# Patient Record
Sex: Female | Born: 1980
Health system: Southern US, Community
[De-identification: ages and names within clinical notes are randomized; demographics above are authoritative.]

## PROBLEM LIST (undated history)

## (undated) DIAGNOSIS — B999 Unspecified infectious disease: Secondary | ICD-10-CM

## (undated) DIAGNOSIS — N83209 Unspecified ovarian cyst, unspecified side: Secondary | ICD-10-CM

## (undated) DIAGNOSIS — G8929 Other chronic pain: Secondary | ICD-10-CM

## (undated) DIAGNOSIS — I10 Essential (primary) hypertension: Secondary | ICD-10-CM

## (undated) DIAGNOSIS — I499 Cardiac arrhythmia, unspecified: Secondary | ICD-10-CM

## (undated) DIAGNOSIS — F32A Depression, unspecified: Secondary | ICD-10-CM

## (undated) DIAGNOSIS — O009 Unspecified ectopic pregnancy without intrauterine pregnancy: Secondary | ICD-10-CM

## (undated) DIAGNOSIS — I351 Nonrheumatic aortic (valve) insufficiency: Secondary | ICD-10-CM

## (undated) DIAGNOSIS — R51 Headache: Secondary | ICD-10-CM

## (undated) DIAGNOSIS — N390 Urinary tract infection, site not specified: Secondary | ICD-10-CM

## (undated) DIAGNOSIS — F419 Anxiety disorder, unspecified: Secondary | ICD-10-CM

## (undated) DIAGNOSIS — K219 Gastro-esophageal reflux disease without esophagitis: Secondary | ICD-10-CM

## (undated) DIAGNOSIS — J45909 Unspecified asthma, uncomplicated: Secondary | ICD-10-CM

## (undated) DIAGNOSIS — M549 Dorsalgia, unspecified: Secondary | ICD-10-CM

## (undated) DIAGNOSIS — R519 Headache, unspecified: Secondary | ICD-10-CM

## (undated) DIAGNOSIS — F329 Major depressive disorder, single episode, unspecified: Secondary | ICD-10-CM

## (undated) HISTORY — DX: Unspecified asthma, uncomplicated: J45.909

## (undated) HISTORY — PX: CLEFT PALATE REPAIR: SUR1165

## (undated) HISTORY — DX: Nonrheumatic aortic (valve) insufficiency: I35.1

## (undated) HISTORY — PX: OTHER SURGICAL HISTORY: SHX169

---

## 2009-12-05 HISTORY — PX: LAPAROSCOPY FOR ECTOPIC PREGNANCY: SUR765

## 2011-03-27 ENCOUNTER — Emergency Department (HOSPITAL_COMMUNITY): Payer: Medicaid Other

## 2011-03-27 ENCOUNTER — Emergency Department (HOSPITAL_COMMUNITY)
Admission: EM | Admit: 2011-03-27 | Discharge: 2011-03-27 | Disposition: A | Discharge status: Home / Self Care | Payer: Medicaid Other | Attending: Emergency Medicine | Admitting: Emergency Medicine

## 2011-03-27 DIAGNOSIS — O219 Vomiting of pregnancy, unspecified: Secondary | ICD-10-CM | POA: Insufficient documentation

## 2011-03-27 DIAGNOSIS — O209 Hemorrhage in early pregnancy, unspecified: Secondary | ICD-10-CM | POA: Insufficient documentation

## 2011-03-27 DIAGNOSIS — O99891 Other specified diseases and conditions complicating pregnancy: Secondary | ICD-10-CM | POA: Insufficient documentation

## 2011-03-27 DIAGNOSIS — R197 Diarrhea, unspecified: Secondary | ICD-10-CM | POA: Insufficient documentation

## 2011-03-27 DIAGNOSIS — R109 Unspecified abdominal pain: Secondary | ICD-10-CM | POA: Insufficient documentation

## 2011-03-27 LAB — CBC
MCH: 32.6 pg (ref 26.0–34.0)
MCHC: 36.2 g/dL — ABNORMAL HIGH (ref 30.0–36.0)
Platelets: 297 10*3/uL (ref 150–400)
RBC: 4.51 MIL/uL (ref 3.87–5.11)
RDW: 13.4 % (ref 11.5–15.5)

## 2011-03-27 LAB — DIFFERENTIAL
Basophils Relative: 0 % (ref 0–1)
Eosinophils Absolute: 0 10*3/uL (ref 0.0–0.7)
Monocytes Relative: 6 % (ref 3–12)
Neutrophils Relative %: 79 % — ABNORMAL HIGH (ref 43–77)

## 2011-03-27 LAB — ABO/RH: ABO/RH(D): O POS

## 2011-03-27 LAB — URINALYSIS, ROUTINE W REFLEX MICROSCOPIC
Bilirubin Urine: NEGATIVE
Ketones, ur: >80 mg/dL — AB
Nitrite: NEGATIVE
Protein, ur: NEGATIVE mg/dL
Specific Gravity, Urine: 1.026 (ref 1.005–1.030)
Urobilinogen, UA: 0.2 mg/dL (ref 0.0–1.0)

## 2011-03-27 LAB — WET PREP, GENITAL: Yeast Wet Prep HPF POC: NONE SEEN

## 2011-03-27 LAB — POCT PREGNANCY, URINE: Preg Test, Ur: POSITIVE

## 2011-03-27 LAB — HCG, QUANTITATIVE, PREGNANCY: hCG, Beta Chain, Quant, S: 445 m[IU]/mL — ABNORMAL HIGH (ref <5)

## 2011-03-27 LAB — POCT I-STAT, CHEM 8
Calcium, Ion: 1.14 mmol/L (ref 1.12–1.32)
HCT: 48 % — ABNORMAL HIGH (ref 36.0–46.0)
TCO2: 23 mmol/L (ref 0–100)

## 2011-03-28 ENCOUNTER — Ambulatory Visit (HOSPITAL_COMMUNITY)
Admission: AD | Admit: 2011-03-28 | Discharge: 2011-03-28 | Disposition: A | Discharge status: Home / Self Care | Payer: Medicaid Other | Source: Ambulatory Visit | Attending: Obstetrics and Gynecology | Admitting: Obstetrics and Gynecology

## 2011-03-28 ENCOUNTER — Inpatient Hospital Stay (HOSPITAL_COMMUNITY): Payer: Medicaid Other

## 2011-03-28 ENCOUNTER — Other Ambulatory Visit: Payer: Self-pay | Admitting: Obstetrics and Gynecology

## 2011-03-28 DIAGNOSIS — N83209 Unspecified ovarian cyst, unspecified side: Secondary | ICD-10-CM | POA: Insufficient documentation

## 2011-03-28 DIAGNOSIS — O00109 Unspecified tubal pregnancy without intrauterine pregnancy: Secondary | ICD-10-CM | POA: Insufficient documentation

## 2011-03-28 LAB — COMPREHENSIVE METABOLIC PANEL
Albumin: 4.3 g/dL (ref 3.5–5.2)
Alkaline Phosphatase: 54 U/L (ref 39–117)
BUN: 3 mg/dL — ABNORMAL LOW (ref 6–23)
Calcium: 9.4 mg/dL (ref 8.4–10.5)
Potassium: 3.6 mEq/L (ref 3.5–5.1)
Sodium: 135 mEq/L (ref 135–145)
Total Protein: 7.8 g/dL (ref 6.0–8.3)

## 2011-03-28 LAB — RAPID URINE DRUG SCREEN, HOSP PERFORMED
Amphetamines: NOT DETECTED
Barbiturates: NOT DETECTED
Benzodiazepines: NOT DETECTED
Cocaine: NOT DETECTED

## 2011-03-28 LAB — CBC
MCHC: 34.6 g/dL (ref 30.0–36.0)
MCV: 91.6 fL (ref 78.0–100.0)
Platelets: 299 10*3/uL (ref 150–400)
RDW: 13.6 % (ref 11.5–15.5)
WBC: 10.5 10*3/uL (ref 4.0–10.5)

## 2011-03-28 LAB — GC/CHLAMYDIA PROBE AMP, GENITAL: Chlamydia, DNA Probe: NEGATIVE

## 2011-04-19 NOTE — Op Note (Signed)
NAMEBRITTENY, [name] NO.:  [id number]  MEDICAL RECORD NO.:  [id number]           PATIENT TYPE:  O  LOCATION:  [facility]                          [facility]  PHYSICIAN:  Gerald Leitz, MD          DATE OF BIRTH:  01/28/1981  DATE OF PROCEDURE:  03/28/2011 DATE OF DISCHARGE:                              OPERATIVE REPORT   PREOPERATIVE DIAGNOSIS:  Suspected right ectopic pregnancy.  POSTOPERATIVE DIAGNOSIS:  Suspected right ectopic pregnancy plus right paraovarian cyst.  PROCEDURE:  Laparoscopic right salpingectomy and removal of the right paraovarian cyst.  SURGEON:  Gerald Leitz, MD  ASSISTANT:  Patsy Baltimore, MD  ANESTHESIA:  General.  FINDINGS:  Complex right paraovarian cyst and dilated right fallopian tube with suspected ectopic tissue.  SPECIMEN:  Suspect ectopic tissue from the right fallopian tube and right paraovarian cyst.  DISPOSITION OF SPECIMEN:  Pathology.  ESTIMATED BLOOD LOSS:  100 mL.  COMPLICATIONS:  None.  PROCEDURE IN DETAIL:  The patient was taken to the operating room where she was placed under general anesthesia.  She was placed in the dorsal lithotomy position.  She was prepped and draped in the usual sterile fashion.  Speculum was placed in the vaginal vault and the anterior lip of the cervix was grasped with single-tooth tenaculum and a uterine manipulator was replaced without difficult.  The tenaculum was removed. Foley catheter was placed.  Attention was turned to the abdomen where a 5-mm incision was made at the umbilicus after being injected with approximately 7 mL of 0.25% Marcaine plain.  A 5-mm trocar was placed under direct visualization.  Pneumoperitoneum was achieved with CO2 gas. The abdomen was examined with the findings noted above.  Attention was turned to the left lower quadrant where a 10-mm incision was made with a scalpel and a 10-mm trocar was placed under direct visualization. Attention was turned to the  right lower quadrant where 5-mm incision was made and a 5-mm trocar was placed under direct visualization.  The right fallopian tube was grasped with an atraumatic grasper and incised linearly with laparoscopic scissors using electrocautery.  The contents of the fallopian tube were removed with a grasper and sent to pathology. Hemostasis of the right fallopian tube was achieved with bipolar scissors as well as Kleppinger.  Attention was turned to the right paraovarian cyst which was excised at its base with Harmonic scalpel. It was then placed into an EndoCatch bag and removed through the 10-mm trocar.  The abdomen was copiously irrigated and all incisions were hemostatic.  The 10-mm trocar was removed under direct visualization followed by the 5-mm trocar which was in the right lower quadrant. Pneumoperitoneum was released and the umbilical trocar was removed under direct visualization.  The 10-mm incision site, the fascia was reapproximated with 0-Vicryl, skin was closed with 4-0 Vicryl, and the umbilical site was closed with 4-0 Vicryl.  Dermabond was placed over the 5-mm incision in the right lower quadrant as well as the umbilical incision and the 10-mm incision.  The uterine manipulator was removed. The patient was awakened from anesthesia, taken to recovery room, and awakened  in stable condition.  Sponge, lap, and needle counts were correct x2.     Gerald Leitz, MD     TC/MEDQ  D:  03/28/2011  T:  03/29/2011  Job:  [id number]  Electronically Signed by Gerald Leitz MD on 04/19/2011 09:56:41 AM

## 2011-08-17 ENCOUNTER — Emergency Department (HOSPITAL_COMMUNITY)
Admission: EM | Admit: 2011-08-17 | Discharge: 2011-08-17 | Disposition: A | Discharge status: Home / Self Care | Payer: Medicaid Other | Attending: Emergency Medicine | Admitting: Emergency Medicine

## 2011-08-17 DIAGNOSIS — R51 Headache: Secondary | ICD-10-CM | POA: Insufficient documentation

## 2011-08-17 DIAGNOSIS — B9789 Other viral agents as the cause of diseases classified elsewhere: Secondary | ICD-10-CM | POA: Insufficient documentation

## 2011-08-17 DIAGNOSIS — J069 Acute upper respiratory infection, unspecified: Secondary | ICD-10-CM | POA: Insufficient documentation

## 2011-08-17 DIAGNOSIS — R05 Cough: Secondary | ICD-10-CM | POA: Insufficient documentation

## 2011-08-17 DIAGNOSIS — R059 Cough, unspecified: Secondary | ICD-10-CM | POA: Insufficient documentation

## 2011-08-17 DIAGNOSIS — H9209 Otalgia, unspecified ear: Secondary | ICD-10-CM | POA: Insufficient documentation

## 2011-11-24 ENCOUNTER — Emergency Department (HOSPITAL_COMMUNITY)
Admission: EM | Admit: 2011-11-24 | Discharge: 2011-11-25 | Disposition: A | Discharge status: Home / Self Care | Payer: Medicaid Other | Attending: Emergency Medicine | Admitting: Emergency Medicine

## 2011-11-24 ENCOUNTER — Encounter: Payer: Self-pay | Admitting: Emergency Medicine

## 2011-11-24 DIAGNOSIS — R197 Diarrhea, unspecified: Secondary | ICD-10-CM | POA: Insufficient documentation

## 2011-11-24 DIAGNOSIS — R1110 Vomiting, unspecified: Secondary | ICD-10-CM | POA: Insufficient documentation

## 2011-11-24 DIAGNOSIS — K029 Dental caries, unspecified: Secondary | ICD-10-CM | POA: Insufficient documentation

## 2011-11-24 DIAGNOSIS — F17200 Nicotine dependence, unspecified, uncomplicated: Secondary | ICD-10-CM | POA: Insufficient documentation

## 2011-11-24 DIAGNOSIS — K047 Periapical abscess without sinus: Secondary | ICD-10-CM

## 2011-11-24 NOTE — ED Notes (Signed)
PT. REPORTS PERSISTENT VOMITTING WITH DIARRHEA ONSET TODAY , DENIES ABDOMINAL PAIN , CHILLS WITH NO FEVER .

## 2011-11-25 LAB — URINALYSIS, ROUTINE W REFLEX MICROSCOPIC
Bilirubin Urine: NEGATIVE
Glucose, UA: NEGATIVE mg/dL
Ketones, ur: NEGATIVE mg/dL
Nitrite: NEGATIVE
Specific Gravity, Urine: 1.025 (ref 1.005–1.030)
pH: 7.5 (ref 5.0–8.0)

## 2011-11-25 LAB — URINE MICROSCOPIC-ADD ON

## 2011-11-25 LAB — POCT PREGNANCY, URINE: Preg Test, Ur: NEGATIVE

## 2011-11-25 MED ORDER — ONDANSETRON HCL 4 MG/2ML IJ SOLN
4.0000 mg | Freq: Once | INTRAMUSCULAR | Status: AC
  Administered 2011-11-25: 4 mg via INTRAVENOUS

## 2011-11-25 MED ORDER — PENICILLIN V POTASSIUM 250 MG PO TABS: 250.0000 mg | ORAL_TABLET | Freq: Four times a day (QID) | ORAL | Status: AC

## 2011-11-25 MED ORDER — SODIUM CHLORIDE 0.9 % IV BOLUS (SEPSIS)
1000.0000 mL | Freq: Once | INTRAVENOUS | Status: AC
  Administered 2011-11-25: 1000 mL via INTRAVENOUS

## 2011-11-25 MED ORDER — ONDANSETRON HCL 4 MG PO TABS: 4.0000 mg | ORAL_TABLET | Freq: Four times a day (QID) | ORAL | Status: AC

## 2011-11-25 MED ORDER — ONDANSETRON HCL 4 MG/2ML IJ SOLN
4.0000 mg | Freq: Once | INTRAMUSCULAR | Status: AC
  Administered 2011-11-25: 4 mg via INTRAVENOUS
  Filled 2011-11-25: qty 2

## 2011-11-25 NOTE — ED Provider Notes (Addendum)
History     CSN: [id number]  Arrival date & time 11/24/11  2230   First MD Initiated Contact with Patient 11/25/11 0104      Chief Complaint  Patient presents with  . Emesis    (Consider location/radiation/quality/duration/timing/severity/associated sxs/prior treatment) Patient is a 30 y.o. female presenting with vomiting. The history is provided by the patient.  Emesis  This is a new problem. The current episode started 3 to 5 hours ago. The problem occurs more than 10 times per day. The problem has not changed since onset.The emesis has an appearance of stomach contents (Mild streaks of blood). There has been no fever. Associated symptoms include diarrhea. Pertinent negatives include no abdominal pain, no chills, no cough, no fever, no myalgias and no URI. Risk factors: No ill contacts or suspected bad food exposure.    History reviewed. No pertinent past medical history.  History reviewed. No pertinent past surgical history.  No family history on file.  History  Substance Use Topics  . Smoking status: Current Everyday Smoker  . Smokeless tobacco: Not on file  . Alcohol Use: Yes     OCCASIONAL    OB History    Grav Para Term Preterm Abortions TAB SAB Ect Mult Living                  Review of Systems  Constitutional: Negative for fever and chills.  Respiratory: Negative for cough.   Gastrointestinal: Positive for vomiting and diarrhea. Negative for abdominal pain.  Musculoskeletal: Negative for myalgias.  All other systems reviewed and are negative.    Allergies  Review of patient's allergies indicates no known allergies.  Home Medications   Current Outpatient Rx  Name Route Sig Dispense Refill  . OVER THE COUNTER MEDICATION Oral Take 1 tablet by mouth daily as needed. For reflux *over the counter acid reducer       BP 126/88  Pulse 82  Temp(Src) 97.9 F (36.6 C) (Oral)  Resp 16  SpO2 100%  LMP 11/20/2011  Physical Exam  Nursing note and vitals  reviewed. Constitutional: She is oriented to person, place, and time. She appears well-developed and well-nourished. No distress.  HENT:  Head: Normocephalic and atraumatic.  Mouth/Throat: Mucous membranes are dry. Dental abscesses and dental caries present.    Eyes: EOM are normal. Pupils are equal, round, and reactive to light.  Cardiovascular: Normal rate, regular rhythm, normal heart sounds and intact distal pulses.  Exam reveals no friction rub.   No murmur heard. Pulmonary/Chest: Effort normal and breath sounds normal. She has no wheezes. She has no rales.  Abdominal: Soft. Bowel sounds are normal. She exhibits no distension. There is no tenderness. There is no rebound and no guarding.  Musculoskeletal: Normal range of motion. She exhibits no tenderness.       No edema  Neurological: She is alert and oriented to person, place, and time. No cranial nerve deficit.  Skin: Skin is warm and dry. No rash noted.  Psychiatric: She has a normal mood and affect. Her behavior is normal.    ED Course  Procedures (including critical care time)  Labs Reviewed  URINALYSIS, ROUTINE W REFLEX MICROSCOPIC - Abnormal; Notable for the following:    APPearance CLOUDY (*)    Hgb urine dipstick MODERATE (*)    All other components within normal limits  URINE MICROSCOPIC-ADD ON - Abnormal; Notable for the following:    Squamous Epithelial / LPF FEW (*)    Bacteria, UA FEW (*)  All other components within normal limits  POCT PREGNANCY, URINE  POCT PREGNANCY, URINE   No results found.   No diagnosis found.    MDM  Pt with symptoms most consistent with a viral process with fever/vomitting/diarrhea.  Denies bad food exposure and recent travel out of the country.  No recent abx.  No hx concerning for GU pathology or kidney stones.  Pt is awake and alert on exam without peritoneal signs. After IV fluids and Zofran patient feeling better and tolerating by mouth's. Will discharge home. Patient  also has a right lower dental abscess and will cover with penicillin and refer to dentistry          Gwyneth Sprout, MD 11/25/11 1610  Gwyneth Sprout, MD 11/25/11 [phone]

## 2011-12-06 DIAGNOSIS — I499 Cardiac arrhythmia, unspecified: Secondary | ICD-10-CM

## 2011-12-06 HISTORY — DX: Cardiac arrhythmia, unspecified: I49.9

## 2012-01-31 ENCOUNTER — Emergency Department (HOSPITAL_COMMUNITY): Payer: Medicaid Other

## 2012-01-31 ENCOUNTER — Encounter (HOSPITAL_COMMUNITY): Payer: Self-pay

## 2012-01-31 ENCOUNTER — Emergency Department (HOSPITAL_COMMUNITY)
Admission: EM | Admit: 2012-01-31 | Discharge: 2012-01-31 | Disposition: A | Discharge status: Home / Self Care | Payer: Medicaid Other | Attending: Emergency Medicine | Admitting: Emergency Medicine

## 2012-01-31 DIAGNOSIS — R141 Gas pain: Secondary | ICD-10-CM | POA: Insufficient documentation

## 2012-01-31 DIAGNOSIS — R142 Eructation: Secondary | ICD-10-CM | POA: Insufficient documentation

## 2012-01-31 DIAGNOSIS — K044 Acute apical periodontitis of pulpal origin: Secondary | ICD-10-CM | POA: Insufficient documentation

## 2012-01-31 DIAGNOSIS — K047 Periapical abscess without sinus: Secondary | ICD-10-CM

## 2012-01-31 DIAGNOSIS — K439 Ventral hernia without obstruction or gangrene: Secondary | ICD-10-CM

## 2012-01-31 MED ORDER — TRAMADOL HCL 50 MG PO TABS: 50.0000 mg | ORAL_TABLET | Freq: Four times a day (QID) | ORAL | Status: AC | PRN

## 2012-01-31 MED ORDER — TRAMADOL HCL 50 MG PO TABS
50.0000 mg | ORAL_TABLET | Freq: Once | ORAL | Status: AC
  Administered 2012-01-31: 50 mg via ORAL
  Filled 2012-01-31: qty 1

## 2012-01-31 MED ORDER — PENICILLIN V POTASSIUM 500 MG PO TABS: 500.0000 mg | ORAL_TABLET | Freq: Four times a day (QID) | ORAL | Status: AC

## 2012-01-31 NOTE — ED Notes (Signed)
Pt also sts her belly has never been this distented LBM this morning. Passing flatus. No Nausea vomiting.

## 2012-01-31 NOTE — ED Notes (Signed)
Pt report dental pain x months with no relief from antibiotic that was prescribed here. Also c/o possible hernia, reports a nodule in her lower abdomen x 6 month to 1 year. No abd pain.

## 2012-01-31 NOTE — ED Provider Notes (Signed)
Medical screening examination/treatment/procedure(s) were performed by non-physician practitioner and as supervising physician I was immediately available for consultation/collaboration.  Flint Melter, MD 01/31/12 2212

## 2012-01-31 NOTE — Discharge Instructions (Signed)
Dental Caries  Tooth decay (dental caries, cavities) is the most common of all oral diseases. It occurs in all ages but is more common in children and young adults.  CAUSES  Bacteria in your mouth combine with foods (particularly sugars and starches) to produce plaque. Plaque is a substance that sticks to the hard surfaces of teeth. The bacteria in the plaque produce acids that attack the enamel of teeth. Repeated acid attacks dissolve the enamel and create holes in the teeth. Root surfaces of teeth may also get these holes.  Other contributing factors include:   Frequent snacking and drinking of cavity-producing foods and liquids.   Poor oral hygiene.   Dry mouth.   Substance abuse such as methamphetamine.   Broken or poor fitting dental restorations.   Eating disorders.   Gastroesophageal reflux disease (GERD).   Certain radiation treatments to the head and neck.  SYMPTOMS  At first, dental decay appears as white, chalky areas on the enamel. In this early stage, symptoms are seldom present. As the decay progresses, pits and holes may appear on the enamel surfaces. Progression of the decay will lead to softening of the hard layers of the tooth. At this point you may experience some pain or achy feeling after sweet, hot, or cold foods or drinks are consumed. If left untreated, the decay will reach the internal structures of the tooth and produce severe pain. Extensive dental treatment, such as root canal therapy, may be needed to save the tooth at this late stage of decay development.  DIAGNOSIS  Most cavities will be detected during regular check-ups. A thorough medical and dental history will be taken by the dentist. The dentist will use instruments to check the surfaces of your teeth for any breakdown or discoloration. Some dentists have special instruments, such as lasers, that detect tooth decay. Dental X-rays may also show some cavities that are not visible to the eye (such as between  the contact areas of the teeth). TREATMENT  Treatment involves removal of the tooth decay and replacement with a restorative material such as silver, gold, or composite (white) material. However, if the decay involves a large area of the tooth and there is little remaining healthy tooth structure, a cap (crown) will be fitted over the remaining structure. If the decay involves the center part of the tooth (pulp), root canal treatment will be needed before any type of dental restoration is placed. If the tooth is severely destroyed by the decay process, leaving the remaining tooth structures unrestorable, the tooth will need to be pulled (extracted). Some early tooth decay may be reversed by fluoride treatments and thorough brushing and flossing at home. PREVENTION   Eat healthy foods. Restrict the amount of sugary, starchy foods and liquids you consume. Avoid frequent snacking and drinking of unhealthy foods and liquids.   Sealants can help with prevention of cavities. Sealants are composite resins applied onto the biting surfaces of teeth at risk for decay. They smooth out the pits and grooves and prevent food from being trapped in them. This is done in early childhood before tooth decay has started.   Fluoride tablets may also be prescribed to children between 6 months and 10 years of age if your drinking water is not fluoridated. The fluoride absorbed by the tooth enamel makes teeth less susceptible to decay. Thorough daily cleaning with a toothbrush and dental floss is the best way to prevent cavities. Use of a fluoride toothpaste is highly recommended. Fluoride mouth rinses   may be used in specific cases.   Topical application of fluoride by your dentist is important in children.   Regular visits with a dentist for checkups and cleanings are also important.  SEEK IMMEDIATE DENTAL CARE IF:  You have a fever.   You develop redness and swelling of your face, jaw, or neck.   You develop swelling  around a tooth.   You are unable to open your mouth or cannot swallow.   You have severe pain uncontrolled by pain medicine.  Document Released: 08/13/2002 Document Revised: 08/03/2011 Document Reviewed: 04/28/2011 [facility] Patient Information 2012 [place], [place].         Hernia A hernia occurs when an internal organ pushes out through a weak spot in the abdominal wall. Hernias most commonly occur in the groin and around the navel. Hernias often can be pushed back into place (reduced). Most hernias tend to get worse over time. Some abdominal hernias can get stuck in the opening (irreducible or incarcerated hernia) and cannot be reduced. An irreducible abdominal hernia which is tightly squeezed into the opening is at risk for impaired blood supply (strangulated hernia). A strangulated hernia is a medical emergency. Because of the risk for an irreducible or strangulated hernia, surgery may be recommended to repair a hernia. CAUSES   Heavy lifting.   Prolonged coughing.   Straining to have a bowel movement.   A cut (incision) made during an abdominal surgery.  HOME CARE INSTRUCTIONS   Bed rest is not required. You may continue your normal activities.   Avoid lifting more than 10 pounds (4.5 kg) or straining.   Cough gently. If you are a smoker it is best to stop. Even the best hernia repair can break down with the continual strain of coughing. Even if you do not have your hernia repaired, a cough will continue to aggravate the problem.   Do not wear anything tight over your hernia. Do not try to keep it in with an outside bandage or truss. These can damage abdominal contents if they are trapped within the hernia sac.   Eat a normal diet.   Avoid constipation. Straining over long periods of time will increase hernia size and encourage breakdown of repairs. If you cannot do this with diet alone, stool softeners may be used.  SEEK IMMEDIATE MEDICAL CARE IF:   You have a fever.    You develop increasing abdominal pain.   You feel nauseous or vomit.   Your hernia is stuck outside the abdomen, looks discolored, feels hard, or is tender.   You have any changes in your bowel habits or in the hernia that are unusual for you.   You have increased pain or swelling around the hernia.   You cannot push the hernia back in place by applying gentle pressure while lying down.  MAKE SURE YOU:   Understand these instructions.   Will watch your condition.   Will get help right away if you are not doing well or get worse.  Document Released: 11/21/2005 Document Revised: 08/03/2011 Document Reviewed: 07/10/2008 [facility] Patient Information 2012 [place], [place].      RESOURCE GUIDE  Dental Problems  Patients with Medicaid: [facility] [phone] [address]  [address] Phone:  [phone]                                                  Phone:  [phone]  If unable to pay or uninsured, contact:  [facility] or [facility]. to become qualified for the adult dental clinic.  Chronic Pain Problems Contact [facility]  [phone] Patients need to be referred by their primary care doctor.  Insufficient Money for Medicine Contact [employer]:  call "211" or [facility] [phone].  No Primary Care Doctor Call [facility]  [phone] Other agencies that provide inexpensive medical care    [facility]  [phone]    [facility]  [phone]    [facility]  [phone]    [facility]  [phone]    [facility]  [phone]    [facility]  [phone]  Psychological Services [facility] Behavioral Health  [phone] [facility] Services  [phone] [facility] Mental Health   [phone] (emergency services 8575266122)  Substance Abuse Resources [facility]   [phone] [facility] [phone] [facility] [phone] Floydene Flock 5515778029 Residential & Outpatient Substance Abuse Program  [phone]  Abuse/Neglect [facility] Child Abuse Hotline [phone] 591-4746 [facility] Child Abuse Hotline (234)221-7613 (After Hours)  Emergency Shelter [facility] [phone]  Maternity Homes [facility] at the [employer] [facility] [place] [phone] [facility] [phone]  MRSA Hotline #:   470-149-5772    [facility] Resources  [facility]     [employer]                          [facility] Dept. [address] [place]                       [address]      [address]  Blondell Reveal Phone:  [phone]                                   Phone:  [phone]                 Phone:  [phone]  [facility] Mental Health Phone:  [phone]  [facility] Child Abuse Hotline 252-832-4443 [phone] (After Hours)

## 2012-01-31 NOTE — ED Provider Notes (Signed)
History     CSN: [id number]  Arrival date & time 01/31/12  1011   First MD Initiated Contact with Patient 01/31/12 1138      Chief Complaint  Patient presents with  . Dental Pain  . Hernia    (Consider location/radiation/quality/duration/timing/severity/associated sxs/prior treatment) The history is provided by the patient.  31 y/o F with cc of dental pain intermittent x several months, with current exacerbation starting several days ago. Pain to left lower dentition, constant, moderate intensity, sharp, non-radiating. No assoc fever, chills, ear pain, face swelling, neck pain, N/V. Tx in the past with abx with temporary resolution, but pt has been unable to see a dentist for definitive tx. Also c/o abd bloating for approx last 8 months. Some assoc constipation, last BM today and was small but normal for the last several months. Passing flatus. Denies assoc abdominal pain, N/V/D.  Does not think she is pregnant. There is an associated "spot" to the mid-abdomen that pt is concerned may be a hernia. No pain at time of examination. No prior eval or tx.  No past medical history on file.  Past Surgical History  Procedure Date  . Etopical surgery   . Cleft palate repair     No family history on file.  History  Substance Use Topics  . Smoking status: Current Everyday Smoker  . Smokeless tobacco: Not on file  . Alcohol Use: Yes     OCCASIONAL     Review of Systems 10 systems reviewed and are negative for acute change except as noted in the HPI.  Allergies  Review of patient's allergies indicates no known allergies.  Home Medications   Current Outpatient Rx  Name Route Sig Dispense Refill  . ACETAMINOPHEN 500 MG PO TABS Oral Take 1,000 mg by mouth every 6 (six) hours as needed. PAIN    . OVER THE COUNTER MEDICATION Oral Take 1 tablet by mouth daily as needed. For reflux *over the counter acid reducer       BP 137/82  Pulse 83  Temp(Src) 98.4 F (36.9 C) (Oral)  Resp 18   SpO2 99%  Physical Exam  Constitutional: She is oriented to person, place, and time. She appears well-developed and well-nourished. No distress.  HENT:  Head: Normocephalic and atraumatic.  Right Ear: External ear normal.  Left Ear: External ear normal.  Nose: Nose normal.  Mouth/Throat: Uvula is midline, oropharynx is clear and moist and mucous membranes are normal. Dental caries present. No dental abscesses or uvula swelling. No oropharyngeal exudate.       Generalized poor dentition with multiple teeth with dental caries. Bottom left 1st molar with tenderness to percussion, mild gingival erythema with edema, no abscess seen.  Eyes: Conjunctivae are normal. Pupils are equal, round, and reactive to light.  Neck: Normal range of motion. Neck supple.  Cardiovascular: Normal rate and regular rhythm.   Pulmonary/Chest: Effort normal. No respiratory distress.  Abdominal: Soft. Bowel sounds are normal. Distention: slight distension. There is no tenderness. There is no rebound and no guarding.    Musculoskeletal: She exhibits no edema and no tenderness.  Lymphadenopathy:    She has no cervical adenopathy.  Neurological: She is alert and oriented to person, place, and time.  Skin: Skin is warm and dry. No rash noted.  Psychiatric: She has a normal mood and affect.    ED Course  Procedures (including critical care time)  Labs Reviewed - No data to display Dg Abd Acute W/chest  01/31/2012  *  RADIOLOGY REPORT*  Clinical Data: Bloating, constipation  ACUTE ABDOMEN SERIES (ABDOMEN 2 VIEW & CHEST 1 VIEW)  Comparison: None.  Findings: The lungs are clear and somewhat hyperaerated.  There appears to be a variation of a right-sided aortic arch present. The stomach appears to be normally positioned in the left upper quadrant.  Mediastinal contours are normal.  The heart is within normal limits in size.  Supine and erect views of the abdomen show no bowel obstruction. No free air is seen.  No opaque  calculi are noted.  IMPRESSION:  1.  No active lung disease.  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 2.  No bowel obstruction.  No free air.  Original Report Authenticated By: Juline Patch, M.D.     Dx 1: Dental infection Dx 2: Ventral hernia   MDM  Dental pain. No abscess seen. Suspect early infection. No s/s ludwig angina. Will tx with PCN and give pain medication (ultram to avoid constipation worsening).  Abd bloating, chronic. No pain. Small reducible ventral hernia. Abd otherwise benign. Suspect weight gain as true cause of "bloating" as it is uniform across the mid-section. Neg pregnancy test. Cathlean Sauer reviewed, no acute findings.  Will d/c home.        Shaaron Adler, [place] 01/31/12 1345

## 2012-01-31 NOTE — Progress Notes (Signed)
CM spoke with pt who confirms she does not have a pcp but has "limited medicaid".  Discussed the importance of a pcp for f/u. Reviewed [facility] number to assist with finding Medicaid self pay provider close. Pt voiced understanding and appreciation of resources provided

## 2012-02-22 ENCOUNTER — Encounter (HOSPITAL_COMMUNITY): Payer: Self-pay | Admitting: Adult Health

## 2012-02-22 ENCOUNTER — Emergency Department (HOSPITAL_COMMUNITY)
Admission: EM | Admit: 2012-02-22 | Discharge: 2012-02-23 | Disposition: A | Discharge status: Home / Self Care | Payer: Medicaid Other | Attending: Emergency Medicine | Admitting: Emergency Medicine

## 2012-02-22 DIAGNOSIS — F17200 Nicotine dependence, unspecified, uncomplicated: Secondary | ICD-10-CM | POA: Insufficient documentation

## 2012-02-22 DIAGNOSIS — H6690 Otitis media, unspecified, unspecified ear: Secondary | ICD-10-CM | POA: Insufficient documentation

## 2012-02-22 NOTE — ED Notes (Signed)
C/o ear infection and drainage, inability to hear out of ear for one day.

## 2012-02-23 MED ORDER — OXYCODONE-ACETAMINOPHEN 5-325 MG PO TABS
2.0000 | ORAL_TABLET | Freq: Once | ORAL | Status: AC
  Administered 2012-02-23: 2 via ORAL
  Filled 2012-02-23: qty 2

## 2012-02-23 MED ORDER — OXYCODONE-ACETAMINOPHEN 5-325 MG PO TABS: 1.0000 | ORAL_TABLET | Freq: Four times a day (QID) | ORAL | Status: AC | PRN

## 2012-02-23 MED ORDER — AMOXICILLIN 500 MG PO CAPS
500.0000 mg | ORAL_CAPSULE | Freq: Once | ORAL | Status: AC
  Administered 2012-02-23: 500 mg via ORAL
  Filled 2012-02-23: qty 1

## 2012-02-23 MED ORDER — AMOXICILLIN 500 MG PO CAPS: 500.0000 mg | ORAL_CAPSULE | Freq: Three times a day (TID) | ORAL | Status: AC

## 2012-02-23 NOTE — Discharge Instructions (Signed)
Otitis Media, Adult  A middle ear infection is an infection in the space behind the eardrum. The medical name for this is "otitis media." It may happen after a common cold. It is caused by a germ that starts growing in that space. You may feel swollen glands in your neck on the side of the ear infection.  HOME CARE INSTRUCTIONS   · Take your medicine as directed until it is gone, even if you feel better after the first few days.  · Only take over-the-counter or prescription medicines for pain, discomfort, or fever as directed by your caregiver.  · Occasional use of a nasal decongestant a couple times per day may help with discomfort and help the eustachian tube to drain better.  Follow up with your caregiver in 10 to 14 days or as directed, to be certain that the infection has cleared. Not keeping the appointment could result in a chronic or permanent injury, pain, hearing loss and disability. If there is any problem keeping the appointment, you must call back to this facility for assistance.  SEEK IMMEDIATE MEDICAL CARE IF:   · You are not getting better in 2 to 3 days.  · You have pain that is not controlled with medication.  · You feel worse instead of better.  · You cannot use the medication as directed.  · You develop swelling, redness or pain around the ear or stiffness in your neck.  MAKE SURE YOU:   · Understand these instructions.  · Will watch your condition.  · Will get help right away if you are not doing well or get worse.  Document Released: 08/26/2004 Document Revised: 11/10/2011 Document Reviewed: 06/27/2008  [employer]® Patient Information ©2012 [employer].

## 2012-02-23 NOTE — ED Provider Notes (Signed)
History     CSN: [id number]  Arrival date & time 02/22/12  2042   First MD Initiated Contact with Patient 02/23/12 0102      Chief Complaint  Patient presents with  . Otitis Media    (Consider location/radiation/quality/duration/timing/severity/associated sxs/prior treatment) Patient is a 31 y.o. female presenting with ear pain. The history is provided by the patient.  Otalgia This is a new problem. The current episode started yesterday. There is pain in the right ear. The problem occurs constantly. The problem has been gradually worsening. There has been no fever. The pain is severe. Associated symptoms include hearing loss and sore throat. Pertinent negatives include no ear discharge.    History reviewed. No pertinent past medical history.  Past Surgical History  Procedure Date  . Etopical surgery   . Cleft palate repair     History reviewed. No pertinent family history.  History  Substance Use Topics  . Smoking status: Current Everyday Smoker  . Smokeless tobacco: Not on file  . Alcohol Use: Yes     OCCASIONAL    OB History    Grav Para Term Preterm Abortions TAB SAB Ect Mult Living                  Review of Systems  HENT: Positive for hearing loss, ear pain and sore throat. Negative for ear discharge.   All other systems reviewed and are negative.    Allergies  Review of patient's allergies indicates no known allergies.  Home Medications   Current Outpatient Rx  Name Route Sig Dispense Refill  . ACETAMINOPHEN 500 MG PO TABS Oral Take 1,000 mg by mouth every 6 (six) hours as needed. PAIN    . OVER THE COUNTER MEDICATION Oral Take 1 tablet by mouth daily as needed. For reflux *over the counter acid reducer       BP 130/95  Pulse 104  Temp 98.9 F (37.2 C)  Resp 18  SpO2 100%  LMP 01/26/2012  Physical Exam  Nursing note and vitals reviewed. Constitutional: She is oriented to person, place, and time. She appears well-developed and  well-nourished.  HENT:  Head: Normocephalic and atraumatic.  Left Ear: External ear normal.  Mouth/Throat: Oropharynx is clear and moist.       The right tm is markedly erythematous and swollen  Neck: Normal range of motion. Neck supple.  Neurological: She is alert and oriented to person, place, and time.  Skin: Skin is warm and dry.    ED Course  Procedures (including critical care time)  Labs Reviewed - No data to display No results found.   No diagnosis found.    MDM          Geoffery Lyons, MD 02/23/12 [phone]

## 2012-10-30 ENCOUNTER — Emergency Department (HOSPITAL_COMMUNITY)
Admission: EM | Admit: 2012-10-30 | Discharge: 2012-10-30 | Disposition: A | Discharge status: Home / Self Care | Payer: Medicaid Other | Attending: Emergency Medicine | Admitting: Emergency Medicine

## 2012-10-30 DIAGNOSIS — F17200 Nicotine dependence, unspecified, uncomplicated: Secondary | ICD-10-CM | POA: Insufficient documentation

## 2012-10-30 DIAGNOSIS — R220 Localized swelling, mass and lump, head: Secondary | ICD-10-CM | POA: Insufficient documentation

## 2012-10-30 DIAGNOSIS — Z87720 Personal history of (corrected) congenital malformations of eye: Secondary | ICD-10-CM | POA: Insufficient documentation

## 2012-10-30 DIAGNOSIS — R6884 Jaw pain: Secondary | ICD-10-CM | POA: Insufficient documentation

## 2012-10-30 DIAGNOSIS — K047 Periapical abscess without sinus: Secondary | ICD-10-CM | POA: Insufficient documentation

## 2012-10-30 MED ORDER — OXYCODONE-ACETAMINOPHEN 5-325 MG PO TABS
1.0000 | ORAL_TABLET | Freq: Once | ORAL | Status: AC
  Administered 2012-10-30: 1 via ORAL
  Filled 2012-10-30: qty 1

## 2012-10-30 MED ORDER — PENICILLIN V POTASSIUM 500 MG PO TABS: 500.0000 mg | ORAL_TABLET | Freq: Three times a day (TID) | ORAL | Status: DC

## 2012-10-30 MED ORDER — OXYCODONE-ACETAMINOPHEN 5-325 MG PO TABS: 1.0000 | ORAL_TABLET | ORAL | Status: DC | PRN

## 2012-10-30 MED ORDER — PENICILLIN V POTASSIUM 500 MG PO TABS
500.0000 mg | ORAL_TABLET | Freq: Once | ORAL | Status: AC
  Administered 2012-10-30: 500 mg via ORAL
  Filled 2012-10-30: qty 1

## 2012-10-30 NOTE — ED Provider Notes (Signed)
Medical screening examination/treatment/procedure(s) were performed by non-physician practitioner and as supervising physician I was immediately available for consultation/collaboration.  Coline Calkin, MD 10/30/12 2345 

## 2012-10-30 NOTE — ED Notes (Signed)
Pt states she has a ride home. 

## 2012-10-30 NOTE — ED Provider Notes (Signed)
History     CSN: [id number]  Arrival date & time 10/30/12  1741   First MD Initiated Contact with Patient 10/30/12 1744      Chief Complaint  Patient presents with  . Dental Pain    (Consider location/radiation/quality/duration/timing/severity/associated sxs/prior treatment) Patient is a 31 y.o. female presenting with tooth pain. The history is provided by the patient.  Dental PainThe primary symptoms include mouth pain. Primary symptoms do not include fever. The symptoms began 2 days ago. The symptoms are worsening. The symptoms are new.  Additional symptoms include: dental sensitivity to temperature, gum swelling, jaw pain and facial swelling.    No past medical history on file.  Past Surgical History  Procedure Date  . Etopical surgery   . Cleft palate repair     No family history on file.  History  Substance Use Topics  . Smoking status: Current Every Day Smoker  . Smokeless tobacco: Not on file  . Alcohol Use: Yes     Comment: OCCASIONAL    OB History    Grav Para Term Preterm Abortions TAB SAB Ect Mult Living                  Review of Systems  Constitutional: Negative for fever.  HENT: Positive for facial swelling and dental problem.   Gastrointestinal: Negative for nausea.    Allergies  Review of patient's allergies indicates no known allergies.  Home Medications   Current Outpatient Rx  Name  Route  Sig  Dispense  Refill  . ASPIRIN 500 MG PO TABS   Oral   Take 500 mg by mouth every 6 (six) hours as needed. Pain           BP 154/96  Pulse 100  Temp 98.8 F (37.1 C) (Oral)  Resp 17  SpO2 100%  Physical Exam  Constitutional: She is oriented to person, place, and time. She appears well-developed and well-nourished.  HENT:       Significant decay of lower rear left molar with adjacent swelling of gum and buccal surface. No pointing abscess or visualized drainage.   Neck: Normal range of motion.  Pulmonary/Chest: Effort normal.    Neurological: She is alert and oriented to person, place, and time.  Skin: Skin is warm and dry.    ED Course  Procedures (including critical care time)  Labs Reviewed - No data to display No results found.   No diagnosis found.  1. Dental abscess  MDM  Dental pain, suspect abscess.        Rodena Medin, PA-C 10/30/12 [phone]

## 2012-10-30 NOTE — ED Notes (Signed)
Pt states she has an infection inside her mouth on the L side. Pt states infection has been going on for a days. Pt states she a hole in her wisdom tooth on the L side of her mouth. Pt with no acute distress. Pt states she does not have dental coverage at present.

## 2012-12-15 ENCOUNTER — Inpatient Hospital Stay (HOSPITAL_COMMUNITY)
Admission: AD | Admit: 2012-12-15 | Discharge: 2012-12-15 | Disposition: A | Discharge status: Home / Self Care | Payer: Medicaid Other | Source: Ambulatory Visit | Attending: Obstetrics & Gynecology | Admitting: Obstetrics & Gynecology

## 2012-12-15 ENCOUNTER — Encounter (HOSPITAL_COMMUNITY): Payer: Self-pay | Admitting: Emergency Medicine

## 2012-12-15 ENCOUNTER — Encounter (HOSPITAL_COMMUNITY): Payer: Self-pay | Admitting: *Deleted

## 2012-12-15 ENCOUNTER — Emergency Department (HOSPITAL_COMMUNITY)
Admission: EM | Admit: 2012-12-15 | Discharge: 2012-12-15 | Disposition: A | Discharge status: Home / Self Care | Payer: Medicaid Other | Attending: Emergency Medicine | Admitting: Emergency Medicine

## 2012-12-15 DIAGNOSIS — X500XXA Overexertion from strenuous movement or load, initial encounter: Secondary | ICD-10-CM | POA: Insufficient documentation

## 2012-12-15 DIAGNOSIS — N912 Amenorrhea, unspecified: Secondary | ICD-10-CM

## 2012-12-15 DIAGNOSIS — Y92009 Unspecified place in unspecified non-institutional (private) residence as the place of occurrence of the external cause: Secondary | ICD-10-CM | POA: Insufficient documentation

## 2012-12-15 DIAGNOSIS — S39012A Strain of muscle, fascia and tendon of lower back, initial encounter: Secondary | ICD-10-CM

## 2012-12-15 DIAGNOSIS — N760 Acute vaginitis: Secondary | ICD-10-CM | POA: Insufficient documentation

## 2012-12-15 DIAGNOSIS — Z8742 Personal history of other diseases of the female genital tract: Secondary | ICD-10-CM | POA: Insufficient documentation

## 2012-12-15 DIAGNOSIS — Y9389 Activity, other specified: Secondary | ICD-10-CM | POA: Insufficient documentation

## 2012-12-15 DIAGNOSIS — N910 Primary amenorrhea: Secondary | ICD-10-CM

## 2012-12-15 DIAGNOSIS — A499 Bacterial infection, unspecified: Secondary | ICD-10-CM | POA: Insufficient documentation

## 2012-12-15 DIAGNOSIS — R6883 Chills (without fever): Secondary | ICD-10-CM | POA: Insufficient documentation

## 2012-12-15 DIAGNOSIS — X503XXA Overexertion from repetitive movements, initial encounter: Secondary | ICD-10-CM | POA: Insufficient documentation

## 2012-12-15 DIAGNOSIS — S335XXA Sprain of ligaments of lumbar spine, initial encounter: Secondary | ICD-10-CM | POA: Insufficient documentation

## 2012-12-15 DIAGNOSIS — Z87440 Personal history of urinary (tract) infections: Secondary | ICD-10-CM | POA: Insufficient documentation

## 2012-12-15 DIAGNOSIS — B9689 Other specified bacterial agents as the cause of diseases classified elsewhere: Secondary | ICD-10-CM

## 2012-12-15 DIAGNOSIS — I10 Essential (primary) hypertension: Secondary | ICD-10-CM | POA: Insufficient documentation

## 2012-12-15 DIAGNOSIS — F17200 Nicotine dependence, unspecified, uncomplicated: Secondary | ICD-10-CM | POA: Insufficient documentation

## 2012-12-15 DIAGNOSIS — G479 Sleep disorder, unspecified: Secondary | ICD-10-CM | POA: Insufficient documentation

## 2012-12-15 HISTORY — DX: Urinary tract infection, site not specified: N39.0

## 2012-12-15 HISTORY — DX: Unspecified ectopic pregnancy without intrauterine pregnancy: O00.90

## 2012-12-15 HISTORY — DX: Essential (primary) hypertension: I10

## 2012-12-15 HISTORY — DX: Unspecified ovarian cyst, unspecified side: N83.209

## 2012-12-15 LAB — WET PREP, GENITAL
Trich, Wet Prep: NONE SEEN
Yeast Wet Prep HPF POC: NONE SEEN

## 2012-12-15 MED ORDER — HYDROCODONE-ACETAMINOPHEN 5-325 MG PO TABS: 1.0000 | ORAL_TABLET | Freq: Four times a day (QID) | ORAL | Status: DC | PRN

## 2012-12-15 MED ORDER — DIAZEPAM 5 MG PO TABS: 5.0000 mg | ORAL_TABLET | Freq: Two times a day (BID) | ORAL | Status: DC

## 2012-12-15 MED ORDER — METRONIDAZOLE 500 MG PO TABS: 500.0000 mg | ORAL_TABLET | Freq: Three times a day (TID) | ORAL | Status: DC

## 2012-12-15 MED ORDER — METRONIDAZOLE 1 % EX GEL: Freq: Every day | CUTANEOUS | Status: DC

## 2012-12-15 MED ORDER — NAPROXEN 500 MG PO TABS: 500.0000 mg | ORAL_TABLET | Freq: Two times a day (BID) | ORAL | Status: DC

## 2012-12-15 NOTE — ED Notes (Signed)
Pt states she has a ride home. 

## 2012-12-15 NOTE — ED Notes (Addendum)
Pt ambulatory to exam room with steady gait. Pt reports low back pain primarily on R side. Pt denies recent trauma to back.

## 2012-12-15 NOTE — MAU Note (Signed)
Pt has not had period since 10/15. Pt stated took HPT first was positive and the second one was negative. Denies pain or cramping.

## 2012-12-15 NOTE — ED Notes (Addendum)
Pt c/o low back pain x 1 month.  Pain 9/10.

## 2012-12-15 NOTE — ED Provider Notes (Signed)
History  This chart was scribed for non-physician practitioner working with Raeford Razor, MD by Erskine Emery, ED Scribe. This patient was seen in room WTR5/WTR5 and the patient's care was started at 16:22.   CSN: [id number]  Arrival date & time 12/15/12  1516   First MD Initiated Contact with Patient 12/15/12 1622      Chief Complaint  Patient presents with  . Back Pain    (Consider location/radiation/quality/duration/timing/severity/associated sxs/prior Treatment) [name] is a 32 y.o. female who presents to the Emergency Department complaining of gradually worsening intermittent lower back pain for the past couple weeks that has been constant for the past several days. Patient is a 32 y.o. female presenting with back pain. The history is provided by the patient. No language interpreter was used.  Back Pain  This is a recurrent problem. The current episode started more than 1 week ago. The problem occurs constantly. The problem has been gradually worsening. The pain is associated with lifting heavy objects. The pain is present in the lumbar spine. The pain radiates to the right thigh and right knee. The pain is moderate. The symptoms are aggravated by bending and certain positions. The pain is worse during the night. Associated symptoms include leg pain (right) and tingling (intermittent in right leg). Pertinent negatives include no fever, no numbness, no abdominal pain, no bowel incontinence, no perianal numbness, no bladder incontinence and no weakness. She has tried nothing for the symptoms.  Pt reports some associated chills intermittent tingling in the right leg (not currently), and difficulty sitting in one position for too long but she denies any numbness, fever, or bowel or bladder incontinence. Pt reports she sometimes lifts heavy things around the house. Pt reports the pain is aggravated by laying down and bending over. She hasn't taken anything for the pain. Pt reports a  family h/o cancer but no personal h/o cancer or IV drug use.  Past Medical History  Diagnosis Date  . Ectopic pregnancy   . Urinary tract infection   . Ovarian cyst   . Hypertension     was told, but never on meds    Past Surgical History  Procedure Date  . Etopical surgery   . Cleft palate repair   . Laparoscopy for ectopic pregnancy 2011    Family History  Problem Relation Age of Onset  . Other Neg Hx     History  Substance Use Topics  . Smoking status: Current Every Day Smoker -- 0.2 packs/day for 20 years    Types: Cigarettes  . Smokeless tobacco: Never Used  . Alcohol Use: Yes     Comment: OCCASIONAL    OB History    Grav Para Term Preterm Abortions TAB SAB Ect Mult Living   2    2 0 1 1 0 0      Review of Systems  Constitutional: Positive for chills. Negative for fever and diaphoresis.  Respiratory: Negative for shortness of breath.   Cardiovascular: Negative for leg swelling.  Gastrointestinal: Negative for nausea, vomiting, abdominal pain and bowel incontinence.  Genitourinary: Negative for bladder incontinence.  Musculoskeletal: Positive for back pain.  Skin: Negative for wound.  Neurological: Positive for tingling (intermittent in right leg). Negative for weakness and numbness.  Psychiatric/Behavioral: Positive for sleep disturbance.    Allergies  Review of patient's allergies indicates no known allergies.  Home Medications   Current Outpatient Rx  Name  Route  Sig  Dispense  Refill  . METRONIDAZOLE 1 %  EX GEL   Topical   Apply topically daily.   45 g   0     Triage Vitals: BP 120/79  Pulse 89  Temp 98 F (36.7 C) (Oral)  SpO2 100%  LMP 09/18/2012  Physical Exam  Nursing note and vitals reviewed. Constitutional: She is oriented to person, place, and time. She appears well-developed and well-nourished. No distress.  HENT:  Head: Normocephalic and atraumatic.  Eyes: EOM are normal. Pupils are equal, round, and reactive to light.    Neck: Neck supple. No tracheal deviation present.  Cardiovascular: Normal rate, regular rhythm and normal heart sounds.   Pulmonary/Chest: Effort normal and breath sounds normal. No respiratory distress. She has no wheezes.  Abdominal: Soft. She exhibits no distension.  Musculoskeletal: Normal range of motion. She exhibits no edema.       Tenderness to right and left side of lumbar spine. Paraspinal muscles are tender to palpation as well. Patellar and achilles reflexes are both 2+ bilaterally. Good muscle strength. Normal gait. Distal sensation is intact.  Neurological: She is alert and oriented to person, place, and time. She has normal strength. No sensory deficit. Gait normal.  Skin: Skin is warm and dry.  Psychiatric: She has a normal mood and affect.    ED Course  Procedures (including critical care time) DIAGNOSTIC STUDIES: Oxygen Saturation is 100% on room air, normal by my interpretation.    COORDINATION OF CARE: 17:01--I evaluated the patient and we discussed a treatment plan including pain medication, muscle relaxers, antiinflammatories, and alternating between heat and ice, to which the pt agreed. I notified the pt not to drive or use heavy machinery when using the pain medication.  Labs Reviewed - No data to display No results found.   No diagnosis found.    MDM  Patient with back pain.  Suspect muscular strain.  No neurological deficits and normal neuro exam.  Patient can walk but states is painful.  No loss of bowel or bladder control.  No concern for cauda equina.  No fever, night sweats, weight loss, h/o cancer, IVDU.  RICE protocol and pain medicine indicated and discussed with patient.   I personally performed the services described in this documentation, which was scribed in my presence. The recorded information has been reviewed and is accurate.    Pascal Lux [place], PA-C 12/15/12 2018

## 2012-12-15 NOTE — MAU Provider Note (Signed)
Attestation of Attending Supervision of Advanced Practitioner (PA/CNM/NP): Evaluation and management procedures were performed by the Advanced Practitioner under my supervision and collaboration.  I have reviewed the Advanced Practitioner's note and chart, and I agree with the management and plan.  Maverick Dieudonne, MD, FACOG Attending Obstetrician & Gynecologist Faculty Practice, [facility]  

## 2012-12-15 NOTE — MAU Note (Addendum)
No cycle since Oct, did 2 test, one faintly positve, one neg.  Has skipped a month before, but never gone this long.

## 2012-12-15 NOTE — MAU Provider Note (Signed)
CC: Possible Pregnancy    First Provider Initiated Contact with Patient 12/15/12 1236      HPI [name] is a 32 y.o. G2P0020 With amenorrhea for about 10 weekssince the end of October. She states menses are usually regular every month with moderate flow, however in the past she has skipped a month on occasion when she is "stressed out."  Denies depression or significant lfke change. She is in a mutually monogamous relationship and would like to become pregnant. No contraception. She does endorse some hair thinning in the center and cold temperature intolerance. LMP 09/18/13. Denies metrorrhagia. Denies abnormal vaginal discharge or vaginal irritation.   Past Medical History  Diagnosis Date  . Ectopic pregnancy   . Urinary tract infection   . Ovarian cyst   . Hypertension     was told, but never on meds    OB History    Grav Para Term Preterm Abortions TAB SAB Ect Mult Living   2    2 0 1 1 0 0     # Outc Date GA Lbr Len/2nd Wgt Sex Del Anes PTL Lv   1 SAB            2 ECT               Past Surgical History  Procedure Date  . Etopical surgery   . Cleft palate repair   . Laparoscopy for ectopic pregnancy 2011    History   Social History  . Marital Status: Single    Spouse Name: N/A    Number of Children: N/A  . Years of Education: N/A   Occupational History  . Not on file.   Social History Main Topics  . Smoking status: Current Every Day Smoker -- 0.2 packs/day for 20 years    Types: Cigarettes  . Smokeless tobacco: Never Used  . Alcohol Use: Yes     Comment: OCCASIONAL  . Drug Use: No  . Sexually Active: Yes    Birth Control/ Protection: None   Other Topics Concern  . Not on file   Social History Narrative  . No narrative on file    No current facility-administered medications on file prior to encounter.   Current Outpatient Prescriptions on File Prior to Encounter  Medication Sig Dispense Refill  . aspirin 500 MG tablet Take 500 mg by  mouth every 6 (six) hours as needed. Pain      . oxyCODONE-acetaminophen (PERCOCET/ROXICET) 5-325 MG per tablet Take 1 tablet by mouth every 4 (four) hours as needed for pain.  15 tablet  0  . penicillin v potassium (VEETID) 500 MG tablet Take 1 tablet (500 mg total) by mouth 3 (three) times daily.  30 tablet  0    No Known Allergies  ROS Pertinent items in HPI  PHYSICAL EXAM Filed Vitals:   12/15/12 1144  BP: 135/86  Pulse: 71  Temp: 98.5 F (36.9 C)  Resp: 18   General: Well nourished, well developed female in no acute distress Skin: no hirsutism Neck: thyroid ULNS, no mass Cardiovascular: Normal rate Respiratory: Normal effort Abdomen: Soft, nontender, no organomegaly Back: No CVAT Extremities: No edema Neurologic: Alert and oriented Speculum exam: NEFG; vagina with physiologic discharge, no blood; cervix clean Bimanual exam: cervix closed, no CMT; uterus NSSP; no adnexal tenderness or masses  LAB RESULTS Results for orders placed during the hospital encounter of 12/15/12 (from the past 24 hour(s))  POCT PREGNANCY, URINE     Status:  Normal   Collection Time   12/15/12 11:58 AM      Component Value Range   Preg Test, Ur NEGATIVE  NEGATIVE  WET PREP, GENITAL     Status: Abnormal   Collection Time   12/15/12  1:30 PM      Component Value Range   Yeast Wet Prep HPF POC NONE SEEN  NONE SEEN   Trich, Wet Prep NONE SEEN  NONE SEEN   Clue Cells Wet Prep HPF POC MODERATE (*) NONE SEEN   WBC, Wet Prep HPF POC MODERATE (*) NONE SEEN    IMAGING No results found.  MAU COURSE TSH sent ASSESSMENT  1. Primary amenorrhea   2. BV (bacterial vaginosis)     PLAN Discharge home. See AVS for patient education. D/W Dr. Macon Large  Follow-up Information    Please follow up. (Someone from [facility] hospital GYN clinic will call you at an appointment)           Medication List     As of 12/15/2012  2:09 PM    TAKE these medications         aspirin 500 MG tablet   Take 500  mg by mouth every 6 (six) hours as needed. Pain      metroNIDAZOLE 1 % gel   Commonly known as: METROGEL   Apply topically daily.      oxyCODONE-acetaminophen 5-325 MG per tablet   Commonly known as: PERCOCET/ROXICET   Take 1 tablet by mouth every 4 (four) hours as needed for pain.      penicillin v potassium 500 MG tablet   Commonly known as: VEETID   Take 1 tablet (500 mg total) by mouth 3 (three) times daily.            Danae Orleans, CNM 12/15/2012 12:46 PM

## 2012-12-17 LAB — GC/CHLAMYDIA PROBE AMP: CT Probe RNA: NEGATIVE

## 2012-12-17 NOTE — ED Provider Notes (Signed)
Medical screening examination/treatment/procedure(s) were performed by non-physician practitioner and as supervising physician I was immediately available for consultation/collaboration.  Jahayra Mazo, MD 12/17/12 2104 

## 2012-12-31 ENCOUNTER — Encounter: Payer: Medicaid Other | Admitting: Advanced Practice Midwife

## 2013-01-10 ENCOUNTER — Encounter: Payer: Medicaid Other | Admitting: Obstetrics & Gynecology

## 2013-01-13 ENCOUNTER — Encounter (HOSPITAL_COMMUNITY): Payer: Self-pay | Admitting: Physical Medicine and Rehabilitation

## 2013-01-13 ENCOUNTER — Inpatient Hospital Stay (HOSPITAL_COMMUNITY)
Admission: EM | Admit: 2013-01-13 | Discharge: 2013-01-14 | DRG: 310 | Disposition: A | Discharge status: Home / Self Care | Payer: Medicaid Other | Attending: Internal Medicine | Admitting: Internal Medicine

## 2013-01-13 ENCOUNTER — Observation Stay (HOSPITAL_COMMUNITY): Payer: Medicaid Other

## 2013-01-13 DIAGNOSIS — R112 Nausea with vomiting, unspecified: Secondary | ICD-10-CM | POA: Diagnosis present

## 2013-01-13 DIAGNOSIS — I10 Essential (primary) hypertension: Secondary | ICD-10-CM

## 2013-01-13 DIAGNOSIS — F1020 Alcohol dependence, uncomplicated: Secondary | ICD-10-CM | POA: Diagnosis present

## 2013-01-13 DIAGNOSIS — F17200 Nicotine dependence, unspecified, uncomplicated: Secondary | ICD-10-CM | POA: Diagnosis present

## 2013-01-13 DIAGNOSIS — E876 Hypokalemia: Secondary | ICD-10-CM | POA: Diagnosis present

## 2013-01-13 DIAGNOSIS — D72829 Elevated white blood cell count, unspecified: Secondary | ICD-10-CM | POA: Diagnosis present

## 2013-01-13 DIAGNOSIS — I4891 Unspecified atrial fibrillation: Principal | ICD-10-CM | POA: Diagnosis present

## 2013-01-13 DIAGNOSIS — I480 Paroxysmal atrial fibrillation: Secondary | ICD-10-CM | POA: Diagnosis present

## 2013-01-13 LAB — CBC WITH DIFFERENTIAL/PLATELET
Basophils Absolute: 0.1 10*3/uL (ref 0.0–0.1)
Basophils Relative: 0 % (ref 0–1)
Eosinophils Absolute: 0 10*3/uL (ref 0.0–0.7)
Eosinophils Relative: 0 % (ref 0–5)
HCT: 43.6 % (ref 36.0–46.0)
Hemoglobin: 15.8 g/dL — ABNORMAL HIGH (ref 12.0–15.0)
Lymphocytes Relative: 10 % — ABNORMAL LOW (ref 12–46)
Lymphs Abs: 1.3 10*3/uL (ref 0.7–4.0)
MCH: 32 pg (ref 26.0–34.0)
MCHC: 36.2 g/dL — ABNORMAL HIGH (ref 30.0–36.0)
MCV: 88.3 fL (ref 78.0–100.0)
Monocytes Absolute: 0.9 10*3/uL (ref 0.1–1.0)
Monocytes Relative: 7 % (ref 3–12)
Neutro Abs: 10.5 10*3/uL — ABNORMAL HIGH (ref 1.7–7.7)
Neutrophils Relative %: 82 % — ABNORMAL HIGH (ref 43–77)
Platelets: 299 10*3/uL (ref 150–400)
RBC: 4.94 MIL/uL (ref 3.87–5.11)
RDW: 13.5 % (ref 11.5–15.5)
WBC: 12.8 10*3/uL — ABNORMAL HIGH (ref 4.0–10.5)

## 2013-01-13 LAB — RAPID URINE DRUG SCREEN, HOSP PERFORMED
Amphetamines: NOT DETECTED
Barbiturates: NOT DETECTED
Benzodiazepines: NOT DETECTED
Cocaine: NOT DETECTED
Opiates: POSITIVE — AB
Tetrahydrocannabinol: NOT DETECTED

## 2013-01-13 LAB — COMPREHENSIVE METABOLIC PANEL
ALT: 41 U/L — ABNORMAL HIGH (ref 0–35)
AST: 27 U/L (ref 0–37)
Albumin: 4.6 g/dL (ref 3.5–5.2)
Alkaline Phosphatase: 70 U/L (ref 39–117)
BUN: 7 mg/dL (ref 6–23)
CO2: 24 mEq/L (ref 19–32)
Calcium: 9.8 mg/dL (ref 8.4–10.5)
Chloride: 103 mEq/L (ref 96–112)
Creatinine, Ser: 0.41 mg/dL — ABNORMAL LOW (ref 0.50–1.10)
GFR calc Af Amer: >90 mL/min (ref >90)
GFR calc non Af Amer: >90 mL/min (ref >90)
Glucose, Bld: 98 mg/dL (ref 70–99)
Potassium: 3.8 mEq/L (ref 3.5–5.1)
Sodium: 140 mEq/L (ref 135–145)
Total Bilirubin: 0.7 mg/dL (ref 0.3–1.2)
Total Protein: 8.4 g/dL — ABNORMAL HIGH (ref 6.0–8.3)

## 2013-01-13 LAB — CBC
HCT: 42.7 % (ref 36.0–46.0)
Hemoglobin: 14.9 g/dL (ref 12.0–15.0)
MCH: 31.4 pg (ref 26.0–34.0)
MCHC: 34.9 g/dL (ref 30.0–36.0)
MCV: 90.1 fL (ref 78.0–100.0)
Platelets: 297 K/uL (ref 150–400)
RBC: 4.74 MIL/uL (ref 3.87–5.11)
RDW: 13.8 % (ref 11.5–15.5)
WBC: 10.9 K/uL — ABNORMAL HIGH (ref 4.0–10.5)

## 2013-01-13 LAB — URINE MICROSCOPIC-ADD ON

## 2013-01-13 LAB — URINALYSIS, ROUTINE W REFLEX MICROSCOPIC
Bilirubin Urine: NEGATIVE
Glucose, UA: NEGATIVE mg/dL
Hgb urine dipstick: NEGATIVE
Ketones, ur: 15 mg/dL — AB
Leukocytes, UA: NEGATIVE
Nitrite: NEGATIVE
Protein, ur: 30 mg/dL — AB
Specific Gravity, Urine: 1.025 (ref 1.005–1.030)
Urobilinogen, UA: 0.2 mg/dL (ref 0.0–1.0)
pH: 7 (ref 5.0–8.0)

## 2013-01-13 LAB — POCT PREGNANCY, URINE: Preg Test, Ur: NEGATIVE

## 2013-01-13 LAB — PREGNANCY, URINE: Preg Test, Ur: NEGATIVE

## 2013-01-13 LAB — TROPONIN I
Troponin I: <0.3 ng/mL (ref <0.30)
Troponin I: <0.3 ng/mL

## 2013-01-13 LAB — LIPASE, BLOOD: Lipase: 49 U/L (ref 11–59)

## 2013-01-13 LAB — CREATININE, SERUM
GFR calc Af Amer: >90 mL/min (ref >90)
GFR calc non Af Amer: >90 mL/min (ref >90)

## 2013-01-13 LAB — ETHANOL: Alcohol, Ethyl (B): <11 mg/dL (ref 0–11)

## 2013-01-13 LAB — D-DIMER, QUANTITATIVE (NOT AT ARMC): D-Dimer, Quant: <0.27 ug/mL-FEU (ref 0.00–0.48)

## 2013-01-13 MED ORDER — DILTIAZEM HCL 60 MG PO TABS
60.0000 mg | ORAL_TABLET | Freq: Three times a day (TID) | ORAL | Status: DC
  Administered 2013-01-13 – 2013-01-14 (×2): 60 mg via ORAL
  Filled 2013-01-13 (×6): qty 1

## 2013-01-13 MED ORDER — DILTIAZEM HCL 50 MG/10ML IV SOLN: 10.0000 mg | Freq: Once | INTRAVENOUS | Status: DC

## 2013-01-13 MED ORDER — PROMETHAZINE HCL 25 MG/ML IJ SOLN
12.5000 mg | Freq: Once | INTRAMUSCULAR | Status: AC
  Administered 2013-01-13: 12.5 mg via INTRAVENOUS
  Filled 2013-01-13: qty 1

## 2013-01-13 MED ORDER — SODIUM CHLORIDE 0.9 % IV BOLUS (SEPSIS)
1000.0000 mL | Freq: Once | INTRAVENOUS | Status: AC
  Administered 2013-01-13: 1000 mL via INTRAVENOUS

## 2013-01-13 MED ORDER — ENOXAPARIN SODIUM 40 MG/0.4ML ~~LOC~~ SOLN
40.0000 mg | Freq: Every day | SUBCUTANEOUS | Status: DC
  Administered 2013-01-13: 40 mg via SUBCUTANEOUS
  Filled 2013-01-13 (×3): qty 0.4

## 2013-01-13 MED ORDER — SODIUM CHLORIDE 0.9 % IJ SOLN: 3.0000 mL | Freq: Two times a day (BID) | INTRAMUSCULAR | Status: DC

## 2013-01-13 MED ORDER — ONDANSETRON HCL 4 MG PO TABS: 4.0000 mg | ORAL_TABLET | Freq: Four times a day (QID) | ORAL | Status: DC | PRN

## 2013-01-13 MED ORDER — ASPIRIN EC 325 MG PO TBEC
325.0000 mg | DELAYED_RELEASE_TABLET | Freq: Every day | ORAL | Status: DC
  Administered 2013-01-13 – 2013-01-14 (×2): 325 mg via ORAL
  Filled 2013-01-13 (×2): qty 1

## 2013-01-13 MED ORDER — MORPHINE SULFATE 4 MG/ML IJ SOLN
6.0000 mg | Freq: Once | INTRAMUSCULAR | Status: AC
  Administered 2013-01-13: 6 mg via INTRAVENOUS
  Filled 2013-01-13: qty 2

## 2013-01-13 MED ORDER — SODIUM CHLORIDE 0.9 % IV SOLN
INTRAVENOUS | Status: DC
  Administered 2013-01-13 – 2013-01-14 (×2): via INTRAVENOUS

## 2013-01-13 MED ORDER — GUAIFENESIN ER 600 MG PO TB12
600.0000 mg | ORAL_TABLET | Freq: Two times a day (BID) | ORAL | Status: DC
  Administered 2013-01-13 – 2013-01-14 (×2): 600 mg via ORAL
  Filled 2013-01-13 (×4): qty 1

## 2013-01-13 MED ORDER — PANTOPRAZOLE SODIUM 40 MG PO TBEC
40.0000 mg | DELAYED_RELEASE_TABLET | Freq: Every day | ORAL | Status: DC
  Administered 2013-01-13 – 2013-01-14 (×2): 40 mg via ORAL
  Filled 2013-01-13 (×2): qty 1

## 2013-01-13 MED ORDER — LEVALBUTEROL HCL 0.63 MG/3ML IN NEBU
0.6300 mg | INHALATION_SOLUTION | Freq: Four times a day (QID) | RESPIRATORY_TRACT | Status: DC | PRN
  Filled 2013-01-13: qty 3

## 2013-01-13 MED ORDER — ACETAMINOPHEN 650 MG RE SUPP: 650.0000 mg | Freq: Four times a day (QID) | RECTAL | Status: DC | PRN

## 2013-01-13 MED ORDER — DILTIAZEM HCL 50 MG/10ML IV SOLN
10.0000 mg | Freq: Once | INTRAVENOUS | Status: AC
  Administered 2013-01-13: 15 mg via INTRAVENOUS
  Filled 2013-01-13: qty 2

## 2013-01-13 MED ORDER — ACETAMINOPHEN 325 MG PO TABS
650.0000 mg | ORAL_TABLET | Freq: Four times a day (QID) | ORAL | Status: DC | PRN
  Administered 2013-01-13: 650 mg via ORAL
  Filled 2013-01-13: qty 2

## 2013-01-13 MED ORDER — ONDANSETRON HCL 4 MG/2ML IJ SOLN
4.0000 mg | Freq: Four times a day (QID) | INTRAMUSCULAR | Status: DC | PRN
  Administered 2013-01-13: 4 mg via INTRAVENOUS
  Filled 2013-01-13: qty 2

## 2013-01-13 NOTE — Consult Note (Signed)
Reason for Consult:  Atrial fib with RVR  Referring Physician: ER MD and TRH   [name] is an 32 y.o. female.    Chief Complaint: nausea and vomiting and irreg. HR   HPI: 32 year old female with a 5 pack year history who presented today for nausea and vomiting. Yesterday evening she started to feel nauseated and then vomited. She denies any blood in the vomit and states that it looked like bubbles with pepsi. This happened about five more times with just a little bit each time. This morning she vomited one more time and says the color was orange and that was why she called the ambulance.  Today she also noticed an irregular heart rate that was new.  No chest pain, no SOB.  Yesterday she drank about two beers and five cocktails and only ate breakfast. Denies any pain, fever, weight loss, chills, hematuria, hematochezia, constipation, diarrhea or alcoholism. She says she's had a similar episode once before about five years ago with the same symptoms except she denies having any atrial fib or irregular HR before. She had been drinking and not eaten and went to the ED and was told she had reflux and to take an antacid.   EKG revealed A. Fib. With RVR.  Rec'd IV cardizem 10 mg IV.  Also rec'd 1 liter of IV fluids.  Currently HR in the 90's to 100's.  Nausea improved after phenergan.   Past Medical History  Diagnosis Date  . Ectopic pregnancy   . Urinary tract infection   . Ovarian cyst   . Hypertension     was told, but never on meds    Past Surgical History  Procedure Laterality Date  . Etopical surgery    . Cleft palate repair    . Laparoscopy for ectopic pregnancy  2011    Family History  Problem Relation Age of Onset  . Other Neg Hx    Social History:  reports that she has been smoking Cigarettes.  She has a 5 pack-year smoking history. She has never used smokeless tobacco. She reports that  drinks alcohol. She reports that she does not use illicit drugs.  Allergies: No  Known Allergies  OUT Patient Medications: NONE  Results for orders placed during the hospital encounter of 01/13/13 (from the past 48 hour(s))  TROPONIN I     Status: None   Collection Time    01/13/13 11:32 AM      Result Value Range   Troponin I <0.30  <0.30 ng/mL   Comment:            Due to the release kinetics of cTnI,     a negative result within the first hours     of the onset of symptoms does not rule out     myocardial infarction with certainty.     If myocardial infarction is still suspected,     repeat the test at appropriate intervals.  COMPREHENSIVE METABOLIC PANEL     Status: Abnormal   Collection Time    01/13/13 11:32 AM      Result Value Range   Sodium 140  135 - 145 mEq/L   Potassium 3.8  3.5 - 5.1 mEq/L   Chloride 103  96 - 112 mEq/L   CO2 24  19 - 32 mEq/L   Glucose, Bld 98  70 - 99 mg/dL   BUN 7  6 - 23 mg/dL   Creatinine, Ser 1.61 (*) 0.50 -  1.10 mg/dL   Calcium 9.8  8.4 - 56.2 mg/dL   Total Protein 8.4 (*) 6.0 - 8.3 g/dL   Albumin 4.6  3.5 - 5.2 g/dL   AST 27  0 - 37 U/L   ALT 41 (*) 0 - 35 U/L   Alkaline Phosphatase 70  39 - 117 U/L   Total Bilirubin 0.7  0.3 - 1.2 mg/dL   GFR calc non Af Amer >90  >90 mL/min   GFR calc Af Amer >90  >90 mL/min   Comment:            The eGFR has been calculated     using the CKD EPI equation.     This calculation has not been     validated in all clinical     situations.     eGFR's persistently     <90 mL/min signify     possible Chronic Kidney Disease.  CBC WITH DIFFERENTIAL     Status: Abnormal   Collection Time    01/13/13 11:32 AM      Result Value Range   WBC 12.8 (*) 4.0 - 10.5 K/uL   RBC 4.94  3.87 - 5.11 MIL/uL   Hemoglobin 15.8 (*) 12.0 - 15.0 g/dL   HCT 13.0  86.5 - 78.4 %   MCV 88.3  78.0 - 100.0 fL   MCH 32.0  26.0 - 34.0 pg   MCHC 36.2 (*) 30.0 - 36.0 g/dL   RDW 69.6  29.5 - 28.4 %   Platelets 299  150 - 400 K/uL   Neutrophils Relative 82 (*) 43 - 77 %   Neutro Abs 10.5 (*) 1.7 - 7.7  K/uL   Lymphocytes Relative 10 (*) 12 - 46 %   Lymphs Abs 1.3  0.7 - 4.0 K/uL   Monocytes Relative 7  3 - 12 %   Monocytes Absolute 0.9  0.1 - 1.0 K/uL   Eosinophils Relative 0  0 - 5 %   Eosinophils Absolute 0.0  0.0 - 0.7 K/uL   Basophils Relative 0  0 - 1 %   Basophils Absolute 0.1  0.0 - 0.1 K/uL  LIPASE, BLOOD     Status: None   Collection Time    01/13/13 11:32 AM      Result Value Range   Lipase 49  11 - 59 U/L  POCT PREGNANCY, URINE     Status: None   Collection Time    01/13/13 12:31 PM      Result Value Range   Preg Test, Ur NEGATIVE  NEGATIVE   Comment:            THE SENSITIVITY OF THIS     METHODOLOGY IS >24 mIU/mL  PREGNANCY, URINE     Status: None   Collection Time    01/13/13 12:35 PM      Result Value Range   Preg Test, Ur NEGATIVE  NEGATIVE   Comment:            THE SENSITIVITY OF THIS     METHODOLOGY IS >20 mIU/mL.  URINALYSIS, ROUTINE W REFLEX MICROSCOPIC     Status: Abnormal   Collection Time    01/13/13 12:35 PM      Result Value Range   Color, Urine YELLOW  YELLOW   APPearance CLOUDY (*) CLEAR   Specific Gravity, Urine 1.025  1.005 - 1.030   pH 7.0  5.0 - 8.0   Glucose, UA NEGATIVE  NEGATIVE mg/dL  Hgb urine dipstick NEGATIVE  NEGATIVE   Bilirubin Urine NEGATIVE  NEGATIVE   Ketones, ur 15 (*) NEGATIVE mg/dL   Protein, ur 30 (*) NEGATIVE mg/dL   Urobilinogen, UA 0.2  0.0 - 1.0 mg/dL   Nitrite NEGATIVE  NEGATIVE   Leukocytes, UA NEGATIVE  NEGATIVE  ETHANOL     Status: None   Collection Time    01/13/13 12:35 PM      Result Value Range   Alcohol, Ethyl (B) <11  0 - 11 mg/dL   Comment:            LOWEST DETECTABLE LIMIT FOR     SERUM ALCOHOL IS 11 mg/dL     FOR MEDICAL PURPOSES ONLY  URINE MICROSCOPIC-ADD ON     Status: Abnormal   Collection Time    01/13/13 12:35 PM      Result Value Range   Squamous Epithelial / LPF FEW (*) RARE   WBC, UA 3-6  <3 WBC/hpf   Bacteria, UA FEW (*) RARE  URINE RAPID DRUG SCREEN (HOSP PERFORMED)      Status: Abnormal   Collection Time    01/13/13 12:51 PM      Result Value Range   Opiates POSITIVE (*) NONE DETECTED   Cocaine NONE DETECTED  NONE DETECTED   Benzodiazepines NONE DETECTED  NONE DETECTED   Amphetamines NONE DETECTED  NONE DETECTED   Tetrahydrocannabinol NONE DETECTED  NONE DETECTED   Barbiturates NONE DETECTED  NONE DETECTED   Comment:            DRUG SCREEN FOR MEDICAL PURPOSES     ONLY.  IF CONFIRMATION IS NEEDED     FOR ANY PURPOSE, NOTIFY LAB     WITHIN 5 DAYS.                LOWEST DETECTABLE LIMITS     FOR URINE DRUG SCREEN     Drug Class       Cutoff (ng/mL)     Amphetamine      1000     Barbiturate      200     Benzodiazepine   200     Tricyclics       300     Opiates          300     Cocaine          300     THC              50  D-DIMER, QUANTITATIVE     Status: None   Collection Time    01/13/13  2:06 PM      Result Value Range   D-Dimer, Quant <0.27  0.00 - 0.48 ug/mL-FEU   Comment:            AT THE INHOUSE ESTABLISHED CUTOFF     VALUE OF 0.48 ug/mL FEU,     THIS ASSAY HAS BEEN DOCUMENTED     IN THE LITERATURE TO HAVE     A SENSITIVITY AND NEGATIVE     PREDICTIVE VALUE OF AT LEAST     98 TO 99%.  THE TEST RESULT     SHOULD BE CORRELATED WITH     AN ASSESSMENT OF THE CLINICAL     PROBABILITY OF DVT / VTE.   Dg Chest 2 View  01/13/2013  *RADIOLOGY REPORT*  Clinical Data: Dizziness.  CHEST - 2 VIEW  Comparison: None.  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  Otherwise cardiomediastinal silhouette appears normal. No acute pulmonary disease is noted.  Bony thorax is intact.  IMPRESSION: No acute cardiopulmonary abnormality seen.   Original Report Authenticated By: Lupita Raider.,  M.D.     ROS: General:+ cold type symptoms with mild sore throat yesterday and she took musinex then started vomiting.  no fevers, no weight changes Skin:no rashes or ulcers HEENT:no blurred vision, no congestion, mild sore  throat CV:see HPI PUL:see HPI GI:no diarrhea constipation or melena, no indigestion GU:no hematuria, no dysuria MS:no joint pain, no claudication Neuro:no syncope, no lightheadedness Endo:no diabetes, no thyroid disease GYN: no pregnancy and neg. Preg. Test.   Blood pressure 96/75, pulse 94, temperature 97.9 F (36.6 C), temperature source Oral, resp. rate 26, last menstrual period 09/18/2012, SpO2 100.00%. PE: General:alert and oriented, MAE, follows commands, pleasant affect.  NAD Skin:warm and dry brisk capillary refill HEENT:normocephalic, sclera clear Neck:supple, non JVD, no bruits Heart:irreg irreg no murmur gallup rub or click Lungs:clear without rales, rhonchi or wheezes Abd:+ BS soft, non tender Ext:no edema 2+ pedal pulses bil. Neuro:alert and oriented X 3, MAE, follows commands.    Assessment/Plan Principal Problem:   Atrial fibrillation, new Active Problems:   Nausea and vomiting in adult   PLAN: Pt being admitted for observation.  WBC elevated at 12.8, urine culture sent.  HR with improved control on  Bolus of cardizem.  If drip not started would begin cardizem 30 mg po every 6 hours. check Echo, TSH. Magnesium.   Chads2 score 0.  INGOLD,LAURA R 01/13/2013, 4:39 PM

## 2013-01-13 NOTE — H&P (Signed)
[facility] History and Physical  [name] ZOX:[id number] DOB: 1981-04-04 DOA: 01/13/2013  Referring physician: Raeford Razor, MD  PCP: No primary provider on file.   Chief Complaint:Nausea  .  Emesis  .  Atrial Fibrillation     HPI:  32 year old female with a 5 pack year history who presents today for nausea and vomiting. Yesterday evening she started to feel nauseated and then vomited. She denies any blood in the vomit and states that it looked like bubbles with pepsi. This happened about five more times with just a little bit each time. This morning she vomited one more time and says the color was orange and that was why she called the ambulance. Yesterday she drank about two beers and five cocktails and only ate breakfast. Denies any pain, fever, weight loss, chills, hematuria, hematochezia, constipation, diarrhea or alcoholism. She says she's had a similar episode once before about five years ago with the same symptoms. She had been drinking and not eaten and went to the ED and was told she had reflux and to take an antacid. EKG showed new onset atrial fibrillation, heart rate of 99 also cardiology was consulted       Review of Systems: negative for the following  Constitutional: Denies fever, chills, diaphoresis, appetite change and fatigue.  HEENT: Denies photophobia, eye pain, redness, hearing loss, ear pain, congestion, sore throat, rhinorrhea, sneezing, mouth sores, trouble swallowing, neck pain, neck stiffness and tinnitus.  Respiratory: Denies SOB, DOE, cough, chest tightness, and wheezing.  Cardiovascular: An irregularly irregular rhythm present. , palpitations and leg swelling.  Gastrointestinal: Denies nausea, vomiting, abdominal pain, diarrhea, constipation, blood in stool and abdominal distention.  Genitourinary: Denies dysuria, urgency, frequency, hematuria, flank pain and difficulty urinating.  Musculoskeletal: Denies myalgias, back pain, joint  swelling, arthralgias and gait problem.  Skin: Denies pallor, rash and wound.  Neurological: Denies dizziness, seizures, syncope, weakness, light-headedness, numbness and headaches.  Hematological: Denies adenopathy. Easy bruising, personal or family bleeding history  Psychiatric/Behavioral: Denies suicidal ideation, mood changes, confusion, nervousness, sleep disturbance and agitation       Past Medical History  Diagnosis Date  . Ectopic pregnancy   . Urinary tract infection   . Ovarian cyst   . Hypertension     was told, but never on meds     Past Surgical History  Procedure Laterality Date  . Etopical surgery    . Cleft palate repair    . Laparoscopy for ectopic pregnancy  2011      Social History:  reports that she has been smoking Cigarettes.  She has a 5 pack-year smoking history. She has never used smokeless tobacco. She reports that  drinks alcohol. She reports that she does not use illicit drugs.   No Known Allergies  Family History  Problem Relation Age of Onset  . Other Neg Hx      Prior to Admission medications   Medication Sig Start Date End Date Taking? Authorizing Provider  GuaiFENesin (MUCINEX PO) Take 1 tablet by mouth once.   Yes Historical Provider, MD     Physical Exam: Filed Vitals:   01/13/13 1636 01/13/13 1700 01/13/13 1715 01/13/13 1730  BP: 96/75 118/87 126/91 109/74  Pulse: 94 62 80 80  Temp:      TempSrc:      Resp: 26 15 15 17   SpO2: 100% 100% 100% 100%     Constitutional: Vital signs reviewed. Patient is a well-developed and well-nourished in no acute distress  and cooperative with exam. Alert and oriented x3.  Head: Normocephalic and atraumatic  Ear: TM normal bilaterally  Mouth: no erythema or exudates, MMM  Eyes: PERRL, EOMI, conjunctivae normal, No scleral icterus.  Neck: Supple, Trachea midline normal ROM, No JVD, mass, thyromegaly, or carotid bruit present.  Cardiovascular: RRR, S1 normal, S2 normal, no MRG, pulses  symmetric and intact bilaterally  Pulmonary/Chest: CTAB, no wheezes, rales, or rhonchi  Abdominal: Soft. Non-tender, non-distended, bowel sounds are normal, no masses, organomegaly, or guarding present.  GU: no CVA tenderness Musculoskeletal: No joint deformities, erythema, or stiffness, ROM full and no nontender Ext: no edema and no cyanosis, pulses palpable bilaterally (DP and PT)  Hematology: no cervical, inginal, or axillary adenopathy.  Neurological: A&O x3, Strenght is normal and symmetric bilaterally, cranial nerve II-XII are grossly intact, no focal motor deficit, sensory intact to light touch bilaterally.  Skin: Warm, dry and intact. No rash, cyanosis, or clubbing.  Psychiatric: Normal mood and affect. speech and behavior is normal. Judgment and thought content normal. Cognition and memory are normal.       Labs on Admission:    Basic Metabolic Panel:  Recent Labs Lab 01/13/13 1132  NA 140  K 3.8  CL 103  CO2 24  GLUCOSE 98  BUN 7  CREATININE 0.41*  CALCIUM 9.8   Liver Function Tests:  Recent Labs Lab 01/13/13 1132  AST 27  ALT 41*  ALKPHOS 70  BILITOT 0.7  PROT 8.4*  ALBUMIN 4.6    Recent Labs Lab 01/13/13 1132  LIPASE 49   No results found for this basename: AMMONIA,  in the last 168 hours CBC:  Recent Labs Lab 01/13/13 1132  WBC 12.8*  NEUTROABS 10.5*  HGB 15.8*  HCT 43.6  MCV 88.3  PLT 299   Cardiac Enzymes:  Recent Labs Lab 01/13/13 1132  TROPONINI <0.30    BNP (last 3 results) No results found for this basename: PROBNP,  in the last 8760 hours    CBG: No results found for this basename: GLUCAP,  in the last 168 hours  Radiological Exams on Admission: Dg Chest 2 View  01/13/2013  *RADIOLOGY REPORT*  Clinical Data: Dizziness.  CHEST - 2 VIEW  Comparison: None.  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  Otherwise cardiomediastinal silhouette appears normal. No acute pulmonary disease is noted.   Bony thorax is intact.  IMPRESSION: No acute cardiopulmonary abnormality seen.   Original Report Authenticated By: Lupita Raider.,  M.D.     EKG: Independently reviewedRate: 121  Rhythm: atrial fibrillation  QRS Axis: normal  Intervals: normal  ST/T Wave abnormalities: normal  Conduction Disutrbances:none  Narrative Interpretation:  Old EKG Reviewed: none available   Assessment/Plan Principal Problem:   Atrial fibrillation, new Active Problems:   Nausea and vomiting in adult   Hypertension   1. New onset atrial fibrillation likely in the setting of alcohol use, EtOH level was negative. Urine drug screen was negative essentially, d-dimer negative. The patient received IV diltiazem with improvement in her symptoms. She was symptomatic with her atrial fibrillation that improved with IV hydration she received a total of 2 L of normal saline in the ED. Cardiology was notified and did recommend observation overnight and has been started on by mouth Cardizem. We'll obtain a 2-D echo, TSH, cycle cardiac enzymes. Chest x-ray is negative. 2. Alcohol dependence monitor for withdrawal on CIWA protocol. 3. Leukocytosis likely stress margination  Code Status:   full Family Communication:  bedside Disposition Plan: admit   Time spent: 70 mins   [facility] Pager [phone]  If 7PM-7AM, please contact night-coverage [email or web address] Password [facility] 01/13/2013, 5:33 PM

## 2013-01-13 NOTE — ED Provider Notes (Signed)
History     CSN: [id number]  Arrival date & time 01/13/13  1045   First MD Initiated Contact with Patient 01/13/13 1046      Chief Complaint  Patient presents with  . Nausea  . Emesis  . Atrial Fibrillation    (Consider location/radiation/quality/duration/timing/severity/associated sxs/prior treatment) Patient is a 32 y.o. female presenting with vomiting and atrial fibrillation.  Emesis Atrial Fibrillation Associated symptoms include vomiting.   Patient is a 32 year old female with a 5 pack year history who presents today for nausea and vomiting.  Yesterday evening she started to feel nauseated and then vomited.  She denies any blood in the vomit and states that it looked like bubbles with pepsi.  This happened about five more times with just a little bit each time.  This morning she vomited one more time and says the color was orange and that was why she called the ambulance.  Yesterday she drank about two beers and five cocktails and only ate breakfast.  Denies any pain, fever, weight loss, chills, hematuria, hematochezia, constipation, diarrhea or alcoholism.  She says she's had a similar episode once before about five years ago with the same symptoms.  She had been drinking and not eaten and went to the ED and was told she had reflux and to take an antacid.   Past Medical History  Diagnosis Date  . Ectopic pregnancy   . Urinary tract infection   . Ovarian cyst   . Hypertension     was told, but never on meds    Past Surgical History  Procedure Laterality Date  . Etopical surgery    . Cleft palate repair    . Laparoscopy for ectopic pregnancy  2011    Family History  Problem Relation Age of Onset  . Other Neg Hx     History  Substance Use Topics  . Smoking status: Current Every Day Smoker -- 0.25 packs/day for 20 years    Types: Cigarettes  . Smokeless tobacco: Never Used  . Alcohol Use: Yes     Comment: OCCASIONAL    OB History   Grav Para Term Preterm  Abortions TAB SAB Ect Mult Living   2    2 0 1 1 0 0      Review of Systems  Gastrointestinal: Positive for vomiting.   All other systems negative except as documented in the HPI. All pertinent positives and negatives as reviewed in the HPI.  Allergies  Review of patient's allergies indicates no known allergies.  Home Medications   Current Outpatient Rx  Name  Route  Sig  Dispense  Refill  . GuaiFENesin (MUCINEX PO)   Oral   Take 1 tablet by mouth once.           BP 125/80  Pulse 75  Temp(Src) 97.9 F (36.6 C) (Oral)  Resp 14  SpO2 100%  LMP 09/18/2012  Physical Exam  Constitutional: She is oriented to person, place, and time. She appears well-developed and well-nourished.  Cardiovascular: An irregularly irregular rhythm present.  Pulmonary/Chest: Effort normal and breath sounds normal.  Abdominal: Soft. Normal appearance. There is no splenomegaly or hepatomegaly. There is no tenderness.  Neurological: She is alert and oriented to person, place, and time.  Psychiatric: She has a normal mood and affect.    ED Course  Procedures (including critical care time)  Labs Reviewed  TROPONIN I  COMPREHENSIVE METABOLIC PANEL  CBC WITH DIFFERENTIAL  LIPASE, BLOOD   The patient  will be admitted to the hospital. Spoke with [facility] and [facility] about the patient. The patient is still dizzy  And not feeling her norm.   MDM   Date: 01/13/2013  Rate: 121  Rhythm: atrial fibrillation  QRS Axis: normal  Intervals: normal  ST/T Wave abnormalities: normal  Conduction Disutrbances:none  Narrative Interpretation:   Old EKG Reviewed: none available    Date: 01/13/2013  Rate: 99  Rhythm: atrial fibrillation  QRS Axis: normal  Intervals: normal  ST/T Wave abnormalities: normal  Conduction Disutrbances:none  Narrative Interpretation:   Old EKG Reviewed: changes noted         Carlyle Dolly, PA-C 01/13/13 1611

## 2013-01-13 NOTE — ED Notes (Addendum)
Pt presents to department via [facility] for evaluation of nausea/vomiting and atrial fibrillation. Ongoing x1 day. Pt reports abdominal pain and flatulence. Received 4mg  zofran per EMS. 20g R hand. New onset atrial fibrillation, rate of 140bpm. Denies chest pain. Respirations unlabored. History of hypertension. She is alert and oriented x4.

## 2013-01-13 NOTE — ED Notes (Signed)
Returned from xray

## 2013-01-13 NOTE — ED Notes (Signed)
Repeat ekg done shown to doctor

## 2013-01-13 NOTE — ED Notes (Signed)
Cardiology at bedside to eval pt 

## 2013-01-13 NOTE — ED Provider Notes (Signed)
Medical screening examination/treatment/procedure(s) were conducted as a shared visit with non-physician practitioner(s) and myself.  I personally evaluated the patient during the encounter  Derwood Kaplan, MD 01/13/13 1610

## 2013-01-13 NOTE — Consult Note (Signed)
I have seen and evaluated the patient this PM along with Nada Boozer, NP. I agree with her findings, examination as well as impression recommendations.   Otherwise healthy 32 y/o woman who presented this AM with N/V & found to be in Afib-RVR.  Rate improved after IVF bolus & IV Diltiazem 10mg . Irreg Irreg with 2/6 SEM @ RUSB--> Carotids is only abnormality on exam.  Admitted for observation & PO rate control.  I suspect that her prolonged GI Sx of Nausea & Vomiting from last PM to this AM following a prolonged period of EtOH intoxication resulted in dehydration with potential EtOH mediated cardio-toxicity --> Afib. (i.e. "Holiday Heart").  Would check echo to assess EF & systolic murmur on exam. Agree with PO CCB - follow on tele.  Will reassess in AM   Damieon Armendariz W, M.D., M.S. [facility] [address] [place], [place]  [address]  [phone] Pager # [phone] 01/13/2013 8:33 PM

## 2013-01-14 ENCOUNTER — Encounter (HOSPITAL_COMMUNITY): Payer: Self-pay | Admitting: *Deleted

## 2013-01-14 DIAGNOSIS — I4891 Unspecified atrial fibrillation: Secondary | ICD-10-CM

## 2013-01-14 LAB — COMPREHENSIVE METABOLIC PANEL
ALT: 31 U/L (ref 0–35)
AST: 21 U/L (ref 0–37)
Albumin: 3.4 g/dL — ABNORMAL LOW (ref 3.5–5.2)
Alkaline Phosphatase: 55 U/L (ref 39–117)
BUN: 7 mg/dL (ref 6–23)
Calcium: 8.7 mg/dL (ref 8.4–10.5)
Creatinine, Ser: 0.52 mg/dL (ref 0.50–1.10)
GFR calc non Af Amer: >90 mL/min (ref >90)
Glucose, Bld: 84 mg/dL (ref 70–99)
Potassium: 3.1 mEq/L — ABNORMAL LOW (ref 3.5–5.1)
Total Bilirubin: 0.4 mg/dL (ref 0.3–1.2)
Total Protein: 6.3 g/dL (ref 6.0–8.3)

## 2013-01-14 LAB — CBC
MCH: 30.7 pg (ref 26.0–34.0)
MCHC: 34 g/dL (ref 30.0–36.0)
Platelets: 241 10*3/uL (ref 150–400)
RDW: 14 % (ref 11.5–15.5)

## 2013-01-14 LAB — URINE CULTURE: Colony Count: 30000

## 2013-01-14 LAB — TROPONIN I: Troponin I: <0.3 ng/mL (ref <0.30)

## 2013-01-14 LAB — MAGNESIUM: Magnesium: 1.9 mg/dL (ref 1.5–2.5)

## 2013-01-14 LAB — TSH: TSH: 0.585 u[IU]/mL (ref 0.350–4.500)

## 2013-01-14 MED ORDER — POTASSIUM CHLORIDE CRYS ER 20 MEQ PO TBCR
40.0000 meq | EXTENDED_RELEASE_TABLET | Freq: Once | ORAL | Status: AC
  Administered 2013-01-14: 40 meq via ORAL
  Filled 2013-01-14: qty 2

## 2013-01-14 MED ORDER — OFF THE BEAT BOOK
Freq: Once | Status: AC
  Administered 2013-01-14: 08:00:00
  Filled 2013-01-14: qty 1

## 2013-01-14 MED ORDER — ASPIRIN 325 MG PO TBEC: 325.0000 mg | DELAYED_RELEASE_TABLET | Freq: Every day | ORAL | Status: DC

## 2013-01-14 MED ORDER — DILTIAZEM HCL ER COATED BEADS 180 MG PO CP24
180.0000 mg | ORAL_CAPSULE | Freq: Every day | ORAL | Status: DC
  Administered 2013-01-14: 180 mg via ORAL
  Filled 2013-01-14: qty 1

## 2013-01-14 MED ORDER — DILTIAZEM HCL ER COATED BEADS 180 MG PO CP24: 180.0000 mg | ORAL_CAPSULE | Freq: Every day | ORAL | Status: DC

## 2013-01-14 MED ORDER — SALINE SPRAY 0.65 % NA SOLN
1.0000 | NASAL | Status: DC | PRN
  Filled 2013-01-14: qty 44

## 2013-01-14 NOTE — Discharge Summary (Signed)
Physician Discharge Summary  [name] YNW:[id number] DOB: January 04, 1981 DOA: 01/13/2013  PCP: No primary provider on file.  Admit date: 01/13/2013 Discharge date: 01/14/2013  Time spent: Less than 30 minutes  Recommendations for Outpatient Follow-up:  1. Huey Bienenstock, Cardiology PA on 2/19 at 11:20 am. 2. With Primary Medical Doctor, in 2 weeks.  Discharge Diagnoses:  Principal Problem:   Atrial fibrillation, new Active Problems:   Nausea and vomiting in adult   Hypertension   Discharge Condition: Improved & Stable  Diet recommendation: Regular diet.  Filed Weights   01/13/13 1840  Weight: 56.6 kg (124 lb 12.5 oz)    History of present illness:  32 year old AA female with history of tobacco abuse, alcohol use, HTN on no medications was admitted on 01/13/13 with history of nausea and vomiting after alcohol binge on the weekend. She indicates that she works and hence does not drink on week days but on weekends she may drink more than she should. In the ED, she was found to be in A. fib with RVR and was admitted for further evaluation and management. She denies past history of A. fib.  Hospital Course:  1. New onset A. fib with RVR: Admitted to telemetry. Given a bolus of IV Cardizem which improved rate control. She was then started on Cardizem by mouth. She reverted to sinus rhythm within a short time. Cardiology was consulted. Echo looks unremarkable. Cardiology have switched her to Cardizem CD 180 mg daily and have cleared her for discharge home on Cardizem and aspirin 325. She will follow with them as outpatient. TSH normal. CHADS 2 score: 0-1. 2. Nausea and vomiting: Likely secondary to alcohol binge. Resolved. Tolerating diet. Counseled regarding moderation/abstinence. 3. Tobacco abuse: Cessation counseled. 4. Hypokalemia: Repleted prior to discharge. 5. Leukocytosis: Likely stress margination. No clinical focus of sepsis. Resolved. 6. Alcohol abuse: Counseled regarding  moderation/abstinence.  Procedures:  None   Consultations:  Cardiology: [facility]  Discharge Exam:  Complaints: Denies complaints. No nausea, vomiting, abdominal pain, chest pain, SOB or palpitations.  Filed Vitals:   01/13/13 1800 01/13/13 1840 01/13/13 2053 01/14/13 0548  BP: 115/90 114/72 117/87 113/78  Pulse: 46 95 93 77  Temp:  99 F (37.2 C) 98.8 F (37.1 C) 98 F (36.7 C)  TempSrc:  Oral Oral Oral  Resp: 18 20 20 16   Height:  5' (1.524 m)    Weight:  56.6 kg (124 lb 12.5 oz)    SpO2: 100% 100% 98% 99%     General exam: Comfortable.  Respiratory system: Clear. No increased work of breathing.  Cardiovascular system: S1 and S2 heard, RRR. No JVD, murmurs or pedal edema. Telemetry: Sinus rhythm  Gastrointestinal system: Abdomen is nondistended, soft and nontender. Normal bowel sounds heard.  Central nervous system: Alert and oriented. No focal neurological deficits.  Extremities: Symmetric 5 x 5 power.  Discharge Instructions      Discharge Orders   Future Orders Complete By Expires     Activity as tolerated - No restrictions  As directed     Call MD for:  persistant nausea and vomiting  As directed     Call MD for:  As directed     Comments:      Palpitations.    Diet general  As directed         Medication List    TAKE these medications       aspirin 325 MG EC tablet  Take 1 tablet (325 mg total) by mouth  daily.     diltiazem 180 MG 24 hr capsule  Commonly known as:  CARDIZEM CD  Take 1 capsule (180 mg total) by mouth daily.     MUCINEX PO  Take 1 tablet by mouth once.       Follow-up Information   Follow up with Wilburt Finlay, PA On 01/23/2013. (11:20 AM)    Contact information:   [address] [place] [address] [phone]       Follow up with Primary Medical Doctor. Schedule an appointment as soon as possible for a visit in 2 weeks.       The results of significant diagnostics from this  hospitalization (including imaging, microbiology, ancillary and laboratory) are listed below for reference.    Significant Diagnostic Studies: Dg Chest 2 View  01/13/2013  *RADIOLOGY REPORT*  Clinical Data: Dizziness.  CHEST - 2 VIEW  Comparison: None.  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  Otherwise cardiomediastinal silhouette appears normal. No acute pulmonary disease is noted.  Bony thorax is intact.  IMPRESSION: No acute cardiopulmonary abnormality seen.   Original Report Authenticated By: Lupita Raider.,  M.D.    2-D echo Study Conclusions  - Left ventricle: The cavity size was normal. Systolic function was normal. The estimated ejection fraction was in the range of 55% to 60%. Wall motion was normal; there were no regional wall motion abnormalities. Left ventricular diastolic function parameters were normal. - Aortic valve: Mild regurgitation.   Microbiology: No results found for this or any previous visit (from the past 240 hour(s)).   Labs: Basic Metabolic Panel:  Recent Labs Lab 01/13/13 1132 01/13/13 2000 01/14/13 0510  NA 140  --  138  K 3.8  --  3.1*  CL 103  --  106  CO2 24  --  22  GLUCOSE 98  --  84  BUN 7  --  7  CREATININE 0.41* 0.50 0.52  CALCIUM 9.8  --  8.7  MG  --   --  1.9   Liver Function Tests:  Recent Labs Lab 01/13/13 1132 01/14/13 0510  AST 27 21  ALT 41* 31  ALKPHOS 70 55  BILITOT 0.7 0.4  PROT 8.4* 6.3  ALBUMIN 4.6 3.4*    Recent Labs Lab 01/13/13 1132  LIPASE 49   No results found for this basename: AMMONIA,  in the last 168 hours CBC:  Recent Labs Lab 01/13/13 1132 01/13/13 2000 01/14/13 0510  WBC 12.8* 10.9* 7.0  NEUTROABS 10.5*  --   --   HGB 15.8* 14.9 12.9  HCT 43.6 42.7 37.9  MCV 88.3 90.1 90.2  PLT 299 297 241   Cardiac Enzymes:  Recent Labs Lab 01/13/13 1132 01/13/13 1955 01/14/13 0030 01/14/13 0510  TROPONINI <0.30 <0.30 <0.30 <0.30   BNP: BNP (last 3 results) No  results found for this basename: PROBNP,  in the last 8760 hours CBG: No results found for this basename: GLUCAP,  in the last 168 hours  Additional labs:  D dimer: < 0.27  Urine pregnancy test: Negative.  TSH: 0.585  UA: Not indicative of UTI.  Blood alcohol level: <11  UDS: Positive for opiates.    Signed:  Ileana Chalupa  [facility] 01/14/2013, 1:41 PM

## 2013-01-14 NOTE — Progress Notes (Signed)
[facility]  Subjective: No complaints  Objective: Vital signs in last 24 hours: Temp:  [97.9 F (36.6 C)-99 F (37.2 C)] 98 F (36.7 C) (02/10 0548) Pulse Rate:  [46-135] 77 (02/10 0548) Resp:  [14-26] 16 (02/10 0548) BP: (91-128)/(59-91) 113/78 mmHg (02/10 0548) SpO2:  [97 %-100 %] 99 % (02/10 0548) Weight:  [56.6 kg (124 lb 12.5 oz)] 56.6 kg (124 lb 12.5 oz) (02/09 1840) Last BM Date: 01/13/13  Intake/Output from previous day: 02/09 0701 - 02/10 0700 In: 1918.8 [I.V.:1918.8] Out: -  Intake/Output this shift:    Medications Current Facility-Administered Medications  Medication Dose Route Frequency Provider Last Rate Last Dose  . 0.9 %  sodium chloride infusion   Intravenous Continuous Richarda Overlie, MD 75 mL/hr at 01/14/13 0749    . acetaminophen (TYLENOL) tablet 650 mg  650 mg Oral Q6H PRN Richarda Overlie, MD   650 mg at 01/13/13 2259   Or  . acetaminophen (TYLENOL) suppository 650 mg  650 mg Rectal Q6H PRN Richarda Overlie, MD      . aspirin EC tablet 325 mg  325 mg Oral Daily Richarda Overlie, MD   325 mg at 01/14/13 0933  . diltiazem (CARDIZEM) tablet 60 mg  60 mg Oral Q8H Richarda Overlie, MD   60 mg at 01/14/13 0606  . enoxaparin (LOVENOX) injection 40 mg  40 mg Subcutaneous QHS Richarda Overlie, MD   40 mg at 01/13/13 2305  . guaiFENesin (MUCINEX) 12 hr tablet 600 mg  600 mg Oral BID Richarda Overlie, MD   600 mg at 01/14/13 0933  . levalbuterol (XOPENEX) nebulizer solution 0.63 mg  0.63 mg Nebulization Q6H PRN Richarda Overlie, MD      . ondansetron (ZOFRAN) tablet 4 mg  4 mg Oral Q6H PRN Richarda Overlie, MD       Or  . ondansetron (ZOFRAN) injection 4 mg  4 mg Intravenous Q6H PRN Richarda Overlie, MD   4 mg at 01/13/13 2300  . pantoprazole (PROTONIX) EC tablet 40 mg  40 mg Oral Daily Richarda Overlie, MD   40 mg at 01/14/13 0933  . sodium chloride (OCEAN) 0.65 % nasal spray 1 spray  1 spray Each Nare PRN Jinger Neighbors, NP      . sodium chloride 0.9 % injection 3 mL  3 mL  Intravenous Q12H Richarda Overlie, MD        PE: General appearance: alert, cooperative and no distress Lungs: clear to auscultation bilaterally Heart: regular rate and rhythm and 1/6 Sys MM at LSB Extremities: No LEE Pulses: 2+ and symmetric Skin: Warm and dry. Neurologic: Grossly normal  Lab Results:   Recent Labs  01/13/13 1132 01/13/13 2000 01/14/13 0510  WBC 12.8* 10.9* 7.0  HGB 15.8* 14.9 12.9  HCT 43.6 42.7 37.9  PLT 299 297 241   BMET  Recent Labs  01/13/13 1132 01/13/13 2000 01/14/13 0510  NA 140  --  138  K 3.8  --  3.1*  CL 103  --  106  CO2 24  --  22  GLUCOSE 98  --  84  BUN 7  --  7  CREATININE 0.41* 0.50 0.52  CALCIUM 9.8  --  8.7      Assessment/Plan  Principal Problem:   Atrial fibrillation, new Active Problems:   Nausea and vomiting in adult   Hypertension  Plan:  Maintaining NSR.  BP and HR stable and controlled.  Replace potassium.  2D Echo completed waiting  for read.  ASA, Diltiazem.  Likely DC home later if echo ok.   LOS: 1 day    HAGER, BRYAN 01/14/2013 10:34 AM  I have seen and examined the patient along with HAGER, BRYAN, PA.  I have reviewed the chart, notes and new data.  I agree with PA's note.  Now asymptomatic, with normal exam and NSR consistently maintained. Echo shows no evidence of any meaningful structural abnormalities.  PLAN: DC home. Avoid binge alcohol consumption.  Thurmon Fair, MD, [facility] [phone] 01/14/2013, 2:18 PM

## 2013-01-14 NOTE — Care Management (Signed)
CARE MANAGE MENT UTILIZATION REVIEW NOTE 01/14/2013     Patient:  [name], [name]   Account Number:  [id number]  Documented by:  Roxy Manns Matai Carpenito   Per Ur Regulation Reviewed for med. necessity/level of care/duration of stay

## 2013-01-14 NOTE — Progress Notes (Signed)
  Echocardiogram 2D Echocardiogram has been performed.  Georgian Co 01/14/2013, 11:30 AM

## 2013-01-14 NOTE — Care Management Note (Signed)
    Page 1 of 1   01/14/2013     2:51:06 PM   CARE MANAGEMENT NOTE 01/14/2013  Patient:  [name], [name]   Account Number:  [id number]  Date Initiated:  01/14/2013  Documentation initiated by:  DAVIS,TYMEEKA  Subjective/Objective Assessment:   32 yo female admitted with new onset atrial fib. PTA pt independent. No PCP.     Action/Plan:   Home when stable   Anticipated DC Date:  01/14/2013   Anticipated DC Plan:  HOME/SELF CARE      DC Planning Services  CM consult  Medication Assistance  MATCH Program      Choice offered to / List presented to:             Status of service:  Completed, signed off Medicare Important Message given?   (If response is "NO", the following Medicare IM given date fields will be blank) Date Medicare IM given:   Date Additional Medicare IM given:    Discharge Disposition:  HOME/SELF CARE  Per UR Regulation:  Reviewed for med. necessity/level of care/duration of stay  If discussed at Long Length of Stay Meetings, dates discussed:    Comments:  01-14-13 1437 Tomi Bamberger, RN,BSN [phone] CM did speak to pt and she has family medicaid, however it will not pay for mrdications. CM utilized the [facility] PROGRAM for pt. CM also provided pt with [facility] informaiton and the [facility] #. No further needs from CM at this time.   2.10.14 1400 Tymeeka Davis,RN,BSN [phone]

## 2013-01-15 ENCOUNTER — Encounter (HOSPITAL_COMMUNITY): Payer: Self-pay | Admitting: Emergency Medicine

## 2013-01-15 ENCOUNTER — Emergency Department (HOSPITAL_COMMUNITY)
Admission: EM | Admit: 2013-01-15 | Discharge: 2013-01-15 | Disposition: A | Discharge status: Home / Self Care | Payer: Medicaid Other | Attending: Emergency Medicine | Admitting: Emergency Medicine

## 2013-01-15 DIAGNOSIS — Z7982 Long term (current) use of aspirin: Secondary | ICD-10-CM | POA: Insufficient documentation

## 2013-01-15 DIAGNOSIS — Z8669 Personal history of other diseases of the nervous system and sense organs: Secondary | ICD-10-CM | POA: Insufficient documentation

## 2013-01-15 DIAGNOSIS — R599 Enlarged lymph nodes, unspecified: Secondary | ICD-10-CM | POA: Insufficient documentation

## 2013-01-15 DIAGNOSIS — Z79899 Other long term (current) drug therapy: Secondary | ICD-10-CM | POA: Insufficient documentation

## 2013-01-15 DIAGNOSIS — H66019 Acute suppurative otitis media with spontaneous rupture of ear drum, unspecified ear: Secondary | ICD-10-CM | POA: Insufficient documentation

## 2013-01-15 DIAGNOSIS — H9190 Unspecified hearing loss, unspecified ear: Secondary | ICD-10-CM | POA: Insufficient documentation

## 2013-01-15 DIAGNOSIS — Z87720 Personal history of (corrected) congenital malformations of eye: Secondary | ICD-10-CM | POA: Insufficient documentation

## 2013-01-15 DIAGNOSIS — F17200 Nicotine dependence, unspecified, uncomplicated: Secondary | ICD-10-CM | POA: Insufficient documentation

## 2013-01-15 DIAGNOSIS — I10 Essential (primary) hypertension: Secondary | ICD-10-CM | POA: Insufficient documentation

## 2013-01-15 DIAGNOSIS — Z8742 Personal history of other diseases of the female genital tract: Secondary | ICD-10-CM | POA: Insufficient documentation

## 2013-01-15 DIAGNOSIS — Z87440 Personal history of urinary (tract) infections: Secondary | ICD-10-CM | POA: Insufficient documentation

## 2013-01-15 DIAGNOSIS — J3489 Other specified disorders of nose and nasal sinuses: Secondary | ICD-10-CM | POA: Insufficient documentation

## 2013-01-15 MED ORDER — ONDANSETRON 4 MG PO TBDP
4.0000 mg | ORAL_TABLET | Freq: Once | ORAL | Status: AC
  Administered 2013-01-15: 4 mg via ORAL
  Filled 2013-01-15: qty 1

## 2013-01-15 MED ORDER — AMOXICILLIN-POT CLAVULANATE 875-125 MG PO TABS: 1.0000 | ORAL_TABLET | Freq: Two times a day (BID) | ORAL | Status: DC

## 2013-01-15 MED ORDER — HYDROCODONE-ACETAMINOPHEN 5-325 MG PO TABS: 1.0000 | ORAL_TABLET | Freq: Four times a day (QID) | ORAL | Status: DC | PRN

## 2013-01-15 MED ORDER — HYDROMORPHONE HCL PF 2 MG/ML IJ SOLN
2.0000 mg | Freq: Once | INTRAMUSCULAR | Status: AC
  Administered 2013-01-15: 2 mg via INTRAMUSCULAR
  Filled 2013-01-15: qty 1

## 2013-01-15 MED ORDER — DILTIAZEM HCL 90 MG PO TABS
180.0000 mg | ORAL_TABLET | Freq: Once | ORAL | Status: AC
  Administered 2013-01-15: 180 mg via ORAL
  Filled 2013-01-15: qty 2

## 2013-01-15 MED ORDER — AMOXICILLIN-POT CLAVULANATE 875-125 MG PO TABS
1.0000 | ORAL_TABLET | Freq: Once | ORAL | Status: AC
  Administered 2013-01-15: 1 via ORAL
  Filled 2013-01-15: qty 1

## 2013-01-15 NOTE — ED Notes (Signed)
States that she has right ear pain since yesterday. States that she has dark brown drainage.

## 2013-01-15 NOTE — ED Provider Notes (Signed)
History     CSN: [id number]  Arrival date & time 01/15/13  1314   First MD Initiated Contact with Patient 01/15/13 1349      Chief Complaint  Patient presents with  . Otalgia    (Consider location/radiation/quality/duration/timing/severity/associated sxs/prior treatment) HPI 32 year old female presents emergency department with chief complaint of right ear pain and drainage.  Patient was released yesterday from the hospital with new diagnosis of atrial fibrillation flutter episode of nausea vomiting and diarrhea.  Patient states she was unable to obtain her Cardizem yesterday and has not taken it but has not felt any racing heart.  Patient states that she has had symptoms of upper respiratory infection including sinus pressure and pain for the past week.  She's had some decreased hearing in the right ear over several weeks.  Patient states that this morning she began developing severe right ear pain.  She heard a pop and then had drainage from the right ear.  She complains of severe pain continued in the ear with a roaring sound.  Patient has a history of cleft palate repair and has had problems with sinus infection and otitis media for many years.  She has not had an ear infection since she was a teenager however.  Patient denies any fever, chills, nausea, vomiting or vertigo.   Past Medical History  Diagnosis Date  . Ectopic pregnancy   . Urinary tract infection   . Ovarian cyst   . Hypertension     was told, but never on meds    Past Surgical History  Procedure Laterality Date  . Etopical surgery    . Cleft palate repair    . Laparoscopy for ectopic pregnancy  2011    Family History  Problem Relation Age of Onset  . Other Neg Hx     History  Substance Use Topics  . Smoking status: Current Every Day Smoker -- 0.25 packs/day for 20 years    Types: Cigarettes  . Smokeless tobacco: Never Used  . Alcohol Use: Yes     Comment: OCCASIONAL    OB History   Grav Para Term  Preterm Abortions TAB SAB Ect Mult Living   2    2 0 1 1 0 0      Review of Systems Ten systems reviewed and are negative for acute change, except as noted in the HPI.   Allergies  Review of patient's allergies indicates no known allergies.  Home Medications   Current Outpatient Rx  Name  Route  Sig  Dispense  Refill  . aspirin 325 MG EC tablet   Oral   Take 1 tablet (325 mg total) by mouth daily.   34 tablet   0   . diltiazem (CARDIZEM CD) 180 MG 24 hr capsule   Oral   Take 1 capsule (180 mg total) by mouth daily.   34 capsule   0   . guaiFENesin (MUCINEX) 600 MG 12 hr tablet   Oral   Take 1,200 mg by mouth 2 (two) times daily.           BP 125/80  Pulse 92  Temp(Src) 98.7 F (37.1 C) (Oral)  Resp 17  SpO2 99%  LMP 12/17/2012  Physical Exam Physical Exam  Nursing note and vitals reviewed. Constitutional: She is oriented to person, place, and time. She appears well-developed and well-nourished.  She appears distressed.  She is tearful during examination. HENT:  Head: Normocephalic and atraumatic.  Eyes: Conjunctivae normal and EOM are  normal. Pupils are equal, round, and reactive to light. No scleral icterus.  Ears: Patient with normal TM on the left ear.  Right ear TM is not visible and likely ruptured.  There is purulent discharge in the canal.  No signs of external otitis.  No tragal tenderness or mastoid tenderness. Pre-or post auricular adenopathy. Neck: Normal range of motion.  Cardiovascular: Normal rate, regular rhythm and normal heart sounds.  Exam reveals no gallop and no friction rub.   No murmur heard. Pulmonary/Chest: Effort normal and breath sounds normal. No respiratory distress.  Abdominal: Soft. Bowel sounds are normal. She exhibits no distension and no mass. There is no tenderness. There is no guarding.  Neurological: She is alert and oriented to person, place, and time.  Skin: Skin is warm and dry. She is not diaphoretic.    ED Course   Procedures (including critical care time)  Labs Reviewed - No data to display Dg Chest 2 View  01/13/2013  *RADIOLOGY REPORT*  Clinical Data: Dizziness.  CHEST - 2 VIEW  Comparison: None.  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  Otherwise cardiomediastinal silhouette appears normal. No acute pulmonary disease is noted.  Bony thorax is intact.  IMPRESSION: No acute cardiopulmonary abnormality seen.   Original Report Authenticated By: Lupita Raider.,  M.D.      1. Rupture of tympanic membrane with acute suppurative otitis media, right       MDM  Patient redosed with her cardizem. Will d/c with augmentin and pain control and ENT follow up. First dose given as patient will not be able to obtain her medications until tomorrow.  No concern for acute mastoiditis, meningitis.     I have also discussed reasons to return immediately to the ER.  Parent expresses understanding and agrees with plan.           Arthor Captain, PA-C 01/18/13 [phone]

## 2013-01-18 NOTE — ED Provider Notes (Signed)
Medical screening examination/treatment/procedure(s) were performed by non-physician practitioner and as supervising physician I was immediately available for consultation/collaboration.  Donnetta Hutching, MD 01/18/13 2044

## 2013-01-28 ENCOUNTER — Encounter (HOSPITAL_COMMUNITY): Payer: Self-pay | Admitting: Emergency Medicine

## 2013-01-28 ENCOUNTER — Emergency Department (HOSPITAL_COMMUNITY)
Admission: EM | Admit: 2013-01-28 | Discharge: 2013-01-28 | Disposition: A | Discharge status: Home / Self Care | Payer: Medicaid Other | Attending: Emergency Medicine | Admitting: Emergency Medicine

## 2013-01-28 ENCOUNTER — Emergency Department (HOSPITAL_COMMUNITY): Payer: Medicaid Other

## 2013-01-28 DIAGNOSIS — Y929 Unspecified place or not applicable: Secondary | ICD-10-CM | POA: Insufficient documentation

## 2013-01-28 DIAGNOSIS — Z87440 Personal history of urinary (tract) infections: Secondary | ICD-10-CM | POA: Insufficient documentation

## 2013-01-28 DIAGNOSIS — I10 Essential (primary) hypertension: Secondary | ICD-10-CM | POA: Insufficient documentation

## 2013-01-28 DIAGNOSIS — Z8742 Personal history of other diseases of the female genital tract: Secondary | ICD-10-CM | POA: Insufficient documentation

## 2013-01-28 DIAGNOSIS — X500XXA Overexertion from strenuous movement or load, initial encounter: Secondary | ICD-10-CM | POA: Insufficient documentation

## 2013-01-28 DIAGNOSIS — S93409A Sprain of unspecified ligament of unspecified ankle, initial encounter: Secondary | ICD-10-CM | POA: Insufficient documentation

## 2013-01-28 DIAGNOSIS — S93401A Sprain of unspecified ligament of right ankle, initial encounter: Secondary | ICD-10-CM

## 2013-01-28 DIAGNOSIS — O0910 Supervision of pregnancy with history of ectopic or molar pregnancy, unspecified trimester: Secondary | ICD-10-CM | POA: Insufficient documentation

## 2013-01-28 DIAGNOSIS — Y939 Activity, unspecified: Secondary | ICD-10-CM | POA: Insufficient documentation

## 2013-01-28 DIAGNOSIS — F17200 Nicotine dependence, unspecified, uncomplicated: Secondary | ICD-10-CM | POA: Insufficient documentation

## 2013-01-28 MED ORDER — HYDROCODONE-ACETAMINOPHEN 5-325 MG PO TABS: 1.0000 | ORAL_TABLET | Freq: Four times a day (QID) | ORAL | Status: DC | PRN

## 2013-01-28 MED ORDER — FENTANYL CITRATE 0.05 MG/ML IJ SOLN
50.0000 ug | Freq: Once | INTRAMUSCULAR | Status: AC
  Administered 2013-01-28: 50 ug via INTRAVENOUS
  Filled 2013-01-28: qty 2

## 2013-01-28 MED ORDER — ONDANSETRON HCL 4 MG/2ML IJ SOLN
4.0000 mg | Freq: Once | INTRAMUSCULAR | Status: AC
  Administered 2013-01-28: 4 mg via INTRAVENOUS
  Filled 2013-01-28: qty 2

## 2013-01-28 MED ORDER — IBUPROFEN 600 MG PO TABS: 600.0000 mg | ORAL_TABLET | Freq: Four times a day (QID) | ORAL | Status: DC | PRN

## 2013-01-28 NOTE — ED Notes (Signed)
Brought in by EMS from a friend's home after her fall with subsequent immediate pain to right ankle.  Per EMS:  Pt was ambulating when she tripped and fell-- pt reports that she might have sprained her right ankle, pt sustained pain and swelling to right ankle after her fall; pt was given Toradol 30 mg IV en route to ED.

## 2013-01-28 NOTE — ED Notes (Signed)
NWG:[id number]<ZH> Expected date:01/28/13<BR> Expected time: 7:56 PM<BR> Means of arrival:Ambulance<BR> Comments:<BR> Ankle injury

## 2013-01-28 NOTE — ED Provider Notes (Signed)
History     CSN: [id number]  Arrival date & time 01/28/13  2010   First MD Initiated Contact with Patient 01/28/13 2023      Chief Complaint  Patient presents with  . Ankle Pain    (Consider location/radiation/quality/duration/timing/severity/associated sxs/prior treatment) HPI Pt presents with c/o right ankle pain after twisting her ankle just prior to arrival.  Pain is constant and severe.  No knee pain.  Did not fall and strike head, denies neck or back pain.  Pain is worse with movement and palpation.  Pt received IV toradol  Via EMS which she states did not help the pain much.  There are no other associated systemic symptoms, there are no other alleviating or modifying factors.   Past Medical History  Diagnosis Date  . Ectopic pregnancy   . Urinary tract infection   . Ovarian cyst   . Hypertension     was told, but never on meds    Past Surgical History  Procedure Laterality Date  . Etopical surgery    . Cleft palate repair    . Laparoscopy for ectopic pregnancy  2011    Family History  Problem Relation Age of Onset  . Other Neg Hx     History  Substance Use Topics  . Smoking status: Current Every Day Smoker -- 0.25 packs/day for 20 years    Types: Cigarettes  . Smokeless tobacco: Never Used  . Alcohol Use: Yes     Comment: OCCASIONAL    OB History   Grav Para Term Preterm Abortions TAB SAB Ect Mult Living   2    2 0 1 1 0 0      Review of Systems ROS reviewed and all otherwise negative except for mentioned in HPI  Allergies  Review of patient's allergies indicates no known allergies.  Home Medications   Current Outpatient Rx  Name  Route  Sig  Dispense  Refill  . diltiazem (CARDIZEM CD) 180 MG 24 hr capsule   Oral   Take 1 capsule (180 mg total) by mouth daily.   34 capsule   0   . amoxicillin-clavulanate (AUGMENTIN) 875-125 MG per tablet   Oral   Take 1 tablet by mouth 2 (two) times daily. One po bid x 7 days   14 tablet   0   .  HYDROcodone-acetaminophen (NORCO) 5-325 MG per tablet   Oral   Take 1 tablet by mouth every 6 (six) hours as needed for pain.   30 tablet   0   . ibuprofen (ADVIL,MOTRIN) 600 MG tablet   Oral   Take 1 tablet (600 mg total) by mouth every 6 (six) hours as needed for pain.   30 tablet   0     BP 113/74  Pulse 81  Temp(Src) 98.4 F (36.9 C) (Oral)  Resp 16  SpO2 99%  LMP 09/18/2012 Vitals reviewed Physical Exam Physical Examination: General appearance - alert, well appearing, and in no distress Mental status - alert, oriented to person, place, and time Neck - supple, no significant adenopathy Chest - clear to auscultation, no wheezes, rales or rhonchi, symmetric air entry Heart - normal rate, regular rhythm, normal S1, S2, no murmurs, rubs, clicks or gallops Back exam - full range of motion, no tenderness, palpable spasm or pain on motion Neurological - alert, oriented, normal speech, strength/sensation intact in 4 extremities distally Musculoskeletal - ttp over lateral malleolus, no ttp or pain with ROM of knee, no fibular head tenderness,  no deformity or swelling Extremities - peripheral pulses normal, no pedal edema, no clubbing or cyanosis, brisk cap refill distally with sensation intact in right foot/toes Skin - normal coloration and turgor, no rashes  ED Course  Procedures (including critical care time)  Labs Reviewed - No data to display Dg Ankle Complete Right  01/28/2013  *RADIOLOGY REPORT*  Clinical Data: Right ankle pain and swelling after twisting injury today.  RIGHT ANKLE - COMPLETE 3+ VIEW  Comparison: None.  Findings: Mild soft tissue swelling about the right ankle.  No evidence of acute fracture or subluxation.  No focal bone lesion or bone destruction.  Bone cortex and trabecular architecture appear intact.  No radiopaque soft tissue foreign bodies.  IMPRESSION: Cough tissue swelling.  No displaced fractures identified.   Original Report Authenticated By: Burman Nieves, M.D.      1. Ankle sprain, right, initial encounter       MDM  Pt presenting with pain in right ankle after twisting injury.  Xrays reassuring.  No neck or back pain or knee pain.  Pt treated with pain meds in ED.  Pt placed on ASO, given crutches- discharged with information for ortho followup and pain meds.  Discharged with strict return precautions.  Pt agreeable with plan.        Ethelda Chick, MD 01/28/13 2350

## 2013-03-08 ENCOUNTER — Ambulatory Visit: Payer: Self-pay | Admitting: Family Medicine

## 2013-05-28 ENCOUNTER — Inpatient Hospital Stay (HOSPITAL_COMMUNITY)
Admission: AD | Admit: 2013-05-28 | Discharge: 2013-05-28 | Disposition: A | Discharge status: Home / Self Care | Payer: Self-pay | Source: Ambulatory Visit | Attending: Obstetrics & Gynecology | Admitting: Obstetrics & Gynecology

## 2013-05-28 ENCOUNTER — Encounter (HOSPITAL_COMMUNITY): Payer: Self-pay | Admitting: *Deleted

## 2013-05-28 DIAGNOSIS — R109 Unspecified abdominal pain: Secondary | ICD-10-CM | POA: Insufficient documentation

## 2013-05-28 DIAGNOSIS — N926 Irregular menstruation, unspecified: Secondary | ICD-10-CM | POA: Insufficient documentation

## 2013-05-28 DIAGNOSIS — N946 Dysmenorrhea, unspecified: Secondary | ICD-10-CM | POA: Insufficient documentation

## 2013-05-28 HISTORY — DX: Cardiac arrhythmia, unspecified: I49.9

## 2013-05-28 HISTORY — DX: Unspecified infectious disease: B99.9

## 2013-05-28 HISTORY — DX: Depression, unspecified: F32.A

## 2013-05-28 HISTORY — DX: Major depressive disorder, single episode, unspecified: F32.9

## 2013-05-28 LAB — URINALYSIS, ROUTINE W REFLEX MICROSCOPIC
Glucose, UA: NEGATIVE mg/dL
Leukocytes, UA: NEGATIVE
Protein, ur: NEGATIVE mg/dL
Specific Gravity, Urine: >1.03 — ABNORMAL HIGH (ref 1.005–1.030)
pH: 5.5 (ref 5.0–8.0)

## 2013-05-28 LAB — URINE MICROSCOPIC-ADD ON

## 2013-05-28 LAB — CBC
HCT: 39.3 % (ref 36.0–46.0)
Hemoglobin: 13.7 g/dL (ref 12.0–15.0)
MCHC: 34.9 g/dL (ref 30.0–36.0)
RBC: 4.36 MIL/uL (ref 3.87–5.11)

## 2013-05-28 LAB — POCT PREGNANCY, URINE: Preg Test, Ur: NEGATIVE

## 2013-05-28 LAB — WET PREP, GENITAL: Yeast Wet Prep HPF POC: NONE SEEN

## 2013-05-28 MED ORDER — KETOROLAC TROMETHAMINE 60 MG/2ML IM SOLN
60.0000 mg | INTRAMUSCULAR | Status: AC
  Administered 2013-05-28: 60 mg via INTRAMUSCULAR
  Filled 2013-05-28: qty 2

## 2013-05-28 NOTE — MAU Note (Signed)
Patient states she did not have a period for 8 months the on 5-28 had a period that lasted 8 days and was heavy and painful. Bleeding started again on 6-23 and having pain.

## 2013-05-28 NOTE — MAU Provider Note (Signed)
Attestation of Attending Supervision of Advanced Practitioner (PA/CNM/NP): Evaluation and management procedures were performed by the Advanced Practitioner under my supervision and collaboration.  I have reviewed the Advanced Practitioner's note and chart, and I agree with the management and plan.  Stanlee Roehrig, MD, FACOG Attending Obstetrician & Gynecologist Faculty Practice, [facility]  

## 2013-05-28 NOTE — MAU Note (Signed)
Bleeding and pain started this morning.  Had not had period for 8 months,  Had period in May was similar but not as painful.

## 2013-05-28 NOTE — MAU Provider Note (Signed)
Chief Complaint: Vaginal Bleeding and Abdominal Pain   First Provider Initiated Contact with Patient 05/28/13 1403     SUBJECTIVE HPI: [name] is a 32 y.o. G2P0020 at who presents to maternity admissions reporting abdominal pain and heavy vaginal bleeding starting yesterday.  She has recent history of amenorrhea x 8 months followed by menses 1 month ago. She reports denies vaginal itching/burning, urinary symptoms, h/a, dizziness, n/v, or fever/chills.    Pt goes to [facility] for regular gyn care.   Past Medical History  Diagnosis Date  . Ectopic pregnancy   . Urinary tract infection   . Ovarian cyst   . Hypertension     was told, but never on meds  . Dysrhythmia     hx of a-fib  . Infection     UTI  . Depression     ok now   Past Surgical History  Procedure Laterality Date  . Etopical surgery    . Cleft palate repair    . Laparoscopy for ectopic pregnancy  2011   History   Social History  . Marital Status: Single    Spouse Name: N/A    Number of Children: N/A  . Years of Education: N/A   Occupational History  . Not on file.   Social History Main Topics  . Smoking status: Current Every Day Smoker -- 0.25 packs/day for 15 years    Types: Cigarettes  . Smokeless tobacco: Never Used  . Alcohol Use: Yes     Comment: OCCASIONAL  . Drug Use: No  . Sexually Active: Yes    Birth Control/ Protection: None   Other Topics Concern  . Not on file   Social History Narrative  . No narrative on file   No current facility-administered medications on file prior to encounter.   Current Outpatient Prescriptions on File Prior to Encounter  Medication Sig Dispense Refill  . diltiazem (CARDIZEM CD) 180 MG 24 hr capsule Take 1 capsule (180 mg total) by mouth daily.  34 capsule  0   No Known Allergies  ROS: Pertinent items in HPI  OBJECTIVE Blood pressure 131/88, pulse 87, temperature 97.4 F (36.3 C), temperature source Oral, resp. rate 20, height 5'  1" (1.549 m), weight 55.43 kg (122 lb 3.2 oz), last menstrual period 05/27/2013, SpO2 100.00%. GENERAL: Well-developed, well-nourished female in no acute distress.  HEENT: Normocephalic HEART: normal rate RESP: normal effort ABDOMEN: Soft, non-tender EXTREMITIES: Nontender, no edema NEURO: Alert and oriented Pelvic exam: Cervix pink, visually closed, without lesion, moderate amount dark red vaginal bleeding without clots, vaginal walls and external genitalia normal Bimanual exam: Cervix 0/long/high, firm, posterior, neg CMT, uterus mildly, nonenlarged, adnexa without tenderness, enlargement, or mass  LAB RESULTS Results for orders placed during the hospital encounter of 05/28/13 (from the past 24 hour(s))  CBC     Status: Abnormal   Collection Time    05/28/13 11:51 AM      Result Value Range   WBC 12.3 (*) 4.0 - 10.5 K/uL   RBC 4.36  3.87 - 5.11 MIL/uL   Hemoglobin 13.7  12.0 - 15.0 g/dL   HCT 40.1  02.7 - 25.3 %   MCV 90.1  78.0 - 100.0 fL   MCH 31.4  26.0 - 34.0 pg   MCHC 34.9  30.0 - 36.0 g/dL   RDW 66.4  40.3 - 47.4 %   Platelets 324  150 - 400 K/uL  URINALYSIS, ROUTINE W REFLEX MICROSCOPIC  Status: Abnormal   Collection Time    05/28/13 12:05 PM      Result Value Range   Color, Urine RED (*) YELLOW   APPearance CLOUDY (*) CLEAR   Specific Gravity, Urine >1.030 (*) 1.005 - 1.030   pH 5.5  5.0 - 8.0   Glucose, UA NEGATIVE  NEGATIVE mg/dL   Hgb urine dipstick LARGE (*) NEGATIVE   Bilirubin Urine NEGATIVE  NEGATIVE   Ketones, ur NEGATIVE  NEGATIVE mg/dL   Protein, ur NEGATIVE  NEGATIVE mg/dL   Urobilinogen, UA 0.2  0.0 - 1.0 mg/dL   Nitrite NEGATIVE  NEGATIVE   Leukocytes, UA NEGATIVE  NEGATIVE  URINE MICROSCOPIC-ADD ON     Status: Abnormal   Collection Time    05/28/13 12:05 PM      Result Value Range   Squamous Epithelial / LPF FEW (*) RARE   WBC, UA 0-2  <3 WBC/hpf   RBC / HPF TOO NUMEROUS TO COUNT  <3 RBC/hpf   Bacteria, UA RARE  RARE  POCT PREGNANCY, URINE      Status: None   Collection Time    05/28/13 12:07 PM      Result Value Range   Preg Test, Ur NEGATIVE  NEGATIVE  WET PREP, GENITAL     Status: Abnormal   Collection Time    05/28/13  3:40 PM      Result Value Range   Yeast Wet Prep HPF POC NONE SEEN  NONE SEEN   Trich, Wet Prep NONE SEEN  NONE SEEN   Clue Cells Wet Prep HPF POC NONE SEEN  NONE SEEN   WBC, Wet Prep HPF POC FEW (*) NONE SEEN    ASSESSMENT 1. Dysmenorrhea   2. Irregular menses     PLAN Toradol 60 mg IM x1 dose in MAU with significant relief of pain per pt Discharge home Ibuprofen PO for pain F/U with [facility]  Return to MAU as needed    Medication List    ASK your doctor about these medications       acetaminophen 500 MG tablet  Commonly known as:  TYLENOL  Take 1,000 mg by mouth every 6 (six) hours as needed for pain.     diltiazem 180 MG 24 hr capsule  Commonly known as:  CARDIZEM CD  Take 1 capsule (180 mg total) by mouth daily.     MIDOL PO  Take 2 tablets by mouth every 8 (eight) hours as needed (for pain).         Sharen Counter Certified Nurse-Midwife 05/28/2013  2:40 PM

## 2013-07-19 ENCOUNTER — Inpatient Hospital Stay (HOSPITAL_COMMUNITY)
Admission: AD | Admit: 2013-07-19 | Discharge: 2013-07-19 | Disposition: A | Discharge status: Home / Self Care | Payer: Self-pay | Source: Ambulatory Visit | Attending: Obstetrics & Gynecology | Admitting: Obstetrics & Gynecology

## 2013-07-19 ENCOUNTER — Encounter (HOSPITAL_COMMUNITY): Payer: Self-pay | Admitting: *Deleted

## 2013-07-19 DIAGNOSIS — N9489 Other specified conditions associated with female genital organs and menstrual cycle: Secondary | ICD-10-CM | POA: Insufficient documentation

## 2013-07-19 DIAGNOSIS — I889 Nonspecific lymphadenitis, unspecified: Secondary | ICD-10-CM

## 2013-07-19 DIAGNOSIS — R109 Unspecified abdominal pain: Secondary | ICD-10-CM | POA: Insufficient documentation

## 2013-07-19 DIAGNOSIS — N949 Unspecified condition associated with female genital organs and menstrual cycle: Secondary | ICD-10-CM

## 2013-07-19 DIAGNOSIS — R29898 Other symptoms and signs involving the musculoskeletal system: Secondary | ICD-10-CM | POA: Insufficient documentation

## 2013-07-19 LAB — WET PREP, GENITAL
Trich, Wet Prep: NONE SEEN
Yeast Wet Prep HPF POC: NONE SEEN

## 2013-07-19 LAB — POCT PREGNANCY, URINE: Preg Test, Ur: NEGATIVE

## 2013-07-19 MED ORDER — ACYCLOVIR 200 MG PO CAPS: 200.0000 mg | ORAL_CAPSULE | Freq: Every day | ORAL | Status: DC

## 2013-07-19 MED ORDER — LIDOCAINE HCL 2 % EX GEL
CUTANEOUS | Status: DC
  Filled 2013-07-19: qty 20

## 2013-07-19 NOTE — MAU Note (Signed)
Patient denies abdominal pain,.

## 2013-07-19 NOTE — MAU Provider Note (Signed)
Attestation of Attending Supervision of Advanced Practitioner (CNM/NP): Evaluation and management procedures were performed by the Advanced Practitioner under my supervision and collaboration.  I have reviewed the Advanced Practitioner's note and chart, and I agree with the management and plan.  HARRAWAY-SMITH, Cissy Galbreath 8:16 PM     

## 2013-07-19 NOTE — MAU Provider Note (Signed)
Chief Complaint: Groin Pain   First Provider Initiated Contact with Patient 07/19/13 1227     SUBJECTIVE HPI: [name] is a 32 y.o. G2P0020 who presents to maternity admissions reporting bumps on labia and a swollen area in her groin which is painful.  These symptoms started 2-3 days ago.  She denies vaginal bleeding, vaginal itching/burning, urinary symptoms, h/a, dizziness, n/v, or fever/chills.    While in MAU she reports weakness in her right hand.  Past Medical History  Diagnosis Date  . Ectopic pregnancy   . Urinary tract infection   . Ovarian cyst   . Hypertension     was told, but never on meds  . Dysrhythmia     hx of a-fib  . Infection     UTI  . Depression     ok now   Past Surgical History  Procedure Laterality Date  . Etopical surgery    . Cleft palate repair    . Laparoscopy for ectopic pregnancy  2011   History   Social History  . Marital Status: Single    Spouse Name: N/A    Number of Children: N/A  . Years of Education: N/A   Occupational History  . Not on file.   Social History Main Topics  . Smoking status: Current Every Day Smoker -- 0.25 packs/day for 15 years    Types: Cigarettes  . Smokeless tobacco: Never Used  . Alcohol Use: Yes     Comment: OCCASIONAL  . Drug Use: No  . Sexual Activity: Yes    Birth Control/ Protection: None   Other Topics Concern  . Not on file   Social History Narrative  . No narrative on file   No current facility-administered medications on file prior to encounter.   Current Outpatient Prescriptions on File Prior to Encounter  Medication Sig Dispense Refill  . acetaminophen (TYLENOL) 500 MG tablet Take 1,000 mg by mouth every 6 (six) hours as needed for pain.      Marland Kitchen diltiazem (CARDIZEM CD) 180 MG 24 hr capsule Take 1 capsule (180 mg total) by mouth daily.  34 capsule  0   No Known Allergies  ROS: Pertinent items in HPI  OBJECTIVE Blood pressure 123/94, pulse 87, temperature 98.4 F (36.9 C),  temperature source Oral, resp. rate 16, height 5' 0.5" (1.537 m), weight 55.43 kg (122 lb 3.2 oz), last menstrual period 06/13/2013. GENERAL: Well-developed, well-nourished female in no acute distress.  HEENT: Normocephalic HEART: normal rate RESP: normal effort ABDOMEN: Soft, non-tender EXTREMITIES: Nontender, no edema NEURO: Alert and oriented Pelvic exam: Cervix pink, visually closed, without lesion, scant white creamy discharge, vaginal walls normal, one hard palpable lump, approximately 0.5 cm in size, with small erythemetous area visible at skin surface, located on mons, underneath hair. Area is extremely tender.  Enlarged inguinal lymph node, ~4cm in size, tender to palpation.  Bimanual exam: Cervix 0/long/high, firm, anterior, neg CMT, uterus nontender, nonenlarged, adnexa without tenderness, enlargement, or mass  LAB RESULTS Results for orders placed during the hospital encounter of 07/19/13 (from the past 24 hour(s))  POCT PREGNANCY, URINE     Status: None   Collection Time    07/19/13 11:12 AM      Result Value Range   Preg Test, Ur NEGATIVE  NEGATIVE  WET PREP, GENITAL     Status: Abnormal   Collection Time    07/19/13 12:20 PM      Result Value Range   Yeast Wet Prep HPF  POC NONE SEEN  NONE SEEN   Trich, Wet Prep NONE SEEN  NONE SEEN   Clue Cells Wet Prep HPF POC FEW (*) NONE SEEN   WBC, Wet Prep HPF POC FEW (*) NONE SEEN    ASSESSMENT 1. Genital lesion, female   2. Inguinal lymphadenitis     PLAN Discharge home HSV culture pending Discussed possibility of HSV vs folliculitis with pt Acyclovir prescribed F/U with [facility] or Gyn provider F/U with primary care for right arm weakness Return to MAU as needed    Medication List         acetaminophen 500 MG tablet  Commonly known as:  TYLENOL  Take 1,000 mg by mouth every 6 (six) hours as needed for pain.     acyclovir 200 MG capsule  Commonly known as:  ZOVIRAX  Take 1 capsule (200 mg total) by mouth 5 (five)  times daily.     diltiazem 180 MG 24 hr capsule  Commonly known as:  CARDIZEM CD  Take 1 capsule (180 mg total) by mouth daily.         Sharen Counter Certified Nurse-Midwife 07/19/2013  12:47 PM

## 2013-07-19 NOTE — MAU Note (Signed)
Patient states she has had a "knot" in the left groin area that hurts for about 3 day. Pump on pubic area that hurts. Denies bleeding or discharge.

## 2013-07-20 LAB — GC/CHLAMYDIA PROBE AMP
CT Probe RNA: NEGATIVE
GC Probe RNA: NEGATIVE

## 2013-07-22 LAB — HERPES SIMPLEX VIRUS CULTURE: Special Requests: NORMAL

## 2013-08-12 ENCOUNTER — Encounter (HOSPITAL_COMMUNITY): Payer: Self-pay | Admitting: Emergency Medicine

## 2013-08-12 ENCOUNTER — Emergency Department (HOSPITAL_COMMUNITY)
Admission: EM | Admit: 2013-08-12 | Discharge: 2013-08-13 | Disposition: A | Discharge status: Home / Self Care | Payer: Self-pay | Attending: Emergency Medicine | Admitting: Emergency Medicine

## 2013-08-12 ENCOUNTER — Emergency Department (HOSPITAL_COMMUNITY): Payer: Self-pay

## 2013-08-12 DIAGNOSIS — F411 Generalized anxiety disorder: Secondary | ICD-10-CM | POA: Insufficient documentation

## 2013-08-12 DIAGNOSIS — F329 Major depressive disorder, single episode, unspecified: Secondary | ICD-10-CM | POA: Insufficient documentation

## 2013-08-12 DIAGNOSIS — Z87440 Personal history of urinary (tract) infections: Secondary | ICD-10-CM | POA: Insufficient documentation

## 2013-08-12 DIAGNOSIS — I4891 Unspecified atrial fibrillation: Secondary | ICD-10-CM | POA: Insufficient documentation

## 2013-08-12 DIAGNOSIS — F4389 Other reactions to severe stress: Secondary | ICD-10-CM | POA: Insufficient documentation

## 2013-08-12 DIAGNOSIS — Z8742 Personal history of other diseases of the female genital tract: Secondary | ICD-10-CM | POA: Insufficient documentation

## 2013-08-12 DIAGNOSIS — M549 Dorsalgia, unspecified: Secondary | ICD-10-CM | POA: Insufficient documentation

## 2013-08-12 DIAGNOSIS — R079 Chest pain, unspecified: Secondary | ICD-10-CM | POA: Insufficient documentation

## 2013-08-12 DIAGNOSIS — F32A Depression, unspecified: Secondary | ICD-10-CM

## 2013-08-12 DIAGNOSIS — R112 Nausea with vomiting, unspecified: Secondary | ICD-10-CM | POA: Insufficient documentation

## 2013-08-12 DIAGNOSIS — R1084 Generalized abdominal pain: Secondary | ICD-10-CM | POA: Insufficient documentation

## 2013-08-12 DIAGNOSIS — K5900 Constipation, unspecified: Secondary | ICD-10-CM | POA: Insufficient documentation

## 2013-08-12 DIAGNOSIS — I10 Essential (primary) hypertension: Secondary | ICD-10-CM | POA: Insufficient documentation

## 2013-08-12 DIAGNOSIS — G8929 Other chronic pain: Secondary | ICD-10-CM | POA: Insufficient documentation

## 2013-08-12 DIAGNOSIS — M25539 Pain in unspecified wrist: Secondary | ICD-10-CM | POA: Insufficient documentation

## 2013-08-12 DIAGNOSIS — M6281 Muscle weakness (generalized): Secondary | ICD-10-CM | POA: Insufficient documentation

## 2013-08-12 DIAGNOSIS — I499 Cardiac arrhythmia, unspecified: Secondary | ICD-10-CM | POA: Insufficient documentation

## 2013-08-12 DIAGNOSIS — R0609 Other forms of dyspnea: Secondary | ICD-10-CM | POA: Insufficient documentation

## 2013-08-12 DIAGNOSIS — F17200 Nicotine dependence, unspecified, uncomplicated: Secondary | ICD-10-CM | POA: Insufficient documentation

## 2013-08-12 DIAGNOSIS — F438 Other reactions to severe stress: Secondary | ICD-10-CM | POA: Insufficient documentation

## 2013-08-12 DIAGNOSIS — Z3202 Encounter for pregnancy test, result negative: Secondary | ICD-10-CM | POA: Insufficient documentation

## 2013-08-12 DIAGNOSIS — Z79899 Other long term (current) drug therapy: Secondary | ICD-10-CM | POA: Insufficient documentation

## 2013-08-12 DIAGNOSIS — R0989 Other specified symptoms and signs involving the circulatory and respiratory systems: Secondary | ICD-10-CM | POA: Insufficient documentation

## 2013-08-12 DIAGNOSIS — M25532 Pain in left wrist: Secondary | ICD-10-CM

## 2013-08-12 DIAGNOSIS — F489 Nonpsychotic mental disorder, unspecified: Secondary | ICD-10-CM | POA: Insufficient documentation

## 2013-08-12 DIAGNOSIS — F3289 Other specified depressive episodes: Secondary | ICD-10-CM | POA: Insufficient documentation

## 2013-08-12 HISTORY — DX: Other chronic pain: G89.29

## 2013-08-12 HISTORY — DX: Dorsalgia, unspecified: M54.9

## 2013-08-12 LAB — URINALYSIS, ROUTINE W REFLEX MICROSCOPIC
Bilirubin Urine: NEGATIVE
Glucose, UA: NEGATIVE mg/dL
Hgb urine dipstick: NEGATIVE
Protein, ur: NEGATIVE mg/dL
Urobilinogen, UA: 0.2 mg/dL (ref 0.0–1.0)

## 2013-08-12 LAB — CBC
HCT: 44.2 % (ref 36.0–46.0)
Hemoglobin: 15.4 g/dL — ABNORMAL HIGH (ref 12.0–15.0)
MCH: 31.8 pg (ref 26.0–34.0)
MCHC: 34.8 g/dL (ref 30.0–36.0)
MCV: 91.3 fL (ref 78.0–100.0)

## 2013-08-12 LAB — BASIC METABOLIC PANEL
BUN: 8 mg/dL (ref 6–23)
Calcium: 10.6 mg/dL — ABNORMAL HIGH (ref 8.4–10.5)
Creatinine, Ser: 0.51 mg/dL (ref 0.50–1.10)
GFR calc non Af Amer: >90 mL/min (ref >90)
Glucose, Bld: 73 mg/dL (ref 70–99)

## 2013-08-12 LAB — URINE MICROSCOPIC-ADD ON

## 2013-08-12 LAB — POCT I-STAT TROPONIN I

## 2013-08-12 MED ORDER — LORAZEPAM 2 MG/ML IJ SOLN
1.0000 mg | Freq: Once | INTRAMUSCULAR | Status: AC
  Administered 2013-08-12: 1 mg via INTRAVENOUS
  Filled 2013-08-12: qty 1

## 2013-08-12 MED ORDER — HYDROCODONE-ACETAMINOPHEN 5-325 MG PO TABS
1.0000 | ORAL_TABLET | Freq: Once | ORAL | Status: AC
  Administered 2013-08-12: 1 via ORAL
  Filled 2013-08-12: qty 1

## 2013-08-12 MED ORDER — KETOROLAC TROMETHAMINE 30 MG/ML IJ SOLN
30.0000 mg | Freq: Once | INTRAMUSCULAR | Status: AC
  Administered 2013-08-12: 30 mg via INTRAVENOUS
  Filled 2013-08-12: qty 1

## 2013-08-12 NOTE — ED Notes (Signed)
PT tearful, anxious, states, "how do I tell if I am depressed? I am crying all the time, I don't think people care about me as much I care about them. I am so sad. I am having trouble mouringninmy father's death. My mother is an alcoholic, my sister passed away too. I just think I would be better off not here sometimes. I don't feel like anyone loves me. I cry all the time. I am just so sad. I don't want my family to know they will think I am crazy. That is why I have not gotten help before" pt tearful, anxious. Dr. Wilkie Aye notified.

## 2013-08-12 NOTE — ED Notes (Signed)
Dinner tray ordered.
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
## 2013-08-12 NOTE — ED Notes (Signed)
Pt sts increased SOB and pain in back and right wrist; pt sts hx of afib but not taking meds; pt appears in reg rhythm at present

## 2013-08-12 NOTE — ED Notes (Signed)
Pt with multiple complaints- left wrist pain for a couple weeks, right hand numbness and low back pain for 2 weeks-- denies any trauma or injury-- states "cannot get big breath--sort of hurts to breath" talking rapidly, anxious-- per patient. States has hx of A Fib-- unable to afford cardizem-- Case Manager -Burna Mortimer, RN notified.

## 2013-08-12 NOTE — ED Notes (Signed)
Patient requested and received apple juice. 

## 2013-08-13 ENCOUNTER — Encounter (HOSPITAL_COMMUNITY): Payer: Self-pay | Admitting: *Deleted

## 2013-08-13 ENCOUNTER — Emergency Department (HOSPITAL_COMMUNITY): Payer: Medicaid Other

## 2013-08-13 ENCOUNTER — Inpatient Hospital Stay (HOSPITAL_COMMUNITY)
Admission: AD | Admit: 2013-08-13 | Discharge: 2013-08-22 | DRG: 885 | Disposition: A | Discharge status: Home / Self Care | Payer: Medicaid Other | Source: Intra-hospital | Attending: Psychiatry | Admitting: Psychiatry

## 2013-08-13 DIAGNOSIS — R45851 Suicidal ideations: Secondary | ICD-10-CM

## 2013-08-13 DIAGNOSIS — I4891 Unspecified atrial fibrillation: Secondary | ICD-10-CM

## 2013-08-13 DIAGNOSIS — F3160 Bipolar disorder, current episode mixed, unspecified: Principal | ICD-10-CM | POA: Diagnosis present

## 2013-08-13 DIAGNOSIS — F314 Bipolar disorder, current episode depressed, severe, without psychotic features: Secondary | ICD-10-CM

## 2013-08-13 DIAGNOSIS — K047 Periapical abscess without sinus: Secondary | ICD-10-CM

## 2013-08-13 DIAGNOSIS — G8929 Other chronic pain: Secondary | ICD-10-CM | POA: Diagnosis present

## 2013-08-13 DIAGNOSIS — Z79899 Other long term (current) drug therapy: Secondary | ICD-10-CM

## 2013-08-13 DIAGNOSIS — F322 Major depressive disorder, single episode, severe without psychotic features: Secondary | ICD-10-CM

## 2013-08-13 DIAGNOSIS — I10 Essential (primary) hypertension: Secondary | ICD-10-CM | POA: Diagnosis present

## 2013-08-13 DIAGNOSIS — M549 Dorsalgia, unspecified: Secondary | ICD-10-CM | POA: Diagnosis present

## 2013-08-13 DIAGNOSIS — R112 Nausea with vomiting, unspecified: Secondary | ICD-10-CM

## 2013-08-13 LAB — RAPID URINE DRUG SCREEN, HOSP PERFORMED
Opiates: NOT DETECTED
Tetrahydrocannabinol: NOT DETECTED

## 2013-08-13 MED ORDER — LORAZEPAM 1 MG PO TABS
1.0000 mg | ORAL_TABLET | Freq: Once | ORAL | Status: AC
Start: 2013-08-13 — End: 2013-08-13
  Administered 2013-08-13: 1 mg via ORAL
  Filled 2013-08-13: qty 1

## 2013-08-13 MED ORDER — IBUPROFEN 400 MG PO TABS
600.0000 mg | ORAL_TABLET | Freq: Once | ORAL | Status: AC
  Administered 2013-08-13: 600 mg via ORAL
  Filled 2013-08-13 (×2): qty 1

## 2013-08-13 MED ORDER — QUETIAPINE FUMARATE 100 MG PO TABS
100.0000 mg | ORAL_TABLET | Freq: Every day | ORAL | Status: DC
  Administered 2013-08-13: 100 mg via ORAL
  Filled 2013-08-13 (×4): qty 1

## 2013-08-13 MED ORDER — ACETAMINOPHEN 500 MG PO TABS: 1000.0000 mg | ORAL_TABLET | Freq: Four times a day (QID) | ORAL | Status: DC | PRN

## 2013-08-13 MED ORDER — TRAZODONE HCL 50 MG PO TABS
50.0000 mg | ORAL_TABLET | Freq: Every day | ORAL | Status: DC
  Administered 2013-08-13 – 2013-08-21 (×9): 50 mg via ORAL
  Filled 2013-08-13 (×5): qty 1
  Filled 2013-08-13: qty 14
  Filled 2013-08-13 (×6): qty 1

## 2013-08-13 MED ORDER — DIPHENHYDRAMINE HCL 25 MG PO TABS
25.0000 mg | ORAL_TABLET | Freq: Four times a day (QID) | ORAL | Status: DC | PRN
  Filled 2013-08-13: qty 1

## 2013-08-13 MED ORDER — DIVALPROEX SODIUM ER 500 MG PO TB24
500.0000 mg | ORAL_TABLET | Freq: Every day | ORAL | Status: DC
  Administered 2013-08-13 – 2013-08-14 (×2): 500 mg via ORAL
  Filled 2013-08-13 (×5): qty 1

## 2013-08-13 MED ORDER — ACETAMINOPHEN 325 MG PO TABS
650.0000 mg | ORAL_TABLET | Freq: Four times a day (QID) | ORAL | Status: DC | PRN
  Administered 2013-08-13 – 2013-08-17 (×4): 650 mg via ORAL

## 2013-08-13 MED ORDER — POLYETHYLENE GLYCOL 3350 17 G PO PACK: 17.0000 g | PACK | Freq: Every day | ORAL | Status: DC

## 2013-08-13 MED ORDER — HYDROXYZINE HCL 25 MG PO TABS
25.0000 mg | ORAL_TABLET | Freq: Three times a day (TID) | ORAL | Status: DC | PRN
  Administered 2013-08-13 – 2013-08-22 (×9): 25 mg via ORAL
  Filled 2013-08-13: qty 1

## 2013-08-13 MED ORDER — ONDANSETRON 4 MG PO TBDP
4.0000 mg | ORAL_TABLET | Freq: Once | ORAL | Status: AC
  Administered 2013-08-13: 4 mg via ORAL
  Filled 2013-08-13: qty 1

## 2013-08-13 MED ORDER — ALUM & MAG HYDROXIDE-SIMETH 200-200-20 MG/5ML PO SUSP
30.0000 mL | ORAL | Status: DC | PRN
  Administered 2013-08-20 – 2013-08-21 (×2): 30 mL via ORAL

## 2013-08-13 MED ORDER — MAGNESIUM HYDROXIDE 400 MG/5ML PO SUSP: 30.0000 mL | Freq: Every day | ORAL | Status: DC | PRN

## 2013-08-13 MED ORDER — ACYCLOVIR 200 MG PO CAPS
200.0000 mg | ORAL_CAPSULE | Freq: Every day | ORAL | Status: DC
  Filled 2013-08-13 (×5): qty 1

## 2013-08-13 MED ORDER — NICOTINE 14 MG/24HR TD PT24
14.0000 mg | MEDICATED_PATCH | Freq: Every day | TRANSDERMAL | Status: DC
  Administered 2013-08-13 – 2013-08-19 (×6): 14 mg via TRANSDERMAL
  Filled 2013-08-13 (×14): qty 1

## 2013-08-13 MED ORDER — DILTIAZEM HCL ER COATED BEADS 180 MG PO CP24
180.0000 mg | ORAL_CAPSULE | Freq: Every day | ORAL | Status: DC
  Filled 2013-08-13: qty 1
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
## 2013-08-13 NOTE — Tx Team (Signed)
Initial Interdisciplinary Treatment Plan  PATIENT STRENGTHS: (choose at least two) Ability for insight Average or above average intelligence Communication skills General fund of knowledge Motivation for treatment/growth Physical Health Supportive family/friends  PATIENT STRESSORS: Educational concerns Financial difficulties Medication change or noncompliance Occupational concerns Substance abuse   PROBLEM LIST: Problem List/Patient Goals Date to be addressed Date deferred Reason deferred Estimated date of resolution  Suicidal ideation 08/13/2013   D/c        Substance abuse 08/13/2013   D/c        Anxiety 08/13/2013   D/c                           DISCHARGE CRITERIA:  Ability to meet basic life and health needs Adequate post-discharge living arrangements Improved stabilization in mood, thinking, and/or behavior Medical problems require only outpatient monitoring Motivation to continue treatment in a less acute level of care Need for constant or close observation no longer present Reduction of life-threatening or endangering symptoms to within safe limits Safe-care adequate arrangements made Verbal commitment to aftercare and medication compliance Withdrawal symptoms are absent or subacute and managed without 24-hour nursing intervention  PRELIMINARY DISCHARGE PLAN: Attend aftercare/continuing care group Attend PHP/IOP Attend 12-step recovery group Outpatient therapy Return to previous living arrangement Return to previous work or school arrangements  PATIENT/FAMIILY INVOLVEMENT: This treatment plan has been presented to and reviewed with the patient, [name].  The patient and family have been given the opportunity to ask questions and make suggestions.  [name] 08/13/2013, 6:07 PM

## 2013-08-13 NOTE — ED Provider Notes (Signed)
11:52 AM Patient with one episode of vomiting and recurrent nausea. Is also c/o generalized abdominal pain. States she tried to go to the bathroom for bowel movement was unable to go. She's not had a bowel movement several days. At the bedside she appears well, is eating without difficulty. Patient has no focal tenderness on my exam. Will obtain x-ray and recommend MiraLax for constipation. Based on her exam there is low concern for a surgical process  1:30 PM alerted the patient has a bed at [facility]. Accepting physician is Dr. Corinda Gubler. Will transfer to their facility and recommend MiraLax for constipation.  Audree Camel, MD 08/13/13 1339
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
## 2013-08-13 NOTE — BH Assessment (Addendum)
Per Tanna Savoy, pt has been accepted to [facility] 161-0, Dr Lucianne Muss to Dr. Elsie Saas. Notified Becky RN and Kriste Basque will notify EDP. Rn will have pt sign paperwork and fax back to TTS at [phone].  Evette Cristal, [place] Assessment Counselor

## 2013-08-13 NOTE — Progress Notes (Signed)
D: Patient in her room on approach.  Patient states she is currently dealing with depression.  Patient states she was having crying spells and being isolative at home.  Patient states she has a high level of anxiety.  Patient states she is a loud person because that is her nature but people do not understand it.  Patient states, "I am  Hyper and I need to control it."  Patient states I need to learn how not to not be so hyper.  Patient denies SI/HI and denies AVH. A: Staff to monitor Q 15 mins for safety.  Encouragement and support offered.  Scheduled medications administered per orders. R: Patient remains safe on the unit.  Patient attended group tonight.  Patient visible on the unit and interacting with peers.  Patient cooperative and taking administered medications.

## 2013-08-13 NOTE — ED Notes (Signed)
Spoke with Berna Spare at [facility], and had the pt put on the list of the day's Telepsychs. No appointment time given.

## 2013-08-13 NOTE — ED Notes (Signed)
Spoke with brad in security. He advises can transport at [phone]

## 2013-08-13 NOTE — ED Notes (Signed)
Pt found to be tearful and requesting medication for anxiety and/or a sleep aide - Dr. Lavella Lemons made aware VO given for 1mg  Ativan PO.
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
## 2013-08-14 MED ORDER — QUETIAPINE FUMARATE 50 MG PO TABS
50.0000 mg | ORAL_TABLET | Freq: Every day | ORAL | Status: DC
  Administered 2013-08-14: 50 mg via ORAL
  Filled 2013-08-14 (×4): qty 1

## 2013-08-14 NOTE — Progress Notes (Signed)
D: Patient in the dayroom on approach.  Writer spoke with the patient in the hallway and patient states she is upset because her mother has not tried to contact her or has she asked about her.  Patient refuses to call her mother on the phone.  Patient states she is tired of going out of her way for people and they don't care about her.  Patient states her boyfriend is her only support system.  Patient denies SI/HI and denies AVH A: Staff to monitor Q 15 mins for safety.  Encouragement and support offered.  Scheduled medications administered per orders. R: Patient remains safe on the unit.  Patient attended group tonight.  Patient visible on the unit and interacting with peers.  Patient cooperative and taking administered medications.

## 2013-08-14 NOTE — Tx Team (Signed)
Interdisciplinary Treatment Plan Update   Date Reviewed:  08/14/2013  Time Reviewed:  9:36 AM  Progress in Treatment:   Attending groups: Yes Participating in groups: Yes Taking medication as prescribed: Yes  Tolerating medication: Yes Family/Significant other contact made: No, but will ask patient for consent for collateral contact Patient understands diagnosis: Yes  Discussing patient identified problems/goals with staff: Yes Medical problems stabilized or resolved: Yes Denies suicidal/homicidal ideation: Yes Patient has not harmed self or others: Yes  For review of initial/current patient goals, please see plan of care.  Estimated Length of Stay:  2-4 days  Reasons for Continued Hospitalization:  Anxiety Depression Medication stabilization   New Problems/Goals identified:    Discharge Plan or Barriers:   Home with outpatient follow up to be scheduled with [facility]  Additional Comments:  [name] is an 32 y.o. female. Pt presents voluntarily to [facility] with chief complaint of SOB. After arrival, pt asks her RN re: symptoms of depression and expressed SI. At time of assessment, pt is tearful and anxious. She is cooperative and soft spoken. Pt endorses insomnia, hopelessness, isolating, loss of interest in usual pleasures, worthlessness, irritability. Pt says she thinks about suicide often including cutting her wrists or overdosing.    Attendees:  Patient:  08/14/2013 9:36 AM   Signature: Mervyn Gay, MD 08/14/2013 9:36 AM  Signature:  Verne Spurr, PA 08/14/2013 9:36 AM  Signature: Harold Barban, RN 08/14/2013 9:36 AM  Signature: 08/14/2013 9:36 AM  Signature:  08/14/2013 9:36 AM  Signature:  Juline Patch, LCSW 08/14/2013 9:36 AM  Signature:  Reyes Ivan, LCSW 08/14/2013 9:36 AM  Signature:  Sharin Grave Coordinator 08/14/2013 9:36 AM  Signature:  08/14/2013 9:36 AM  Signature: Leighton Parody, RN 08/14/2013  9:36 AM  Signature:     Signature:      Scribe for Treatment Team:   Juline Patch,  08/14/2013 9:36 AM

## 2013-08-14 NOTE — Progress Notes (Signed)
Adult Psychoeducational Group Note  Date:  08/14/2013 Time: 10:00am Group Topic/Focus:  Therapeutic Activity  Participation Level:  Active  Participation Quality:  Appropriate and Attentive  Affect:  Appropriate  Cognitive:  Alert and Appropriate  Insight: Appropriate  Engagement in Group:  Engaged  Modes of Intervention:  Discussion and Education  Additional Comments:  Pt attended and participated in group. Pt was asked  The question if she had three wishes what would it be? Pt stated God in her life,closer relationship with her family, have a baby. Shelly Bombard D 08/14/2013, 2:02 PM
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
## 2013-08-14 NOTE — [facility] Group Notes (Signed)
[facility] LCSW Aftercare Discharge Planning Group Note   08/14/2013 9:33 AM  Participation Quality:  Appropriate  Mood/Affect:  Depressed and Flat  Depression Rating:  8  Anxiety Rating:  8  Thoughts of Suicide:  No  Will you contract for safety?   NA  Current AVH:  NA  Plan for Discharge/Comments:  Patient attending discharge planning group and actively participated in group.  Patient reports she lives with her mother but they have problems.  She advised nof not having outpatient providers but open for referral for services.  CSW provided all participants with daily workbook and information on services offered by [facility].   Transportation Means: Patient uses public transportation.   Supports:  Patient has limited support system.   Juwon Scripter, [place] July

## 2013-08-14 NOTE — Progress Notes (Signed)
Adult Psychoeducational Group Note  Date:  08/13/13 Time:  8:00 pm  Group Topic/Focus:  Wrap-Up Group:   The focus of this group is to help patients review their daily goal of treatment and discuss progress on daily workbooks.  Participation Level:  Active  Participation Quality:  Appropriate and Sharing  Affect:  Appropriate  Cognitive:  Appropriate  Insight: Appropriate  Engagement in Group:  Engaged  Modes of Intervention:  Discussion, Education, Socialization and Support  Additional Comments:  Pt stated that she has been hospitalized due to her anxiety and depression. Pt stated that she has been having suicidal ideations. Pt stated that she is thankful for her treatment.   Harris Penton 08/14/2013, 1:28 AM

## 2013-08-14 NOTE — Progress Notes (Signed)
D: Patient appropriate and cooperative with staff and peers. Patient's affect/mood is anxious. She reported on the self inventory sheet that her sleep is fair, appetite is improving, energy level is low and ability to pay attention is improving. Patient rated depression "8" and feelings of hopelessness "10". She's participating in groups and compliant with current medication regimen.  A: Support and encouragement provided to patient. Administered scheduled medications per ordering MD. Monitor Q15 minute checks for safety.  R: Patient receptive. Passive SI, but contracts for safety. Denies HI and AVH. Patient remains safe on the unit.

## 2013-08-14 NOTE — Progress Notes (Signed)
The focus of this group is to help patients review their daily goal of treatment and discuss progress on daily workbooks. Pt attended the evening group session and responded to all discussion prompts from the Writer. Pt reported having a good day on the unit, one positive thing from which was that she laughed and joked a lot with her peers on the hallway. Pt shared that her goals and ambitions for the future were high and included getting a good job, getting married and having children. Pt's affect was bright.

## 2013-08-14 NOTE — [facility] Group Notes (Addendum)
[facility] LCSW Group Therapy  Emotional Regulation 1:15 - 2: 30 PM        08/14/2013  2:56 PM   Type of Therapy:  Group Therapy  Participation Level:  Appropriate  Participation Quality:  Appropriate  Affect:  Appropriate  Cognitive:  Attentive Appropriate  Insight:  Developing/Improving  Engagement in Therapy: Developing/Improving  Modes of Intervention:  Discussion Exploration Problem-Solving Supportive  Summary of Progress/Problems:  Group topic was emotional regulations.  Patient listened attentively but did not participate in discussion.  Wynn Banker 08/14/2013 2:56 PM

## 2013-08-14 NOTE — Progress Notes (Signed)
Recreation Therapy Notes  Date: 09.10.2014 Time: 3:00pm Location: 500 Hall Dayroom  Group Topic: Self Expression  Goal Area(s) Addresses:  Patient will will use art as a means of self-expression. Patient will identify effectively identify emotions experienced during activity.   Behavioral Response: Engaged, Appropriate  Intervention: Art  Activity: Patients were asked to draw a bottle that would represent their lives, inside the bottle patients were asked to draw or write words to represent how they currently feel about life. Group discussion focused around using currently feelings to effectuate change in their lives.   Education: Pharmacologist, Discharge Planning, Emotional Exploration   Education Outcome: Acknowledges understanding   Clinical Observations/Feedback:  Patient contributed to opening discussion, describing personal development as "improving ones self." Patient participated in activity. At approximately 3:15pm patient was asked to leave group session by MD, patient did not return to group session.   Marykay Lex Roxine Whittinghill, LRT/CTRS  Jearl Klinefelter 08/14/2013 9:16 PM
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
## 2013-08-15 MED ORDER — DIVALPROEX SODIUM ER 500 MG PO TB24
750.0000 mg | ORAL_TABLET | Freq: Every day | ORAL | Status: DC
  Administered 2013-08-15 – 2013-08-21 (×7): 750 mg via ORAL
  Filled 2013-08-15 (×8): qty 1

## 2013-08-15 MED ORDER — QUETIAPINE FUMARATE 100 MG PO TABS
100.0000 mg | ORAL_TABLET | Freq: Every day | ORAL | Status: DC
  Administered 2013-08-15 – 2013-08-18 (×4): 100 mg via ORAL
  Filled 2013-08-15 (×7): qty 1

## 2013-08-15 NOTE — [facility] Group Notes (Signed)
[facility] LCSW Group Therapy  [facility]  1:15 - 3: 30          08/15/2013  3:20 PM     Type of Therapy:  Group Therapy  Participation Level:  Patient was meeting with MD.  [name] 08/15/2013 3:20 PM

## 2013-08-15 NOTE — B[facility] Suicide Risk Assessment (Signed)
B[facility] INPATIENT:  Family/Significant Other Suicide Prevention Education  Suicide Prevention Education:  Education Completed; Chaney Malling, Boyfriend, [phone]; has been identified by the patient as the family member/significant other with whom the patient will be residing, and identified as the person(s) who will aid the patient in the event of a mental health crisis (suicidal ideations/suicide attempt).  With written consent from the patient, the family member/significant other has been provided the following suicide prevention education, prior to the and/or following the discharge of the patient.  The suicide prevention education provided includes the following:  Suicide risk factors  Suicide prevention and interventions  National Suicide Hotline telephone number  [facility] assessment telephone number  [facility] Emergency Assistance 911  [facility] and/or Residential Mobile Crisis Unit telephone number  Request made of family/significant other to:  Remove weapons (e.g., guns, rifles, knives), all items previously/currently identified as safety concern.  Boyfriend does not know of patient having access to guns.  Remove drugs/medications (over-the-counter, prescriptions, illicit drugs), all items previously/currently identified as a safety concern.  The family member/significant other verbalizes understanding of the suicide prevention education information provided.  The family member/significant other agrees to remove the items of safety concern listed above.  [name] 08/15/2013, 3:58 PM

## 2013-08-15 NOTE — Progress Notes (Signed)
Adult Psychoeducational Group Note  Date:  08/15/2013 Time:  10:00am  Group Topic/Focus:  Therapeutic activity  Participation Level:  Active  Participation Quality:  Appropriate and Attentive  Affect:  Appropriate  Cognitive:  Alert and Appropriate  Insight: Appropriate  Engagement in Group:  Engaged  Modes of Intervention:  Discussion and Education  Additional Comments:  Pt attended and participated in group. Pt was asked of a good memory and pt stated time spent with father she misses that.  Shelly Bombard D 08/15/2013, 1:10 PM

## 2013-08-15 NOTE — Progress Notes (Signed)
D   Pt is pleasant on approach  She interacts well with others   She attends and participates in group   She reports increased anxiety and agitation   She questioned her increased doses of medications and was somewhat reluctant to take them until it was explained her doctor increased the dosage   She denies suicidal and homicidal ideation A   Verbal support given  Medications administered and effectiveness monitored    Q 15 min checks R   Pt safe at present

## 2013-08-15 NOTE — Progress Notes (Signed)
Adult Psychoeducational Group Note  Date:  08/15/2013 Time:  2:56 PM  Group Topic/Focus:  Overcoming Stress:   The focus of this group is to define stress and help patients assess their triggers.  Participation Level:  Minimal  Participation Quality:  Appropriate  Affect:  Flat and Lethargic  Cognitive:  Appropriate  Insight: None  Engagement in Group:  None  Modes of Intervention:  Education  Additional Comments:  Pt presented to day room for group, but left shortly after group began. Pt offered no contributions to group discussion while she was present at group.  Reinaldo Raddle K 08/15/2013, 2:56 PM

## 2013-08-15 NOTE — Progress Notes (Signed)
Adult Psychoeducational Group Note  Date:  08/15/2013 Time:  9:00 AM  Group Topic/Focus:  Dimensions of Wellness:   The focus of this group is to introduce the topic of wellness and discuss the role each dimension of wellness plays in total health.  Participation Level:  Active  Participation Quality:  Appropriate and Attentive  Affect:  Anxious  Cognitive:  Alert and Oriented  Insight: Appropriate  Engagement in Group:  Engaged  Modes of Intervention:  Discussion  Additional Comments:  Pt. participated in warm-up exercises.  Harold Barban E 08/15/2013, 10:04 AM

## 2013-08-15 NOTE — Progress Notes (Signed)
D: Patient's affect/mood is anxious. Patient complained of anxiety. She reported on the self inventory sheet that her sleep is poor, appetite is good, energy level is low and ability to pay attention is poor. Patient rated depression and feelings of hopelessness "8". She's interacting with peers in the milieu and attending groups throughout the day.  A: Support and encouragement provided to patient. Scheduled medications administered per MD orders. Maintain Q15 minute checks for safety.  R: Patient receptive. She reported on the inventory that she's been passive SI within the last 24 hours, but when asked this morning at the medication window about having suicidal thoughts, pt. denied SI thoughts, as well as HI and AVH. Patient remains safe.

## 2013-08-15 NOTE — Progress Notes (Signed)
[facility] MD Progress Note  08/15/2013 3:17 PM [name]  MRN:  [id number] Subjective:  Patient complains not feeling well. She continuesto have mood swings,  depression, anxiety and suicidal thoughts without specific plan . Patient feels hopeless and helpless. Patient has  all right  appetite. Patient reported her boyfriend came yesterday to meet with her and she has a  plan  of going to his home after hospitalization. Patient has been compliant with her medication and has no reported adverse effects. Patient has been trying to participate in unit program including groups and  Lenning some coping skills.   Diagnosis:   DSM5: Schizophrenia Disorders:   Obsessive-Compulsive Disorders:   Trauma-Stressor Disorders:   Substance/Addictive Disorders:   Depressive Disorders:    Axis I: Bipolar, mixed  ADL's:  Intact  Sleep: Poor  Appetite:  Fair  Suicidal Ideation:  Patient endorses suicidal ideation without intention or plan at this time. Patient contracts for safety  Homicidal Ideation:  Denied AEB (as evidenced by):  Psychiatric Specialty Exam: ROS  Blood pressure 123/85, pulse 94, temperature 97.6 F (36.4 C), temperature source Oral, resp. rate 24, height 5' (1.524 m), weight 53.978 kg (119 lb), last menstrual period 08/10/2013, SpO2 100.00%.Body mass index is 23.24 kg/(m^2).  General Appearance: Disheveled and Guarded  Eye Solicitor::  Fair  Speech:  Clear and Coherent  Volume:  Normal  Mood:  Angry, Anxious, Depressed, Hopeless, Irritable and Worthless  Affect:  Constricted and Depressed  Thought Process:  Coherent and Goal Directed  Orientation:  Full (Time, Place, and Person)  Thought Content:  Obsessions and Rumination  Suicidal Thoughts:  Yes.  without intent/plan  Homicidal Thoughts:  No  Memory:  Immediate;   Fair  Judgement:  Impaired  Insight:  Lacking  Psychomotor Activity:  Restlessness  Concentration:  Fair  Recall:  Fair  Akathisia:  NA  Handed:  Right   AIMS (if indicated):     Assets:  Communication Skills Desire for Improvement Resilience Social Support Transportation  Sleep:  Number of Hours: 6.75   Current Medications: Current Facility-Administered Medications  Medication Dose Route Frequency Provider Last Rate Last Dose  . acetaminophen (TYLENOL) tablet 650 mg  650 mg Oral Q6H PRN Nelly Rout, MD   650 mg at 08/14/13 1149  . alum & mag hydroxide-simeth (MAALOX/MYLANTA) 200-200-20 MG/5ML suspension 30 mL  30 mL Oral Q4H PRN Nelly Rout, MD      . divalproex (DEPAKOTE ER) 24 hr tablet 750 mg  750 mg Oral QHS Nehemiah Settle, MD      . hydrOXYzine (ATARAX/VISTARIL) tablet 25 mg  25 mg Oral TID PRN Nelly Rout, MD   25 mg at 08/15/13 7829  . magnesium hydroxide (MILK OF MAGNESIA) suspension 30 mL  30 mL Oral Daily PRN Nelly Rout, MD      . nicotine (NICODERM CQ - dosed in mg/24 hours) patch 14 mg  14 mg Transdermal Daily Nehemiah Settle, MD   14 mg at 08/15/13 0755  . QUEtiapine (SEROQUEL) tablet 100 mg  100 mg Oral QHS Nehemiah Settle, MD      . traZODone (DESYREL) tablet 50 mg  50 mg Oral QHS Verne Spurr, PA-C   50 mg at 08/14/13 2105    Lab Results: No results found for this or any previous visit (from the past 48 hour(s)).  Physical Findings: AIMS: Facial and Oral Movements Muscles of Facial Expression: None, normal Lips and Perioral Area: None, normal Jaw: None, normal,Extremity Movements Upper (  arms, wrists, hands, fingers): None, normal Lower (legs, knees, ankles, toes): None, normal, Trunk Movements Neck, shoulders, hips: None, normal, Overall Severity Severity of abnormal movements (highest score from questions above): None, normal Incapacitation due to abnormal movements: None, normal Patient's awareness of abnormal movements (rate only patient's report): No Awareness, Dental Status Current problems with teeth and/or dentures?: No Does patient usually wear dentures?: No  CIWA:   CIWA-Ar Total: 4 COWS:  COWS Total Score: 2  Treatment Plan Summary: Daily contact with patient to assess and evaluate symptoms and progress in treatment Medication management  Plan: Increase Depakote 750 mg at bedtime  Increase Seroquel 100 mg at bedtime for mood swings Monitor for adverse effect of the medications Treatment Plan/Recommendations:  1. Admit for crisis management and stabilization. 2. Medication management to reduce current symptoms to base line and improve the patient's overall level of functioning. 3. Treat health problems as indicated. 4. Develop treatment plan to decrease risk of relapse upon discharge and to reduce the need for readmission. 5. Psycho-social education regarding relapse prevention and self care. 6. Health care follow up as needed for medical problems. 7. disposition plans are in progress  Medical Decision Making Problem Points:  Established problem, worsening (2), Review of last therapy session (1) and Review of psycho-social stressors (1) Data Points:  Review or order clinical lab tests (1) Review or order medicine tests (1) Review of medication regiment & side effects (2) Review of new medications or change in dosage (2)  I certify that inpatient services furnished can reasonably be expected to improve the patient's condition.   [name],Breanna R. 08/15/2013, 3:17 PM

## 2013-08-16 MED ORDER — LORAZEPAM 2 MG/ML IJ SOLN
INTRAMUSCULAR | Status: AC
  Filled 2013-08-16: qty 1

## 2013-08-16 MED ORDER — CLONAZEPAM 0.5 MG PO TABS
0.5000 mg | ORAL_TABLET | Freq: Two times a day (BID) | ORAL | Status: DC
  Administered 2013-08-16 – 2013-08-18 (×4): 0.5 mg via ORAL
  Filled 2013-08-16 (×4): qty 1

## 2013-08-16 MED ORDER — LORAZEPAM 2 MG/ML IJ SOLN
1.0000 mg | Freq: Once | INTRAMUSCULAR | Status: AC
  Administered 2013-08-16: 1 mg via INTRAMUSCULAR

## 2013-08-16 MED ORDER — LORAZEPAM 2 MG/ML IJ SOLN: 1.0000 mg | Freq: Once | INTRAMUSCULAR | Status: DC

## 2013-08-16 NOTE — Progress Notes (Signed)
D: Patient's affect/mood is anxious. Earlier patient was tearful; she verbalized that the groups here are not helping her because she listens to what others have to say and is saddened by the stories. Patient repeatedly called herself a cry baby and is furious about being overly sensitive about everything. She reported on the self inventory sheet that she slept well, appetite is good, energy level is low and ability to pay attention is poor. Patient rated depression and anxiety "10". She did not attend groups, but patient is compliant with medications.  A: Support and encouragement provided to patient. Administered scheduled medications per ordering MD. Monitor Q15 minute checks for safety.  R: Patient receptive. Passive SI, but contracts for safety. Denies HI and AVH. Patient remains safe on the unit.

## 2013-08-16 NOTE — Progress Notes (Signed)
Patient ID: [name], female   DOB: 1981/06/08, 32 y.o.   MRN: [id number] Pt did not attend group.

## 2013-08-16 NOTE — Progress Notes (Signed)
Patient ID: [name], female   DOB: 06/14/1981, 32 y.o.   MRN: [id number] [facility] MD Progress Note  08/16/2013 12:03 PM [name]  MRN:  [id number]  Subjective:  Patient complains feeling extreme anxiety, shaking and pacing. She she has mood swings,  depression, anxiety and suicidal thoughts without specific plan . Patient stated that she did fine last evening and slept well but this morning she started feeling hopeless and helpless. Patient has been compliant with her medication and has no reported adverse effects.    Diagnosis:   DSM5: Schizophrenia Disorders:   Obsessive-Compulsive Disorders:   Trauma-Stressor Disorders:   Substance/Addictive Disorders:   Depressive Disorders:    Axis I: Bipolar, mixed  ADL's:  Intact  Sleep: Poor  Appetite:  Fair  Suicidal Ideation:  Patient endorses suicidal ideation without intention or plan at this time. Patient contracts for safety  Homicidal Ideation:  Denied AEB (as evidenced by):  Psychiatric Specialty Exam: ROS  Blood pressure 118/85, pulse 102, temperature 97 F (36.1 C), temperature source Oral, resp. rate 16, height 5' (1.524 m), weight 53.978 kg (119 lb), last menstrual period 08/10/2013, SpO2 100.00%.Body mass index is 23.24 kg/(m^2).  General Appearance: Disheveled and Guarded  Eye Solicitor::  Fair  Speech:  Clear and Coherent  Volume:  Normal  Mood:  Angry, Anxious, Depressed, Hopeless, Irritable and Worthless  Affect:  Constricted and Depressed  Thought Process:  Coherent and Goal Directed  Orientation:  Full (Time, Place, and Person)  Thought Content:  Obsessions and Rumination  Suicidal Thoughts:  Yes.  without intent/plan  Homicidal Thoughts:  No  Memory:  Immediate;   Fair  Judgement:  Impaired  Insight:  Lacking  Psychomotor Activity:  Restlessness  Concentration:  Fair  Recall:  Fair  Akathisia:  NA  Handed:  Right  AIMS (if indicated):     Assets:  Communication Skills Desire for  Improvement Resilience Social Support Transportation  Sleep:  Number of Hours: 6.75   Current Medications: Current Facility-Administered Medications  Medication Dose Route Frequency Provider Last Rate Last Dose  . acetaminophen (TYLENOL) tablet 650 mg  650 mg Oral Q6H PRN Nelly Rout, MD   650 mg at 08/14/13 1149  . alum & mag hydroxide-simeth (MAALOX/MYLANTA) 200-200-20 MG/5ML suspension 30 mL  30 mL Oral Q4H PRN Nelly Rout, MD      . divalproex (DEPAKOTE ER) 24 hr tablet 750 mg  750 mg Oral QHS Nehemiah Settle, MD   750 mg at 08/15/13 2155  . hydrOXYzine (ATARAX/VISTARIL) tablet 25 mg  25 mg Oral TID PRN Nelly Rout, MD   25 mg at 08/16/13 1117  . magnesium hydroxide (MILK OF MAGNESIA) suspension 30 mL  30 mL Oral Daily PRN Nelly Rout, MD      . nicotine (NICODERM CQ - dosed in mg/24 hours) patch 14 mg  14 mg Transdermal Daily Nehemiah Settle, MD   14 mg at 08/15/13 0755  . QUEtiapine (SEROQUEL) tablet 100 mg  100 mg Oral QHS Nehemiah Settle, MD   100 mg at 08/15/13 2155  . traZODone (DESYREL) tablet 50 mg  50 mg Oral QHS Verne Spurr, PA-C   50 mg at 08/15/13 2155    Lab Results: No results found for this or any previous visit (from the past 48 hour(s)).  Physical Findings: AIMS: Facial and Oral Movements Muscles of Facial Expression: None, normal Lips and Perioral Area: None, normal Jaw: None, normal,Extremity Movements Upper (arms, wrists, hands, fingers): None, normal  Lower (legs, knees, ankles, toes): None, normal, Trunk Movements Neck, shoulders, hips: None, normal, Overall Severity Severity of abnormal movements (highest score from questions above): None, normal Incapacitation due to abnormal movements: None, normal Patient's awareness of abnormal movements (rate only patient's report): No Awareness, Dental Status Current problems with teeth and/or dentures?: No Does patient usually wear dentures?: No  CIWA:  CIWA-Ar Total: 4 COWS:   COWS Total Score: 2  Treatment Plan Summary: Daily contact with patient to assess and evaluate symptoms and progress in treatment Medication management  Plan: Ativan 1 mg IM Stat x once Start Klonopin 0.5 mg twice daily Continue Depakote 750 mg at bedtime  Continue Seroquel 100 mg at bedtime for mood swings Monitor for adverse effect of the medications Treatment Plan/Recommendations:  1. Admit for crisis management and stabilization. 2. Medication management to reduce current symptoms to base line and improve the patient's overall level of functioning. 3. Treat health problems as indicated. 4. Develop treatment plan to decrease risk of relapse upon discharge and to reduce the need for readmission. 5. Psycho-social education regarding relapse prevention and self care. 6. Health care follow up as needed for medical problems. 7. disposition plans are in progress and length of stay 3 days  Medical Decision Making Problem Points:  Established problem, worsening (2), Review of last therapy session (1) and Review of psycho-social stressors (1) Data Points:  Review or order clinical lab tests (1) Review or order medicine tests (1) Review of medication regiment & side effects (2) Review of new medications or change in dosage (2)  I certify that inpatient services furnished can reasonably be expected to improve the patient's condition.   Shalina Norfolk,JANARDHAHA R. 08/16/2013, 12:03 PM

## 2013-08-16 NOTE — BHH Group Notes (Signed)
BHH LCSW Group Therapy  Feelings Around Relapse 1:15 -2:30        08/16/2013  2:46 PM   Type of Therapy:  Group Therapy  Participation Level:  Did not attend group.  Wynn Banker 08/16/2013  2:46 PM

## 2013-08-16 NOTE — BHH Group Notes (Signed)
[facility] LCSW Aftercare Discharge Planning Group Note   08/16/2013 11:32 AM  Participation Quality:  Patient did not attend group.  Toby Ayad, Joesph July

## 2013-08-16 NOTE — Tx Team (Signed)
Interdisciplinary Treatment Plan Update   Date Reviewed:  08/16/2013  Time Reviewed:  10:00 AM  Progress in Treatment:   Attending groups: Yes Participating in groups: Yes Taking medication as prescribed: Yes  Tolerating medication: Yes Family/Significant other contact made:Yes, collateral contact made with boyfriend. Patient understands diagnosis: Yes  Discussing patient identified problems/goals with staff: Yes Medical problems stabilized or resolved: Yes Denies suicidal/homicidal ideation: Yes Patient has not harmed self or others: Yes  For review of initial/current patient goals, please see plan of care.  Estimated Length of Stay:  2-4 days  Reasons for Continued Hospitalization:  Anxiety Depression Medication stabilization   New Problems/Goals identified:    Discharge Plan or Barriers:   Home with outpatient follow up to be scheduled [facility]  Additional Comments:  [name] is an 32 y.o. female. Pt presents voluntarily to [facility] with chief complaint of SOB. After arrival, pt asks her RN re: symptoms of depression and expressed SI. At time of assessment, pt is tearful and anxious. She is cooperative and soft spoken. Pt endorses insomnia, hopelessness, isolating, loss of interest in usual pleasures, worthlessness, irritability. Pt says she thinks about suicide often including cutting her wrists or overdosing.    Attendees:  Patient:  08/16/2013 10:00 AM   Signature: Mervyn Gay, MD 08/16/2013 10:00 AM  Signature:  Verne Spurr, PA 08/16/2013 10:00 AM  Signature: Harold Barban, RN 08/16/2013 10:00 AM  Signature: 08/16/2013 10:00 AM  Signature:  08/16/2013 10:00 AM  Signature:  Horace Porteous Tangela Dolliver, LCSW 08/16/2013 10:00 AM  Signature:  Reyes Ivan, LCSW 08/16/2013 10:00 AM  Signature:  Maseta Dorley,Care Coordinator 08/16/2013 10:00 AM  Signature:  08/16/2013 10:00 AM  Signature: Leighton Parody, RN 08/16/2013  10:00 AM  Signature:     Signature:      Scribe for Treatment Team:   [employer],  08/16/2013 10:00 AM

## 2013-08-16 NOTE — Progress Notes (Signed)
D: Patient mood is depressed but she brightens up considerably upon approach. She stated that she regrets getting so upset earlier when she was requesting discharge. She went to group tonight and has been interacting well within the milieu.  A: Support given. Verbalization encouraged. Pt encouraged to come to staff with any concerns.  R: Pt is receptive. Medications given as prescribed. Q15 min safety checks maintained. Will continue to monitor pt.

## 2013-08-17 MED ORDER — IBUPROFEN 400 MG PO TABS
400.0000 mg | ORAL_TABLET | Freq: Four times a day (QID) | ORAL | Status: DC
  Administered 2013-08-18 (×3): 400 mg via ORAL
  Filled 2013-08-17 (×10): qty 1

## 2013-08-17 MED ORDER — IBUPROFEN 200 MG PO TABS
ORAL_TABLET | ORAL | Status: AC
  Administered 2013-08-17: 400 mg
  Filled 2013-08-17: qty 2

## 2013-08-17 MED ORDER — QUETIAPINE FUMARATE 50 MG PO TABS
50.0000 mg | ORAL_TABLET | Freq: Two times a day (BID) | ORAL | Status: DC | PRN
  Administered 2013-08-17: 50 mg via ORAL

## 2013-08-17 NOTE — Progress Notes (Addendum)
Patient ID: [name], female   DOB: July 24, 1981, 32 y.o.   MRN: [id number]  [facility] MD Progress Note  08/17/2013 3:45 PM [name]  MRN:  [id number]  Subjective:  Patient states "I feel upset, depressed, anxious, and frustrated. I have no support and I feel lonely. I can't get a job and just bounce around from place to place. Sometimes I'm just on the streets. I can't get anything done because I sleep all the time and don't have a job. My family is all tired of me and so am I. I just wish I was not in this world anymore. I'm depressed at a ten. I wish I could hang myself or cut my wrist."  Objective:  Patient observed sitting in the hall with angry affect and shaking her legs. She appears very angry when discussing her life situation and feels neglected by everyone around her. The patient does not appear to take any responsibility for being unemployment and homeless.   Diagnosis:   DSM5: Schizophrenia Disorders:   Obsessive-Compulsive Disorders:   Trauma-Stressor Disorders:   Substance/Addictive Disorders:   Depressive Disorders:    Axis I: Bipolar, mixed  ADL's:  Intact  Sleep: Poor  Appetite:  Fair  Suicidal Ideation:  Patient endorses suicidal ideation with plan at this time. Patient contracts for safety at this time.  Homicidal Ideation:  Denied AEB (as evidenced by):  Psychiatric Specialty Exam: Review of Systems  Constitutional: Negative.   HENT: Negative.   Eyes: Negative.   Respiratory: Negative.   Cardiovascular: Negative.   Gastrointestinal: Negative.   Genitourinary: Negative.   Musculoskeletal: Negative.   Skin: Negative.   Neurological: Negative.   Endo/Heme/Allergies: Negative.   Psychiatric/Behavioral: Positive for depression and suicidal ideas. The patient is nervous/anxious.     Blood pressure 104/72, pulse 85, temperature 97.5 F (36.4 C), temperature source Oral, resp. rate 18, height 5' (1.524 m), weight 53.978 kg (119 lb), last  menstrual period 08/10/2013, SpO2 100.00%.Body mass index is 23.24 kg/(m^2).  General Appearance: Disheveled and Guarded  Eye Solicitor::  Fair  Speech:  Clear and Coherent  Volume:  Normal  Mood:  Angry, Anxious, Depressed, Hopeless, Irritable and Worthless  Affect:  Constricted and Depressed  Thought Process:  Coherent and Goal Directed  Orientation:  Full (Time, Place, and Person)  Thought Content:  Obsessions and Rumination  Suicidal Thoughts:  Yes, is able to contract for safety  Homicidal Thoughts:  No  Memory:  Immediate;   Fair  Judgement:  Impaired  Insight:  Lacking  Psychomotor Activity:  Restlessness  Concentration:  Fair  Recall:  Fair  Akathisia:  NA  Handed:  Right  AIMS (if indicated):     Assets:  Communication Skills Desire for Improvement Resilience Social Support Transportation  Sleep:  Number of Hours: 6.75   Current Medications: Current Facility-Administered Medications  Medication Dose Route Frequency Provider Last Rate Last Dose  . acetaminophen (TYLENOL) tablet 650 mg  650 mg Oral Q6H PRN Nelly Rout, MD   650 mg at 08/16/13 2028  . alum & mag hydroxide-simeth (MAALOX/MYLANTA) 200-200-20 MG/5ML suspension 30 mL  30 mL Oral Q4H PRN Nelly Rout, MD      . clonazePAM Scarlette Calico) tablet 0.5 mg  0.5 mg Oral BID Nehemiah Settle, MD   0.5 mg at 08/17/13 0811  . divalproex (DEPAKOTE ER) 24 hr tablet 750 mg  750 mg Oral QHS Nehemiah Settle, MD   750 mg at 08/16/13 2126  . hydrOXYzine (ATARAX/VISTARIL)  tablet 25 mg  25 mg Oral TID PRN Nelly Rout, MD   25 mg at 08/17/13 1515  . magnesium hydroxide (MILK OF MAGNESIA) suspension 30 mL  30 mL Oral Daily PRN Nelly Rout, MD      . nicotine (NICODERM CQ - dosed in mg/24 hours) patch 14 mg  14 mg Transdermal Daily Nehemiah Settle, MD   14 mg at 08/17/13 0811  . QUEtiapine (SEROQUEL) tablet 100 mg  100 mg Oral QHS Nehemiah Settle, MD   100 mg at 08/16/13 2126  . QUEtiapine  (SEROQUEL) tablet 50 mg  50 mg Oral BID PRN Larena Sox, MD      . traZODone (DESYREL) tablet 50 mg  50 mg Oral QHS Verne Spurr, PA-C   50 mg at 08/16/13 2126    Lab Results: No results found for this or any previous visit (from the past 48 hour(s)).  Physical Findings: AIMS: Facial and Oral Movements Muscles of Facial Expression: None, normal Lips and Perioral Area: None, normal Jaw: None, normal,Extremity Movements Upper (arms, wrists, hands, fingers): None, normal Lower (legs, knees, ankles, toes): None, normal, Trunk Movements Neck, shoulders, hips: None, normal, Overall Severity Severity of abnormal movements (highest score from questions above): None, normal Incapacitation due to abnormal movements: None, normal Patient's awareness of abnormal movements (rate only patient's report): No Awareness, Dental Status Current problems with teeth and/or dentures?: No Does patient usually wear dentures?: No  CIWA:  CIWA-Ar Total: 4 COWS:  COWS Total Score: 2  Treatment Plan Summary: Daily contact with patient to assess and evaluate symptoms and progress in treatment Medication management  Plan: 1. Continue crisis management and stabilization. 2. Medication management to reduce current symptoms to base line and improve the patient's overall level of functioning. 3. Treat health problems as indicated. 4. Develop treatment plan to decrease risk of relapse upon discharge and to reduce the need for readmission. 5. Psycho-social education regarding relapse prevention and self care. 6. Health care follow up as needed for medical problems. 7. disposition plans are in progress and length of stay 3 days 8. Continue Klonopin 0.5 mg BID for anxiety. Continue Depakote 750 mg at bedtime for improved mood stability. Continue Seroquel 100 mg at bedtime for improved mood stability. Patient may request vistaril or seroquel as needed for anxiety.   Medical Decision Making Problem Points:   Established problem, worsening (2), Review of last therapy session (1) and Review of psycho-social stressors (1) Data Points:  Review or order clinical lab tests (1) Review or order medicine tests (1) Review of medication regiment & side effects (2) Review of new medications or change in dosage (2)  I certify that inpatient services furnished can reasonably be expected to improve the patient's condition.   DAVIS, LAURA NP-C 08/17/2013, 3:45 PM  Reviewed note, agree with findings and plan.  Jacqulyn Cane, M.D.  08/17/2013 11:33 PM

## 2013-08-17 NOTE — Progress Notes (Signed)
[facility] Group Notes:  (Nursing/MHT/Case Management/Adjunct) Date:  08/16/2013 Time:  2000  Type of Therapy:  Psychoeducational Skills  Participation Level:  Active  Participation Quality:  Appropriate  Affect:  Flat  Cognitive:  Appropriate  Insight:  Appropriate  Engagement in Group:  Developing/Improving  Modes of Intervention:  Education  Summary of Progress/Problems: The patient described her day as having been "rocky". She attributed her day to not being discharged from the hospital. The patient regrets her behavior from earlier in the day and is now calmer. Her goal for tomorrow is to work on herself.   [name] 08/17/2013, 12:19 AM

## 2013-08-17 NOTE — Progress Notes (Signed)
Patient ID: [name], female   DOB: 1981-05-01, 32 y.o.   MRN: [id number] D: Pt is awake and active on the unit this AM. Pt denies SI/HI and A/V hallucinations. Pt rates their depression at 5 and hopelessness at 9. Pt's mood is depressed and her affect is flat/blunted. Pt writes that she hopes to take her medications, find a back specialist and apply for SSI. Pt feels overwhelmed by her stressors but is hoping to improve over time. Writer encouraged pt to follow through with her out-patient care plan.   A: Encouraged pt to discuss feelings with staff and administered medication per MD orders. Writer also encouraged pt to participate in groups.  R: Pt is attending groups and tolerating medications well. Writer will continue to monitor. 15 minute checks are ongoing for safety.

## 2013-08-17 NOTE — Progress Notes (Signed)
Adult Psychoeducational Group Note  Date:  08/17/2013 Time:  1300  Group Topic/Focus:  Making Healthy Choices:   The focus of this group is to help patients identify negative/unhealthy choices they were using prior to admission and identify positive/healthier coping strategies to replace them upon discharge.  Participation Level:  Active  Participation Quality:  Attentive sharing  Affect:  Appropriate  Cognitive:  Appropriate  Insight: Improving  Engagement in Group:  Engaged  Modes of Intervention:  Discussion and Education  Additional Comments:  Pt expressed concerns about family members as stressors.  Barbette Merino, Baldwin Racicot Shari Prows 08/17/2013, 2:37 PM

## 2013-08-17 NOTE — [facility] Group Notes (Signed)
[facility] Group Notes: (Clinical Social Work)   08/17/2013      Type of Therapy:  Group Therapy   Participation Level:  Did Not Attend - Refused to attend.  Toward end of group came to the group room door and asked loudly if she could come in and get her book.  CSW asked her to wait until after group.  She then came into the room and sat until group ended 5 minutes later.   Ambrose Mantle, LCSW 08/17/2013, 5:35 PM

## 2013-08-17 NOTE — Progress Notes (Signed)
Adult Psychoeducational Group Note  Date:  08/17/2013 Time:  0900  Group Topic/Focus:  Self Inventory  Participation Level:  Active  Participation Quality:  Appropriate and Attentive  Affect:  Flat  Cognitive:  Alert and Appropriate  Insight: Improving  Engagement in Group:  Engaged  Modes of Intervention:  Discussion  Additional Comments:  Pt shared insight into topic discussion.   Rual Vermeer Shari Prows 08/17/2013, 9:50 AM

## 2013-08-18 DIAGNOSIS — K047 Periapical abscess without sinus: Secondary | ICD-10-CM

## 2013-08-18 DIAGNOSIS — I10 Essential (primary) hypertension: Secondary | ICD-10-CM

## 2013-08-18 DIAGNOSIS — I4891 Unspecified atrial fibrillation: Secondary | ICD-10-CM

## 2013-08-18 LAB — CBC WITH DIFFERENTIAL/PLATELET
Basophils Absolute: 0.1 10*3/uL (ref 0.0–0.1)
Basophils Relative: 1 % (ref 0–1)
Eosinophils Absolute: 0.1 10*3/uL (ref 0.0–0.7)
Eosinophils Relative: 2 % (ref 0–5)
MCH: 31.5 pg (ref 26.0–34.0)
MCV: 92.2 fL (ref 78.0–100.0)
Neutrophils Relative %: 50 % (ref 43–77)
Platelets: 287 10*3/uL (ref 150–400)
RDW: 13.7 % (ref 11.5–15.5)

## 2013-08-18 LAB — BASIC METABOLIC PANEL
CO2: 24 mEq/L (ref 19–32)
Calcium: 9.5 mg/dL (ref 8.4–10.5)
Creatinine, Ser: 0.57 mg/dL (ref 0.50–1.10)
Glucose, Bld: 139 mg/dL — ABNORMAL HIGH (ref 70–99)

## 2013-08-18 MED ORDER — IBUPROFEN 600 MG PO TABS
600.0000 mg | ORAL_TABLET | Freq: Four times a day (QID) | ORAL | Status: DC | PRN
  Administered 2013-08-19 (×2): 600 mg via ORAL
  Filled 2013-08-18 (×2): qty 1

## 2013-08-18 MED ORDER — CLONAZEPAM 0.5 MG PO TABS
0.5000 mg | ORAL_TABLET | Freq: Two times a day (BID) | ORAL | Status: DC | PRN
  Administered 2013-08-19: 0.5 mg via ORAL
  Filled 2013-08-18: qty 1

## 2013-08-18 MED ORDER — CLINDAMYCIN HCL 300 MG PO CAPS
300.0000 mg | ORAL_CAPSULE | Freq: Three times a day (TID) | ORAL | Status: DC
  Administered 2013-08-18 – 2013-08-22 (×12): 300 mg via ORAL
  Filled 2013-08-18 (×19): qty 1

## 2013-08-18 MED ORDER — QUETIAPINE FUMARATE 50 MG PO TABS
50.0000 mg | ORAL_TABLET | Freq: Two times a day (BID) | ORAL | Status: DC
  Administered 2013-08-18 – 2013-08-19 (×2): 50 mg via ORAL
  Filled 2013-08-18 (×4): qty 1

## 2013-08-18 MED ORDER — TRAMADOL HCL 50 MG PO TABS
50.0000 mg | ORAL_TABLET | Freq: Four times a day (QID) | ORAL | Status: DC | PRN
  Administered 2013-08-19: 50 mg via ORAL
  Filled 2013-08-18: qty 1

## 2013-08-18 NOTE — Progress Notes (Signed)
Adult Psychoeducational Group Note  Date:  08/18/2013 Time:  1300  Group Topic/Focus:  Diagnosis Education:   The focus of this group is to discuss the major disorders that patients maybe diagnosed with.  Group discusses the importance of knowing what one's diagnosis is so that one can understand treatment and better advocate for oneself.  Participation Level:  Active  Participation Quality:  Appropriate and Attentive  Affect:  Defensive  Cognitive:  Appropriate  Insight: Improving  Engagement in Group:  Limited  Modes of Intervention:  Discussion and Education  Additional Comments:  Pt was attentive but did not participate  Kamaya Keckler Shari Prows 08/18/2013, 3:04 PM

## 2013-08-18 NOTE — Progress Notes (Signed)
Adult Psychoeducational Group Note  Date:  08/18/2013 Time:  0900  Group Topic/Focus:  Identifying Needs:   The focus of this group is to help patients identify their personal needs that have been historically problematic and identify healthy behaviors to address their needs.  Participation Level:  Active  Participation Quality:  Appropriate and Attentive  Affect:  Blunted and Depressed  Cognitive:  Alert and Appropriate  Insight: Improving  Engagement in Group:  Engaged  Modes of Intervention:  Discussion, Education and Exploration  Additional Comments:  Pt was sharing and recognizes negative behavior patterns related to anger and depression.  Usha Slager Shari Prows 08/18/2013, 10:33 AM

## 2013-08-18 NOTE — Progress Notes (Signed)
[facility] Group Notes:  (Nursing/MHT/Case Management/Adjunct)  Date:  08/17/2013 Time:  2000  Type of Therapy:  Psychoeducational Skills  Participation Level:  Active  Participation Quality:  Appropriate  Affect:  Depressed  Cognitive:  Appropriate  Insight:  Good  Engagement in Group:  Developing/Improving  Modes of Intervention:  Education  Summary of Progress/Problems: The patient described her day as having been "up and down". On a positive note, she acknowledged that today was a good day in the sense that their was "less acting up" on her part. She credited her nurse on day shift with helping to de-escalate her by having a lengthy one to one talk with her. She also felt that the medication helped to keep her calm. Her goal for tomorrow are as follows: "love me" and to humble herself.   Shamina Etheridge S 08/18/2013, 1:24 AM

## 2013-08-18 NOTE — Progress Notes (Signed)
Patient ID: [name], female   DOB: November 14, 1981, 32 y.o.   MRN: [id number] D: Pt is awake and active on the unit this AM. Pt denies SI/HI and A/V hallucinations. Pt rates their depression at 5 and hopelessness at 10. Pt's mood is depressed and her affect is blunted. Pt insight is improving in relation to her anxiety, anger and ruminating thoughts and their interconnectedness. Pt is upset due to her boy friend not returning her calls and she is projecting negative scenarios becoming more and more upset. Writer encouraged pt to recognize this pattern of negative and thought and interrupt the cycle. Pt is receptive to staff input and responsive to verbal de-escalation.   A: Encouraged pt to discuss feelings with staff and administered medication per MD orders. Writer also encouraged pt to participate in groups.  R: Pt is attending groups and tolerating medications well. Writer will continue to monitor. 15 minute checks are ongoing for safety.

## 2013-08-18 NOTE — [facility] Group Notes (Signed)
[facility] Group Notes:  (Clinical Social Work)  08/18/2013   3:00-4:00PM  Summary of Progress/Problems:   The main focus of today's process group was to   identify the patient's current support system and decide on other supports that can be put in place.  The picture on workbook was used to discuss why additional supports are needed, and a hand-out was distributed with four definitions/levels of support, then used to talk about how patients have given and received all different kinds of support.  An emphasis was placed on using counselor, doctor, therapy groups, 12-step groups, and problem-specific support groups to expand supports.  The patient identified a number of supports she can add, and was insightful as to both (1)  how she may have resisted support in the past by refusing to discuss her problems and (2) how she may add supports through her interests in bowling, pool and traveling.  Type of Therapy:  Process Group  Participation Level:  Active  Participation Quality:  Attentive, Sharing and Supportive  Affect:  Blunted  Cognitive:  Oriented  Insight:  Engaged  Engagement in Therapy:  Engaged  Modes of Intervention:  Education,  Support and [employer], LCSW 08/18/2013, 2:42 PM

## 2013-08-18 NOTE — Consult Note (Signed)
[facility] Medical Consultation  [name] HYQ:[id number] DOB: [birth year] DOA: 08/13/2013 PCP: No primary provider on file.   Requesting physician: Dr. Laury Deep Date of consultation: 08/18/13 Reason for consultation: Toothache/abscess  Impression/Recommendations 1-Suicidal ideation: continue treatment as specified by Psychiatry service  2-HTN and hx of dysrhythmia: well controlled. Continue diltiazem  3-R 3rd molar toothache/abscess: will change diet to Dysphagia 3, start 10 days of cleocin TID and check a panorex. Patient needs to have her tooth extracted. If panorex indicate underlying abscess or severe pathology that require immediate treatment will contact maxillofacial surgeon, otherwise can follow with outpatient dentist. For pain will recommend ibuprofen and if severe/unrelieaved by it, will recommend tramadol.  4-Back pain: chronic. Continue combination of therapy with tylenol, ibuprofen and tramadol if needed.  I will followup patient blood work and x-ray results; she definitely need to have her tooth removed. Will recommend antibiotics coverage while this happen for a total of 10 days. If panorex required further acute intervention will discussed with maxillofacial surgeon, otherwise patient stable to finish abx's and follow up with her dentist.   Chief Complaint: toothache/ abscess  HPI:  32 y/o female with PMH significant for HTN, hx of dysrhythmia, depression and chronic back pain; admitted to [facility] due to [facility] and is under inpatient treatment by psychiatry service. We have been consulted secondary to toothache on the right side of her mouth and concerns for abscess. Patient reports pain when chewing and she exposed that section of her mouth to cold or hot temp. Patient denies fever/chills, difficulty swallowing, nausea, vomiting, SOB, CP or any other complaints  Review of Systems:  Negative except as otherwise mentioned on HPI.  Past Medical History  Diagnosis  Date  . Ectopic pregnancy   . Urinary tract infection   . Ovarian cyst   . Hypertension     was told, but never on meds  . Dysrhythmia     hx of a-fib  . Infection     UTI  . Depression     ok now  . Back pain, chronic    Past Surgical History  Procedure Laterality Date  . Etopical surgery    . Cleft palate repair    . Laparoscopy for ectopic pregnancy  2011   Social History:  reports that she has been smoking Cigarettes.  She has a 3.75 pack-year smoking history. She has never used smokeless tobacco. She reports that  drinks alcohol. She reports that she does not use illicit drugs.  No Known Allergies Family History  Problem Relation Age of Onset  . Other Neg Hx   . Hearing loss Neg Hx     Prior to Admission medications   Medication Sig Start Date End Date Taking? Authorizing Provider  acetaminophen (TYLENOL) 500 MG tablet Take 1,000 mg by mouth every 6 (six) hours as needed for pain.    Historical Provider, MD  acyclovir (ZOVIRAX) 200 MG capsule Take 1 capsule (200 mg total) by mouth 5 (five) times daily. 07/19/13   Misty Stanley A Leftwich-Kirby, CNM  diltiazem (CARDIZEM CD) 180 MG 24 hr capsule Take 1 capsule (180 mg total) by mouth daily. 01/14/13   Elease Etienne, MD  diphenhydrAMINE (BENADRYL) 25 MG tablet Take 25 mg by mouth every 6 (six) hours as needed for itching.    Historical Provider, MD  polyethylene glycol (MIRALAX / GLYCOLAX) packet Take 17 g by mouth daily. 08/13/13   Audree Camel, MD   Physical Exam: Blood pressure 103/72, pulse 84, temperature  97.5 F (36.4 C), temperature source Oral, resp. rate 16, height 5' (1.524 m), weight 53.978 kg (119 lb), last menstrual period 08/10/2013, SpO2 100.00%. Filed Vitals:   08/18/13 0701  BP: 103/72  Pulse: 84  Temp:   Resp:      General:  Afebrile, complaining of pain in her mouth when chewing or expose to hot/cold substances  Eyes: corrective glasses in place, no icterus, no nystagmus, PERRL  ENT: no exudates  inde her mouth; inflammation and roots/nerve exposure of her 3rd molar; patient half vertical destruction of this tooth. Multiple filled cavities; no drainage appreciated   Neck: supple, no JVD, no bruits  Cardiovascular: S1 and S2, RRR, no rubs or gallops, no murmurs  Respiratory: CTA bilaterally  Abdomen: soft, NT, ND, positive BS  Skin: no rash or petechiae  Musculoskeletal: no edema, no cyanosis or clubbing; reports lower back pain intermittently; some pain reported on palpation; no bones crepitations on her back.  Psychiatric: AAOX3, slightly anxious; reports some passive SI  Neurologic: CN intact, no focal motor or sensory deficit; MS 5/5  Labs on Admission:  Basic Metabolic Panel:  Recent Labs Lab 08/12/13 1415  NA 136  K 4.3  CL 100  CO2 22  GLUCOSE 73  BUN 8  CREATININE 0.51  CALCIUM 10.6*   CBC:  Recent Labs Lab 08/12/13 1415  WBC 11.3*  HGB 15.4*  HCT 44.2  MCV 91.3  PLT 289   Radiological Exams on Admission: Panorex has been ordered (currently pending)  Time spent: 50 minutes  Nandini Bogdanski [facility] Pager [phone]  If 7PM-7AM, please contact night-coverage [email or web address] Password [facility] 08/18/2013, 5:55 PM

## 2013-08-18 NOTE — Progress Notes (Signed)
D: Pt mood is depressed. Pt complaining of pain from tooth abscess. Patient rates her depression 6/10. Patient is attending groups and interacts well within the milieu. A: Support given. Verbalization encouraged. Medication given as prescribed for tooth pain. Pt encouraged to come to nurse with any concerns.  R: Pt is receptive. No complaints of pain or discomfort at this time. Q15 min checks maintained for safety. Will continue to monitor pt

## 2013-08-18 NOTE — Progress Notes (Signed)
Patient ID: [name], female   DOB: 1981/06/27, 32 y.o.   MRN: [id number]  [facility] MD Progress Note  08/18/2013 11:03 AM [name]  MRN:  [id number]  Subjective:  Patient states "I feel depressed as another person is going. I feel alone even if people are around me." She reports she has no social support.  She reports she has not told her family about her mental illness.  She reports her boyfriend had insisted that she come to the hospital.  She is complaining about a tooth abscess that has caused some pain. She reports receiving a course of antibiotics in May but did not follow up with a dentist. She admits to suicidal thoughts this morning prior to interview with this provider. She reports she could contract for safety on the unit.  Diagnosis:   DSM5:  Axis I: Bipolar I Disorder, most recent episode mixed, severe with psychotic features  ADL's:  Intact  Sleep: Poor  Appetite:  Fair  Suicidal Ideation:  Patient endorses strong suicidal ideation without intent.  Patient contracts for safety at this time.  Homicidal Ideation:  Denied  AEB (as evidenced by): Behavior on the unit. Objective:   Psychiatric Specialty Exam: Review of Systems  Constitutional: Negative.   HENT: Negative.   Eyes: Negative.   Respiratory: Negative.   Cardiovascular: Negative.   Gastrointestinal: Negative.   Genitourinary: Negative.   Musculoskeletal: Negative.   Skin: Negative.   Neurological: Negative.   Endo/Heme/Allergies: Negative.   Psychiatric/Behavioral: Positive for depression and suicidal ideas. The patient is nervous/anxious.     Blood pressure 103/72, pulse 84, temperature 97.5 F (36.4 C), temperature source Oral, resp. rate 16, height 5' (1.524 m), weight 53.978 kg (119 lb), last menstrual period 08/10/2013, SpO2 100.00%.Body mass index is 23.24 kg/(m^2).  General Appearance: Fairly Groomed and Guarded  Patent attorney::  Fair  Speech:  Clear and Coherent  Volume:  Normal   Mood:  "depressed, upset, frustrated, alone"  Affect:  Constricted and Depressed  Thought Process:  Coherent and Goal Directed  Orientation:  Full (Time, Place, and Person)  Thought Content:  Obsessions and Rumination  Suicidal Thoughts:  Yes, is able to contract for safety  Homicidal Thoughts:  No  Memory:  Immediate;   Fair  Judgement:  Impaired  Insight:  Lacking  Psychomotor Activity:  Restlessness  Concentration:  Fair  Recall:  Fair  Akathisia:  NA  Handed:  Right  AIMS (if indicated):     Assets:  Communication Skills Desire for Improvement Resilience Social Support Transportation  Sleep:  Number of Hours: 6.75   Current Medications: Current Facility-Administered Medications  Medication Dose Route Frequency Provider Last Rate Last Dose  . acetaminophen (TYLENOL) tablet 650 mg  650 mg Oral Q6H PRN Nelly Rout, MD   650 mg at 08/17/13 2236  . alum & mag hydroxide-simeth (MAALOX/MYLANTA) 200-200-20 MG/5ML suspension 30 mL  30 mL Oral Q4H PRN Nelly Rout, MD      . clonazePAM Scarlette Calico) tablet 0.5 mg  0.5 mg Oral BID Nehemiah Settle, MD   0.5 mg at 08/18/13 0809  . divalproex (DEPAKOTE ER) 24 hr tablet 750 mg  750 mg Oral QHS Nehemiah Settle, MD   750 mg at 08/17/13 2127  . hydrOXYzine (ATARAX/VISTARIL) tablet 25 mg  25 mg Oral TID PRN Nelly Rout, MD   25 mg at 08/17/13 1515  . ibuprofen (ADVIL,MOTRIN) tablet 400 mg  400 mg Oral QID Larena Sox, MD  400 mg at 08/18/13 0809  . magnesium hydroxide (MILK OF MAGNESIA) suspension 30 mL  30 mL Oral Daily PRN Nelly Rout, MD      . nicotine (NICODERM CQ - dosed in mg/24 hours) patch 14 mg  14 mg Transdermal Daily Nehemiah Settle, MD   14 mg at 08/18/13 0809  . QUEtiapine (SEROQUEL) tablet 100 mg  100 mg Oral QHS Nehemiah Settle, MD   100 mg at 08/17/13 2127  . QUEtiapine (SEROQUEL) tablet 50 mg  50 mg Oral BID PRN Larena Sox, MD   50 mg at 08/17/13 1540  . traZODone  (DESYREL) tablet 50 mg  50 mg Oral QHS Verne Spurr, PA-C   50 mg at 08/17/13 2127    Lab Results: No results found for this or any previous visit (from the past 48 hour(s)).  Physical Findings: AIMS: Facial and Oral Movements Muscles of Facial Expression: None, normal Lips and Perioral Area: None, normal Jaw: None, normal,Extremity Movements Upper (arms, wrists, hands, fingers): None, normal Lower (legs, knees, ankles, toes): None, normal, Trunk Movements Neck, shoulders, hips: None, normal, Overall Severity Severity of abnormal movements (highest score from questions above): None, normal Incapacitation due to abnormal movements: None, normal Patient's awareness of abnormal movements (rate only patient's report): No Awareness, Dental Status Current problems with teeth and/or dentures?: No Does patient usually wear dentures?: No  CIWA:  CIWA-Ar Total: 4 COWS:  COWS Total Score: 2  Treatment Plan Summary: Daily contact with patient to assess and evaluate symptoms and progress in treatment Medication management  Plan: 1. Continue crisis management and stabilization. 2. Medication management to reduce current symptoms to base line and improve the patient's overall level of functioning. 3. Treat health problems as indicated. 4. Develop treatment plan to decrease risk of relapse upon discharge and to reduce the need for readmission. 5. Psycho-social education regarding relapse prevention and self care. 6. Health care follow up as needed for medical problems. 7. disposition plans are in progress and length of stay 3 days 8. Continue Klonopin 0.5 mg BID change to PRN for anxiety. Continue Depakote 750 mg at bedtime for improved mood stability. Increase  Seroquel 100 mg at bedtime for improved mood stability with 50 mg BID.  9. Internal medicine consult for tooth abscess for recommendations for antibiotics.  Medical Decision Making Problem Points:  Established problem, worsening (2),  New problem, with additional work-up planned (4), Review of last therapy session (1) and Review of psycho-social stressors (1) Data Points:  Review or order clinical lab tests (1) Review or order medicine tests (1) Review of medication regiment & side effects (2) Review of new medications or change in dosage (2)  I certify that inpatient services furnished can reasonably be expected to improve the patient's condition.     Jacqulyn Cane, M.D.  08/18/2013 11:03 AM

## 2013-08-19 ENCOUNTER — Ambulatory Visit (HOSPITAL_COMMUNITY)
Admit: 2013-08-19 | Discharge: 2013-08-19 | Disposition: A | Discharge status: Home / Self Care | Payer: Medicaid Other | Attending: Internal Medicine | Admitting: Internal Medicine

## 2013-08-19 DIAGNOSIS — K029 Dental caries, unspecified: Secondary | ICD-10-CM | POA: Insufficient documentation

## 2013-08-19 DIAGNOSIS — K1370 Unspecified lesions of oral mucosa: Secondary | ICD-10-CM | POA: Insufficient documentation

## 2013-08-19 DIAGNOSIS — F3164 Bipolar disorder, current episode mixed, severe, with psychotic features: Secondary | ICD-10-CM

## 2013-08-19 MED ORDER — QUETIAPINE FUMARATE 200 MG PO TABS
200.0000 mg | ORAL_TABLET | Freq: Every day | ORAL | Status: DC
  Administered 2013-08-19 – 2013-08-21 (×3): 200 mg via ORAL
  Filled 2013-08-19 (×4): qty 1
  Filled 2013-08-19: qty 14

## 2013-08-19 MED ORDER — CLONAZEPAM 1 MG PO TABS
1.0000 mg | ORAL_TABLET | Freq: Two times a day (BID) | ORAL | Status: DC | PRN
  Administered 2013-08-19 – 2013-08-22 (×2): 1 mg via ORAL
  Filled 2013-08-19 (×2): qty 1

## 2013-08-19 MED ORDER — TRAMADOL HCL 50 MG PO TABS
100.0000 mg | ORAL_TABLET | Freq: Four times a day (QID) | ORAL | Status: DC | PRN
  Administered 2013-08-19 – 2013-08-20 (×2): 100 mg via ORAL
  Filled 2013-08-19: qty 2
  Filled 2013-08-19 (×2): qty 1

## 2013-08-19 NOTE — Clinical Social Work Note (Signed)
CSW met with pt individually this afternoon. Pt asked about how to get started applying for disability benefits. CSW informed pt that she would need to go to Social Security office in order to apply for disability benefits. (information to be provided to pt including address). Pt also requested letter be written at d/c for her with: diagnosis, dates admitted at [facility] and reason, limited functionality due to diagnosis, and plan for aftercare in order to support case for disability.

## 2013-08-19 NOTE — Progress Notes (Signed)
Recreation Therapy Notes  Date: 09.15.2014 Time: 3:00pm Location: [address] Dayroom  Group Topic: Coping Skills  Goal Area(s) Addresses:  Patient will verbalize importance of recognizing emotions. Patient will identify at least one emotion. Patient will successfully represent varying emotions in pictures or words.   Behavioral Response: Appropriate, Attentive, Engaged  Intervention: Art  Activity: Emotion Wheel. As a group patients identified 8 emotions. Using the provided worksheet patients were asked to represent emotions identified by group in pictures of words.    Education: Emotional Recognition, Emotional Regulation, Coping Skills  Education Outcome: Acknowledges Understanding  Clinical Observations/Feedback: Patient contributed to group discussion, identifying emotions with group members. Patient actively participated in depicting identified emotions on her worksheet using faces. Patient participated in group discussion, discussing the importance of being able to identify specific emotions, as well as identifying a coping mechanism she can use when needed. Patient verbalized understanding of relationships of emotions on worksheet to each other, as well as emotional balance as it contributes to whole wellness.   Marykay Lex Maeven Mcdougall, LRT/CTRS  Graceanne Guin L 08/19/2013 5:07 PM

## 2013-08-19 NOTE — [facility] Group Notes (Signed)
[facility] LCSW Aftercare Discharge Planning Group Note   08/19/2013 8:45 AM  Participation Quality:  Alert and Appropriate   Mood/Affect:  Appropriate, Flat and Depressed  Depression Rating:  10  Anxiety Rating:  10  Thoughts of Suicide:  Pt denies HI, endorses SI  Will you contract for safety?   Yes  Current AVH:  Pt denies   Plan for Discharge/Comments:  Pt attended discharge planning group and actively participated in group.  CSW provided pt with today's workbook.  Pt states that she is "fair" today.  Pt states that her plan is to stay on her meds and not kill herself.  Pt states that she will return home in [place].  Pt will follow up at [facility] for medication management and therapy.  No further needs voiced by pt at this time.    Transportation Means:  Pt reports access to transportation - will need a bus pass  Supports: No supports mentioned  Reyes Ivan, LCSWA 08/19/2013 9:55 AM

## 2013-08-19 NOTE — Tx Team (Addendum)
Interdisciplinary Treatment Plan Update (Adult)  Date: 08/19/2013  Time Reviewed:  9:45 AM  Progress in Treatment: Attending groups: Yes Participating in groups:  Yes Taking medication as prescribed:  Yes Tolerating medication:  Yes Family/Significant othe contact made: Yes Patient understands diagnosis:  Yes Discussing patient identified problems/goals with staff:  Yes Medical problems stabilized or resolved:  Yes Denies suicidal/homicidal ideation: No, endorses SI today Issues/concerns per patient self-inventory:  Yes Other:  New problem(s) identified: N/A  Discharge Plan or Barriers: Pt has follow up scheduled at [facility] for medication management and therapy.    Reason for Continuation of Hospitalization: Anxiety Depression Suicidal Ideation Medication Stabilization  Comments: N/A  Estimated length of stay: 2-3 days  For review of initial/current patient goals, please see plan of care.  Attendees: Patient:     Family:     Physician:  Dr. Javier Glazier 08/19/2013 12:40 PM   Nursing:   Neill Loft, RN 08/19/2013 12:40 PM   Clinical Social Worker:  Reyes Ivan, LCSWA 08/19/2013 12:40 PM   Other: Verne Spurr, PA 08/19/2013 12:40 PM   Other:   Other:    Other:     Other:    Other:    Other:    Other:    Other:    Other:     Scribe for Treatment Team:   Carmina Miller, 08/19/2013 12:40 PM

## 2013-08-19 NOTE — Progress Notes (Signed)
Patient ID: [name], female   DOB: 09/12/1981, 32 y.o.   MRN: [id number]  [facility] MD Progress Note  08/19/2013 1:59 PM [name]  MRN:  [id number]  Subjective:  Patient has been anxious and depressed throughout day and than because suicidal ideation prior this assessment. She feels like punching the walls but self restrained her self. She stated that she feels better first thing in the morning. She is feeling agitated and frustrated and feeling restless. She feels that she wants to be discharged because every body here came the same timer are gone. She did not sleep well because of her toothache. She was evaluated by internist and recommended antibiotics. She reports her boyfriend did not call her. She feels stressed about it. She reports receiving a course of antibiotics in May but did not follow up with a dentist. She reports she could contract for safety on the unit.  Diagnosis:   DSM5:  Axis I: Bipolar I Disorder, most recent episode mixed, severe with psychotic features  ADL's:  Intact  Sleep: Poor  Appetite:  Fair  Suicidal Ideation:  Patient endorses strong suicidal ideation without intent.  Patient contracts for safety at this time.  Homicidal Ideation:  Denied  AEB (as evidenced by): Behavior on the unit. Objective:   Psychiatric Specialty Exam: Review of Systems  Constitutional: Negative.   HENT: Negative.   Eyes: Negative.   Respiratory: Negative.   Cardiovascular: Negative.   Gastrointestinal: Negative.   Genitourinary: Negative.   Musculoskeletal: Negative.   Skin: Negative.   Neurological: Negative.   Endo/Heme/Allergies: Negative.   Psychiatric/Behavioral: Positive for depression and suicidal ideas. The patient is nervous/anxious.     Blood pressure 122/89, pulse 102, temperature 98.2 F (36.8 C), temperature source Oral, resp. rate 16, height 5' (1.524 m), weight 53.978 kg (119 lb), last menstrual period 08/10/2013, SpO2 100.00%.Body mass  index is 23.24 kg/(m^2).  General Appearance: Fairly Groomed and Guarded  Patent attorney::  Fair  Speech:  Clear and Coherent  Volume:  Normal  Mood:  "depressed, upset, frustrated, alone"  Affect:  Constricted and Depressed  Thought Process:  Coherent and Goal Directed  Orientation:  Full (Time, Place, and Person)  Thought Content:  Obsessions and Rumination  Suicidal Thoughts:  Yes, is able to contract for safety  Homicidal Thoughts:  No  Memory:  Immediate;   Fair  Judgement:  Impaired  Insight:  Lacking  Psychomotor Activity:  Restlessness  Concentration:  Fair  Recall:  Fair  Akathisia:  NA  Handed:  Right  AIMS (if indicated):     Assets:  Communication Skills Desire for Improvement Resilience Social Support Transportation  Sleep:  Number of Hours: 6.5   Current Medications: Current Facility-Administered Medications  Medication Dose Route Frequency Provider Last Rate Last Dose  . acetaminophen (TYLENOL) tablet 650 mg  650 mg Oral Q6H PRN Nelly Rout, MD   650 mg at 08/17/13 2236  . alum & mag hydroxide-simeth (MAALOX/MYLANTA) 200-200-20 MG/5ML suspension 30 mL  30 mL Oral Q4H PRN Nelly Rout, MD      . clindamycin (CLEOCIN) capsule 300 mg  300 mg Oral Q8H Vassie Loll, MD   300 mg at 08/19/13 1338  . clonazePAM (KLONOPIN) tablet 0.5 mg  0.5 mg Oral BID PRN Larena Sox, MD   0.5 mg at 08/19/13 0819  . divalproex (DEPAKOTE ER) 24 hr tablet 750 mg  750 mg Oral QHS Nehemiah Settle, MD   750 mg at 08/18/13 2121  .  hydrOXYzine (ATARAX/VISTARIL) tablet 25 mg  25 mg Oral TID PRN Nelly Rout, MD   25 mg at 08/18/13 1814  . ibuprofen (ADVIL,MOTRIN) tablet 600 mg  600 mg Oral Q6H PRN Vassie Loll, MD   600 mg at 08/19/13 0819  . magnesium hydroxide (MILK OF MAGNESIA) suspension 30 mL  30 mL Oral Daily PRN Nelly Rout, MD      . nicotine (NICODERM CQ - dosed in mg/24 hours) patch 14 mg  14 mg Transdermal Daily Nehemiah Settle, MD   14 mg at 08/19/13  0815  . QUEtiapine (SEROQUEL) tablet 100 mg  100 mg Oral QHS Nehemiah Settle, MD   100 mg at 08/18/13 2121  . QUEtiapine (SEROQUEL) tablet 50 mg  50 mg Oral BID Larena Sox, MD   50 mg at 08/19/13 0813  . traMADol (ULTRAM) tablet 50 mg  50 mg Oral Q6H PRN Vassie Loll, MD   50 mg at 08/19/13 1253  . traZODone (DESYREL) tablet 50 mg  50 mg Oral QHS Verne Spurr, PA-C   50 mg at 08/18/13 2121    Lab Results:  Results for orders placed during the hospital encounter of 08/13/13 (from the past 48 hour(s))  BASIC METABOLIC PANEL     Status: Abnormal   Collection Time    08/18/13  7:27 PM      Result Value Range   Sodium 136  135 - 145 mEq/L   Potassium 4.2  3.5 - 5.1 mEq/L   Chloride 102  96 - 112 mEq/L   CO2 24  19 - 32 mEq/L   Glucose, Bld 139 (*) 70 - 99 mg/dL   BUN 12  6 - 23 mg/dL   Creatinine, Ser 1.47  0.50 - 1.10 mg/dL   Calcium 9.5  8.4 - 82.9 mg/dL   GFR calc non Af Amer >90  >90 mL/min   GFR calc Af Amer >90  >90 mL/min   Comment: (NOTE)     The eGFR has been calculated using the CKD EPI equation.     This calculation has not been validated in all clinical situations.     eGFR's persistently <90 mL/min signify possible Chronic Kidney     Disease.     Performed at [facility]  CBC WITH DIFFERENTIAL     Status: None   Collection Time    08/18/13  7:27 PM      Result Value Range   WBC 6.8  4.0 - 10.5 K/uL   RBC 4.51  3.87 - 5.11 MIL/uL   Hemoglobin 14.2  12.0 - 15.0 g/dL   HCT 56.2  13.0 - 86.5 %   MCV 92.2  78.0 - 100.0 fL   MCH 31.5  26.0 - 34.0 pg   MCHC 34.1  30.0 - 36.0 g/dL   RDW 78.4  69.6 - 29.5 %   Platelets 287  150 - 400 K/uL   Neutrophils Relative % 50  43 - 77 %   Neutro Abs 3.4  1.7 - 7.7 K/uL   Lymphocytes Relative 37  12 - 46 %   Lymphs Abs 2.5  0.7 - 4.0 K/uL   Monocytes Relative 10  3 - 12 %   Monocytes Absolute 0.7  0.1 - 1.0 K/uL   Eosinophils Relative 2  0 - 5 %   Eosinophils Absolute 0.1  0.0 - 0.7 K/uL    Basophils Relative 1  0 - 1 %   Basophils Absolute 0.1  0.0 - 0.1 K/uL   Comment: Performed at [facility]    Physical Findings: AIMS: Facial and Oral Movements Muscles of Facial Expression: None, normal Lips and Perioral Area: None, normal Jaw: None, normal Tongue: None, normal,Extremity Movements Upper (arms, wrists, hands, fingers): None, normal Lower (legs, knees, ankles, toes): None, normal, Trunk Movements Neck, shoulders, hips: None, normal, Overall Severity Severity of abnormal movements (highest score from questions above): None, normal Incapacitation due to abnormal movements: None, normal Patient's awareness of abnormal movements (rate only patient's report): No Awareness, Dental Status Current problems with teeth and/or dentures?: Yes Does patient usually wear dentures?: No  CIWA:  CIWA-Ar Total: 4 COWS:  COWS Total Score: 2  Treatment Plan Summary: Daily contact with patient to assess and evaluate symptoms and progress in treatment Medication management  Plan: 1. Continue crisis management and stabilization. 2. Medication management to reduce current symptoms to base line and improve the patient's overall level of functioning. 3. Treat health problems as indicated. 4. Develop treatment plan to decrease risk of relapse upon discharge and to reduce the need for readmission. 5. Psycho-social education regarding relapse prevention and self care. 6. Health care follow up as needed for medical problems. 7. disposition plans are in progress and length of stay 3 days 8. Increase Klonopin 1 mg BID for anxiety.  9. Continue Depakote 750 mg at bedtime for improved mood stability.  10 Increase  Seroquel 200 mg BID.  9. Internal medicine consult for tooth abscess for recommendations for antibiotics.  Medical Decision Making Problem Points:  Established problem, worsening (2), New problem, with additional work-up planned (4), Review of last therapy session  (1) and Review of psycho-social stressors (1) Data Points:  Review or order clinical lab tests (1) Review or order medicine tests (1) Review of medication regiment & side effects (2) Review of new medications or change in dosage (2)  I certify that inpatient services furnished can reasonably be expected to improve the patient's condition.   Aileena Iglesia,JANARDHAHA R. 08/19/2013 2:06 PM

## 2013-08-19 NOTE — Progress Notes (Signed)
Adult Psychoeducational Group Note  Date:  08/19/2013 Time:  10:41 PM  Group Topic/Focus:  Goals Group:   The focus of this group is to help patients establish daily goals to achieve during treatment and discuss how the patient can incorporate goal setting into their daily lives to aide in recovery.   Participation Level:  Active  Participation Quality:  Appropriate  Affect:  Appropriate  Cognitive:  Appropriate  Insight: Appropriate  Engagement in Group:  Engaged  Modes of Intervention:  Discussion  Additional Comments:  Pt stated she had a pretty good day, ready to get her tooth pulled tomorrow.  Terie Purser R 08/19/2013, 10:41 PM

## 2013-08-19 NOTE — Progress Notes (Signed)
Patient ID: [name], female   DOB: 1981-10-19, 32 y.o.   MRN: [id number]  D: Patient pleasant with bright affect on approach. Pt interacting well with patch. A: Monitor Q 15 minutes for safety, encourage staff/peer interaction, medication compliance, and group participation. R: Patient compliant with medications and group session. Pt denies SI/HI.

## 2013-08-19 NOTE — Progress Notes (Signed)
Pt c/o LL jaw/tooth pain. Order written yesterday for pt to get xray of this area. Pt sent to [facility] to r/o abcessed tooth.

## 2013-08-19 NOTE — Progress Notes (Signed)
[facility] Group Notes:  (Nursing/MHT/Case Management/Adjunct)  Date:  08/18/2013 Time:  2000  Type of Therapy:  Psychoeducational Skills  Participation Level:  Active  Participation Quality:  Appropriate  Affect:  Appropriate  Cognitive:  Appropriate  Insight:  Appropriate  Engagement in Group:  Engaged  Modes of Intervention:  Education  Summary of Progress/Problems: The patient described her day as having been "fair". She stated that she felt "down" at times, but she later found a way to make her peers laugh. Her goal for tomorrow is to go home.   Titan Karner S 08/19/2013, 1:37 AM

## 2013-08-19 NOTE — Progress Notes (Addendum)
Pt c/o left lower jaw pain due to a bad tooth. Pt does appear to have slight facial swelling and was given 50 mg of tramadol po at 12:55pm for dental pain  A 1o/10. Pt was also given an ice pack as well.She stated she has no money to have her tooth removed by oral surgery. Spoke with [employer] , social worker who will make some calls and phone. Pt at this time does not have difficulty swallowing. She rates her depression a 5/10 dues to tooth pain. Pt does contract for safety. She did return form  The ED after having an x-ray of her jaw. Pt does remain on antioboitics. Phoned Dr Manson Passey office. Pt will go to Dr. Alexander Mt office tomorrow for a 12:15pm appointment. Pt instructed to be NPO after midnight in the even she has surgery tomorrow.pt very appreciative  And began to cry. She stated,"this tooth has been killing me since January. "Pt denies any chest pain or feeling short of breath. Denies any fevers.

## 2013-08-19 NOTE — [facility] Group Notes (Signed)
[facility] LCSW Group Therapy  08/19/2013  1:15 PM   Type of Therapy:  Group Therapy  Participation Level:  Did Not Attend - pt was pulled from group by staff  Reyes Ivan, LCSWA 08/19/2013 2:42 PM

## 2013-08-20 MED ORDER — AMOXICILLIN 500 MG PO CAPS
500.0000 mg | ORAL_CAPSULE | Freq: Three times a day (TID) | ORAL | Status: DC
  Administered 2013-08-21: 500 mg via ORAL
  Filled 2013-08-20 (×7): qty 1

## 2013-08-20 MED ORDER — HYDROCODONE-ACETAMINOPHEN 10-325 MG PO TABS
1.0000 | ORAL_TABLET | ORAL | Status: DC | PRN
  Administered 2013-08-20 – 2013-08-22 (×5): 1 via ORAL
  Filled 2013-08-20 (×5): qty 1

## 2013-08-20 MED ORDER — DEXAMETHASONE 4 MG PO TABS
4.0000 mg | ORAL_TABLET | Freq: Three times a day (TID) | ORAL | Status: DC
  Administered 2013-08-21: 4 mg via ORAL
  Filled 2013-08-20 (×7): qty 1

## 2013-08-20 NOTE — Progress Notes (Signed)
Patient did not attend 0900 RN group. 

## 2013-08-20 NOTE — Progress Notes (Signed)
Patient ID: [name], female   DOB: [birth year], 32 y.o.   MRN: [id number]  [facility] MD Progress Note  08/20/2013 2:37 PM [name]  MRN:  [id number]  Subjective:  Patient complaint of dental pain and asking pain medication prescribed by dentist during this morning consult. Reportedly she is scheduled tomorrow morning 8AM for dental surgery. She has anxiety and depressed because of the pain. She stated that she feels better after dental examination and now says that she is waiting for tomorrow procedure. She has slept well but has waken up for antibiotics. She has less agitated and frustrated today. She has been in contact with her mother and feels she will be ready to be discharged. She has been adjusting to her psych medications without adverse effects. She reports she could contract for safety on the unit.  Diagnosis:   DSM5:  Axis I: Bipolar I Disorder, most recent episode mixed, severe with psychotic features  ADL's:  Intact  Sleep: Poor  Appetite:  Fair  Suicidal Ideation:  Patient endorses strong suicidal ideation without intent.  Patient contracts for safety at this time.  Homicidal Ideation:  Denied  AEB (as evidenced by): Behavior on the unit.   Psychiatric Specialty Exam: Review of Systems  Constitutional: Negative.   HENT: Negative.   Eyes: Negative.   Respiratory: Negative.   Cardiovascular: Negative.   Gastrointestinal: Negative.   Genitourinary: Negative.   Musculoskeletal: Negative.   Skin: Negative.   Neurological: Negative.   Endo/Heme/Allergies: Negative.   Psychiatric/Behavioral: Positive for depression and suicidal ideas. The patient is nervous/anxious.     Blood pressure 137/99, pulse 102, temperature 97.3 F (36.3 C), temperature source Oral, resp. rate 18, height 5' (1.524 m), weight 53.978 kg (119 lb), last menstrual period 08/10/2013, SpO2 100.00%.Body mass index is 23.24 kg/(m^2).  General Appearance: Fairly Groomed and Guarded  Proofreader::  Fair  Speech:  Clear and Coherent  Volume:  Normal  Mood:  "depressed, upset, frustrated, alone"  Affect:  Constricted and Depressed  Thought Process:  Coherent and Goal Directed  Orientation:  Full (Time, Place, and Person)  Thought Content:  Obsessions and Rumination  Suicidal Thoughts:  Yes, is able to contract for safety  Homicidal Thoughts:  No  Memory:  Immediate;   Fair  Judgement:  Impaired  Insight:  Lacking  Psychomotor Activity:  Restlessness  Concentration:  Fair  Recall:  Fair  Akathisia:  NA  Handed:  Right  AIMS (if indicated):     Assets:  Communication Skills Desire for Improvement Resilience Social Support Transportation  Sleep:  Number of Hours: 5   Current Medications: Current Facility-Administered Medications  Medication Dose Route Frequency Provider Last Rate Last Dose  . acetaminophen (TYLENOL) tablet 650 mg  650 mg Oral Q6H PRN Nelly Rout, MD   650 mg at 08/17/13 2236  . alum & mag hydroxide-simeth (MAALOX/MYLANTA) 200-200-20 MG/5ML suspension 30 mL  30 mL Oral Q4H PRN Nelly Rout, MD      . clindamycin (CLEOCIN) capsule 300 mg  300 mg Oral Q8H Vassie Loll, MD   300 mg at 08/20/13 0703  . clonazePAM (KLONOPIN) tablet 1 mg  1 mg Oral BID PRN Nehemiah Settle, MD   1 mg at 08/19/13 1833  . divalproex (DEPAKOTE ER) 24 hr tablet 750 mg  750 mg Oral QHS Nehemiah Settle, MD   750 mg at 08/19/13 2131  . hydrOXYzine (ATARAX/VISTARIL) tablet 25 mg  25 mg Oral TID PRN Nelly Rout,  MD   25 mg at 08/19/13 2356  . ibuprofen (ADVIL,MOTRIN) tablet 600 mg  600 mg Oral Q6H PRN Vassie Loll, MD   600 mg at 08/19/13 2355  . magnesium hydroxide (MILK OF MAGNESIA) suspension 30 mL  30 mL Oral Daily PRN Nelly Rout, MD      . nicotine (NICODERM CQ - dosed in mg/24 hours) patch 14 mg  14 mg Transdermal Daily Nehemiah Settle, MD   14 mg at 08/19/13 0815  . QUEtiapine (SEROQUEL) tablet 200 mg  200 mg Oral QHS Nehemiah Settle, MD   200 mg at 08/19/13 2131  . traMADol (ULTRAM) tablet 100 mg  100 mg Oral Q6H PRN Nehemiah Settle, MD   100 mg at 08/19/13 2137  . traZODone (DESYREL) tablet 50 mg  50 mg Oral QHS Verne Spurr, PA-C   50 mg at 08/19/13 2131    Lab Results:  Results for orders placed during the hospital encounter of 08/13/13 (from the past 48 hour(s))  BASIC METABOLIC PANEL     Status: Abnormal   Collection Time    08/18/13  7:27 PM      Result Value Range   Sodium 136  135 - 145 mEq/L   Potassium 4.2  3.5 - 5.1 mEq/L   Chloride 102  96 - 112 mEq/L   CO2 24  19 - 32 mEq/L   Glucose, Bld 139 (*) 70 - 99 mg/dL   BUN 12  6 - 23 mg/dL   Creatinine, Ser 7.82  0.50 - 1.10 mg/dL   Calcium 9.5  8.4 - 95.6 mg/dL   GFR calc non Af Amer >90  >90 mL/min   GFR calc Af Amer >90  >90 mL/min   Comment: (NOTE)     The eGFR has been calculated using the CKD EPI equation.     This calculation has not been validated in all clinical situations.     eGFR's persistently <90 mL/min signify possible Chronic Kidney     Disease.     Performed at [facility]  CBC WITH DIFFERENTIAL     Status: None   Collection Time    08/18/13  7:27 PM      Result Value Range   WBC 6.8  4.0 - 10.5 K/uL   RBC 4.51  3.87 - 5.11 MIL/uL   Hemoglobin 14.2  12.0 - 15.0 g/dL   HCT 21.3  08.6 - 57.8 %   MCV 92.2  78.0 - 100.0 fL   MCH 31.5  26.0 - 34.0 pg   MCHC 34.1  30.0 - 36.0 g/dL   RDW 46.9  62.9 - 52.8 %   Platelets 287  150 - 400 K/uL   Neutrophils Relative % 50  43 - 77 %   Neutro Abs 3.4  1.7 - 7.7 K/uL   Lymphocytes Relative 37  12 - 46 %   Lymphs Abs 2.5  0.7 - 4.0 K/uL   Monocytes Relative 10  3 - 12 %   Monocytes Absolute 0.7  0.1 - 1.0 K/uL   Eosinophils Relative 2  0 - 5 %   Eosinophils Absolute 0.1  0.0 - 0.7 K/uL   Basophils Relative 1  0 - 1 %   Basophils Absolute 0.1  0.0 - 0.1 K/uL   Comment: Performed at [facility]    Physical Findings: AIMS:  Facial and Oral Movements Muscles of Facial Expression: None, normal Lips and Perioral Area: None, normal  Jaw: None, normal Tongue: None, normal,Extremity Movements Upper (arms, wrists, hands, fingers): None, normal Lower (legs, knees, ankles, toes): None, normal, Trunk Movements Neck, shoulders, hips: None, normal, Overall Severity Severity of abnormal movements (highest score from questions above): None, normal Incapacitation due to abnormal movements: None, normal Patient's awareness of abnormal movements (rate only patient's report): No Awareness, Dental Status Current problems with teeth and/or dentures?: Yes Does patient usually wear dentures?: No  CIWA:  CIWA-Ar Total: 4 COWS:  COWS Total Score: 2  Treatment Plan Summary: Daily contact with patient to assess and evaluate symptoms and progress in treatment Medication management  Plan: 1. Continue crisis management and stabilization. 2. Medication management to reduce current symptoms to base line and improve the patient's overall level of functioning. 3. Treat health problems as indicated. 4. Develop treatment plan to decrease risk of relapse upon discharge and to reduce the need for readmission. 5. Psycho-social education regarding relapse prevention and self care. 6. Health care follow up as needed for medical problems. 7. disposition plans are in progress and length of stay 3 days 8. Continue Klonopin 1 mg BID for anxiety.  9. Continue Depakote 750 mg at bedtime for improved mood stability.  10. Continue  Seroquel 200 mg BID.  11. Appreciate dental consult and arrange for her to go to her am scheduled appointment.  12. Will start medications as prescribed by dental office.    Medical Decision Making Problem Points:  Established problem, worsening (2), New problem, with additional work-up planned (4), Review of last therapy session (1) and Review of psycho-social stressors (1) Data Points:  Review or order clinical lab  tests (1) Review or order medicine tests (1) Review of medication regiment & side effects (2) Review of new medications or change in dosage (2)  I certify that inpatient services furnished can reasonably be expected to improve the patient's condition.   [name],JANARDHAHA R. 08/20/2013 2:37 PM

## 2013-08-20 NOTE — Progress Notes (Signed)
D: Patient denies HI and A/V hallucinations and reports on and off thoughts of SI; patient reports sleep is well; reports appetite is good ; reports energy level is normal ; reports ability to pay attention is good; rates depression as 5/10; rates hopelessness 7/10;   A: Monitored q 15 minutes; patient encouraged to attend groups; patient educated about medications; patient given medications per physician orders; patient encouraged to express feelings and/or concerns  R: Patient is animated and cooperative; patient is appropriate to all circumstances; patient's interaction with staff and peers is appropriate; patient was able to set goal to talk with staff 1:1 when having feelings of SI; patient is taking medications as prescribed and tolerating medications; patient is attending all groups and engaging

## 2013-08-20 NOTE — Progress Notes (Signed)
D: Pt is reporting an improvement in her mood this evening. However, pt still endorses some anxiety. Pt has been taking note of her characteristics that constitute her Bipolar I diagnosis. She is highly anticipating her Dr. Alfonzo Beers on Tuesday to get her tooth extracted. Pt continues to report excruciating mouth pain. Pt administered Tramadol and Ibuprofen to decrease pain. Pt attended group this evening. Pt observed interacting appropriately within the milieu.  A: Writer administered scheduled and prn medications to pt. Continued support and availability as needed was extended to this pt. Staff continue to monitor pt with q81min checks.  R: No adverse drug reactions noted. Pt receptive to treatment. Pt remains safe at this time.

## 2013-08-20 NOTE — Progress Notes (Signed)
Represenative from dentist office talked with charge nurse and charge nurse reported that patient is to be NPO after midnight except medications and patient is to leave fot procedure at 0745 hrs

## 2013-08-20 NOTE — Progress Notes (Signed)
Recreation Therapy Notes  Date: 09.16.2014 Time: 2:45pm Location: [address] Dayroom  Group Topic: Animal Assisted Activities (AAA)  Behavioral Response: Did not attend. Per patient consent form patient declined all AAA services during admission.   Marykay Lex Jeramy Dimmick, LRT/CTRS  Jearl Klinefelter 08/20/2013 4:51 PM

## 2013-08-20 NOTE — [facility] Group Notes (Signed)
[facility] LCSW Group Therapy      Feelings About Diagnosis 1:15 - 2:30 PM         08/20/2013  3:19 PM    Type of Therapy:  Group Therapy  Participation Level:  Did not attend group.  Wynn Banker 08/20/2013  3:19 PM

## 2013-08-20 NOTE — Progress Notes (Signed)
Adult Psychoeducational Group Note  Date:  08/20/2013 Time:  11:00am Group Topic/Focus:  Recovery Goals:   The focus of this group is to identify appropriate goals for recovery and establish a plan to achieve them.  Participation Level:  Active  Participation Quality:  Appropriate and Attentive  Affect:  Appropriate  Cognitive:  Alert and Appropriate  Insight: Appropriate  Engagement in Group:  Engaged  Modes of Intervention:  Discussion and Education  Additional Comments:  Pt attended and participated in group. When ask what recovery means to her pt stated having it together and not depending on her peers for support all the time.  Shelly Bombard D 08/20/2013, 1:43 PM

## 2013-08-21 MED ORDER — DEXAMETHASONE 4 MG PO TABS
4.0000 mg | ORAL_TABLET | Freq: Three times a day (TID) | ORAL | Status: DC
  Administered 2013-08-21 – 2013-08-22 (×3): 4 mg via ORAL
  Filled 2013-08-21 (×8): qty 1

## 2013-08-21 MED ORDER — DIVALPROEX SODIUM ER 500 MG PO TB24
1000.0000 mg | ORAL_TABLET | Freq: Every day | ORAL | Status: DC
  Filled 2013-08-21: qty 2
  Filled 2013-08-21: qty 28

## 2013-08-21 NOTE — Progress Notes (Signed)
Recreation Therapy Notes  Date: 09.17.2014 Time: 3:00pm Location: [address] Dayroom  Group Topic: Communication, Team Building, Problem Solving  Goal Area(s) Addresses:  Patient will effectively work with peer towards shared goal.  Patient will identify skill used to make activity successful.  Patient will identify how skills used during activity can be used to reach post d/c goals.   Behavioral Response: Engaged, Attentive, Appropriate  Intervention: Problem Solving Task  Activity: [employer]. In small groups (3-4 patients) patients were given the following supplies and were asked to work together to build a launching mechanism to launch a ping pong ball 12 feet. Supplies: 5 drinking straws, 5 rubber bands, 5 paper clips, 2 index cards, 2 paper cups, and 2 toilet paper rolls.    Education: Customer service manager, Discharge Planning   Education Outcome: Acknowledges understanding  Clinical Observations/Feedback: Patient contributed to opening discussion, defining personal development for group. At approximately 3:05pm RN asked patient to leave group session, patient returned at approximately 3;20pm. Upon returning patient observed peer interactions in group activity. Patient contributed to wrap up discussion identifying qualities that make communication and team work successful. Additionally patient actively listened to peers statements, as she maintained appropriate eye contact with speaker.   Marykay Lex Delorus Langwell, LRT/CTRS  Brendon Christoffel L 08/21/2013 5:01 PM

## 2013-08-21 NOTE — Progress Notes (Signed)
Patient ID: [name], female   DOB: [birth year], 32 y.o.   MRN: [id number]  [facility] MD Progress Note  08/21/2013 3:32 PM NAYAB ATEN  MRN:  [id number]  Subjective:  Patient notes that she is irritated that her Boy Friend has not returned her call. She did have her tooth extracted today and says that she is a little sore from this. She is anxious and emotionally labile today, focuses on not having a job and not being alone.          Diagnosis:   DSM5: Axis I: Bipolar I Disorder, most recent episode mixed, severe with psychotic features  ADL's:  Intact  Sleep: Poor  Appetite:  Fair  Suicidal Ideation:  Patient endorses strong suicidal ideation without intent.  Patient contracts for safety at this time.  Homicidal Ideation:  Denied  AEB (as evidenced by): Behavior on the unit.   Psychiatric Specialty Exam: Review of Systems  Constitutional: Negative.   HENT: Negative.   Eyes: Negative.   Respiratory: Negative.   Cardiovascular: Negative.   Gastrointestinal: Negative.   Genitourinary: Negative.   Musculoskeletal: Negative.   Skin: Negative.   Neurological: Negative.   Endo/Heme/Allergies: Negative.   Psychiatric/Behavioral: Positive for depression and suicidal ideas. The patient is nervous/anxious.     Blood pressure 132/88, pulse 101, temperature 98.4 F (36.9 C), temperature source Oral, resp. rate 20, height 5' (1.524 m), weight 53.978 kg (119 lb), last menstrual period 08/10/2013, SpO2 100.00%.Body mass index is 23.24 kg/(m^2).  General Appearance: Fairly Groomed and Guarded  Patent attorney::  Fair  Speech:  Clear and Coherent  Volume:  Normal  Mood:  "depressed, upset, frustrated, alone"  Affect:  labile  Thought Process:  Coherent and Goal Directed  Orientation:  Full (Time, Place, and Person)  Thought Content:  Obsessions and Rumination  Suicidal Thoughts:  Yes, is able to contract for safety  Homicidal Thoughts:  No  Memory:  Immediate;   Fair   Judgement:  Impaired  Insight:  Lacking  Psychomotor Activity:  Restlessness  Concentration:  Fair  Recall:  Fair  Akathisia:  NA  Handed:  Right  AIMS (if indicated):     Assets:  Communication Skills Desire for Improvement Resilience Social Support Transportation  Sleep:  Number of Hours: 5   Current Medications: Current Facility-Administered Medications  Medication Dose Route Frequency Provider Last Rate Last Dose  . acetaminophen (TYLENOL) tablet 650 mg  650 mg Oral Q6H PRN Nelly Rout, MD   650 mg at 08/17/13 2236  . alum & mag hydroxide-simeth (MAALOX/MYLANTA) 200-200-20 MG/5ML suspension 30 mL  30 mL Oral Q4H PRN Nelly Rout, MD   30 mL at 08/20/13 2220  . clindamycin (CLEOCIN) capsule 300 mg  300 mg Oral Q8H Vassie Loll, MD   300 mg at 08/21/13 1509  . clonazePAM (KLONOPIN) tablet 1 mg  1 mg Oral BID PRN Nehemiah Settle, MD   1 mg at 08/19/13 1833  . dexamethasone (DECADRON) tablet 4 mg  4 mg Oral Q8H Nehemiah Settle, MD      . divalproex (DEPAKOTE ER) 24 hr tablet 750 mg  750 mg Oral QHS Nehemiah Settle, MD   750 mg at 08/20/13 2102  . HYDROcodone-acetaminophen (NORCO) 10-325 MG per tablet 1 tablet  1 tablet Oral Q4H PRN Verne Spurr, PA-C   1 tablet at 08/21/13 1512  . hydrOXYzine (ATARAX/VISTARIL) tablet 25 mg  25 mg Oral TID PRN Nelly Rout, MD   25 mg at  08/19/13 2356  . ibuprofen (ADVIL,MOTRIN) tablet 600 mg  600 mg Oral Q6H PRN Vassie Loll, MD   600 mg at 08/19/13 2355  . magnesium hydroxide (MILK OF MAGNESIA) suspension 30 mL  30 mL Oral Daily PRN Nelly Rout, MD      . nicotine (NICODERM CQ - dosed in mg/24 hours) patch 14 mg  14 mg Transdermal Daily Nehemiah Settle, MD   14 mg at 08/19/13 0815  . QUEtiapine (SEROQUEL) tablet 200 mg  200 mg Oral QHS Nehemiah Settle, MD   200 mg at 08/20/13 2102  . traMADol (ULTRAM) tablet 100 mg  100 mg Oral Q6H PRN Nehemiah Settle, MD   100 mg at 08/20/13 1444   . traZODone (DESYREL) tablet 50 mg  50 mg Oral QHS Verne Spurr, PA-C   50 mg at 08/20/13 2102    Lab Results:  No results found for this or any previous visit (from the past 48 hour(s)).  Physical Findings: AIMS: Facial and Oral Movements Muscles of Facial Expression: None, normal Lips and Perioral Area: None, normal Jaw: None, normal Tongue: None, normal,Extremity Movements Upper (arms, wrists, hands, fingers): None, normal Lower (legs, knees, ankles, toes): None, normal, Trunk Movements Neck, shoulders, hips: None, normal, Overall Severity Severity of abnormal movements (highest score from questions above): None, normal Incapacitation due to abnormal movements: None, normal Patient's awareness of abnormal movements (rate only patient's report): No Awareness, Dental Status Current problems with teeth and/or dentures?: Yes Does patient usually wear dentures?: No  CIWA:  CIWA-Ar Total: 4 COWS:  COWS Total Score: 2  Treatment Plan Summary: Daily contact with patient to assess and evaluate symptoms and progress in treatment Medication management  Plan: 1. Continue crisis management and stabilization. 2. Medication management to reduce current symptoms to base line and improve the patient's overall level of functioning. 3. Treat health problems as indicated. 4. Develop treatment plan to decrease risk of relapse upon discharge and to reduce the need for readmission. 5. Psycho-social education regarding relapse prevention and self care. 6. Health care follow up as needed for medical problems. 7. disposition plans are in progress and length of stay 3 days 8. Continue Klonopin 1 mg BID for anxiety.  9. Continue Depakote 750 mg at bedtime for improved mood stability.  10. Continue  Seroquel 200 mg BID.  11. Appreciate dental consult and arrange for her to go to her am scheduled appointment.  12. Will start medications as prescribed by dental office.  13. ELOS: 1-2 days will  continue current plan of care as noted. Would increase Depakote level to 1000 at hs. 14. Recommend VPA level prior to discharge, will order for in the morning.  Medical Decision Making Problem Points:  Established problem, worsening (2), New problem, with additional work-up planned (4), Review of last therapy session (1) and Review of psycho-social stressors (1) Data Points:  Review or order clinical lab tests (1) Review or order medicine tests (1) Review of medication regiment & side effects (2) Review of new medications or change in dosage (2)  I certify that inpatient services furnished can reasonably be expected to improve the patient's condition.   Rona Ravens. [place] RPAC 11:34 PM 08/21/2013  Reviewed the information documented and agree with the treatment plan.  Maryana Pittmon,JANARDHAHA R. 08/22/2013 9:56 PM

## 2013-08-21 NOTE — [facility] Group Notes (Signed)
[facility] LCSW Aftercare Discharge Planning Group Note   08/21/2013 10:36 AM  Participation Quality:  Patient was at the dentist during group.  [name], [name]

## 2013-08-21 NOTE — Progress Notes (Signed)
Writer observed patient up in the dayroom watching tv. Writer spoke with patient 1:1 and she reports that overall she has had a good day but her tooth pain is what has been bothering her the most. Patient is aware that she will be leaving in the morning for her dental surgery and she is NPO after midnight. Patient is agreeable with medications scheduled at 2200. Patient c/o tooth pain and requested pain med which she received. Will monitor effectiveness of medication. Patient currently denies si/hi/a/v hallucinations. Safety maintained on unit, will continue to monitor.

## 2013-08-21 NOTE — Progress Notes (Signed)
Adult Psychoeducational Group Note  Date:  08/21/2013 Time:  9:59 PM  Group Topic/Focus:  Wrap-Up Group:   The focus of this group is to help patients review their daily goal of treatment and discuss progress on daily workbooks.  Participation Level:  Active  Participation Quality:  Appropriate  Affect:  Appropriate  Cognitive:  Appropriate  Insight: Appropriate  Engagement in Group:  Developing/Improving  Modes of Intervention:  Discussion  Additional Comments:  Pt stated that one good thing that happened today was that she was able to tlk to her boyfriend today, and that she had been worried about him because she was unable to get in contact with him but that he was out of town and that was why. Pt was very active in group and at times would get excited and cut others off as they tried to share.   [name] 08/21/2013, 9:59 PM

## 2013-08-21 NOTE — [facility] Group Notes (Signed)
[facility] LCSW Group Therapy  Emotional Regulation 1:15 - 2: 30 PM        08/21/2013  12:43 PM   Type of Therapy:  Group Therapy  Participation Level:  Appropriate  Participation Quality:  Appropriate  Affect:  Appropriate  Cognitive:  Attentive Appropriate  Insight:  Engaged  Engagement in Therapy:  Engaged  Modes of Intervention:  Discussion Exploration Problem-Solving Supportive  Summary of Progress/Problems:  Group topic was emotional regulations.  Patient participated in the discussion and was able to identify an emotion that needed to regulated.  She advised she has to learn to control anger.  Patient state she tends to stuff her anger and fears she will one day explode. Patient was able to identify approprite coping skills.  [name] 08/21/2013 12:43 PM

## 2013-08-21 NOTE — Progress Notes (Signed)
D: Patient denies HI and A/V hallucinations and reports SI; patient reports sleep is fair; reports appetite is poor ; reports energy level is hyper ; reports ability to pay attention is poor; rates depression as 10/10; rates hopelessness 10/10  A: Monitored q 15 minutes; patient encouraged to attend groups; patient educated about medications; patient given medications per physician orders; patient encouraged to express feelings and/or concerns  R: Patient is animated and talkative on the unit; patient is appropriate and no outburst today;  patient's interaction with staff and peers is appropriate; patient was able to set goal to talk with staff 1:1 when having feelings of SI; patient is taking medications as prescribed and tolerating medications; patient is attending some groups

## 2013-08-21 NOTE — Progress Notes (Signed)
Adult Psychoeducational Group Note  Date:  08/21/2013 Time:  10:00 11:00 Group Topic/Focus:  Therapeutic Activity and Personal  Development Groups  Participation Level:  Active  Participation Quality:  Appropriate and Attentive  Affect:  Appropriate  Cognitive:  Alert and Appropriate  Insight: Appropriate  Engagement in Group:  Engaged  Modes of Intervention:  Discussion and Education  Additional Comments: Pt attended and participated in group. Pt was asked what was her favorite meal? Pt stated lasagna and garlic bread. Pt was asked what was her trigger and she states when she talks to people that think they know everything.  Shelly Bombard D 08/21/2013, 1:05 PM

## 2013-08-22 LAB — VALPROIC ACID LEVEL: Valproic Acid Lvl: 54.4 ug/mL (ref 50.0–100.0)

## 2013-08-22 MED ORDER — TRAZODONE HCL 50 MG PO TABS: ORAL_TABLET | ORAL | Status: DC

## 2013-08-22 MED ORDER — CLONAZEPAM 1 MG PO TABS: 1.0000 mg | ORAL_TABLET | Freq: Two times a day (BID) | ORAL | Status: DC | PRN

## 2013-08-22 MED ORDER — DILTIAZEM HCL ER COATED BEADS 180 MG PO CP24: 180.0000 mg | ORAL_CAPSULE | Freq: Every day | ORAL | Status: DC

## 2013-08-22 MED ORDER — ACYCLOVIR 200 MG PO CAPS: 200.0000 mg | ORAL_CAPSULE | Freq: Every day | ORAL | Status: DC

## 2013-08-22 MED ORDER — DIVALPROEX SODIUM ER 500 MG PO TB24: 1000.0000 mg | ORAL_TABLET | Freq: Every day | ORAL | Status: DC

## 2013-08-22 MED ORDER — QUETIAPINE FUMARATE 200 MG PO TABS: 200.0000 mg | ORAL_TABLET | Freq: Every day | ORAL | Status: DC

## 2013-08-22 NOTE — [facility] Suicide Risk Assessment (Signed)
Suicide Risk Assessment  Discharge Assessment     Demographic Factors:  Adolescent or young adult, Low socioeconomic status, Living alone and Unemployed  Mental Status Per Nursing Assessment::   On Admission:  Suicidal ideation indicated by patient  Current Mental Status by Physician: Patient is calm and cooperative. she has been in good mood and appropriate affect. she has normal speech and thought process. she has no suicidal ideation or homicidal ideations, intention or plan. she has no evidence of psychosis.   Loss Factors: Financial problems/change in socioeconomic status  Historical Factors: NA  Risk Reduction Factors:   Sense of responsibility to family, Religious beliefs about death, Living with another person, especially a relative, Positive social support, Positive therapeutic relationship and Positive coping skills or problem solving skills  Continued Clinical Symptoms:  Severe Anxiety and/or Agitation Bipolar Disorder:   Mixed State Previous Psychiatric Diagnoses and Treatments Medical Diagnoses and Treatments/Surgeries  Cognitive Features That Contribute To Risk:  Polarized thinking    Suicide Risk:  Minimal: No identifiable suicidal ideation.  Patients presenting with no risk factors but with morbid ruminations; may be classified as minimal risk based on the severity of the depressive symptoms  Discharge Diagnoses:   AXIS I:  Bipolar, mixed and Generalized Anxiety Disorder AXIS II:  Deferred AXIS III:   Past Medical History  Diagnosis Date  . Ectopic pregnancy   . Urinary tract infection   . Ovarian cyst   . Hypertension     was told, but never on meds  . Dysrhythmia     hx of a-fib  . Infection     UTI  . Depression     ok now  . Back pain, chronic    AXIS IV:  other psychosocial or environmental problems, problems related to social environment and problems with primary support group AXIS V:  51-60 moderate symptoms  Plan Of Care/Follow-up  recommendations:  Activity:  as tolerated Diet:  Regular  Is patient on multiple antipsychotic therapies at discharge:  No   Has Patient had three or more failed trials of antipsychotic monotherapy by history:  No  Recommended Plan for Multiple Antipsychotic Therapies: NA  Naveen Clardy,JANARDHAHA R. 08/22/2013, 10:10 AM

## 2013-08-22 NOTE — Progress Notes (Signed)
Pt discharged per MD orders; pt currently denies SI/HI and auditory/visual hallucinations; pt was given education by RN regarding follow-up appointments and medications and pt denied any questions or concerns about these instructions; pt was then escorted to search room to retrieve her belongings by RN before being discharged to hospital lobby. 

## 2013-08-22 NOTE — Progress Notes (Signed)
Recreation Therapy Notes  Date: 09.18.2014 Time: 2:45pm Location: [address] Dayroom  Group Topic: Animal Assisted Activities (AAA)  Behavioral Response: Did not attend. Per patient consent patient declined all AAA services during admission.   Marykay Lex Odena Mcquaid, LRT/CTRS  Jearl Klinefelter 08/22/2013 4:28 PM

## 2013-08-22 NOTE — Progress Notes (Signed)
Patient ID: [name], female   DOB: [birth year], 32 y.o.   MRN: [id number] D: Patient in dayroom on approach. Pt seemed depressed and anxious.  Pt denies SI/HI/AVH. Pt attended evening wrap up group and Interacted appropriately with peers. Pt denies any needs or concerns.  Cooperative with assessment. No acute distressed noted at this time.   A: Met with pt 1:1. Medications administered as prescribed. Pt instructed on post tooth extraction care. Pt given saline to rinse mouth before bed. Writer encouraged pt to discuss feelings. Pt encouraged to come to staff with any question or concerns. 15 minutes checks for safety.  R: Patient remains safe. She is complaint with medications and denies any adverse reaction. Continue current POC.

## 2013-08-22 NOTE — Discharge Summary (Signed)
Physician Discharge Summary Note  Patient:  [name] is an 32 y.o., female MRN:  [id number] DOB:  [birth year] Patient phone:  [phone] (home)  Patient address:   [address] [place] [address],   Date of Admission:  08/13/2013 Date of Discharge: 08/22/2013   Reason for Admission:  MDD  Discharge Diagnoses: Active Problems:   * No active hospital problems. *  ROS  DSM5:  Schizophrenia Disorders:  Obsessive-Compulsive Disorders:  Trauma-Stressor Disorders:  Substance/Addictive Disorders:  Depressive Disorders: Major Depressive Disorder - Severe (296.23)  AXIS I: Bipolar disorder most recent episode mixed  AXIS II: Deferred  AXIS III:  Past Medical History   Diagnosis  Date   .  Ectopic pregnancy    .  Urinary tract infection    .  Ovarian cyst    .  Hypertension      was told, but never on meds   .  Dysrhythmia      hx of a-fib   .  Infection      UTI   .  Depression      ok now   .  Back pain, chronic     AXIS IV: economic problems, educational problems, housing problems, occupational problems, problems with access to health care services and problems with primary support group  AXIS V: 41-50 serious symptoms  Level of Care:  OP  Hospital Course: Clarisse Gouge was admitted from the ED where she arrived while visiting a friend in the hospital. She began having chest pain and shortness of breath. While being evaluated in the ED her boyfriend texted her urging her to tell the nurse about the feelings of depression she has been having. She was evaluated and after serious causes of her chest pain had been ruled out it was recommended that Clarisse Gouge be admitted for acute psychiatric hospitalization and crisis management.      Upon arrival at the unit Clarisse Gouge was evaluated and her symptoms were identified. She was encouraged to participate in unit programming and medication management was initiated. During the course of her admission Clarisse Gouge complained of  severe dental pain and was evaluated by the dentist on call. Due to the severity of her infection and abscess antibiotics and steroids were also initiated. Clarisse Gouge was able to have her tooth extracted as well.        She did participate in the unit programming and responded well to medication management, she reported feeling significantly better after the tooth was extracted.  Clarisse Gouge is a bright intelligent woman who sufferes from Bipolar disorder and her most recent episode was mixed. Her emotions were labile but were easily redirected with compassion and supportive care. Her life is difficult as she lives with her mother who abuses alcohol and is not very supportive. Clarisse Gouge is capable of working but getting a job is difficult as she does not drive. Her extended family is not supportive either, and she does allow pride to stand in her way.      By the day of discharge Clarisse Gouge was in much improved condition than upon arrival. She was able to report a significant reduction in symptoms or a complete resolution. She denied SI/HI and had no AVH. She felt that her admission was beneficial and was grateful to the staff at [facility] for the care she received. She was discharged home with samples of her medication, and a prescription for a month's supply. Clarisse Gouge was motivated to follow up as noted below.  Consults:  Dental  Significant Diagnostic Studies:  radiology: dental films  Discharge Vitals:   Blood pressure 147/93, pulse 96, temperature 97.7 F (36.5 C), temperature source Oral, resp. rate 20, height 5' (1.524 m), weight 53.978 kg (119 lb), last menstrual period 08/10/2013, SpO2 100.00%. Body mass index is 23.24 kg/(m^2). Lab Results:   Results for orders placed during the hospital encounter of 08/13/13 (from the past 72 hour(s))  VALPROIC ACID LEVEL     Status: None   Collection Time    08/22/13  6:20 AM      Result Value Range   Valproic Acid Lvl 54.4  50.0 - 100.0 ug/mL   Comment: Performed at  [facility]    Physical Findings: AIMS: Facial and Oral Movements Muscles of Facial Expression: None, normal Lips and Perioral Area: None, normal Jaw: None, normal Tongue: None, normal,Extremity Movements Upper (arms, wrists, hands, fingers): None, normal Lower (legs, knees, ankles, toes): None, normal, Trunk Movements Neck, shoulders, hips: None, normal, Overall Severity Severity of abnormal movements (highest score from questions above): None, normal Incapacitation due to abnormal movements: None, normal Patient's awareness of abnormal movements (rate only patient's report): No Awareness, Dental Status Current problems with teeth and/or dentures?: Yes Does patient usually wear dentures?: No  CIWA:  CIWA-Ar Total: 4 COWS:  COWS Total Score: 2  Psychiatric Specialty Exam: See Psychiatric Specialty Exam and Suicide Risk Assessment completed by Attending Physician prior to discharge.  Discharge destination:  Home  Is patient on multiple antipsychotic therapies at discharge:  No   Has Patient had three or more failed trials of antipsychotic monotherapy by history:  No  Recommended Plan for Multiple Antipsychotic Therapies: NA  Discharge Orders   Future Orders Complete By Expires   Diet - low sodium heart healthy  As directed    Discharge instructions  As directed    Comments:     Take all of your medications as directed. Be sure to keep all of your follow up appointments.  If you are unable to keep your follow up appointment, call your Doctor's office to let them know, and reschedule.  Make sure that you have enough medication to last until your appointment. Be sure to get plenty of rest. Going to bed at the same time each night will help. Try to avoid sleeping during the day.  Increase your activity as tolerated. Regular exercise will help you to sleep better and improve your mental health. Eating a heart healthy diet is recommended. Try to avoid salty or fried foods. Be  sure to avoid all alcohol and illegal drugs.   Increase activity slowly  As directed        Medication List    STOP taking these medications       acetaminophen 500 MG tablet  Commonly known as:  TYLENOL     diphenhydrAMINE 25 MG tablet  Commonly known as:  BENADRYL     polyethylene glycol packet  Commonly known as:  MIRALAX / GLYCOLAX      TAKE these medications     Indication   acyclovir 200 MG capsule  Commonly known as:  ZOVIRAX  Take 1 capsule (200 mg total) by mouth 5 (five) times daily. For HSV   Indication:  Shingles     clonazePAM 1 MG tablet  Commonly known as:  KLONOPIN  Take 1 tablet (1 mg total) by mouth 2 (two) times daily as needed (Anxiety or agitation).   Indication:  Panic Disorder     diltiazem 180 MG  24 hr capsule  Commonly known as:  CARDIZEM CD  Take 1 capsule (180 mg total) by mouth daily. For hypertension.   Indication:  High Blood Pressure     divalproex 500 MG 24 hr tablet  Commonly known as:  DEPAKOTE ER  Take 2 tablets (1,000 mg total) by mouth at bedtime. For mood stabilization.   Indication:  Manic Phase of Manic-Depression     QUEtiapine 200 MG tablet  Commonly known as:  SEROQUEL  Take 1 tablet (200 mg total) by mouth at bedtime. For mood stabilization and depression.   Indication:  Depressive Phase of Manic-Depression, Trouble Sleeping, Manic Phase of Manic-Depression     traZODone 50 MG tablet  Commonly known as:  DESYREL  Take one tablet at bedtime if needed for insomnia.   Indication:  Trouble Sleeping           Follow-up Information   Follow up with [facility] On 08/28/2013. (You are scheduled with Elroy Channel on Wednesday, August 28, 2013 at 11 AM)    Contact information:   [address] [place], [place]   [address]   [phone]      Follow up with [facility] On 08/23/2013. (Please go to [facility] on Friday, Septemer 19, 2014 or any weekday between 8AM - 3 PM for medication  management.)    Contact information:   [address] [place], [place]   [address]   [phone]      Follow-up recommendations:   Activities: Resume activity as tolerated. Diet: Heart healthy low sodium diet Tests: Follow up testing will be determined by your out patient provider. Comments:   Total Discharge Time:  Greater than 30 minutes.  Signed: Rona Ravens. Mashburn RPAC 10:47 AM 08/22/2013  Patient was personally evaluated, completed suicide risk assessment, case discussed with treatment team and physician extender. Treatment plan developed. Reviewed the information documented and agree with the treatment plan.  Caffie Sotto,JANARDHAHA R. 08/28/2013 8:03 PM

## 2013-08-22 NOTE — Progress Notes (Signed)
[facility] Adult Case Management Discharge Plan :  Will you be returning to the same living situation after discharge: Yes,  Patient will return to her home. At discharge, do you have transportation home?:Yes,  Patient assisted with a bus pass. Do you have the ability to pay for your medications:No. Patient needs assistance with indigent medications   Release of information consent forms completed and in the chart;  Patient's signature needed at discharge.  Patient to Follow up at: Follow-up Information   Follow up with [facility] On 08/28/2013. (You are scheduled with El[facility] Channel on Wednesday, August 28, 2013 at 11 AM)    Contact information:   [address] [place], [place]   [address]   [phone]      Follow up with [facility] On 08/23/2013. (Please go to [facility] on Friday, Septemer 19, 2014 or any weekday between 8AM - 3 PM for medication management.)    Contact information:   [address] [place], [place]   [address]   [phone]      Patient denies SI/HI:   Patient will no longer endorse SI/other thought self harm  Safety Planning and Suicide Prevention discussed:  Yes,  Reviewed with all patients during aftercare group.  Wynn Banker 08/22/2013, 12:09 PM

## 2013-08-22 NOTE — [facility] Group Notes (Signed)
[facility] LCSW Group Therapy  Living A Balanced Life  1:15 - 2: 30       08/22/2013  3:19 PM    Type of Therapy:  Group Therapy  Participation Level:  Appropriate  Participation Quality:  Appropriate  Affect:  Appropriate  Cognitive:  Attentive Appropriate  Insight:  Engaged  Engagement in Therapy:  Engaged  Modes of Intervention:  Discussion Exploration Problem-Solving Supportive.   Summary of Progress/Problems: Patient listened attentively but did not engage in discussion.  Wynn Banker 08/22/2013  3:19 PM \

## 2013-08-25 ENCOUNTER — Emergency Department (HOSPITAL_COMMUNITY)
Admission: EM | Admit: 2013-08-25 | Discharge: 2013-08-25 | Disposition: A | Discharge status: Home / Self Care | Payer: Self-pay | Attending: Emergency Medicine | Admitting: Emergency Medicine

## 2013-08-25 ENCOUNTER — Encounter (HOSPITAL_COMMUNITY): Payer: Self-pay | Admitting: Emergency Medicine

## 2013-08-25 DIAGNOSIS — R109 Unspecified abdominal pain: Secondary | ICD-10-CM | POA: Insufficient documentation

## 2013-08-25 DIAGNOSIS — Z79899 Other long term (current) drug therapy: Secondary | ICD-10-CM | POA: Insufficient documentation

## 2013-08-25 DIAGNOSIS — Z8619 Personal history of other infectious and parasitic diseases: Secondary | ICD-10-CM | POA: Insufficient documentation

## 2013-08-25 DIAGNOSIS — E860 Dehydration: Secondary | ICD-10-CM | POA: Insufficient documentation

## 2013-08-25 DIAGNOSIS — Z87440 Personal history of urinary (tract) infections: Secondary | ICD-10-CM | POA: Insufficient documentation

## 2013-08-25 DIAGNOSIS — Z3202 Encounter for pregnancy test, result negative: Secondary | ICD-10-CM | POA: Insufficient documentation

## 2013-08-25 DIAGNOSIS — F17200 Nicotine dependence, unspecified, uncomplicated: Secondary | ICD-10-CM | POA: Insufficient documentation

## 2013-08-25 DIAGNOSIS — F3289 Other specified depressive episodes: Secondary | ICD-10-CM | POA: Insufficient documentation

## 2013-08-25 DIAGNOSIS — F329 Major depressive disorder, single episode, unspecified: Secondary | ICD-10-CM | POA: Insufficient documentation

## 2013-08-25 DIAGNOSIS — Z8742 Personal history of other diseases of the female genital tract: Secondary | ICD-10-CM | POA: Insufficient documentation

## 2013-08-25 DIAGNOSIS — I10 Essential (primary) hypertension: Secondary | ICD-10-CM | POA: Insufficient documentation

## 2013-08-25 DIAGNOSIS — G8929 Other chronic pain: Secondary | ICD-10-CM | POA: Insufficient documentation

## 2013-08-25 DIAGNOSIS — R000 Tachycardia, unspecified: Secondary | ICD-10-CM | POA: Insufficient documentation

## 2013-08-25 LAB — COMPREHENSIVE METABOLIC PANEL
ALT: 43 U/L — ABNORMAL HIGH (ref 0–35)
AST: 34 U/L (ref 0–37)
AST: 24 U/L (ref 0–37)
Albumin: 4 g/dL (ref 3.5–5.2)
Albumin: 4.2 g/dL (ref 3.5–5.2)
Calcium: 9.4 mg/dL (ref 8.4–10.5)
Chloride: 97 mEq/L (ref 96–112)
Chloride: 99 mEq/L (ref 96–112)
Creatinine, Ser: 0.35 mg/dL — ABNORMAL LOW (ref 0.50–1.10)
Creatinine, Ser: 0.44 mg/dL — ABNORMAL LOW (ref 0.50–1.10)
Sodium: 135 mEq/L (ref 135–145)
Sodium: 139 mEq/L (ref 135–145)
Total Bilirubin: 0.4 mg/dL (ref 0.3–1.2)

## 2013-08-25 LAB — CBC WITH DIFFERENTIAL/PLATELET
Basophils Absolute: 0.1 10*3/uL (ref 0.0–0.1)
Basophils Relative: 0 % (ref 0–1)
MCHC: 35.3 g/dL (ref 30.0–36.0)
Monocytes Absolute: 1.3 10*3/uL — ABNORMAL HIGH (ref 0.1–1.0)
Neutro Abs: 10.3 10*3/uL — ABNORMAL HIGH (ref 1.7–7.7)
Neutrophils Relative %: 68 % (ref 43–77)
Platelets: 357 10*3/uL (ref 150–400)
RDW: 13.5 % (ref 11.5–15.5)

## 2013-08-25 LAB — URINALYSIS, ROUTINE W REFLEX MICROSCOPIC
Glucose, UA: NEGATIVE mg/dL
Ketones, ur: 15 mg/dL — AB
Leukocytes, UA: NEGATIVE
pH: 7.5 (ref 5.0–8.0)

## 2013-08-25 LAB — RAPID URINE DRUG SCREEN, HOSP PERFORMED
Benzodiazepines: NOT DETECTED
Cocaine: NOT DETECTED

## 2013-08-25 LAB — ETHANOL: Alcohol, Ethyl (B): <11 mg/dL (ref 0–11)

## 2013-08-25 LAB — POCT PREGNANCY, URINE: Preg Test, Ur: NEGATIVE

## 2013-08-25 MED ORDER — SODIUM CHLORIDE 0.9 % IV BOLUS (SEPSIS)
1000.0000 mL | Freq: Once | INTRAVENOUS | Status: AC
  Administered 2013-08-25: 1000 mL via INTRAVENOUS

## 2013-08-25 MED ORDER — ONDANSETRON 8 MG PO TBDP
8.0000 mg | ORAL_TABLET | Freq: Once | ORAL | Status: AC
  Administered 2013-08-25: 8 mg via ORAL
  Filled 2013-08-25: qty 1

## 2013-08-25 MED ORDER — METOCLOPRAMIDE HCL 5 MG/ML IJ SOLN
10.0000 mg | Freq: Once | INTRAMUSCULAR | Status: AC
  Administered 2013-08-25: 10 mg via INTRAVENOUS
  Filled 2013-08-25: qty 2

## 2013-08-25 NOTE — ED Notes (Signed)
Per pt, started drinking yesterday, beer and liquor-started vomiting this am

## 2013-08-25 NOTE — ED Provider Notes (Signed)
CSN: [id number]     Arrival date & time 08/25/13  0741 History   First MD Initiated Contact with Patient 08/25/13 [phone]     Chief Complaint  Patient presents with  . Emesis   (Consider location/radiation/quality/duration/timing/severity/associated sxs/prior Treatment) The history is provided by the patient. No language interpreter was used.  [name] is a 32 y/o F with PMhx of HTN, depression, chronic back pain presenting to the ED with emesis, nausea, and abdominal pain that started suddenly in the morning. Patient reported that the abdominal pain is localized to her entire abdomen, described as a painful squeezing sensation. Patient reported that the pain stays in the stomach, without radiation, reported that the pain gets worse with gagging and nothing makes the pain better. Patient reported that she has not used anything for the discomfort. Patient reported that she had at least 10-15 episodes of emesis this morning, reported that she was drinking alcohol last night. Reported that she drank 10 beers and had shots of liquor, patient is unable to recall how much liquor shots she took. Denied urinary symptoms, dysuria, changes to bowel movements, diarrhea, melena, hematochezia, chest pain, shortness of breath, difficulty breathing, weakness, headache, dizziness. PCP none    Past Medical History  Diagnosis Date  . Ectopic pregnancy   . Urinary tract infection   . Ovarian cyst   . Hypertension     was told, but never on meds  . Dysrhythmia     hx of a-fib  . Infection     UTI  . Depression     ok now  . Back pain, chronic    Past Surgical History  Procedure Laterality Date  . Etopical surgery    . Cleft palate repair    . Laparoscopy for ectopic pregnancy  2011   Family History  Problem Relation Age of Onset  . Other Neg Hx   . Hearing loss Neg Hx    History  Substance Use Topics  . Smoking status: Current Every Day Smoker -- 0.25 packs/day for 15 years    Types:  Cigarettes  . Smokeless tobacco: Never Used  . Alcohol Use: Yes     Comment: OCCASIONAL   OB History   Grav Para Term Preterm Abortions TAB SAB Ect Mult Living   2    2 0 1 1 0 0     Review of Systems  Constitutional: Negative for fever and chills.  HENT: Negative for trouble swallowing and neck pain.   Eyes: Negative for visual disturbance.  Respiratory: Negative for chest tightness and shortness of breath.   Cardiovascular: Negative for chest pain.  Gastrointestinal: Positive for nausea, vomiting and abdominal pain. Negative for diarrhea, constipation, blood in stool and anal bleeding.  Genitourinary: Negative for decreased urine volume and difficulty urinating.  Musculoskeletal: Negative for back pain.  Neurological: Negative for dizziness, weakness and headaches.  All other systems reviewed and are negative.    Allergies  Review of patient's allergies indicates no known allergies.  Home Medications   Current Outpatient Rx  Name  Route  Sig  Dispense  Refill  . clonazePAM (KLONOPIN) 1 MG tablet   Oral   Take 1 tablet (1 mg total) by mouth 2 (two) times daily as needed (Anxiety or agitation).   30 tablet   0   . divalproex (DEPAKOTE ER) 500 MG 24 hr tablet   Oral   Take 2 tablets (1,000 mg total) by mouth at bedtime. For mood stabilization.  60 tablet   0   . QUEtiapine (SEROQUEL) 200 MG tablet   Oral   Take 1 tablet (200 mg total) by mouth at bedtime. For mood stabilization and depression.   30 tablet   0   . traZODone (DESYREL) 50 MG tablet      Take one tablet at bedtime if needed for insomnia.   30 tablet   0    BP 140/104  Pulse 129  Temp(Src) 97.6 F (36.4 C) (Oral)  Resp 16  SpO2 99%  LMP 08/10/2013 Physical Exam  Nursing note and vitals reviewed. Constitutional: She is oriented to person, place, and time. She appears well-developed and well-nourished. No distress.  HENT:  Head: Normocephalic and atraumatic.  Mucus membranes dry  Eyes:  Conjunctivae and EOM are normal. Pupils are equal, round, and reactive to light. Right eye exhibits no discharge. Left eye exhibits no discharge.  Neck: Normal range of motion. Neck supple.  Cardiovascular: Regular rhythm and normal heart sounds.  Exam reveals no friction rub.   No murmur heard. Tachycardia noted  Pulmonary/Chest: Effort normal and breath sounds normal. No respiratory distress. She has no wheezes. She has no rales.  Abdominal: Soft. Bowel sounds are normal. She exhibits no distension. There is no tenderness. There is no rebound.  When palpating the abdomen patient fell asleep  Musculoskeletal: Normal range of motion.  Lymphadenopathy:    She has no cervical adenopathy.  Neurological: She is alert and oriented to person, place, and time. She exhibits normal muscle tone. Coordination normal.  Skin: Skin is warm and dry. No rash noted. She is not diaphoretic. No erythema.  Psychiatric: She has a normal mood and affect. Her behavior is normal. Thought content normal.    ED Course  Procedures (including critical care time)  Upon evaluation patient was tossing around and laying in a fetal position grabbing her abdomen, yelling that her stomach was hurting her. Upon physical exam - patient began to fall asleep.   12:40 PM Patient re-assessed. Patient found laying in bed sleeping. Patient woken up by this provider. Patient cheerful and interactive. Patient denied nausea and reported that her abdominal pain has improved, denied pain. Reported that she feels a lot better. Reported that when she drank all she ate yesterday was a hamburger. BS normoactive in all 4 quadrants, negative pain upon palpation to the abdomen. Patient requesting to eat.   Labs Review Labs Reviewed  CBC WITH DIFFERENTIAL - Abnormal; Notable for the following:    WBC 15.2 (*)    Hemoglobin 15.5 (*)    Neutro Abs 10.3 (*)    Monocytes Absolute 1.3 (*)    All other components within normal limits   COMPREHENSIVE METABOLIC PANEL - Abnormal; Notable for the following:    Glucose, Bld 155 (*)    Creatinine, Ser 0.35 (*)    All other components within normal limits  URINALYSIS, ROUTINE W REFLEX MICROSCOPIC - Abnormal; Notable for the following:    Ketones, ur 15 (*)    All other components within normal limits  COMPREHENSIVE METABOLIC PANEL - Abnormal; Notable for the following:    Creatinine, Ser 0.44 (*)    ALT 43 (*)    All other components within normal limits  LIPASE, BLOOD  ETHANOL  URINE RAPID DRUG SCREEN (HOSP PERFORMED)  POCT PREGNANCY, URINE   Imaging Review No results found.  MDM   1. Dehydration     Patient presenting to emergency department with abdominal pain, emesis, nausea that started  abruptly this morning after drinking 10 beers and having shots of liquor, patient unable to identify exactly how many shots she had. Patient denied fever, chills, chest pain, shortness of breath, difficulty breathing. Initially patient was laying in bed in the fetal position, grabbing her stomach and reporting discomfort. Alert and oriented. Lungs good auscultation bilaterally. Bowel sounds normoactive in all 4 quadrants. Negative pain upon palpation to the abdomen-while this provider was palpating the patient's stomach the patient fell asleep. Dry mucous members identified. Negative acute abdomen or peritoneal signs identified.  Urine negative for infection. Urine drug screen negative. Urine pregnancy. Ethanol level negative elevation. CBC mild elevation count to be 15.2. First draw a CMP became hemolyzed due to patient being so dehydrated, second draw CMP negative findings. Lipase negative elevation. When patient reassessed, reported that she feels better. Reported that nausea and abdominal pain and improved. Patient requesting to eat. Patient able to tolerate fluids and food by mouth. Negative episodes of emesis while in ED setting. Suspicion to be dehydration secondary to alcohol  use. Doubt acute abdominal process. Nausea and abdominal discomfort controlled in ED setting. Patient properly hydrated. Patient able to tolerate foods and liquids by mouth. Patient stable, afebrile. Discharged patient. Discussed with patient to rest and stay hydrated. Discussed with patient that she should not be drinking that much alcohol, especially with the medications that she takes at home. Discussed with patient to monitor symptoms and if symptoms are to worsen or change to report back to the ED - strict return instructions given. Patient agreed to plan of care, understood, all questions answered.    Raymon Mutton, PA-C 08/25/13 1749  Raymon Mutton, PA-C 08/25/13 1751

## 2013-08-27 NOTE — Progress Notes (Signed)
Patient Discharge Instructions:  After Visit Summary (AVS):   Faxed to:  08/27/13 Psychiatric Admission Assessment Note:   Faxed to:  08/27/13 Suicide Risk Assessment - Discharge Assessment:   Faxed to:  08/27/13 Faxed/Sent to the Next Level Care provider:  08/27/13 Faxed to [facility] @ [phone] Faxed to [facility] @ [phone] Jerelene Redden, 08/27/2013, 2:02 PM

## 2013-08-27 NOTE — ED Provider Notes (Signed)
Medical screening examination/treatment/procedure(s) were performed by non-physician practitioner and as supervising physician I was immediately available for consultation/collaboration.  Derwood Kaplan, MD 08/27/13 1515

## 2014-01-27 ENCOUNTER — Encounter (HOSPITAL_COMMUNITY): Payer: Self-pay | Admitting: Emergency Medicine

## 2014-01-27 ENCOUNTER — Emergency Department (HOSPITAL_COMMUNITY)
Admission: EM | Admit: 2014-01-27 | Discharge: 2014-01-27 | Disposition: A | Discharge status: Home / Self Care | Payer: Self-pay | Attending: Emergency Medicine | Admitting: Emergency Medicine

## 2014-01-27 DIAGNOSIS — W010XXA Fall on same level from slipping, tripping and stumbling without subsequent striking against object, initial encounter: Secondary | ICD-10-CM | POA: Insufficient documentation

## 2014-01-27 DIAGNOSIS — R0602 Shortness of breath: Secondary | ICD-10-CM | POA: Insufficient documentation

## 2014-01-27 DIAGNOSIS — Y929 Unspecified place or not applicable: Secondary | ICD-10-CM | POA: Insufficient documentation

## 2014-01-27 DIAGNOSIS — Z87440 Personal history of urinary (tract) infections: Secondary | ICD-10-CM | POA: Insufficient documentation

## 2014-01-27 DIAGNOSIS — G8929 Other chronic pain: Secondary | ICD-10-CM | POA: Insufficient documentation

## 2014-01-27 DIAGNOSIS — F17200 Nicotine dependence, unspecified, uncomplicated: Secondary | ICD-10-CM | POA: Insufficient documentation

## 2014-01-27 DIAGNOSIS — W19XXXA Unspecified fall, initial encounter: Secondary | ICD-10-CM

## 2014-01-27 DIAGNOSIS — Z79899 Other long term (current) drug therapy: Secondary | ICD-10-CM | POA: Insufficient documentation

## 2014-01-27 DIAGNOSIS — IMO0002 Reserved for concepts with insufficient information to code with codable children: Secondary | ICD-10-CM | POA: Insufficient documentation

## 2014-01-27 DIAGNOSIS — I10 Essential (primary) hypertension: Secondary | ICD-10-CM | POA: Insufficient documentation

## 2014-01-27 DIAGNOSIS — F3289 Other specified depressive episodes: Secondary | ICD-10-CM | POA: Insufficient documentation

## 2014-01-27 DIAGNOSIS — Z8742 Personal history of other diseases of the female genital tract: Secondary | ICD-10-CM | POA: Insufficient documentation

## 2014-01-27 DIAGNOSIS — Y939 Activity, unspecified: Secondary | ICD-10-CM | POA: Insufficient documentation

## 2014-01-27 DIAGNOSIS — S0990XA Unspecified injury of head, initial encounter: Secondary | ICD-10-CM | POA: Insufficient documentation

## 2014-01-27 DIAGNOSIS — F329 Major depressive disorder, single episode, unspecified: Secondary | ICD-10-CM | POA: Insufficient documentation

## 2014-01-27 MED ORDER — KETOROLAC TROMETHAMINE 60 MG/2ML IM SOLN
60.0000 mg | Freq: Once | INTRAMUSCULAR | Status: AC
  Administered 2014-01-27: 60 mg via INTRAMUSCULAR
  Filled 2014-01-27: qty 2

## 2014-01-27 MED ORDER — DIAZEPAM 5 MG PO TABS: 5.0000 mg | ORAL_TABLET | Freq: Two times a day (BID) | ORAL | Status: DC

## 2014-01-27 MED ORDER — HYDROCODONE-ACETAMINOPHEN 5-325 MG PO TABS: 2.0000 | ORAL_TABLET | ORAL | Status: DC | PRN

## 2014-01-27 MED ORDER — DIAZEPAM 5 MG PO TABS
5.0000 mg | ORAL_TABLET | Freq: Once | ORAL | Status: AC
  Administered 2014-01-27: 5 mg via ORAL
  Filled 2014-01-27: qty 1

## 2014-01-27 NOTE — ED Provider Notes (Signed)
CSN: [id number]     Arrival date & time 01/27/14  1248 History  This chart was scribed for non-physician practitioner, Clyde Canterbury, working with Richardean Canal, MD by Smiley Houseman, ED Scribe. This patient was seen in room WTR6/WTR6 and the patient's care was started at 2:20 PM.  Chief Complaint  Patient presents with  . Back Pain   The history is provided by the patient. No language interpreter was used.   HPI Comments: [name] is a 33 y.o. female with a h/o HTN who presents to the Emergency Department complaining of moderate lower back pain onset 1 year ago.  Pt states she was walking and slid on ice landing on her buttock.  She states after her fall the pain has worsened.  She reports the pain is sharp primarily on her right side.  She states in the morning she has to roll out of bed, due to the pain.  She reports the pain is worse at night.  Pt denies any other pain.  Pt reports she hasn't been taking her HTN medication, because the medication is too expensive.  She states she becomes SOB, especially when she walks.    Past Medical History  Diagnosis Date  . Ectopic pregnancy   . Urinary tract infection   . Ovarian cyst   . Hypertension     was told, but never on meds  . Dysrhythmia     hx of a-fib  . Infection     UTI  . Depression     ok now  . Back pain, chronic    Past Surgical History  Procedure Laterality Date  . Etopical surgery    . Cleft palate repair    . Laparoscopy for ectopic pregnancy  2011   Family History  Problem Relation Age of Onset  . Other Neg Hx   . Hearing loss Neg Hx    History  Substance Use Topics  . Smoking status: Current Every Day Smoker -- 0.25 packs/day for 15 years    Types: Cigarettes  . Smokeless tobacco: Never Used  . Alcohol Use: Yes     Comment: OCCASIONAL   OB History   Grav Para Term Preterm Abortions TAB SAB Ect Mult Living   2    2 0 1 1 0 0     Review of Systems  Constitutional: Negative for fever and chills.   Respiratory: Positive for shortness of breath (when ambulating). Negative for cough.   Cardiovascular: Negative for chest pain.  Gastrointestinal: Negative for nausea, vomiting, abdominal pain and diarrhea.  Musculoskeletal: Positive for back pain.  Skin: Negative for color change and rash.  Neurological: Positive for headaches. Negative for syncope.  Psychiatric/Behavioral: Negative for behavioral problems and confusion.  All other systems reviewed and are negative.   Allergies  Review of patient's allergies indicates no known allergies.  Home Medications   Current Outpatient Rx  Name  Route  Sig  Dispense  Refill  . acetaminophen (TYLENOL) 325 MG tablet   Oral   Take 650 mg by mouth every 6 (six) hours as needed (pain).         . clonazePAM (KLONOPIN) 1 MG tablet   Oral   Take 1 tablet (1 mg total) by mouth 2 (two) times daily as needed (Anxiety or agitation).   30 tablet   0   . divalproex (DEPAKOTE ER) 500 MG 24 hr tablet   Oral   Take 2 tablets (1,000 mg total) by mouth  at bedtime. For mood stabilization.   60 tablet   0   . QUEtiapine (SEROQUEL) 200 MG tablet   Oral   Take 1 tablet (200 mg total) by mouth at bedtime. For mood stabilization and depression.   30 tablet   0   . traZODone (DESYREL) 50 MG tablet      Take one tablet at bedtime if needed for insomnia.   30 tablet   0    Triage Vitals: BP 149/93  Pulse 99  Temp(Src) 98.2 F (36.8 C) (Oral)  Resp 16  SpO2 98%  LMP 01/19/2014  Physical Exam  Nursing note and vitals reviewed. Constitutional: She is oriented to person, place, and time. She appears well-developed and well-nourished. No distress.  HENT:  Head: Normocephalic and atraumatic.  Eyes: Conjunctivae and EOM are normal. Right eye exhibits no discharge. Left eye exhibits no discharge.  Neck: Neck supple. No tracheal deviation present.  Cardiovascular: Normal rate.   Pulmonary/Chest: Effort normal. No respiratory distress.   Abdominal: Soft. She exhibits no distension.  Musculoskeletal: Normal range of motion.  Lumbar perivertebral tenderness right side.  Good ROM, strength, and coordination.  No gait disturbances.  No focal weakness.  No numbness or tingling.  Neurological: She is alert and oriented to person, place, and time.  Skin: Skin is warm and dry. No rash noted.  Psychiatric: She has a normal mood and affect. Her behavior is normal. Judgment and thought content normal.    ED Course  Procedures (including critical care time) DIAGNOSTIC STUDIES: Oxygen Saturation is 98% on RA, normal by my interpretation.    COORDINATION OF CARE: 2:28 PM-Will order Valium and Toradol injection.  Patient informed of current plan of treatment and evaluation and agrees with plan.     MDM   Final diagnoses:  Fall    Pt feeling improved after toradol injection and valium PO given here in ER. No midline spinal tenderness, tingling or numbness. Good strength and coordination in extremities. Prescriptions for valium and hydrocodone given. Discussed plan of care with pt and she agrees.   I personally performed the services described in this documentation, which was scribed in my presence. The recorded information has been reviewed and is accurate.      Irish EldersKelly Kyrielle Urbanski, NP 01/30/14 1434

## 2014-01-27 NOTE — Progress Notes (Signed)
[facility] did not get to see patient but will be sending information about the [facility] Orange Card program, using the address provided.  °

## 2014-01-27 NOTE — Discharge Instructions (Signed)
Muscle Strain A muscle strain is an injury that occurs when a muscle is stretched beyond its normal length. Usually a small number of muscle fibers are torn when this happens. Muscle strain is rated in degrees. First-degree strains have the least amount of muscle fiber tearing and pain. Second-degree and third-degree strains have increasingly more tearing and pain.  Usually, recovery from muscle strain takes 1 2 weeks. Complete healing takes 5 6 weeks.  CAUSES  Muscle strain happens when a sudden, violent force placed on a muscle stretches it too far. This may occur with lifting, sports, or a fall.  RISK FACTORS Muscle strain is especially common in athletes.  SIGNS AND SYMPTOMS At the site of the muscle strain, there may be:  Pain.  Bruising.  Swelling.  Difficulty using the muscle due to pain or lack of normal function. DIAGNOSIS  Your health care provider will perform a physical exam and ask about your medical history. TREATMENT  Often, the best treatment for a muscle strain is resting, icing, and applying cold compresses to the injured area.  HOME CARE INSTRUCTIONS   Use the PRICE method of treatment to promote muscle healing during the first 2 3 days after your injury. The PRICE method involves:  Protecting the muscle from being injured again.  Restricting your activity and resting the injured body part.  Icing your injury. To do this, put ice in a plastic bag. Place a towel between your skin and the bag. Then, apply the ice and leave it on from 15 20 minutes each hour. After the third day, switch to moist heat packs.  Apply compression to the injured area with a splint or elastic bandage. Be careful not to wrap it too tightly. This may interfere with blood circulation or increase swelling.  Elevate the injured body part above the level of your heart as often as you can.  Only take over-the-counter or prescription medicines for pain, discomfort, or fever as directed by your  health care provider.  Warming up prior to exercise helps to prevent future muscle strains. SEEK MEDICAL CARE IF:   You have increasing pain or swelling in the injured area.  You have numbness, tingling, or a significant loss of strength in the injured area. MAKE SURE YOU:   Understand these instructions.  Will watch your condition.  Will get help right away if you are not doing well or get worse. Document Released: 11/21/2005 Document Revised: 09/11/2013 Document Reviewed: 06/20/2013 [facility] Patient Information 2014 [place], [place].   Take valium at night  Ibuprofen for pain or discomfort Norco for mod-severe pain

## 2014-01-27 NOTE — ED Notes (Signed)
Pt c/o mid and lower back pain x 1 year.  Reports that she fell on Friday and it is now hurting worse.  Pain score 9/10.

## 2014-01-30 NOTE — ED Provider Notes (Signed)
Medical screening examination/treatment/procedure(s) were performed by non-physician practitioner and as supervising physician I was immediately available for consultation/collaboration.  EKG Interpretation   None         Richardean Canalavid H Yao, MD 01/30/14 1452

## 2014-02-28 ENCOUNTER — Emergency Department (HOSPITAL_COMMUNITY)
Admission: EM | Admit: 2014-02-28 | Discharge: 2014-02-28 | Disposition: A | Discharge status: Home / Self Care | Payer: Self-pay | Attending: Emergency Medicine | Admitting: Emergency Medicine

## 2014-02-28 ENCOUNTER — Encounter (HOSPITAL_COMMUNITY): Payer: Self-pay | Admitting: Emergency Medicine

## 2014-02-28 DIAGNOSIS — F17200 Nicotine dependence, unspecified, uncomplicated: Secondary | ICD-10-CM | POA: Insufficient documentation

## 2014-02-28 DIAGNOSIS — M7710 Lateral epicondylitis, unspecified elbow: Secondary | ICD-10-CM | POA: Insufficient documentation

## 2014-02-28 DIAGNOSIS — Z87440 Personal history of urinary (tract) infections: Secondary | ICD-10-CM | POA: Insufficient documentation

## 2014-02-28 DIAGNOSIS — I10 Essential (primary) hypertension: Secondary | ICD-10-CM | POA: Insufficient documentation

## 2014-02-28 DIAGNOSIS — Z8742 Personal history of other diseases of the female genital tract: Secondary | ICD-10-CM | POA: Insufficient documentation

## 2014-02-28 DIAGNOSIS — M7711 Lateral epicondylitis, right elbow: Secondary | ICD-10-CM

## 2014-02-28 DIAGNOSIS — G8929 Other chronic pain: Secondary | ICD-10-CM | POA: Insufficient documentation

## 2014-02-28 DIAGNOSIS — F3289 Other specified depressive episodes: Secondary | ICD-10-CM | POA: Insufficient documentation

## 2014-02-28 DIAGNOSIS — F329 Major depressive disorder, single episode, unspecified: Secondary | ICD-10-CM | POA: Insufficient documentation

## 2014-02-28 MED ORDER — HYDROCODONE-ACETAMINOPHEN 5-325 MG PO TABS
1.0000 | ORAL_TABLET | Freq: Once | ORAL | Status: AC
Start: 2014-02-28 — End: 2014-02-28
  Administered 2014-02-28: 1 via ORAL
  Filled 2014-02-28: qty 1

## 2014-02-28 MED ORDER — NAPROXEN 500 MG PO TABS: 500.0000 mg | ORAL_TABLET | Freq: Two times a day (BID) | ORAL | Status: DC

## 2014-02-28 MED ORDER — TRAMADOL HCL 50 MG PO TABS: 50.0000 mg | ORAL_TABLET | Freq: Four times a day (QID) | ORAL | Status: DC | PRN

## 2014-02-28 MED ORDER — IBUPROFEN 800 MG PO TABS
800.0000 mg | ORAL_TABLET | Freq: Once | ORAL | Status: AC
  Administered 2014-02-28: 800 mg via ORAL
  Filled 2014-02-28: qty 1

## 2014-02-28 NOTE — Discharge Instructions (Signed)
You should take naproxen, an antiinflammatory twice daily for 10 days, then take twice daily as needed for pain. You may find an elbow band specific for "tennis elbow" lateral epicondylitis at any pharmacy. Just ask the pharmacist when you get to the store. Alternate heat and ice therapy as described in detail below.  It is important to perform gentle stretches daily to help prevent your elbow from becoming more stiff and painful. Follow up with primary care or orthopedist if symptoms persist. See further instruction below.

## 2014-02-28 NOTE — ED Provider Notes (Signed)
CSN: [id number]     Arrival date & time 02/28/14  0945 History   First MD Initiated Contact with Patient 02/28/14 1002     Chief Complaint  Patient presents with  . Elbow Pain     (Consider location/radiation/quality/duration/timing/severity/associated sxs/prior Treatment) HPI Pt is a 33yo female c/o sudden onset of right elbow pain that started 2 days ago. Pt states she woke up 2 days ago with her right elbow "very stiff" and painful to move. Pt states she normally sleeps on her right arm but the other morning she was unable to move it due to stiffness and pain.  Pt states "it hurts like right on bone."   Pain is constant, aching, 8/10, worse with movement and palpation. Pt states "if I push here [pointing to lateral epicondyle] it really hurts."  Pt states she is right hand dominant so it has been preventing her from doing her daily activities including house work.  She has tried ice and leftover naproxen w/o relief. Denies falls or trauma to right arm. Denies fever, nausea or vomiting. Denies warmth or redness to area. Denies previous injury to same elbow.   Past Medical History  Diagnosis Date  . Ectopic pregnancy   . Urinary tract infection   . Ovarian cyst   . Hypertension     was told, but never on meds  . Dysrhythmia     hx of a-fib  . Infection     UTI  . Depression     ok now  . Back pain, chronic    Past Surgical History  Procedure Laterality Date  . Etopical surgery    . Cleft palate repair    . Laparoscopy for ectopic pregnancy  2011   Family History  Problem Relation Age of Onset  . Other Neg Hx   . Hearing loss Neg Hx    History  Substance Use Topics  . Smoking status: Current Every Day Smoker -- 0.25 packs/day for 15 years    Types: Cigarettes  . Smokeless tobacco: Never Used  . Alcohol Use: Yes     Comment: OCCASIONAL   OB History   Grav Para Term Preterm Abortions TAB SAB Ect Mult Living   2    2 0 1 1 0 0     Review of Systems  Constitutional:  Negative for fever and chills.  Musculoskeletal: Positive for arthralgias and myalgias. Negative for back pain, joint swelling, neck pain and neck stiffness.       Right elbow  Skin: Negative for color change, rash and wound.  Neurological: Positive for weakness ( right elbow). Negative for numbness.  All other systems reviewed and are negative.      Allergies  Review of patient's allergies indicates no known allergies.  Home Medications   Current Outpatient Rx  Name  Route  Sig  Dispense  Refill  . divalproex (DEPAKOTE ER) 500 MG 24 hr tablet   Oral   Take 2 tablets (1,000 mg total) by mouth at bedtime. For mood stabilization.   60 tablet   0   . HYDROcodone-acetaminophen (NORCO/VICODIN) 5-325 MG per tablet   Oral   Take 2 tablets by mouth every 4 (four) hours as needed.   10 tablet   0   . naproxen sodium (ANAPROX) 220 MG tablet   Oral   Take 220 mg by mouth 2 (two) times daily with a meal.         . QUEtiapine (SEROQUEL) 200 MG tablet  Oral   Take 1 tablet (200 mg total) by mouth at bedtime. For mood stabilization and depression.   30 tablet   0   . naproxen (NAPROSYN) 500 MG tablet   Oral   Take 1 tablet (500 mg total) by mouth 2 (two) times daily with a meal. Take 1 tablet twice daily for 10 days. Then take twice daily as needed for pain.   30 tablet   0   . traMADol (ULTRAM) 50 MG tablet   Oral   Take 1 tablet (50 mg total) by mouth every 6 (six) hours as needed.   15 tablet   0    BP 136/94  Pulse 89  Temp(Src) 98.5 F (36.9 C) (Oral)  Resp 18  SpO2 100%  LMP 02/11/2014 Physical Exam  Nursing note and vitals reviewed. Constitutional: She is oriented to person, place, and time. She appears well-developed and well-nourished.  HENT:  Head: Normocephalic and atraumatic.  Eyes: EOM are normal.  Neck: Normal range of motion.  Cardiovascular: Normal rate.   Right arm: radial pulse 2+. Cap refill <2sec  Pulmonary/Chest: Effort normal.   Musculoskeletal: She exhibits tenderness. She exhibits no edema.  Right elbow: tenderness along lateral epicondyle. No edema or warmth.  Full flexion w/o difficulty. Limited elbow extension to 130 degrees. Increased pain with supination without resistance.  Grip strength 4/5 in right hand compared to left.  Neurological: She is alert and oriented to person, place, and time.  Sensation to light and sharp touch distal to elbow in tact.  Skin: Skin is warm and dry.  Skin in tact. No ecchymosis, erythema, or warmth. No red streaking, induration, or evidence of underlying infection.  Psychiatric: She has a normal mood and affect. Her behavior is normal.    ED Course  Procedures (including critical care time) Labs Review Labs Reviewed - No data to display Imaging Review No results found.   EKG Interpretation None      MDM   Final diagnoses:  Lateral epicondylitis of right elbow    pt c/o right elbow pain and stiffness after waking 2 days ago. Denies warmth or redness to area. Denies trauma to right arm. Pt is right hand dominant.  On exam, pt is tender along right lateral epicondyl and musculature. Decreased elbow extension due to pain and stiffness. Increased pain with supination. No evidence of underlying infection. Also not concerned for fracture or dislocation due to no reported trauma to arm.  Will tx as lateral epicondylitis. Discussed daily naproxen for 10 days, alternating ice and heat therapy for pain. Tramadol prescribed for breakthrough pain. Discussed use of elbow strap for epicondylitis that may be found at most pharmacies. Advised to f/u with [facility] or [facility]. Return precautions provided. Pt verbalized understanding and agreement with tx plan.     Junius Finner, PA-C 02/28/14 [phone]

## 2014-02-28 NOTE — Progress Notes (Signed)
[facility] provided pt with a list of primary care resources and a [facility] orange card application to help patient establish primary care.  °

## 2014-02-28 NOTE — ED Notes (Addendum)
Per pt, states right elbow pian for 2 days, can't do anything

## 2014-02-28 NOTE — ED Provider Notes (Signed)
Medical screening examination/treatment/procedure(s) were performed by non-physician practitioner and as supervising physician I was immediately available for consultation/collaboration.   EKG Interpretation None       Marliyah Reid, MD 02/28/14 1614 

## 2014-04-21 ENCOUNTER — Inpatient Hospital Stay (HOSPITAL_COMMUNITY)
Admission: AD | Admit: 2014-04-21 | Discharge: 2014-04-21 | Disposition: A | Discharge status: Home / Self Care | Payer: Self-pay | Source: Ambulatory Visit | Attending: Obstetrics & Gynecology | Admitting: Obstetrics & Gynecology

## 2014-04-21 ENCOUNTER — Encounter (HOSPITAL_COMMUNITY): Payer: Self-pay | Admitting: General Practice

## 2014-04-21 DIAGNOSIS — F17200 Nicotine dependence, unspecified, uncomplicated: Secondary | ICD-10-CM | POA: Insufficient documentation

## 2014-04-21 DIAGNOSIS — B373 Candidiasis of vulva and vagina: Secondary | ICD-10-CM

## 2014-04-21 DIAGNOSIS — N949 Unspecified condition associated with female genital organs and menstrual cycle: Secondary | ICD-10-CM | POA: Insufficient documentation

## 2014-04-21 DIAGNOSIS — A5901 Trichomonal vulvovaginitis: Secondary | ICD-10-CM | POA: Insufficient documentation

## 2014-04-21 DIAGNOSIS — B3731 Acute candidiasis of vulva and vagina: Secondary | ICD-10-CM | POA: Insufficient documentation

## 2014-04-21 DIAGNOSIS — N926 Irregular menstruation, unspecified: Secondary | ICD-10-CM | POA: Insufficient documentation

## 2014-04-21 LAB — URINALYSIS, ROUTINE W REFLEX MICROSCOPIC
Bilirubin Urine: NEGATIVE
GLUCOSE, UA: NEGATIVE mg/dL
HGB URINE DIPSTICK: NEGATIVE
Ketones, ur: NEGATIVE mg/dL
Leukocytes, UA: NEGATIVE
Nitrite: NEGATIVE
Protein, ur: NEGATIVE mg/dL
Specific Gravity, Urine: 1.02 (ref 1.005–1.030)
Urobilinogen, UA: 0.2 mg/dL (ref 0.0–1.0)
pH: 5.5 (ref 5.0–8.0)

## 2014-04-21 LAB — WET PREP, GENITAL
Clue Cells Wet Prep HPF POC: NONE SEEN
YEAST WET PREP: NONE SEEN

## 2014-04-21 LAB — HIV ANTIBODY (ROUTINE TESTING W REFLEX): HIV 1&2 Ab, 4th Generation: NONREACTIVE

## 2014-04-21 LAB — POCT PREGNANCY, URINE: Preg Test, Ur: NEGATIVE

## 2014-04-21 MED ORDER — METRONIDAZOLE 500 MG PO TABS
2000.0000 mg | ORAL_TABLET | Freq: Once | ORAL | Status: AC
  Administered 2014-04-21: 2000 mg via ORAL
  Filled 2014-04-21: qty 4

## 2014-04-21 MED ORDER — FLUCONAZOLE 150 MG PO TABS
150.0000 mg | ORAL_TABLET | Freq: Every day | ORAL | Status: DC
  Administered 2014-04-21: 150 mg via ORAL
  Filled 2014-04-21: qty 1

## 2014-04-21 MED ORDER — FLUCONAZOLE 150 MG PO TABS: 150.0000 mg | ORAL_TABLET | Freq: Every day | ORAL | Status: DC

## 2014-04-21 NOTE — MAU Provider Note (Signed)
Attestation of Attending Supervision of Advanced Practitioner (CNM/NP): Evaluation and management procedures were performed by the Advanced Practitioner under my supervision and collaboration. I have reviewed the Advanced Practitioner's note and chart, and I agree with the management and plan.  Lesly DukesKelly H Shanvi Moyd 5:05 PM

## 2014-04-21 NOTE — MAU Note (Signed)
Pt here today for vaginal itching and discomfort.  Pt doesn't think she could be pregnant.  No vaginal bleeding.  Pt not in any distress.

## 2014-04-21 NOTE — MAU Note (Signed)
Patient states she has had vaginal itching and discomfort since yesterday, denies pain or bleeding.

## 2014-04-21 NOTE — MAU Provider Note (Signed)
History     CSN: [id number]  Arrival date and time: 04/21/14 1104   First Provider Initiated Contact with Patient 04/21/14 1315      No chief complaint on file.  HPI  [name] is a 33 y.o. female G2P0020 who presents with vaginal irritation that started yesterday. The patient complains of severe vaginal itching; denies vaginal discharge or bleeding. She feels that her vagina is very dry. History of irregular menstrual cycles; last cycle was in March. Pt does not see a regular GYN dr.   Last intercourse was May 9; no pain with intercourse.   OB History   Grav Para Term Preterm Abortions TAB SAB Ect Mult Living   2    2 0 1 1 0 0      Past Medical History  Diagnosis Date  . Ectopic pregnancy   . Urinary tract infection   . Ovarian cyst   . Hypertension     was told, but never on meds  . Dysrhythmia     hx of a-fib  . Infection     UTI  . Depression     ok now  . Back pain, chronic     Past Surgical History  Procedure Laterality Date  . Etopical surgery    . Cleft palate repair    . Laparoscopy for ectopic pregnancy  2011    Family History  Problem Relation Age of Onset  . Other Neg Hx   . Hearing loss Neg Hx     History  Substance Use Topics  . Smoking status: Current Every Day Smoker -- 0.25 packs/day for 15 years    Types: Cigarettes  . Smokeless tobacco: Never Used  . Alcohol Use: Yes     Comment: OCCASIONAL    Allergies: No Known Allergies  Prescriptions prior to admission  Medication Sig Dispense Refill  . divalproex (DEPAKOTE ER) 500 MG 24 hr tablet Take 2 tablets (1,000 mg total) by mouth at bedtime. For mood stabilization.  60 tablet  0  . HYDROcodone-acetaminophen (NORCO/VICODIN) 5-325 MG per tablet Take 2 tablets by mouth every 4 (four) hours as needed.  10 tablet  0  . naproxen (NAPROSYN) 500 MG tablet Take 1 tablet (500 mg total) by mouth 2 (two) times daily with a meal. Take 1 tablet twice daily for 10 days. Then take twice  daily as needed for pain.  30 tablet  0  . naproxen sodium (ANAPROX) 220 MG tablet Take 220 mg by mouth 2 (two) times daily with a meal.      . QUEtiapine (SEROQUEL) 200 MG tablet Take 1 tablet (200 mg total) by mouth at bedtime. For mood stabilization and depression.  30 tablet  0  . traMADol (ULTRAM) 50 MG tablet Take 1 tablet (50 mg total) by mouth every 6 (six) hours as needed.  15 tablet  0   Results for orders placed during the hospital encounter of 04/21/14 (from the past 48 hour(s))  URINALYSIS, ROUTINE W REFLEX MICROSCOPIC     Status: None   Collection Time    04/21/14 11:40 AM      Result Value Ref Range   Color, Urine YELLOW  YELLOW   APPearance CLEAR  CLEAR   Specific Gravity, Urine 1.020  1.005 - 1.030   pH 5.5  5.0 - 8.0   Glucose, UA NEGATIVE  NEGATIVE mg/dL   Hgb urine dipstick NEGATIVE  NEGATIVE   Bilirubin Urine NEGATIVE  NEGATIVE   Ketones, ur  NEGATIVE  NEGATIVE mg/dL   Protein, ur NEGATIVE  NEGATIVE mg/dL   Urobilinogen, UA 0.2  0.0 - 1.0 mg/dL   Nitrite NEGATIVE  NEGATIVE   Leukocytes, UA NEGATIVE  NEGATIVE   Comment: MICROSCOPIC NOT DONE ON URINES WITH NEGATIVE PROTEIN, BLOOD, LEUKOCYTES, NITRITE, OR GLUCOSE <1000 mg/dL.  POCT PREGNANCY, URINE     Status: None   Collection Time    04/21/14 11:49 AM      Result Value Ref Range   Preg Test, Ur NEGATIVE  NEGATIVE   Comment:            THE SENSITIVITY OF THIS     METHODOLOGY IS >24 mIU/mL  WET PREP, GENITAL     Status: Abnormal   Collection Time    04/21/14  1:31 PM      Result Value Ref Range   Yeast Wet Prep HPF POC NONE SEEN  NONE SEEN   Trich, Wet Prep FEW (*) NONE SEEN   Clue Cells Wet Prep HPF POC NONE SEEN  NONE SEEN   WBC, Wet Prep HPF POC FEW (*) NONE SEEN   Comment: FEW BACTERIA SEEN    Review of Systems  Constitutional: Negative for fever and chills.  Gastrointestinal: Negative for abdominal pain.  Genitourinary: Negative for dysuria (No burning; "it just feels different when I urinate".),  urgency, frequency and hematuria.       No vaginal discharge. No vaginal bleeding. No dysuria.    Physical Exam   Blood pressure 132/89, pulse 88, temperature 98.4 F (36.9 C), temperature source Oral, resp. rate 16, height 5' (1.524 m), weight 64.864 kg (143 lb), last menstrual period 02/16/2014, SpO2 99.00%.  Physical Exam  Constitutional: She is oriented to person, place, and time. She appears well-developed and well-nourished. No distress.  HENT:  Head: Normocephalic.  Eyes: Pupils are equal, round, and reactive to light.  Neck: Neck supple.  Respiratory: Effort normal.  GI: Soft. She exhibits no distension. There is no tenderness.  Genitourinary: Vaginal discharge found.  Speculum exam: Vagina - Moderate amount of creamy, thick,  white discharge, no odor. Erythema noted at introitus  Cervix - No contact bleeding GC/Chlam, wet prep done Chaperone present for exam.   Musculoskeletal: Normal range of motion.  Neurological: She is alert and oriented to person, place, and time.  Skin: Skin is warm. She is not diaphoretic.  Psychiatric: Her behavior is normal.    MAU Course  Procedures None  MDM Wet prep GC UA Urine pregnancy  Diflucan 150 mg in MAU   Assessment and Plan   A:  1. Trichomonas vaginitis   2. Yeast vaginitis    P:  Discharge home in stable condition  Partner needs to be treated; please advise Condoms always Self hygiene discussed  RX: Diflucan   Iona HansenJennifer Irene Salimatou Simone, NP  04/21/2014, 2:42 PM

## 2014-04-22 LAB — GC/CHLAMYDIA PROBE AMP
CT PROBE, AMP APTIMA: NEGATIVE
GC PROBE AMP APTIMA: NEGATIVE

## 2014-05-02 ENCOUNTER — Emergency Department (HOSPITAL_COMMUNITY)
Admission: EM | Admit: 2014-05-02 | Discharge: 2014-05-02 | Disposition: A | Discharge status: Home / Self Care | Payer: Self-pay | Attending: Emergency Medicine | Admitting: Emergency Medicine

## 2014-05-02 ENCOUNTER — Encounter (HOSPITAL_COMMUNITY): Payer: Self-pay | Admitting: Emergency Medicine

## 2014-05-02 ENCOUNTER — Emergency Department (HOSPITAL_COMMUNITY): Payer: Self-pay

## 2014-05-02 DIAGNOSIS — Z8742 Personal history of other diseases of the female genital tract: Secondary | ICD-10-CM | POA: Insufficient documentation

## 2014-05-02 DIAGNOSIS — I10 Essential (primary) hypertension: Secondary | ICD-10-CM | POA: Insufficient documentation

## 2014-05-02 DIAGNOSIS — J069 Acute upper respiratory infection, unspecified: Secondary | ICD-10-CM | POA: Insufficient documentation

## 2014-05-02 DIAGNOSIS — G8929 Other chronic pain: Secondary | ICD-10-CM | POA: Insufficient documentation

## 2014-05-02 DIAGNOSIS — F17200 Nicotine dependence, unspecified, uncomplicated: Secondary | ICD-10-CM | POA: Insufficient documentation

## 2014-05-02 DIAGNOSIS — Z79899 Other long term (current) drug therapy: Secondary | ICD-10-CM | POA: Insufficient documentation

## 2014-05-02 DIAGNOSIS — Z8659 Personal history of other mental and behavioral disorders: Secondary | ICD-10-CM | POA: Insufficient documentation

## 2014-05-02 DIAGNOSIS — Z87440 Personal history of urinary (tract) infections: Secondary | ICD-10-CM | POA: Insufficient documentation

## 2014-05-02 DIAGNOSIS — K429 Umbilical hernia without obstruction or gangrene: Secondary | ICD-10-CM | POA: Insufficient documentation

## 2014-05-02 LAB — COMPREHENSIVE METABOLIC PANEL
ALBUMIN: 4.2 g/dL (ref 3.5–5.2)
ALT: 15 U/L (ref 0–35)
AST: 15 U/L (ref 0–37)
Alkaline Phosphatase: 50 U/L (ref 39–117)
BUN: 8 mg/dL (ref 6–23)
CO2: 20 mEq/L (ref 19–32)
Calcium: 9.4 mg/dL (ref 8.4–10.5)
Chloride: 102 mEq/L (ref 96–112)
Creatinine, Ser: 0.48 mg/dL — ABNORMAL LOW (ref 0.50–1.10)
GFR calc Af Amer: >90 mL/min (ref >90)
GFR calc non Af Amer: >90 mL/min (ref >90)
Glucose, Bld: 91 mg/dL (ref 70–99)
Potassium: 4 mEq/L (ref 3.7–5.3)
SODIUM: 138 meq/L (ref 137–147)
TOTAL PROTEIN: 7.1 g/dL (ref 6.0–8.3)
Total Bilirubin: 0.4 mg/dL (ref 0.3–1.2)

## 2014-05-02 LAB — CBC WITH DIFFERENTIAL/PLATELET
BASOS ABS: 0 10*3/uL (ref 0.0–0.1)
Basophils Relative: 0 % (ref 0–1)
EOS ABS: 0 10*3/uL (ref 0.0–0.7)
Eosinophils Relative: 1 % (ref 0–5)
HCT: 39.1 % (ref 36.0–46.0)
Hemoglobin: 13.3 g/dL (ref 12.0–15.0)
LYMPHS PCT: 29 % (ref 12–46)
Lymphs Abs: 2.2 10*3/uL (ref 0.7–4.0)
MCH: 31.3 pg (ref 26.0–34.0)
MCHC: 34 g/dL (ref 30.0–36.0)
MCV: 92 fL (ref 78.0–100.0)
Monocytes Absolute: 0.6 10*3/uL (ref 0.1–1.0)
Monocytes Relative: 8 % (ref 3–12)
Neutro Abs: 4.8 10*3/uL (ref 1.7–7.7)
Neutrophils Relative %: 62 % (ref 43–77)
PLATELETS: 295 10*3/uL (ref 150–400)
RBC: 4.25 MIL/uL (ref 3.87–5.11)
RDW: 14 % (ref 11.5–15.5)
WBC: 7.6 10*3/uL (ref 4.0–10.5)

## 2014-05-02 MED ORDER — FLUTICASONE PROPIONATE 50 MCG/ACT NA SUSP: 1.0000 | Freq: Every day | NASAL | Status: DC

## 2014-05-02 MED ORDER — LORATADINE 10 MG PO TABS: 10.0000 mg | ORAL_TABLET | Freq: Every day | ORAL | Status: DC

## 2014-05-02 NOTE — ED Notes (Signed)
Patient denies any abdominal pain now.  Is requesting something to eat and drink.  Reports her throat feels dry.

## 2014-05-02 NOTE — ED Provider Notes (Signed)
CSN: [id number]     Arrival date & time 05/02/14  1047 History   First MD Initiated Contact with Patient 05/02/14 1158     Chief Complaint  Patient presents with  . Sore Throat  . Hernia     (Consider location/radiation/quality/duration/timing/severity/associated sxs/prior Treatment) HPI [name] is a 33 y.o. female who presents emergency department complaining of sore throat, cough, abdominal pain. Patient states that she has had cold symptoms for one week. States she's having mild nasal congestion, sore cough, dry cough. Has taken over-the-counter medications with no relief. Patient also reports abdominal pain. States has history of periumbilical hernia, has had it for 8 years. States it is getting larger. States most recently has had pain in the area and states hernia gets larger when she coughs. At this time there is no pain unless patient is coughing or with palpation of her abdomen. She states she's not having any nausea or vomiting. No fevers. Last bowel movement was this morning, states had to strain. Patient states she has not seen a surgeon for her hernia in the past. Denies any other complaints.  Past Medical History  Diagnosis Date  . Ectopic pregnancy   . Urinary tract infection   . Ovarian cyst   . Hypertension     was told, but never on meds  . Dysrhythmia     hx of a-fib  . Infection     UTI  . Depression     ok now  . Back pain, chronic    Past Surgical History  Procedure Laterality Date  . Etopical surgery    . Cleft palate repair    . Laparoscopy for ectopic pregnancy  2011   Family History  Problem Relation Age of Onset  . Other Neg Hx   . Hearing loss Neg Hx    History  Substance Use Topics  . Smoking status: Current Every Day Smoker -- 0.25 packs/day for 15 years    Types: Cigarettes  . Smokeless tobacco: Never Used  . Alcohol Use: Yes     Comment: OCCASIONAL   OB History   Grav Para Term Preterm Abortions TAB SAB Ect Mult Living   2     2 0 1 1 0 0     Review of Systems  Constitutional: Negative for fever and chills.  HENT: Positive for congestion and sore throat. Negative for ear pain, trouble swallowing and voice change.   Respiratory: Negative for cough, chest tightness and shortness of breath.   Cardiovascular: Negative for chest pain, palpitations and leg swelling.  Gastrointestinal: Positive for abdominal pain. Negative for nausea, vomiting and diarrhea.  Genitourinary: Negative for dysuria, flank pain, vaginal bleeding, vaginal discharge, vaginal pain and pelvic pain.  Musculoskeletal: Negative for arthralgias, myalgias, neck pain and neck stiffness.  Skin: Negative for rash.  Neurological: Negative for dizziness, weakness and headaches.  All other systems reviewed and are negative.     Allergies  Review of patient's allergies indicates no known allergies.  Home Medications   Prior to Admission medications   Medication Sig Start Date End Date Taking? Authorizing Provider  diphenhydrAMINE (BENADRYL) 25 MG tablet Take 25 mg by mouth every 6 (six) hours as needed for allergies.   Yes Historical Provider, MD  ibuprofen (ADVIL,MOTRIN) 200 MG tablet Take 400 mg by mouth every 6 (six) hours as needed for moderate pain.   Yes Historical Provider, MD  divalproex (DEPAKOTE ER) 500 MG 24 hr tablet Take 2 tablets (1,000 mg total)  by mouth at bedtime. For mood stabilization. 08/22/13   Verne Spurr, PA-C   BP 136/86  Pulse 70  Temp(Src) 98 F (36.7 C) (Oral)  Resp 20  Ht 5' (1.524 m)  Wt 140 lb (63.504 kg)  BMI 27.34 kg/m2  SpO2 100%  LMP 02/16/2014 Physical Exam  Nursing note and vitals reviewed. Constitutional: She appears well-developed and well-nourished. No distress.  HENT:  Head: Normocephalic.  Right Ear: Tympanic membrane, external ear and ear canal normal.  Left Ear: Tympanic membrane, external ear and ear canal normal.  Nose: Nose normal.  Mouth/Throat: Uvula is midline and mucous membranes are  normal. Posterior oropharyngeal erythema present. No oropharyngeal exudate, posterior oropharyngeal edema or tonsillar abscesses.  Eyes: Conjunctivae are normal.  Neck: Neck supple.  Cardiovascular: Normal rate, regular rhythm and normal heart sounds.   Pulmonary/Chest: Effort normal and breath sounds normal. No respiratory distress. She has no wheezes. She has no rales.  Abdominal: Soft. Bowel sounds are normal. She exhibits no distension. There is tenderness. There is no rebound.  Periumbilical hernia noted, it is soft, tender, I am unable to reduce it.  Musculoskeletal: She exhibits no edema.  Neurological: She is alert.  Skin: Skin is warm and dry.  Psychiatric: She has a normal mood and affect. Her behavior is normal.    ED Course  Procedures (including critical care time) Labs Review Labs Reviewed  COMPREHENSIVE METABOLIC PANEL - Abnormal; Notable for the following:    Creatinine, Ser 0.48 (*)    All other components within normal limits  CBC WITH DIFFERENTIAL    Imaging Review Dg Abd Acute W/chest  05/02/2014   CLINICAL DATA:  Abdominal hernia.  Rule out obstruction.  EXAM: ACUTE ABDOMEN SERIES (ABDOMEN 2 VIEW & CHEST 1 VIEW)  COMPARISON:  08/13/2013  FINDINGS: Frontal view of the chest demonstrates midline trachea. Normal heart size. Right-sided aortic arch. No pleural effusion or pneumothorax. Clear lungs.  Abdominal films demonstrate no free intraperitoneal air or significant air-fluid levels on upright positioning. No bowel distention on supine imaging. Distal gas and stool. No abnormal abdominal calcifications. No appendicolith.  IMPRESSION: No acute findings.  Right-sided aortic arch.   Electronically Signed   By: Jeronimo Greaves M.D.   On: 05/02/2014 13:19     EKG Interpretation None      MDM   Final diagnoses:  Umbilical hernia  URI (upper respiratory infection)  \ is with the work  Pt with umbilical hernia, abdomen is soft, i was eventually able to push hernia in.  Pt is afebrile. No nausea/vomiting. VS normal. Will get labs, abd x-ray.    2:38 PM X-ray and labs all normal. Pt is in no distress. She ate a tray of food in ED, no nausea or vomiting. Normal bowel sounds. No hernia incarceration or SBO at this time. Home with surgery follow up.   Filed Vitals:   05/02/14 1125 05/02/14 1425  BP: 136/86 129/80  Pulse: 70 80  Temp: 98 F (36.7 C) 97.7 F (36.5 C)  TempSrc: Oral Oral  Resp: 20 20  Height: 5' (1.524 m)   Weight: 140 lb (63.504 kg)   SpO2: 100% 100%       Myriam Jacobson Ell Tiso, PA-C 05/02/14 1552

## 2014-05-02 NOTE — Progress Notes (Signed)
[facility] CL spoke with patient about [employer]. Patient PCP is [facility]. Patient stated that she did not go to pcp today because she did not have money for 20 dollar co-pay. Patient has upcoming apt with [facility] on 6/17 at 11:15am. CL offered  call and see about getting patient an earlier appointment, if possible. Patient declined offer, stating that she made the apt on 6/17 because she would have the money for co-pay.

## 2014-05-02 NOTE — ED Notes (Signed)
Pt c/o increasing sore throat and dry cough x 1 week and increasing umbilical hernia x 1 month.  Pain score 8/10.  Pt reports that "cough is causing hernia to enlarge."

## 2014-05-02 NOTE — Discharge Instructions (Signed)
Take claritin for allergy symptoms. Take flonase for congestion. Follow up with general surgery regarding hernia. Return if worsening.     Hernia A hernia occurs when an internal organ pushes out through a weak spot in the abdominal wall. Hernias most commonly occur in the groin and around the navel. Hernias often can be pushed back into place (reduced). Most hernias tend to get worse over time. Some abdominal hernias can get stuck in the opening (irreducible or incarcerated hernia) and cannot be reduced. An irreducible abdominal hernia which is tightly squeezed into the opening is at risk for impaired blood supply (strangulated hernia). A strangulated hernia is a medical emergency. Because of the risk for an irreducible or strangulated hernia, surgery may be recommended to repair a hernia. CAUSES   Heavy lifting.  Prolonged coughing.  Straining to have a bowel movement.  A cut (incision) made during an abdominal surgery. HOME CARE INSTRUCTIONS   Bed rest is not required. You may continue your normal activities.  Avoid lifting more than 10 pounds (4.5 kg) or straining.  Cough gently. If you are a smoker it is best to stop. Even the best hernia repair can break down with the continual strain of coughing. Even if you do not have your hernia repaired, a cough will continue to aggravate the problem.  Do not wear anything tight over your hernia. Do not try to keep it in with an outside bandage or truss. These can damage abdominal contents if they are trapped within the hernia sac.  Eat a normal diet.  Avoid constipation. Straining over long periods of time will increase hernia size and encourage breakdown of repairs. If you cannot do this with diet alone, stool softeners may be used. SEEK IMMEDIATE MEDICAL CARE IF:   You have a fever.  You develop increasing abdominal pain.  You feel nauseous or vomit.  Your hernia is stuck outside the abdomen, looks discolored, feels hard, or is  tender.  You have any changes in your bowel habits or in the hernia that are unusual for you.  You have increased pain or swelling around the hernia.  You cannot push the hernia back in place by applying gentle pressure while lying down. MAKE SURE YOU:   Understand these instructions.  Will watch your condition.  Will get help right away if you are not doing well or get worse. Document Released: 11/21/2005 Document Revised: 02/13/2012 Document Reviewed: 07/10/2008 [facility] Patient Information 2014 [place], [place].

## 2014-05-02 NOTE — ED Notes (Signed)
PA okayed patient to have lunch tray.

## 2014-05-03 NOTE — ED Provider Notes (Signed)
Medical screening examination/treatment/procedure(s) were performed by non-physician practitioner and as supervising physician I was immediately available for consultation/collaboration.   Janiylah Hannis T Sharna Gabrys, MD 05/03/14 1625 

## 2014-05-21 ENCOUNTER — Ambulatory Visit: Payer: Self-pay | Admitting: Internal Medicine

## 2014-06-15 DIAGNOSIS — R1031 Right lower quadrant pain: Secondary | ICD-10-CM | POA: Insufficient documentation

## 2014-06-15 DIAGNOSIS — R112 Nausea with vomiting, unspecified: Secondary | ICD-10-CM | POA: Insufficient documentation

## 2014-06-15 DIAGNOSIS — F17200 Nicotine dependence, unspecified, uncomplicated: Secondary | ICD-10-CM | POA: Insufficient documentation

## 2014-06-15 DIAGNOSIS — Z79899 Other long term (current) drug therapy: Secondary | ICD-10-CM | POA: Insufficient documentation

## 2014-06-15 DIAGNOSIS — I10 Essential (primary) hypertension: Secondary | ICD-10-CM | POA: Insufficient documentation

## 2014-06-15 DIAGNOSIS — Z8742 Personal history of other diseases of the female genital tract: Secondary | ICD-10-CM | POA: Insufficient documentation

## 2014-06-15 DIAGNOSIS — Z3202 Encounter for pregnancy test, result negative: Secondary | ICD-10-CM | POA: Insufficient documentation

## 2014-06-15 DIAGNOSIS — R1032 Left lower quadrant pain: Secondary | ICD-10-CM | POA: Insufficient documentation

## 2014-06-15 DIAGNOSIS — Z8659 Personal history of other mental and behavioral disorders: Secondary | ICD-10-CM | POA: Insufficient documentation

## 2014-06-15 DIAGNOSIS — Z87440 Personal history of urinary (tract) infections: Secondary | ICD-10-CM | POA: Insufficient documentation

## 2014-06-15 DIAGNOSIS — G8929 Other chronic pain: Secondary | ICD-10-CM | POA: Insufficient documentation

## 2014-06-15 DIAGNOSIS — R6883 Chills (without fever): Secondary | ICD-10-CM | POA: Insufficient documentation

## 2014-06-15 NOTE — ED Notes (Signed)
Per EMS pt presents d/t nausea and vomiting since 1900.  Pt given 4 mg IV zofran en route.  No c/o pain.  Denies possibility of pregnancy.

## 2014-06-16 ENCOUNTER — Encounter (HOSPITAL_COMMUNITY): Payer: Self-pay | Admitting: Emergency Medicine

## 2014-06-16 ENCOUNTER — Emergency Department (HOSPITAL_COMMUNITY)
Admission: EM | Admit: 2014-06-16 | Discharge: 2014-06-16 | Disposition: A | Discharge status: Home / Self Care | Payer: Self-pay | Attending: Emergency Medicine | Admitting: Emergency Medicine

## 2014-06-16 DIAGNOSIS — R110 Nausea: Secondary | ICD-10-CM

## 2014-06-16 LAB — COMPREHENSIVE METABOLIC PANEL
ALK PHOS: 56 U/L (ref 39–117)
ALT: 12 U/L (ref 0–35)
ANION GAP: 16 — AB (ref 5–15)
AST: 14 U/L (ref 0–37)
Albumin: 4.4 g/dL (ref 3.5–5.2)
BUN: 7 mg/dL (ref 6–23)
CHLORIDE: 105 meq/L (ref 96–112)
CO2: 22 meq/L (ref 19–32)
CREATININE: 0.56 mg/dL (ref 0.50–1.10)
Calcium: 10.2 mg/dL (ref 8.4–10.5)
GFR calc Af Amer: >90 mL/min (ref >90)
Glucose, Bld: 88 mg/dL (ref 70–99)
POTASSIUM: 4.1 meq/L (ref 3.7–5.3)
Sodium: 143 mEq/L (ref 137–147)
Total Bilirubin: 0.4 mg/dL (ref 0.3–1.2)
Total Protein: 8.1 g/dL (ref 6.0–8.3)

## 2014-06-16 LAB — CBC WITH DIFFERENTIAL/PLATELET
Basophils Absolute: 0 10*3/uL (ref 0.0–0.1)
Basophils Relative: 0 % (ref 0–1)
Eosinophils Absolute: 0 10*3/uL (ref 0.0–0.7)
Eosinophils Relative: 0 % (ref 0–5)
HEMATOCRIT: 40.6 % (ref 36.0–46.0)
HEMOGLOBIN: 14.1 g/dL (ref 12.0–15.0)
LYMPHS ABS: 1.9 10*3/uL (ref 0.7–4.0)
Lymphocytes Relative: 19 % (ref 12–46)
MCH: 31.5 pg (ref 26.0–34.0)
MCHC: 34.7 g/dL (ref 30.0–36.0)
MCV: 90.8 fL (ref 78.0–100.0)
MONO ABS: 0.7 10*3/uL (ref 0.1–1.0)
Monocytes Relative: 7 % (ref 3–12)
NEUTROS ABS: 7.4 10*3/uL (ref 1.7–7.7)
NEUTROS PCT: 74 % (ref 43–77)
Platelets: 284 10*3/uL (ref 150–400)
RBC: 4.47 MIL/uL (ref 3.87–5.11)
RDW: 13.8 % (ref 11.5–15.5)
WBC: 10 10*3/uL (ref 4.0–10.5)

## 2014-06-16 LAB — URINALYSIS, ROUTINE W REFLEX MICROSCOPIC
Bilirubin Urine: NEGATIVE
GLUCOSE, UA: NEGATIVE mg/dL
HGB URINE DIPSTICK: NEGATIVE
Ketones, ur: 40 mg/dL — AB
Leukocytes, UA: NEGATIVE
Nitrite: NEGATIVE
Protein, ur: NEGATIVE mg/dL
SPECIFIC GRAVITY, URINE: 1.02 (ref 1.005–1.030)
UROBILINOGEN UA: 0.2 mg/dL (ref 0.0–1.0)
pH: 8 (ref 5.0–8.0)

## 2014-06-16 LAB — LIPASE, BLOOD: LIPASE: 13 U/L (ref 11–59)

## 2014-06-16 LAB — VALPROIC ACID LEVEL: Valproic Acid Lvl: 79.4 ug/mL (ref 50.0–100.0)

## 2014-06-16 LAB — PREGNANCY, URINE: Preg Test, Ur: NEGATIVE

## 2014-06-16 MED ORDER — MORPHINE SULFATE 4 MG/ML IJ SOLN
4.0000 mg | Freq: Once | INTRAMUSCULAR | Status: AC
  Administered 2014-06-16: 4 mg via INTRAVENOUS
  Filled 2014-06-16: qty 1

## 2014-06-16 MED ORDER — SODIUM CHLORIDE 0.9 % IV BOLUS (SEPSIS)
1000.0000 mL | Freq: Once | INTRAVENOUS | Status: AC
  Administered 2014-06-16: 1000 mL via INTRAVENOUS

## 2014-06-16 MED ORDER — ONDANSETRON HCL 4 MG/2ML IJ SOLN
4.0000 mg | Freq: Once | INTRAMUSCULAR | Status: AC
  Administered 2014-06-16: 4 mg via INTRAVENOUS
  Filled 2014-06-16: qty 2

## 2014-06-16 MED ORDER — PROMETHAZINE HCL 25 MG PO TABS: 25.0000 mg | ORAL_TABLET | Freq: Three times a day (TID) | ORAL | Status: DC | PRN

## 2014-06-16 NOTE — ED Notes (Signed)
Pt reports nausea/vomiting since 1900 on 7/12.  Pt also reports 2 week hx of diarrhea, no recent antibiotic use.

## 2014-06-16 NOTE — Discharge Instructions (Signed)
Return here as needed.  Follow up with a primary care doctor °

## 2014-06-16 NOTE — ED Notes (Signed)
Bed: WA20 Expected date: 06/15/14 Expected time: 11:42 PM Means of arrival: Ambulance Comments: Nausea/vomiting

## 2014-06-16 NOTE — ED Provider Notes (Signed)
CSN: [id number]     Arrival date & time 06/15/14  2356 History   First MD Initiated Contact with Patient 06/16/14 0305     Chief Complaint  Patient presents with  . Nausea     (Consider location/radiation/quality/duration/timing/severity/associated sxs/prior Treatment) HPI Comments: Patient is a 33 year old female presents emergency room chief complaint of nausea and vomiting since 1900 today. The patient reports initial onset of nausea, reports Sticking her fingers down her throat, to help relieve the nausea. She reports epigastric discomfort. Denies fever, urinary symptoms, back pain, vaginal discharge.  She reports straining with bowel movements, last bowel movement today. Denies BRBPR, pus, loose stool.    The history is provided by the patient. No language interpreter was used.    Past Medical History  Diagnosis Date  . Ectopic pregnancy   . Urinary tract infection   . Ovarian cyst   . Hypertension     was told, but never on meds  . Dysrhythmia     hx of a-fib  . Infection     UTI  . Depression     ok now  . Back pain, chronic    Past Surgical History  Procedure Laterality Date  . Etopical surgery    . Cleft palate repair    . Laparoscopy for ectopic pregnancy  2011   Family History  Problem Relation Age of Onset  . Other Neg Hx   . Hearing loss Neg Hx    History  Substance Use Topics  . Smoking status: Current Every Day Smoker -- 0.25 packs/day for 15 years    Types: Cigarettes  . Smokeless tobacco: Never Used  . Alcohol Use: Yes     Comment: OCCASIONAL   OB History   Grav Para Term Preterm Abortions TAB SAB Ect Mult Living   2    2 0 1 1 0 0     Review of Systems  Constitutional: Positive for chills. Negative for fever.  Gastrointestinal: Positive for nausea, vomiting and abdominal pain. Negative for constipation and blood in stool.  Genitourinary: Negative for dysuria, urgency, hematuria and vaginal discharge.      Allergies  Review of patient's  allergies indicates no known allergies.  Home Medications   Prior to Admission medications   Medication Sig Start Date End Date Taking? Authorizing Provider  divalproex (DEPAKOTE ER) 500 MG 24 hr tablet Take 2 tablets (1,000 mg total) by mouth at bedtime. For mood stabilization. 08/22/13  Yes Neil Mashburn, PA-C   BP 130/91  Pulse 72  Temp(Src) 98.1 F (36.7 C) (Oral)  Resp 21  SpO2 99% Physical Exam  Nursing note and vitals reviewed. Constitutional: She is oriented to person, place, and time. She appears well-developed and well-nourished.  Non-toxic appearance. She does not have a sickly appearance. She does not appear ill. No distress.  HENT:  Head: Normocephalic and atraumatic.  Eyes: EOM are normal. Pupils are equal, round, and reactive to light.  Neck: Normal range of motion. Neck supple.  Cardiovascular: Normal rate and regular rhythm.   Pulmonary/Chest: Effort normal and breath sounds normal. No respiratory distress. She has no wheezes. She has no rales.  Abdominal: Soft. Bowel sounds are normal. She exhibits no distension. There is tenderness in the right lower quadrant, suprapubic area and left lower quadrant. There is no rebound and no guarding.  Musculoskeletal: Normal range of motion.  Neurological: She is alert and oriented to person, place, and time.  Skin: Skin is warm and dry. She is not  diaphoretic.  Psychiatric: She has a normal mood and affect. Her behavior is normal.    ED Course  Procedures (including critical care time) Labs Review Labs Reviewed  URINALYSIS, ROUTINE W REFLEX MICROSCOPIC - Abnormal; Notable for the following:    Ketones, ur 40 (*)    All other components within normal limits  COMPREHENSIVE METABOLIC PANEL - Abnormal; Notable for the following:    Anion gap 16 (*)    All other components within normal limits  PREGNANCY, URINE  CBC WITH DIFFERENTIAL  LIPASE, BLOOD  VALPROIC ACID LEVEL    Imaging Review No results found.   EKG  Interpretation None      MDM   Final diagnoses:  Nausea   Pt presents with nausea with self inflicted nausea. Minimal tenderness on exam, afebrile, denies urinary or pelvic complaints.  Plan to treat symptomatically and re-evaluate.  Reevaluation patient resting comfortably in room. She reports resolution of nausea, with mild resolution of abdominal pain upon further questioning the patient reports that she took 5 Depakote 500 mg 24 tablets today to try and get some rest. Will obtain valproic acid level. Normal mentation. Pt care assumed by Otila Kluver, PA-C at discharge, awaiting valproic acid level check and likely discharge home  Meds given in ED:  Medications  ondansetron (ZOFRAN) injection 4 mg (not administered)  sodium chloride 0.9 % bolus 1,000 mL (not administered)  morphine 4 MG/ML injection 4 mg (not administered)    New Prescriptions   No medications on file        Clabe Seal, PA-C 06/19/14 1244  Clabe Seal, PA-C 06/19/14 1245

## 2014-06-19 NOTE — ED Provider Notes (Signed)
Medical screening examination/treatment/procedure(s) were performed by non-physician practitioner and as supervising physician I was immediately available for consultation/collaboration.   EKG Interpretation None        Xander Jutras L Chela Sutphen, MD 06/19/14 2256 

## 2014-07-25 ENCOUNTER — Ambulatory Visit: Payer: Self-pay | Admitting: Internal Medicine

## 2014-07-26 ENCOUNTER — Encounter (HOSPITAL_COMMUNITY): Payer: Self-pay | Admitting: Emergency Medicine

## 2014-07-26 ENCOUNTER — Emergency Department (HOSPITAL_COMMUNITY)
Admission: EM | Admit: 2014-07-26 | Discharge: 2014-07-26 | Disposition: A | Discharge status: Home / Self Care | Payer: Self-pay | Attending: Emergency Medicine | Admitting: Emergency Medicine

## 2014-07-26 DIAGNOSIS — Z8742 Personal history of other diseases of the female genital tract: Secondary | ICD-10-CM | POA: Insufficient documentation

## 2014-07-26 DIAGNOSIS — Z79899 Other long term (current) drug therapy: Secondary | ICD-10-CM | POA: Insufficient documentation

## 2014-07-26 DIAGNOSIS — G8929 Other chronic pain: Secondary | ICD-10-CM | POA: Insufficient documentation

## 2014-07-26 DIAGNOSIS — B9789 Other viral agents as the cause of diseases classified elsewhere: Secondary | ICD-10-CM | POA: Insufficient documentation

## 2014-07-26 DIAGNOSIS — Z8659 Personal history of other mental and behavioral disorders: Secondary | ICD-10-CM | POA: Insufficient documentation

## 2014-07-26 DIAGNOSIS — F17200 Nicotine dependence, unspecified, uncomplicated: Secondary | ICD-10-CM | POA: Insufficient documentation

## 2014-07-26 DIAGNOSIS — B349 Viral infection, unspecified: Secondary | ICD-10-CM

## 2014-07-26 DIAGNOSIS — Z87440 Personal history of urinary (tract) infections: Secondary | ICD-10-CM | POA: Insufficient documentation

## 2014-07-26 DIAGNOSIS — I10 Essential (primary) hypertension: Secondary | ICD-10-CM | POA: Insufficient documentation

## 2014-07-26 DIAGNOSIS — R197 Diarrhea, unspecified: Secondary | ICD-10-CM | POA: Insufficient documentation

## 2014-07-26 DIAGNOSIS — R51 Headache: Secondary | ICD-10-CM | POA: Insufficient documentation

## 2014-07-26 LAB — RAPID STREP SCREEN (MED CTR MEBANE ONLY): Streptococcus, Group A Screen (Direct): NEGATIVE

## 2014-07-26 MED ORDER — GUAIFENESIN 100 MG/5ML PO LIQD: 100.0000 mg | ORAL | Status: DC | PRN

## 2014-07-26 MED ORDER — HYDROCODONE-ACETAMINOPHEN 7.5-325 MG/15ML PO SOLN: 10.0000 mL | Freq: Four times a day (QID) | ORAL | Status: DC | PRN

## 2014-07-26 MED ORDER — HYDROCODONE-ACETAMINOPHEN 7.5-325 MG/15ML PO SOLN
10.0000 mL | Freq: Once | ORAL | Status: AC
  Administered 2014-07-26: 10 mL via ORAL
  Filled 2014-07-26: qty 15

## 2014-07-26 NOTE — ED Provider Notes (Signed)
CSN: [id number]     Arrival date & time 07/26/14  1117 History  This chart was scribed for non-physician practitioner, Fayrene Helper, PA-C working with Toy Cookey, MD by Greggory Stallion, ED scribe. This patient was seen in room WTR8/WTR8 and the patient's care was started at 11:56 AM.   Chief Complaint  Patient presents with  . Headache  . Sore Throat  . Nasal Congestion   The history is provided by the patient. No language interpreter was used.   HPI Comments: [name] is a 33 y.o. female who presents to the Emergency Department complaining of intermittent frontal headache that started 2 weeks ago. States it has been constant since yesterday. Exertion and loud noises worsen the headache. Also reports nasal congestion, sore throat, sneezing, generalized body aches and cough that started last night. States she has had a few episodes of diarrhea. Pt has taken Claritin and used Flonase with no relief. Denies fever, nausea, emesis, rash. Denies sick contacts. Pt has started to cut back on smoking cigarettes.   Past Medical History  Diagnosis Date  . Ectopic pregnancy   . Urinary tract infection   . Ovarian cyst   . Hypertension     was told, but never on meds  . Dysrhythmia     hx of a-fib  . Infection     UTI  . Depression     ok now  . Back pain, chronic    Past Surgical History  Procedure Laterality Date  . Etopical surgery    . Cleft palate repair    . Laparoscopy for ectopic pregnancy  2011   Family History  Problem Relation Age of Onset  . Other Neg Hx   . Hearing loss Neg Hx    History  Substance Use Topics  . Smoking status: Current Every Day Smoker -- 0.25 packs/day for 15 years    Types: Cigarettes  . Smokeless tobacco: Never Used  . Alcohol Use: Yes     Comment: OCCASIONAL   OB History   Grav Para Term Preterm Abortions TAB SAB Ect Mult Living   2    2 0 1 1 0 0     Review of Systems  Constitutional: Negative for fever.  HENT: Positive for  congestion, sneezing and sore throat.   Respiratory: Positive for cough.   Gastrointestinal: Positive for diarrhea. Negative for nausea and vomiting.  Musculoskeletal: Positive for myalgias.  Skin: Negative for rash.  Neurological: Positive for headaches.  All other systems reviewed and are negative.  Allergies  Review of patient's allergies indicates no known allergies.  Home Medications   Prior to Admission medications   Medication Sig Start Date End Date Taking? Authorizing Provider  divalproex (DEPAKOTE ER) 500 MG 24 hr tablet Take 2 tablets (1,000 mg total) by mouth at bedtime. For mood stabilization. 08/22/13   Verne Spurr, PA-C  promethazine (PHENERGAN) 25 MG tablet Take 1 tablet (25 mg total) by mouth every 8 (eight) hours as needed for nausea or vomiting. 06/16/14   Jamesetta Orleans Lawyer, PA-C   BP 131/90  Pulse 97  Temp(Src) 98.2 F (36.8 C) (Oral)  Resp 18  SpO2 100%  Physical Exam  Nursing note and vitals reviewed. Constitutional: She is oriented to person, place, and time. She appears well-developed and well-nourished. No distress.  HENT:  Head: Normocephalic and atraumatic.  Right Ear: Tympanic membrane and ear canal normal.  Left Ear: Tympanic membrane and ear canal normal.  Nose: Nose normal.  Mouth/Throat:  Posterior oropharyngeal erythema present. No oropharyngeal exudate or posterior oropharyngeal edema.  Surgically absent uvula. Post oropharyngeal erythema. No obvious tonsillar enlargement or exudate. Normal voice. No temporal bruit.   Eyes: Conjunctivae and EOM are normal.  Neck: Neck supple. No tracheal deviation present.  No nuchal rigidity.   Cardiovascular: Normal rate, regular rhythm and normal heart sounds.   Pulmonary/Chest: Effort normal and breath sounds normal. No respiratory distress. She has no wheezes. She has no rales.  Abdominal: Soft. There is no tenderness.  Musculoskeletal: Normal range of motion.  Lymphadenopathy:    She has cervical  adenopathy.  Anterior cervical lymphadenopathy.  Neurological: She is alert and oriented to person, place, and time.  Skin: Skin is warm and dry.  Psychiatric: She has a normal mood and affect. Her behavior is normal.    ED Course  Procedures (including critical care time)  DIAGNOSTIC STUDIES: Oxygen Saturation is 100% on RA, normal by my interpretation.    COORDINATION OF CARE: 12:02 PM-Discussed treatment plan which includes rapid strep test with pt at bedside and pt agreed to plan. Advised pt if it is a bacterial infection, she will be given an antibiotic. Advised pt there are no signs of meningitis or abscess.   1:11 PM Strep test negative.  Likely URI. Pt has been taking allergy medication with no relief. Will treat sxs.  Pt to f/u with PCP as needed.  Standard return precaution discussed.    Labs Review Labs Reviewed  RAPID STREP SCREEN  CULTURE, GROUP A STREP    Imaging Review No results found.   EKG Interpretation None      MDM   Final diagnoses:  Viral syndrome     BP 131/90  Pulse 97  Temp(Src) 98.2 F (36.8 C) (Oral)  Resp 18  SpO2 100%   I personally performed the services described in this documentation, which was scribed in my presence. The recorded information has been reviewed and is accurate.  Fayrene HelperBowie Amarah Brossman, PA-C 07/26/14 1316

## 2014-07-26 NOTE — Discharge Instructions (Signed)

## 2014-07-26 NOTE — ED Notes (Signed)
Pt c/o nasal congestion, sore throat, headache, and cough since 2000 last night.

## 2014-07-27 NOTE — ED Provider Notes (Signed)
Medical screening examination/treatment/procedure(s) were performed by non-physician practitioner and as supervising physician I was immediately available for consultation/collaboration.  Toy Cookey, MD 07/27/14 [phone]

## 2014-07-28 LAB — CULTURE, GROUP A STREP

## 2014-09-01 ENCOUNTER — Encounter: Payer: Self-pay | Admitting: Internal Medicine

## 2014-09-01 ENCOUNTER — Ambulatory Visit: Payer: Self-pay | Attending: Internal Medicine | Admitting: Internal Medicine

## 2014-09-01 VITALS — BP 138/90 | HR 82 | Temp 98.5°F | Resp 16 | Wt 133.4 lb

## 2014-09-01 DIAGNOSIS — M5380 Other specified dorsopathies, site unspecified: Secondary | ICD-10-CM | POA: Insufficient documentation

## 2014-09-01 DIAGNOSIS — F17200 Nicotine dependence, unspecified, uncomplicated: Secondary | ICD-10-CM | POA: Insufficient documentation

## 2014-09-01 DIAGNOSIS — M545 Low back pain, unspecified: Secondary | ICD-10-CM | POA: Insufficient documentation

## 2014-09-01 DIAGNOSIS — Z139 Encounter for screening, unspecified: Secondary | ICD-10-CM

## 2014-09-01 DIAGNOSIS — R030 Elevated blood-pressure reading, without diagnosis of hypertension: Secondary | ICD-10-CM | POA: Insufficient documentation

## 2014-09-01 DIAGNOSIS — G8929 Other chronic pain: Secondary | ICD-10-CM | POA: Insufficient documentation

## 2014-09-01 DIAGNOSIS — M62830 Muscle spasm of back: Secondary | ICD-10-CM | POA: Insufficient documentation

## 2014-09-01 DIAGNOSIS — F319 Bipolar disorder, unspecified: Secondary | ICD-10-CM | POA: Insufficient documentation

## 2014-09-01 DIAGNOSIS — IMO0001 Reserved for inherently not codable concepts without codable children: Secondary | ICD-10-CM | POA: Insufficient documentation

## 2014-09-01 MED ORDER — IBUPROFEN 600 MG PO TABS: 600.0000 mg | ORAL_TABLET | Freq: Three times a day (TID) | ORAL | Status: DC | PRN

## 2014-09-01 MED ORDER — CYCLOBENZAPRINE HCL 10 MG PO TABS: 10.0000 mg | ORAL_TABLET | Freq: Every day | ORAL | Status: DC

## 2014-09-01 NOTE — Patient Instructions (Addendum)
DASH Eating Plan DASH stands for "Dietary Approaches to Stop Hypertension." The DASH eating plan is a healthy eating plan that has been shown to reduce high blood pressure (hypertension). Additional health benefits may include reducing the risk of type 2 diabetes mellitus, heart disease, and stroke. The DASH eating plan may also help with weight loss. WHAT DO I NEED TO KNOW ABOUT THE DASH EATING PLAN? For the DASH eating plan, you will follow these general guidelines:  Choose foods with a percent daily value for sodium of less than 5% (as listed on the food label).  Use salt-free seasonings or herbs instead of table salt or sea salt.  Check with your health care provider or pharmacist before using salt substitutes.  Eat lower-sodium products, often labeled as "lower sodium" or "no salt added."  Eat fresh foods.  Eat more vegetables, fruits, and low-fat dairy products.  Choose whole grains. Look for the word "whole" as the first word in the ingredient list.  Choose fish and skinless chicken or turkey more often than red meat. Limit fish, poultry, and meat to 6 oz (170 g) each day.  Limit sweets, desserts, sugars, and sugary drinks.  Choose heart-healthy fats.  Limit cheese to 1 oz (28 g) per day.  Eat more home-cooked food and less restaurant, buffet, and fast food.  Limit fried foods.  Cook foods using methods other than frying.  Limit canned vegetables. If you do use them, rinse them well to decrease the sodium.  When eating at a restaurant, ask that your food be prepared with less salt, or no salt if possible. WHAT FOODS CAN I EAT? Seek help from a dietitian for individual calorie needs. Grains Whole grain or whole wheat bread. Brown rice. Whole grain or whole wheat pasta. Quinoa, bulgur, and whole grain cereals. Low-sodium cereals. Corn or whole wheat flour tortillas. Whole grain cornbread. Whole grain crackers. Low-sodium crackers. Vegetables Fresh or frozen vegetables  (raw, steamed, roasted, or grilled). Low-sodium or reduced-sodium tomato and vegetable juices. Low-sodium or reduced-sodium tomato sauce and paste. Low-sodium or reduced-sodium canned vegetables.  Fruits All fresh, canned (in natural juice), or frozen fruits. Meat and Other Protein Products Ground beef (85% or leaner), grass-fed beef, or beef trimmed of fat. Skinless chicken or turkey. Ground chicken or turkey. Pork trimmed of fat. All fish and seafood. Eggs. Dried beans, peas, or lentils. Unsalted nuts and seeds. Unsalted canned beans. Dairy Low-fat dairy products, such as skim or 1% milk, 2% or reduced-fat cheeses, low-fat ricotta or cottage cheese, or plain low-fat yogurt. Low-sodium or reduced-sodium cheeses. Fats and Oils Tub margarines without trans fats. Light or reduced-fat mayonnaise and salad dressings (reduced sodium). Avocado. Safflower, olive, or canola oils. Natural peanut or almond butter. Other Unsalted popcorn and pretzels. The items listed above may not be a complete list of recommended foods or beverages. Contact your dietitian for more options. WHAT FOODS ARE NOT RECOMMENDED? Grains White bread. White pasta. White rice. Refined cornbread. Bagels and croissants. Crackers that contain trans fat. Vegetables Creamed or fried vegetables. Vegetables in a cheese sauce. Regular canned vegetables. Regular canned tomato sauce and paste. Regular tomato and vegetable juices. Fruits Dried fruits. Canned fruit in light or heavy syrup. Fruit juice. Meat and Other Protein Products Fatty cuts of meat. Ribs, chicken wings, bacon, sausage, bologna, salami, chitterlings, fatback, hot dogs, bratwurst, and packaged luncheon meats. Salted nuts and seeds. Canned beans with salt. Dairy Whole or 2% milk, cream, half-and-half, and cream cheese. Whole-fat or sweetened yogurt. Full-fat   cheeses or blue cheese. Nondairy creamers and whipped toppings. Processed cheese, cheese spreads, or cheese  curds. Condiments Onion and garlic salt, seasoned salt, table salt, and sea salt. Canned and packaged gravies. Worcestershire sauce. Tartar sauce. Barbecue sauce. Teriyaki sauce. Soy sauce, including reduced sodium. Steak sauce. Fish sauce. Oyster sauce. Cocktail sauce. Horseradish. Ketchup and mustard. Meat flavorings and tenderizers. Bouillon cubes. Hot sauce. Tabasco sauce. Marinades. Taco seasonings. Relishes. Fats and Oils Butter, stick margarine, lard, shortening, ghee, and bacon fat. Coconut, palm kernel, or palm oils. Regular salad dressings. Other Pickles and olives. Salted popcorn and pretzels. The items listed above may not be a complete list of foods and beverages to avoid. Contact your dietitian for more information. WHERE CAN I FIND MORE INFORMATION? National Heart, Lung, and [employer] Institute: www.nhlbi.nih.gov/health/health-topics/topics/dash/ Document Released: 11/10/2011 Document Revised: 04/07/2014 Document Reviewed: 09/25/2013 ExitCare Patient Information 2015 [employer]. This information is not intended to replace advice given to you by your health care provider. Make sure you discuss any questions you have with your health care provider. Smoking Cessation Quitting smoking is important to your health and has many advantages. However, it is not always easy to quit since nicotine is a very addictive drug. Oftentimes, people try 3 times or more before being able to quit. This document explains the best ways for you to prepare to quit smoking. Quitting takes hard work and a lot of effort, but you can do it. ADVANTAGES OF QUITTING SMOKING  You will live longer, feel better, and live better.  Your body will feel the impact of quitting smoking almost immediately.  Within 20 minutes, blood pressure decreases. Your pulse returns to its normal level.  After 8 hours, carbon monoxide levels in the blood return to normal. Your oxygen level increases.  After 24 hours, the chance of  having a heart attack starts to decrease. Your breath, hair, and body stop smelling like smoke.  After 48 hours, damaged nerve endings begin to recover. Your sense of taste and smell improve.  After 72 hours, the body is virtually free of nicotine. Your bronchial tubes relax and breathing becomes easier.  After 2 to 12 weeks, lungs can hold more air. Exercise becomes easier and circulation improves.  The risk of having a heart attack, stroke, cancer, or lung disease is greatly reduced.  After 1 year, the risk of coronary heart disease is cut in half.  After 5 years, the risk of stroke falls to the same as a nonsmoker.  After 10 years, the risk of lung cancer is cut in half and the risk of other cancers decreases significantly.  After 15 years, the risk of coronary heart disease drops, usually to the level of a nonsmoker.  If you are pregnant, quitting smoking will improve your chances of having a healthy baby.  The people you live with, especially any children, will be healthier.  You will have extra money to spend on things other than cigarettes. QUESTIONS TO THINK ABOUT BEFORE ATTEMPTING TO QUIT You may want to talk about your answers with your health care provider.  Why do you want to quit?  If you tried to quit in the past, what helped and what did not?  What will be the most difficult situations for you after you quit? How will you plan to handle them?  Who can help you through the tough times? Your family? Friends? A health care provider?  What pleasures do you get from smoking? What ways can you still get pleasure   if you quit? Here are some questions to ask your health care provider:  How can you help me to be successful at quitting?  What medicine do you think would be best for me and how should I take it?  What should I do if I need more help?  What is smoking withdrawal like? How can I get information on withdrawal? GET READY  Set a quit date.  Change your  environment by getting rid of all cigarettes, ashtrays, matches, and lighters in your home, car, or work. Do not let people smoke in your home.  Review your past attempts to quit. Think about what worked and what did not. GET SUPPORT AND ENCOURAGEMENT You have a better chance of being successful if you have help. You can get support in many ways.  Tell your family, friends, and coworkers that you are going to quit and need their support. Ask them not to smoke around you.  Get individual, group, or telephone counseling and support. Programs are available at local hospitals and health centers. Call your local health department for information about programs in your area.  Spiritual beliefs and practices may help some smokers quit.  Download a "quit meter" on your computer to keep track of quit statistics, such as how long you have gone without smoking, cigarettes not smoked, and money saved.  Get a self-help book about quitting smoking and staying off tobacco. LEARN NEW SKILLS AND BEHAVIORS  Distract yourself from urges to smoke. Talk to someone, go for a walk, or occupy your time with a task.  Change your normal routine. Take a different route to work. Drink tea instead of coffee. Eat breakfast in a different place.  Reduce your stress. Take a hot bath, exercise, or read a book.  Plan something enjoyable to do every day. Reward yourself for not smoking.  Explore interactive web-based programs that specialize in helping you quit. GET MEDICINE AND USE IT CORRECTLY Medicines can help you stop smoking and decrease the urge to smoke. Combining medicine with the above behavioral methods and support can greatly increase your chances of successfully quitting smoking.  Nicotine replacement therapy helps deliver nicotine to your body without the negative effects and risks of smoking. Nicotine replacement therapy includes nicotine gum, lozenges, inhalers, nasal sprays, and skin patches. Some may be  available over-the-counter and others require a prescription.  Antidepressant medicine helps people abstain from smoking, but how this works is unknown. This medicine is available by prescription.  Nicotinic receptor partial agonist medicine simulates the effect of nicotine in your brain. This medicine is available by prescription. Ask your health care provider for advice about which medicines to use and how to use them based on your health history. Your health care provider will tell you what side effects to look out for if you choose to be on a medicine or therapy. Carefully read the information on the package. Do not use any other product containing nicotine while using a nicotine replacement product.  RELAPSE OR DIFFICULT SITUATIONS Most relapses occur within the first 3 months after quitting. Do not be discouraged if you start smoking again. Remember, most people try several times before finally quitting. You may have symptoms of withdrawal because your body is used to nicotine. You may crave cigarettes, be irritable, feel very hungry, cough often, get headaches, or have difficulty concentrating. The withdrawal symptoms are only temporary. They are strongest when you first quit, but they will go away within 10-14 days. To reduce the   chances of relapse, try to:  Avoid drinking alcohol. Drinking lowers your chances of successfully quitting.  Reduce the amount of caffeine you consume. Once you quit smoking, the amount of caffeine in your body increases and can give you symptoms, such as a rapid heartbeat, sweating, and anxiety.  Avoid smokers because they can make you want to smoke.  Do not let weight gain distract you. Many smokers will gain weight when they quit, usually less than 10 pounds. Eat a healthy diet and stay active. You can always lose the weight gained after you quit.  Find ways to improve your mood other than smoking. FOR MORE INFORMATION  www.smokefree.gov  Document Released:  11/15/2001 Document Revised: 04/07/2014 Document Reviewed: 03/01/2012 ExitCare Patient Information 2015 [employer]. This information is not intended to replace advice given to you by your health care provider. Make sure you discuss any questions you have with your health care provider.  

## 2014-09-01 NOTE — Progress Notes (Signed)
Patient Demographics  [name], is a 33 y.o. female  [id number]  [id number]  DOB - Aug 08, 1981  CC:  Chief Complaint  Patient presents with  . Establish Care  . Back Pain       HPI: [name] is a 33 y.o. female here today to establish medical care. She is history of bipolar disorder/depression currently following up with [facility] and is on Seroquel/Depakote her, she denies any acute symptoms except for chronic lower back pain as per patient she was prescribed tramadol in the past, she denies any recent fall or trauma, today her blood pressure is borderline elevated her, she does report family history of hypertension, as per patient she took some medication in the past for high blood pressure but currently not on any, patient does smoke cigarettes, I have advised patient to quit smoking. Patient has No headache, No chest pain, No abdominal pain - No Nausea, No new weakness tingling or numbness, No Cough - SOB.  No Known Allergies Past Medical History  Diagnosis Date  . Ectopic pregnancy   . Urinary tract infection   . Ovarian cyst   . Hypertension     was told, but never on meds  . Dysrhythmia     hx of a-fib  . Infection     UTI  . Depression     ok now  . Back pain, chronic    Current Outpatient Prescriptions on File Prior to Visit  Medication Sig Dispense Refill  . divalproex (DEPAKOTE ER) 500 MG 24 hr tablet Take 2 tablets (1,000 mg total) by mouth at bedtime. For mood stabilization.  60 tablet  0  . guaiFENesin (ROBITUSSIN) 100 MG/5ML liquid Take 5-10 mLs (100-200 mg total) by mouth every 4 (four) hours as needed for cough.  60 mL  0  . HYDROcodone-acetaminophen (HYCET) 7.5-325 mg/15 ml solution Take 10 mLs by mouth every 6 (six) hours as needed for moderate pain.  120 mL  0  . promethazine (PHENERGAN) 25 MG tablet Take 1 tablet (25 mg total) by mouth every 8 (eight) hours as needed for nausea or vomiting.  15 tablet  0   No current  facility-administered medications on file prior to visit.   Family History  Problem Relation Age of Onset  . Other Neg Hx   . Hearing loss Neg Hx   . Hypertension Mother   . Hypertension Father   . Cancer Sister   . Heart disease Maternal Grandmother   . Cancer Maternal Grandfather    History   Social History  . Marital Status: Single    Spouse Name: N/A    Number of Children: N/A  . Years of Education: N/A   Occupational History  . Not on file.   Social History Main Topics  . Smoking status: Current Every Day Smoker -- 0.25 packs/day for 15 years    Types: Cigarettes  . Smokeless tobacco: Never Used  . Alcohol Use: Yes     Comment: OCCASIONAL  . Drug Use: No  . Sexual Activity: Yes    Birth Control/ Protection: None   Other Topics Concern  . Not on file   Social History Narrative  . No narrative on file    Review of Systems: Constitutional: Negative for fever, chills, diaphoresis, activity change, appetite change and fatigue. HENT: Negative for ear pain, nosebleeds, congestion, facial swelling, rhinorrhea, neck pain, neck stiffness and ear discharge.  Eyes: Negative for pain, discharge, redness, itching and visual disturbance. Respiratory:  Negative for cough, choking, chest tightness, shortness of breath, wheezing and stridor.  Cardiovascular: Negative for chest pain, palpitations and leg swelling. Gastrointestinal: Negative for abdominal distention. Genitourinary: Negative for dysuria, urgency, frequency, hematuria, flank pain, decreased urine volume, difficulty urinating and dyspareunia.  Musculoskeletal: Negative for back pain, joint swelling, arthralgia and gait problem. Neurological: Negative for dizziness, tremors, seizures, syncope, facial asymmetry, speech difficulty, weakness, light-headedness, numbness and headaches.  Hematological: Negative for adenopathy. Does not bruise/bleed easily. Psychiatric/Behavioral: Negative for hallucinations, behavioral  problems, confusion, dysphoric mood, decreased concentration and agitation.    Objective:   Filed Vitals:   09/01/14 1057  BP: 138/90  Pulse: 82  Temp: 98.5 F (36.9 C)  Resp: 16    Physical Exam: Constitutional: Patient appears well-developed and well-nourished. No distress. HENT: Normocephalic, atraumatic, External right and left ear normal. Oropharynx is clear and moist.  Eyes: Conjunctivae and EOM are normal. PERRLA, no scleral icterus. Neck: Normal ROM. Neck supple. No JVD. No tracheal deviation. No thyromegaly. CVS: RRR, S1/S2 +, no murmurs, no gallops, no carotid bruit.  Pulmonary: Effort and breath sounds normal, no stridor, rhonchi, wheezes, rales.  Abdominal: Soft. BS +, no distension, tenderness, rebound or guarding.  Musculoskeletal: Normal range of motion. No edema and no tenderness. Lower lumbar paraspinal tenderness SLR -ve  Neuro: Alert. Normal reflexes, muscle tone coordination. No cranial nerve deficit. Skin: Skin is warm and dry. No rash noted. Not diaphoretic. No erythema. No pallor. Psychiatric: Normal mood and affect. Behavior, judgment, thought content normal.  Lab Results  Component Value Date   WBC 10.0 06/16/2014   HGB 14.1 06/16/2014   HCT 40.6 06/16/2014   MCV 90.8 06/16/2014   PLT 284 06/16/2014   Lab Results  Component Value Date   CREATININE 0.56 06/16/2014   BUN 7 06/16/2014   NA 143 06/16/2014   K 4.1 06/16/2014   CL 105 06/16/2014   CO2 22 06/16/2014    No results found for this basename: HGBA1C   Lipid Panel  No results found for this basename: chol, trig, hdl, cholhdl, vldl, ldlcalc       Assessment and plan:   1. Elevated BP  I Have advised patient for DASH diet  2. Back muscle spasm Advised patient to apply heating pad, prescribed Flexeril and ibuprofen when necessary. - cyclobenzaprine (FLEXERIL) 10 MG tablet; Take 1 tablet (10 mg total) by mouth at bedtime.  Dispense: 30 tablet; Refill: 1 - ibuprofen (ADVIL,MOTRIN) 600 MG  tablet; Take 1 tablet (600 mg total) by mouth every 8 (eight) hours as needed.  Dispense: 30 tablet; Refill: 1  3. Bipolar disorder, unspecified Currently following up with [facility]  4. Smoking Advised patient to quit smoking.  5. Screening Ordered fasting blood work - CBC with Differential - COMPLETE METABOLIC PANEL WITH GFR - TSH - Lipid panel - Vit D  25 hydroxy (rtn osteoporosis monitoring)   Health Maintenance Patient declined flu shot   Return in about 3 months (around 12/01/2014) for Elevated BP.      Doris Cheadle, MD

## 2014-09-01 NOTE — Progress Notes (Signed)
Patient here to establish care Complains of some back pain States she does ride the  [employer] a lot which could be causing her pain

## 2014-09-02 LAB — COMPLETE METABOLIC PANEL WITH GFR
ALT: 18 U/L (ref 0–35)
AST: 16 U/L (ref 0–37)
Albumin: 4.6 g/dL (ref 3.5–5.2)
Alkaline Phosphatase: 55 U/L (ref 39–117)
BUN: 5 mg/dL — ABNORMAL LOW (ref 6–23)
CHLORIDE: 105 meq/L (ref 96–112)
CO2: 26 mEq/L (ref 19–32)
Calcium: 9.7 mg/dL (ref 8.4–10.5)
Creat: 0.51 mg/dL (ref 0.50–1.10)
GLUCOSE: 94 mg/dL (ref 70–99)
POTASSIUM: 4.2 meq/L (ref 3.5–5.3)
SODIUM: 143 meq/L (ref 135–145)
Total Bilirubin: 0.5 mg/dL (ref 0.2–1.2)
Total Protein: 7.1 g/dL (ref 6.0–8.3)

## 2014-09-02 LAB — LIPID PANEL
CHOL/HDL RATIO: 1.8 ratio
CHOLESTEROL: 103 mg/dL (ref 0–200)
HDL: 56 mg/dL (ref >39)
LDL Cholesterol: 27 mg/dL (ref 0–99)
TRIGLYCERIDES: 99 mg/dL (ref <150)
VLDL: 20 mg/dL (ref 0–40)

## 2014-09-02 LAB — CBC WITH DIFFERENTIAL/PLATELET
BASOS ABS: 0 10*3/uL (ref 0.0–0.1)
Basophils Relative: 0 % (ref 0–1)
EOS ABS: 0.1 10*3/uL (ref 0.0–0.7)
Eosinophils Relative: 1 % (ref 0–5)
HCT: 40.3 % (ref 36.0–46.0)
HEMOGLOBIN: 13.9 g/dL (ref 12.0–15.0)
Lymphocytes Relative: 26 % (ref 12–46)
Lymphs Abs: 2.1 10*3/uL (ref 0.7–4.0)
MCH: 30.9 pg (ref 26.0–34.0)
MCHC: 34.5 g/dL (ref 30.0–36.0)
MCV: 89.6 fL (ref 78.0–100.0)
Monocytes Absolute: 0.4 10*3/uL (ref 0.1–1.0)
Monocytes Relative: 5 % (ref 3–12)
NEUTROS ABS: 5.5 10*3/uL (ref 1.7–7.7)
Neutrophils Relative %: 68 % (ref 43–77)
Platelets: 334 10*3/uL (ref 150–400)
RBC: 4.5 MIL/uL (ref 3.87–5.11)
RDW: 14.8 % (ref 11.5–15.5)
WBC: 8.1 10*3/uL (ref 4.0–10.5)

## 2014-09-02 LAB — TSH: TSH: 1.203 u[IU]/mL (ref 0.350–4.500)

## 2014-09-02 LAB — VITAMIN D 25 HYDROXY (VIT D DEFICIENCY, FRACTURES): Vit D, 25-Hydroxy: 33 ng/mL (ref 30–89)

## 2014-09-19 ENCOUNTER — Other Ambulatory Visit: Payer: Self-pay

## 2014-10-06 ENCOUNTER — Encounter: Payer: Self-pay | Admitting: Internal Medicine

## 2014-10-12 ENCOUNTER — Emergency Department (HOSPITAL_COMMUNITY)
Admission: EM | Admit: 2014-10-12 | Discharge: 2014-10-12 | Disposition: A | Discharge status: Home / Self Care | Payer: Self-pay | Attending: Emergency Medicine | Admitting: Emergency Medicine

## 2014-10-12 ENCOUNTER — Encounter (HOSPITAL_COMMUNITY): Payer: Self-pay | Admitting: Emergency Medicine

## 2014-10-12 DIAGNOSIS — Z3202 Encounter for pregnancy test, result negative: Secondary | ICD-10-CM | POA: Insufficient documentation

## 2014-10-12 DIAGNOSIS — G8929 Other chronic pain: Secondary | ICD-10-CM | POA: Insufficient documentation

## 2014-10-12 DIAGNOSIS — R1084 Generalized abdominal pain: Secondary | ICD-10-CM | POA: Insufficient documentation

## 2014-10-12 DIAGNOSIS — Z87440 Personal history of urinary (tract) infections: Secondary | ICD-10-CM | POA: Insufficient documentation

## 2014-10-12 DIAGNOSIS — R112 Nausea with vomiting, unspecified: Secondary | ICD-10-CM | POA: Insufficient documentation

## 2014-10-12 DIAGNOSIS — Z8742 Personal history of other diseases of the female genital tract: Secondary | ICD-10-CM | POA: Insufficient documentation

## 2014-10-12 DIAGNOSIS — Z8739 Personal history of other diseases of the musculoskeletal system and connective tissue: Secondary | ICD-10-CM | POA: Insufficient documentation

## 2014-10-12 DIAGNOSIS — I10 Essential (primary) hypertension: Secondary | ICD-10-CM | POA: Insufficient documentation

## 2014-10-12 DIAGNOSIS — Z79899 Other long term (current) drug therapy: Secondary | ICD-10-CM | POA: Insufficient documentation

## 2014-10-12 DIAGNOSIS — Z8659 Personal history of other mental and behavioral disorders: Secondary | ICD-10-CM | POA: Insufficient documentation

## 2014-10-12 DIAGNOSIS — Z720 Tobacco use: Secondary | ICD-10-CM | POA: Insufficient documentation

## 2014-10-12 LAB — URINALYSIS, ROUTINE W REFLEX MICROSCOPIC
BILIRUBIN URINE: NEGATIVE
GLUCOSE, UA: NEGATIVE mg/dL
HGB URINE DIPSTICK: NEGATIVE
KETONES UR: 15 mg/dL — AB
Leukocytes, UA: NEGATIVE
Nitrite: NEGATIVE
PROTEIN: 30 mg/dL — AB
Specific Gravity, Urine: 1.026 (ref 1.005–1.030)
UROBILINOGEN UA: 0.2 mg/dL (ref 0.0–1.0)
pH: 8 (ref 5.0–8.0)

## 2014-10-12 LAB — CBC WITH DIFFERENTIAL/PLATELET
BASOS ABS: 0 10*3/uL (ref 0.0–0.1)
Basophils Relative: 0 % (ref 0–1)
EOS PCT: 0 % (ref 0–5)
Eosinophils Absolute: 0 10*3/uL (ref 0.0–0.7)
HEMATOCRIT: 42.9 % (ref 36.0–46.0)
Hemoglobin: 15 g/dL (ref 12.0–15.0)
Lymphocytes Relative: 25 % (ref 12–46)
Lymphs Abs: 3.1 10*3/uL (ref 0.7–4.0)
MCH: 31.3 pg (ref 26.0–34.0)
MCHC: 35 g/dL (ref 30.0–36.0)
MCV: 89.4 fL (ref 78.0–100.0)
Monocytes Absolute: 0.6 10*3/uL (ref 0.1–1.0)
Monocytes Relative: 5 % (ref 3–12)
NEUTROS ABS: 8.3 10*3/uL — AB (ref 1.7–7.7)
Neutrophils Relative %: 70 % (ref 43–77)
PLATELETS: 346 10*3/uL (ref 150–400)
RBC: 4.8 MIL/uL (ref 3.87–5.11)
RDW: 14.1 % (ref 11.5–15.5)
WBC: 12 10*3/uL — ABNORMAL HIGH (ref 4.0–10.5)

## 2014-10-12 LAB — COMPREHENSIVE METABOLIC PANEL
ALK PHOS: 68 U/L (ref 39–117)
ALT: 30 U/L (ref 0–35)
AST: 32 U/L (ref 0–37)
Albumin: 4.9 g/dL (ref 3.5–5.2)
Anion gap: 22 — ABNORMAL HIGH (ref 5–15)
BILIRUBIN TOTAL: 1 mg/dL (ref 0.3–1.2)
BUN: 10 mg/dL (ref 6–23)
CHLORIDE: 104 meq/L (ref 96–112)
CO2: 19 mEq/L (ref 19–32)
Calcium: 9.8 mg/dL (ref 8.4–10.5)
Creatinine, Ser: 0.5 mg/dL (ref 0.50–1.10)
GLUCOSE: 159 mg/dL — AB (ref 70–99)
Potassium: 4.8 mEq/L (ref 3.7–5.3)
SODIUM: 145 meq/L (ref 137–147)
Total Protein: 8.9 g/dL — ABNORMAL HIGH (ref 6.0–8.3)

## 2014-10-12 LAB — URINE MICROSCOPIC-ADD ON

## 2014-10-12 LAB — CBG MONITORING, ED: Glucose-Capillary: 100 mg/dL — ABNORMAL HIGH (ref 70–99)

## 2014-10-12 LAB — POC URINE PREG, ED: PREG TEST UR: NEGATIVE

## 2014-10-12 MED ORDER — METOCLOPRAMIDE HCL 10 MG PO TABS: 10.0000 mg | ORAL_TABLET | Freq: Four times a day (QID) | ORAL | Status: DC

## 2014-10-12 MED ORDER — ONDANSETRON HCL 4 MG/2ML IJ SOLN
4.0000 mg | Freq: Once | INTRAMUSCULAR | Status: AC
  Administered 2014-10-12: 4 mg via INTRAVENOUS
  Filled 2014-10-12: qty 2

## 2014-10-12 MED ORDER — SODIUM CHLORIDE 0.9 % IV BOLUS (SEPSIS)
1000.0000 mL | Freq: Once | INTRAVENOUS | Status: AC
  Administered 2014-10-12: 1000 mL via INTRAVENOUS

## 2014-10-12 MED ORDER — METOCLOPRAMIDE HCL 5 MG/ML IJ SOLN
10.0000 mg | Freq: Once | INTRAMUSCULAR | Status: AC
  Administered 2014-10-12: 10 mg via INTRAVENOUS
  Filled 2014-10-12: qty 2

## 2014-10-12 MED ORDER — FAMOTIDINE 20 MG PO TABS
20.0000 mg | ORAL_TABLET | Freq: Once | ORAL | Status: AC
  Administered 2014-10-12: 20 mg via ORAL
  Filled 2014-10-12: qty 1

## 2014-10-12 NOTE — ED Notes (Signed)
PA at bedside.

## 2014-10-12 NOTE — ED Notes (Signed)
CBG registered 100 on ED Glucometer.   Patient unable to void at this time.

## 2014-10-12 NOTE — Discharge Instructions (Signed)
Nausea and Vomiting °Nausea is a sick feeling that often comes before throwing up (vomiting). Vomiting is a reflex where stomach contents come out of your mouth. Vomiting can cause severe loss of body fluids (dehydration). Children and elderly adults can become dehydrated quickly, especially if they also have diarrhea. Nausea and vomiting are symptoms of a condition or disease. It is important to find the cause of your symptoms. °CAUSES  °· Direct irritation of the stomach lining. This irritation can result from increased acid production (gastroesophageal reflux disease), infection, food poisoning, taking certain medicines (such as nonsteroidal anti-inflammatory drugs), alcohol use, or tobacco use. °· Signals from the brain. These signals could be caused by a headache, heat exposure, an inner ear disturbance, increased pressure in the brain from injury, infection, a tumor, or a concussion, pain, emotional stimulus, or metabolic problems. °· An obstruction in the gastrointestinal tract (bowel obstruction). °· Illnesses such as diabetes, hepatitis, gallbladder problems, appendicitis, kidney problems, cancer, sepsis, atypical symptoms of a heart attack, or eating disorders. °· Medical treatments such as chemotherapy and radiation. °· Receiving medicine that makes you sleep (general anesthetic) during surgery. °DIAGNOSIS °Your caregiver may ask for tests to be done if the problems do not improve after a few days. Tests may also be done if symptoms are severe or if the reason for the nausea and vomiting is not clear. Tests may include: °· Urine tests. °· Blood tests. °· Stool tests. °· Cultures (to look for evidence of infection). °· X-rays or other imaging studies. °Test results can help your caregiver make decisions about treatment or the need for additional tests. °TREATMENT °You need to stay well hydrated. Drink frequently but in small amounts. You may wish to drink water, sports drinks, clear broth, or eat frozen  ice pops or gelatin dessert to help stay hydrated. When you eat, eating slowly may help prevent nausea. There are also some antinausea medicines that may help prevent nausea. °HOME CARE INSTRUCTIONS  °· Take all medicine as directed by your caregiver. °· If you do not have an appetite, do not force yourself to eat. However, you must continue to drink fluids. °· If you have an appetite, eat a normal diet unless your caregiver tells you differently. °¨ Eat a variety of complex carbohydrates (rice, wheat, potatoes, bread), lean meats, yogurt, fruits, and vegetables. °¨ Avoid high-fat foods because they are more difficult to digest. °· Drink enough water and fluids to keep your urine clear or pale yellow. °· If you are dehydrated, ask your caregiver for specific rehydration instructions. Signs of dehydration may include: °¨ Severe thirst. °¨ Dry lips and mouth. °¨ Dizziness. °¨ Dark urine. °¨ Decreasing urine frequency and amount. °¨ Confusion. °¨ Rapid breathing or pulse. °SEEK IMMEDIATE MEDICAL CARE IF:  °· You have blood or brown flecks (like coffee grounds) in your vomit. °· You have black or bloody stools. °· You have a severe headache or stiff neck. °· You are confused. °· You have severe abdominal pain. °· You have chest pain or trouble breathing. °· You do not urinate at least once every 8 hours. °· You develop cold or clammy skin. °· You continue to vomit for longer than 24 to 48 hours. °· You have a fever. °MAKE SURE YOU:  °· Understand these instructions. °· Will watch your condition. °· Will get help right away if you are not doing well or get worse. °Document Released: 11/21/2005 Document Revised: 02/13/2012 Document Reviewed: 04/20/2011 °ExitCare® Patient Information ©2015 [employer]. This information is not intended   to replace advice given to you by your health care provider. Make sure you discuss any questions you have with your health care provider.   You were evaluated in the ED today for your  nausea and vomiting. You received antinausea medicines and fluids each resolved your symptoms. You may return to the ED if your symptoms worsen or you experience new symptoms.

## 2014-10-12 NOTE — ED Notes (Signed)
Pt alert,oriented, and ambulatory upon DC. She reports she is leaving with ALL belongings she arrived with.

## 2014-10-12 NOTE — ED Notes (Signed)
Pt aware of need for urine specimen. Pt states she can not void at this time. 

## 2014-10-12 NOTE — ED Notes (Signed)
MD Walden at bedside 

## 2014-10-12 NOTE — ED Notes (Signed)
Pt reports her nausea is "much better".

## 2014-10-12 NOTE — ED Notes (Signed)
Per EMS pt comes from home c/o abd pain, n/v after drinking to much alcohol last night.  Per EMS pt was sticking fingers down her throat to make herself vomit in route.

## 2014-10-12 NOTE — ED Provider Notes (Signed)
CSN: [id number]     Arrival date & time 10/12/14  1344 History   First MD Initiated Contact with Patient 10/12/14 1502     Chief Complaint  Patient presents with  . Abdominal Pain  . Emesis     (Consider location/radiation/quality/duration/timing/severity/associated sxs/prior Treatment) HPI [name] is a 33 y.o. female with history of hypertension comes in for evaluation of abdominal discomfort and nausea. Patient states she is nauseated and has been vomiting all night due to an excessive amount of alcohol she ingested. She does not know how much she drank and cannot give and approximation. She also cannot approximate what time she started or stopped drinking. This is not common for her to do, she just reports isolated episode of "partying". She denies having taking any other illicit drugs. She complains now of nausea and constant vomiting without bloody emesis. No fevers, headache, chest pain, shortness of breath, dysuria, diarrhea or constipation, numbness or weakness.  Past Medical History  Diagnosis Date  . Ectopic pregnancy   . Urinary tract infection   . Ovarian cyst   . Hypertension     was told, but never on meds  . Dysrhythmia     hx of a-fib  . Infection     UTI  . Depression     ok now  . Back pain, chronic    Past Surgical History  Procedure Laterality Date  . Etopical surgery    . Cleft palate repair    . Laparoscopy for ectopic pregnancy  2011   Family History  Problem Relation Age of Onset  . Other Neg Hx   . Hearing loss Neg Hx   . Hypertension Mother   . Hypertension Father   . Cancer Sister   . Heart disease Maternal Grandmother   . Cancer Maternal Grandfather    History  Substance Use Topics  . Smoking status: Current Every Day Smoker -- 0.25 packs/day for 15 years    Types: Cigarettes  . Smokeless tobacco: Never Used  . Alcohol Use: Yes     Comment: OCCASIONAL   OB History    Gravida Para Term Preterm AB TAB SAB Ectopic Multiple  Living   2    2 0 1 1 0 0     Review of Systems  Constitutional: Negative for fever.  HENT: Negative for sore throat.   Eyes: Negative for visual disturbance.  Respiratory: Negative for shortness of breath.   Cardiovascular: Negative for chest pain.  Gastrointestinal: Positive for nausea, vomiting and abdominal pain.  Endocrine: Negative for polyuria.  Genitourinary: Negative for dysuria.  Skin: Negative for rash.  Neurological: Negative for headaches.      Allergies  Review of patient's allergies indicates no known allergies.  Home Medications   Prior to Admission medications   Medication Sig Start Date End Date Taking? Authorizing Provider  cyclobenzaprine (FLEXERIL) 10 MG tablet Take 1 tablet (10 mg total) by mouth at bedtime. 09/01/14  Yes Doris Cheadleeepak Advani, MD  divalproex (DEPAKOTE ER) 500 MG 24 hr tablet Take 2 tablets (1,000 mg total) by mouth at bedtime. For mood stabilization. 08/22/13   Verne SpurrNeil Mashburn, PA-C  guaiFENesin (ROBITUSSIN) 100 MG/5ML liquid Take 5-10 mLs (100-200 mg total) by mouth every 4 (four) hours as needed for cough. 07/26/14   Fayrene HelperBowie Tran, PA-C  HYDROcodone-acetaminophen (HYCET) 7.5-325 mg/15 ml solution Take 10 mLs by mouth every 6 (six) hours as needed for moderate pain. 07/26/14 07/26/15  Fayrene HelperBowie Tran, PA-C  ibuprofen (ADVIL,MOTRIN) 600 MG  tablet Take 1 tablet (600 mg total) by mouth every 8 (eight) hours as needed. 09/01/14   Doris Cheadleeepak Advani, MD  promethazine (PHENERGAN) 25 MG tablet Take 1 tablet (25 mg total) by mouth every 8 (eight) hours as needed for nausea or vomiting. 06/16/14   Jamesetta Orleanshristopher W Lawyer, PA-C   BP 139/101 mmHg  Pulse 106  Temp(Src) 97.5 F (36.4 C) (Oral)  Resp 19  SpO2 99% Physical Exam  Constitutional: She is oriented to person, place, and time. She appears well-developed and well-nourished.  Patient appears nontoxic.  HENT:  Head: Normocephalic and atraumatic.  Mouth/Throat: Oropharynx is clear and moist. No oropharyngeal exudate.   Eyes: Conjunctivae are normal. Pupils are equal, round, and reactive to light. Right eye exhibits no discharge. Left eye exhibits no discharge. No scleral icterus.  Neck: Neck supple. No JVD present. No tracheal deviation present.  Cardiovascular: Normal rate, regular rhythm and normal heart sounds.   Pulmonary/Chest: Effort normal and breath sounds normal. No respiratory distress. She has no wheezes. She has no rales.  Abdominal: Soft. There is no tenderness.  Abdomen is soft and nondistended. There is mild tenderness to palpation diffusely throughout the abdomen. Negative Rovsing's, McBurney's or Murphy's. No other obvious rashes, lesions or other deformities appreciated. No palpable masses. No rebound or guarding  Musculoskeletal: She exhibits no tenderness.  Neurological: She is alert and oriented to person, place, and time.  Cranial Nerves II-XII grossly intact  Skin: Skin is warm and dry. No rash noted. She is not diaphoretic.  Psychiatric: She has a normal mood and affect.  Patient maintains appropriate mentation, speaks in full sentences with goal oriented speech  Nursing note and vitals reviewed.   ED Course  Procedures (including critical care time) Labs Review Labs Reviewed  CBC WITH DIFFERENTIAL - Abnormal; Notable for the following:    WBC 12.0 (*)    Neutro Abs 8.3 (*)    All other components within normal limits  COMPREHENSIVE METABOLIC PANEL  URINALYSIS, ROUTINE W REFLEX MICROSCOPIC  POC URINE PREG, ED  CBG MONITORING, ED    Imaging Review No results found.   EKG Interpretation None      MDM  Vitals stable - WNL -afebrile Pt resting comfortably in ED. Nausea managed in ED. Fluids given. Patient states she feels much better after antiemetics and fluids. Tolerating fluids PO. Says she is ready to go home. PE not concerning for other acute or emergent pathology. Benign abdominal exam. Labwork noncontributory. Nonspecific leukocytosis likely due to vomiting I  feel patient's symptoms are likely due to excessive alcohol ingestion and there is no evidence for an underlying acute or emergent pathology.  Will DC with metoclopramide for any recurrent nausea. Discussed f/u with PCP and return precautions, pt very amenable to plan.  Patient stable, in good condition and is appropriate for discharge Prior to patient discharge, I discussed and reviewed this case with Dr.Walden, who also saw and evaluated the pt.   Final diagnoses:  Non-intractable vomiting with nausea, vomiting of unspecified type        Sharlene MottsBenjamin W Jameica Couts, PA-C 10/12/14 1738  Elwin MochaBlair Walden, MD 10/12/14 2214

## 2014-10-12 NOTE — ED Notes (Addendum)
Upon assessment patient has fingers in mouth. Pt was asked to not make herself vomit. Pt states "I have to, it feel like I have something in the pit of my stomach".  Pt reports she drank so much alcohol last night she is unsure how much she ingested.

## 2014-10-23 ENCOUNTER — Encounter (HOSPITAL_COMMUNITY): Payer: Self-pay | Admitting: Emergency Medicine

## 2014-10-23 ENCOUNTER — Ambulatory Visit: Payer: Self-pay | Admitting: Internal Medicine

## 2014-10-23 ENCOUNTER — Emergency Department (HOSPITAL_COMMUNITY): Payer: Self-pay

## 2014-10-23 ENCOUNTER — Emergency Department (HOSPITAL_COMMUNITY)
Admission: EM | Admit: 2014-10-23 | Discharge: 2014-10-23 | Disposition: A | Discharge status: Home / Self Care | Payer: Self-pay | Attending: Emergency Medicine | Admitting: Emergency Medicine

## 2014-10-23 DIAGNOSIS — Z87440 Personal history of urinary (tract) infections: Secondary | ICD-10-CM | POA: Insufficient documentation

## 2014-10-23 DIAGNOSIS — J019 Acute sinusitis, unspecified: Secondary | ICD-10-CM | POA: Insufficient documentation

## 2014-10-23 DIAGNOSIS — Z79899 Other long term (current) drug therapy: Secondary | ICD-10-CM | POA: Insufficient documentation

## 2014-10-23 DIAGNOSIS — R05 Cough: Secondary | ICD-10-CM | POA: Insufficient documentation

## 2014-10-23 DIAGNOSIS — Z8742 Personal history of other diseases of the female genital tract: Secondary | ICD-10-CM | POA: Insufficient documentation

## 2014-10-23 DIAGNOSIS — G8929 Other chronic pain: Secondary | ICD-10-CM | POA: Insufficient documentation

## 2014-10-23 DIAGNOSIS — R059 Cough, unspecified: Secondary | ICD-10-CM

## 2014-10-23 DIAGNOSIS — Z8659 Personal history of other mental and behavioral disorders: Secondary | ICD-10-CM | POA: Insufficient documentation

## 2014-10-23 DIAGNOSIS — I10 Essential (primary) hypertension: Secondary | ICD-10-CM | POA: Insufficient documentation

## 2014-10-23 DIAGNOSIS — Z87891 Personal history of nicotine dependence: Secondary | ICD-10-CM | POA: Insufficient documentation

## 2014-10-23 MED ORDER — HYDROCODONE-ACETAMINOPHEN 5-325 MG PO TABS: ORAL_TABLET | ORAL | Status: DC

## 2014-10-23 MED ORDER — FLUTICASONE PROPIONATE 50 MCG/ACT NA SUSP
2.0000 | Freq: Every day | NASAL | Status: DC
  Administered 2014-10-23: 2 via NASAL
  Filled 2014-10-23: qty 16

## 2014-10-23 MED ORDER — AMOXICILLIN 500 MG PO CAPS: 500.0000 mg | ORAL_CAPSULE | Freq: Three times a day (TID) | ORAL | Status: DC

## 2014-10-23 MED ORDER — AMOXICILLIN 500 MG PO CAPS
500.0000 mg | ORAL_CAPSULE | Freq: Once | ORAL | Status: AC
  Administered 2014-10-23: 500 mg via ORAL
  Filled 2014-10-23: qty 1

## 2014-10-23 NOTE — Discharge Instructions (Signed)
Take vicodin for breakthrough pain, do not drink alcohol, drive, care for children or do other critical tasks while taking vicodin.  Use nasal saline (you can try [employer] Simply Saline) at least 4 times a day, use saline 5-10 minutes before using the fluticasone (flonase)  Do not use Afrin (Oxymetazoline)  Rest, wash hands frequently  and drink plenty of water.   Sinusitis Sinusitis is redness, soreness, and inflammation of the paranasal sinuses. Paranasal sinuses are air pockets within the bones of your face (beneath the eyes, the middle of the forehead, or above the eyes). In healthy paranasal sinuses, mucus is able to drain out, and air is able to circulate through them by way of your nose. However, when your paranasal sinuses are inflamed, mucus and air can become trapped. This can allow bacteria and other germs to grow and cause infection. Sinusitis can develop quickly and last only a short time (acute) or continue over a long period (chronic). Sinusitis that lasts for more than 12 weeks is considered chronic.  CAUSES  Causes of sinusitis include:  Allergies.  Structural abnormalities, such as displacement of the cartilage that separates your nostrils (deviated septum), which can decrease the air flow through your nose and sinuses and affect sinus drainage.  Functional abnormalities, such as when the small hairs (cilia) that line your sinuses and help remove mucus do not work properly or are not present. SIGNS AND SYMPTOMS  Symptoms of acute and chronic sinusitis are the same. The primary symptoms are pain and pressure around the affected sinuses. Other symptoms include:  Upper toothache.  Earache.  Headache.  Bad breath.  Decreased sense of smell and taste.  A cough, which worsens when you are lying flat.  Fatigue.  Fever.  Thick drainage from your nose, which often is green and may contain pus (purulent).  Swelling and warmth over the affected  sinuses. DIAGNOSIS  Your health care provider will perform a physical exam. During the exam, your health care provider may:  Look in your nose for signs of abnormal growths in your nostrils (nasal polyps).  Tap over the affected sinus to check for signs of infection.  View the inside of your sinuses (endoscopy) using an imaging device that has a light attached (endoscope). If your health care provider suspects that you have chronic sinusitis, one or more of the following tests may be recommended:  Allergy tests.  Nasal culture. A sample of mucus is taken from your nose, sent to a lab, and screened for bacteria.  Nasal cytology. A sample of mucus is taken from your nose and examined by your health care provider to determine if your sinusitis is related to an allergy. TREATMENT  Most cases of acute sinusitis are related to a viral infection and will resolve on their own within 10 days. Sometimes medicines are prescribed to help relieve symptoms (pain medicine, decongestants, nasal steroid sprays, or saline sprays).  However, for sinusitis related to a bacterial infection, your health care provider will prescribe antibiotic medicines. These are medicines that will help kill the bacteria causing the infection.  Rarely, sinusitis is caused by a fungal infection. In theses cases, your health care provider will prescribe antifungal medicine. For some cases of chronic sinusitis, surgery is needed. Generally, these are cases in which sinusitis recurs more than 3 times per year, despite other treatments. HOME CARE INSTRUCTIONS   Drink plenty of water. Water helps thin the mucus so your sinuses can drain more easily.  Use a  humidifier.  Inhale steam 3 to 4 times a day (for example, sit in the bathroom with the shower running).  Apply a warm, moist washcloth to your face 3 to 4 times a day, or as directed by your health care provider.  Use saline nasal sprays to help moisten and clean your  sinuses.  Take medicines only as directed by your health care provider.  If you were prescribed either an antibiotic or antifungal medicine, finish it all even if you start to feel better. SEEK IMMEDIATE MEDICAL CARE IF:  You have increasing pain or severe headaches.  You have nausea, vomiting, or drowsiness.  You have swelling around your face.  You have vision problems.  You have a stiff neck.  You have difficulty breathing. MAKE SURE YOU:   Understand these instructions.  Will watch your condition.  Will get help right away if you are not doing well or get worse. Document Released: 11/21/2005 Document Revised: 04/07/2014 Document Reviewed: 12/06/2011 [facility] Patient Information 2015 [employer] [place]. This information is not intended to replace advice given to you by your health care provider. Make sure you discuss any questions you have with your health care provider.

## 2014-10-23 NOTE — ED Provider Notes (Signed)
CSN: [id number]     Arrival date & time 10/23/14  1002 History  This chart was scribed for non-physician practitioner, Wynetta EmeryNicole Mikaylee Arseneau, PA-C, working with Tilden FossaElizabeth Rees, MD, by Ronney LionSuzanne Le, ED Scribe. This patient was seen in room TR07C/TR07C and the patient's care was started at 12:18 PM.    Chief Complaint  Patient presents with  . Cough  . Nasal Congestion   The history is provided by the patient. No language interpreter was used.    HPI Comments: [name] is a 33 y.o. female who presents to the Emergency Department complaining of a productive cough for the past 3 weeks that produces green sputum in the evening and yellow sputum in the morning. She complains of associated sore throat, nasal congestion, and post-tussive nausea. She has a non-painful hernia that she wants to remove sometime in the future that isn't painful, but is felt with her cough. Patient denies chest pain, fever, SOB, vomiting, diarrhea. She has NKDA.  Past Medical History  Diagnosis Date  . Ectopic pregnancy   . Urinary tract infection   . Ovarian cyst   . Hypertension     was told, but never on meds  . Dysrhythmia     hx of a-fib  . Infection     UTI  . Depression     ok now  . Back pain, chronic    Past Surgical History  Procedure Laterality Date  . Etopical surgery    . Cleft palate repair    . Laparoscopy for ectopic pregnancy  2011   Family History  Problem Relation Age of Onset  . Other Neg Hx   . Hearing loss Neg Hx   . Hypertension Mother   . Hypertension Father   . Cancer Sister   . Heart disease Maternal Grandmother   . Cancer Maternal Grandfather    History  Substance Use Topics  . Smoking status: Former Smoker -- 0.25 packs/day for 15 years    Types: Cigarettes    Quit date: 10/02/2014  . Smokeless tobacco: Never Used  . Alcohol Use: Yes     Comment: OCCASIONAL   OB History    Gravida Para Term Preterm AB TAB SAB Ectopic Multiple Living   2    2 0 1 1 0 0      Review of Systems  All other systems reviewed and are negative.  A complete 10 system review of systems was obtained and all systems are negative except as noted in the HPI and PMH.    Allergies  Review of patient's allergies indicates no known allergies.  Home Medications   Prior to Admission medications   Medication Sig Start Date End Date Taking? Authorizing Provider  cyclobenzaprine (FLEXERIL) 10 MG tablet Take 1 tablet (10 mg total) by mouth at bedtime. 09/01/14   Doris Cheadleeepak Advani, MD  divalproex (DEPAKOTE ER) 500 MG 24 hr tablet Take 2 tablets (1,000 mg total) by mouth at bedtime. For mood stabilization. 08/22/13   Verne SpurrNeil Mashburn, PA-C  guaiFENesin (ROBITUSSIN) 100 MG/5ML liquid Take 5-10 mLs (100-200 mg total) by mouth every 4 (four) hours as needed for cough. 07/26/14   Fayrene HelperBowie Tran, PA-C  HYDROcodone-acetaminophen (HYCET) 7.5-325 mg/15 ml solution Take 10 mLs by mouth every 6 (six) hours as needed for moderate pain. 07/26/14 07/26/15  Fayrene HelperBowie Tran, PA-C  ibuprofen (ADVIL,MOTRIN) 600 MG tablet Take 1 tablet (600 mg total) by mouth every 8 (eight) hours as needed. 09/01/14   Doris Cheadleeepak Advani, MD  metoCLOPramide (REGLAN)  10 MG tablet Take 1 tablet (10 mg total) by mouth every 6 (six) hours. 10/12/14   Sharlene MottsBenjamin W Cartner, PA-C  promethazine (PHENERGAN) 25 MG tablet Take 1 tablet (25 mg total) by mouth every 8 (eight) hours as needed for nausea or vomiting. 06/16/14   Jamesetta Orleanshristopher W Lawyer, PA-C   BP 135/87 mmHg  Pulse 90  Temp(Src) 98.2 F (36.8 C) (Oral)  Resp 20  Ht 5' (1.524 m)  Wt 135 lb (61.236 kg)  BMI 26.37 kg/m2  SpO2 99% Physical Exam  Constitutional: She is oriented to person, place, and time. She appears well-developed and well-nourished. No distress.  HENT:  Head: Normocephalic.  Mouth/Throat: Oropharynx is clear and moist.  Bilateral edema of the mucosal membranes in the nares. Patient is tenderness palpation of bilateral maxillary sinus. Bilateral tympanic membrane with normal  architecture and good light reflex. Posterior pharynx is mildly injected, uvula is midline, soft palate rises symmetrically.  Eyes: Conjunctivae and EOM are normal. Pupils are equal, round, and reactive to light.  Neck: Normal range of motion.  Cardiovascular: Normal rate, regular rhythm and intact distal pulses.   Pulmonary/Chest: Effort normal and breath sounds normal. No stridor. No respiratory distress. She has no wheezes. She has no rales. She exhibits no tenderness.  Abdominal: Soft. Bowel sounds are normal. She exhibits no distension and no mass. There is no tenderness. There is no rebound and no guarding.  Musculoskeletal: Normal range of motion.  Neurological: She is alert and oriented to person, place, and time.  Psychiatric: She has a normal mood and affect.  Nursing note and vitals reviewed.   ED Course  Procedures (including critical care time)  DIAGNOSTIC STUDIES: Oxygen Saturation is 99% on RA, normal by my interpretation.    COORDINATION OF CARE: 12:18 PM - Discussed treatment plan with pt at bedside which includes Amoxicillin, nasal steroid spray (Flonase), and a narcotic (Vicodin) and pt agreed to plan.   Labs Review Labs Reviewed - No data to display  Imaging Review Dg Chest 2 View  10/23/2014   CLINICAL DATA:  Cough for few weeks, chest tightness  EXAM: CHEST  2 VIEW  COMPARISON:  None.  FINDINGS: Normal mediastinum and cardiac silhouette. Right-sided aortic arch again noted. Normal pulmonary vasculature. No evidence of effusion, infiltrate, or pneumothorax. No acute bony abnormality.  IMPRESSION: Normal chest radiograph with right-sided arch.   Electronically Signed   By: Genevive BiStewart  Edmunds M.D.   On: 10/23/2014 11:27     EKG Interpretation None      MDM   Final diagnoses:  Acute sinusitis, recurrence not specified, unspecified location    Filed Vitals:   10/23/14 1023 10/23/14 1303  BP: 135/87 134/99  Pulse: 90 80  Temp: 98.2 F (36.8 C) 98 F (36.7  C)  TempSrc: Oral Oral  Resp: 20 16  Height: 5' (1.524 m)   Weight: 135 lb (61.236 kg)   SpO2: 99% 100%    Medications  amoxicillin (AMOXIL) capsule 500 mg (500 mg Oral Given 10/23/14 1226)    [name] is a 33 y.o. female presenting with symptoms and physical exam consistent with a bacterial sinusitis. Patient will be started on amoxicillin  Evaluation does not show pathology that would require ongoing emergent intervention or inpatient treatment. Pt is hemodynamically stable and mentating appropriately. Discussed findings and plan with patient/guardian, who agrees with care plan. All questions answered. Return precautions discussed and outpatient follow up given.   Discharge Medication List as of 10/23/2014 12:26 PM  START taking these medications   Details  amoxicillin (AMOXIL) 500 MG capsule Take 1 capsule (500 mg total) by mouth 3 (three) times daily., Starting 10/23/2014, Until Discontinued, Print    HYDROcodone-acetaminophen (NORCO/VICODIN) 5-325 MG per tablet Take 1-2 tablets by mouth every 6 hours as needed for pain., Print         I personally performed the services described in this documentation, which was scribed in my presence. The recorded information has been reviewed and is accurate.    Wynetta Emery, PA-C 10/23/14 1710  Tilden Fossa, MD 10/23/14 [id number]

## 2014-10-23 NOTE — ED Notes (Signed)
Patient states ongoing productive cough with green sputum x 3 weeks.  Patient states nasal congestion, denies chest congestion.    Patient denies other symptoms. Patient states has tried robitussin DM, theraflu, delsym and tea with honey and lemon and nothing helping.

## 2014-12-07 ENCOUNTER — Encounter (HOSPITAL_COMMUNITY): Payer: Self-pay | Admitting: Emergency Medicine

## 2014-12-07 ENCOUNTER — Emergency Department (HOSPITAL_COMMUNITY)
Admission: EM | Admit: 2014-12-07 | Discharge: 2014-12-07 | Disposition: A | Discharge status: Home / Self Care | Payer: 59 | Attending: Emergency Medicine | Admitting: Emergency Medicine

## 2014-12-07 DIAGNOSIS — Z8619 Personal history of other infectious and parasitic diseases: Secondary | ICD-10-CM | POA: Diagnosis not present

## 2014-12-07 DIAGNOSIS — Z8742 Personal history of other diseases of the female genital tract: Secondary | ICD-10-CM | POA: Diagnosis not present

## 2014-12-07 DIAGNOSIS — H9201 Otalgia, right ear: Secondary | ICD-10-CM | POA: Diagnosis present

## 2014-12-07 DIAGNOSIS — Z8659 Personal history of other mental and behavioral disorders: Secondary | ICD-10-CM | POA: Diagnosis not present

## 2014-12-07 DIAGNOSIS — R0981 Nasal congestion: Secondary | ICD-10-CM | POA: Insufficient documentation

## 2014-12-07 DIAGNOSIS — H6121 Impacted cerumen, right ear: Secondary | ICD-10-CM | POA: Diagnosis not present

## 2014-12-07 DIAGNOSIS — I10 Essential (primary) hypertension: Secondary | ICD-10-CM | POA: Insufficient documentation

## 2014-12-07 DIAGNOSIS — Z87891 Personal history of nicotine dependence: Secondary | ICD-10-CM | POA: Diagnosis not present

## 2014-12-07 DIAGNOSIS — Z87440 Personal history of urinary (tract) infections: Secondary | ICD-10-CM | POA: Diagnosis not present

## 2014-12-07 DIAGNOSIS — M545 Low back pain, unspecified: Secondary | ICD-10-CM

## 2014-12-07 DIAGNOSIS — H6091 Unspecified otitis externa, right ear: Secondary | ICD-10-CM

## 2014-12-07 DIAGNOSIS — G8929 Other chronic pain: Secondary | ICD-10-CM | POA: Insufficient documentation

## 2014-12-07 DIAGNOSIS — R197 Diarrhea, unspecified: Secondary | ICD-10-CM | POA: Insufficient documentation

## 2014-12-07 DIAGNOSIS — J3489 Other specified disorders of nose and nasal sinuses: Secondary | ICD-10-CM | POA: Insufficient documentation

## 2014-12-07 MED ORDER — CYCLOBENZAPRINE HCL 10 MG PO TABS
10.0000 mg | ORAL_TABLET | Freq: Once | ORAL | Status: AC
  Administered 2014-12-07: 10 mg via ORAL
  Filled 2014-12-07: qty 1

## 2014-12-07 MED ORDER — METHOCARBAMOL 750 MG PO TABS: 750.0000 mg | ORAL_TABLET | Freq: Four times a day (QID) | ORAL | Status: DC | PRN

## 2014-12-07 MED ORDER — NEOMYCIN-POLYMYXIN-HC 3.5-10000-1 OT SOLN: 4.0000 [drp] | Freq: Four times a day (QID) | OTIC | Status: DC

## 2014-12-07 NOTE — ED Notes (Signed)
Pt from home c/o right ear pain x 2 days ago and lower back pain from falling when it rained.

## 2014-12-07 NOTE — Discharge Instructions (Signed)
.Read the information below.  Use the prescribed medication as directed.  Please discuss all new medications with your pharmacist.  You may return to the Emergency Department at any time for worsening condition or any new symptoms that concern you.  If there is any possibility that you might be pregnant, please let your health care provider know and discuss this with the pharmacist to ensure medication safety.   If you develop fevers, loss of control of bowel or bladder, weakness or numbness in your legs, or are unable to walk, return to the ER for a recheck. If you develop fevers, uncontrolled ear pain, bleeding or discharge from your ear, see your doctor or return for a recheck.      Back Pain, Adult Low back pain is very common. About 1 in 5 people have back pain.The cause of low back pain is rarely dangerous. The pain often gets better over time.About half of people with a sudden onset of back pain feel better in just 2 weeks. About 8 in 10 people feel better by 6 weeks.  CAUSES Some common causes of back pain include:  Strain of the muscles or ligaments supporting the spine.  Wear and tear (degeneration) of the spinal discs.  Arthritis.  Direct injury to the back. DIAGNOSIS Most of the time, the direct cause of low back pain is not known.However, back pain can be treated effectively even when the exact cause of the pain is unknown.Answering your caregiver's questions about your overall health and symptoms is one of the most accurate ways to make sure the cause of your pain is not dangerous. If your caregiver needs more information, he or she may order lab work or imaging tests (X-rays or MRIs).However, even if imaging tests show changes in your back, this usually does not require surgery. HOME CARE INSTRUCTIONS For many people, back pain returns.Since low back pain is rarely dangerous, it is often a condition that people can learn to [facility] their own.   Remain active. It is  stressful on the back to sit or stand in one place. Do not sit, drive, or stand in one place for more than 30 minutes at a time. Take short walks on level surfaces as soon as pain allows.Try to increase the length of time you walk each day.  Do not stay in bed.Resting more than 1 or 2 days can delay your recovery.  Do not avoid exercise or work.Your body is made to move.It is not dangerous to be active, even though your back may hurt.Your back will likely heal faster if you return to being active before your pain is gone.  Pay attention to your body when you bend and lift. Many people have less discomfortwhen lifting if they bend their knees, keep the load close to their bodies,and avoid twisting. Often, the most comfortable positions are those that put less stress on your recovering back.  Find a comfortable position to sleep. Use a firm mattress and lie on your side with your knees slightly bent. If you lie on your back, put a pillow under your knees.  Only take over-the-counter or prescription medicines as directed by your caregiver. Over-the-counter medicines to reduce pain and inflammation are often the most helpful.Your caregiver may prescribe muscle relaxant drugs.These medicines help dull your pain so you can more quickly return to your normal activities and healthy exercise.  Put ice on the injured area.  Put ice in a plastic bag.  Place a towel between your skin  and the bag.  Leave the ice on for 15-20 minutes, 03-04 times a day for the first 2 to 3 days. After that, ice and heat may be alternated to reduce pain and spasms.  Ask your caregiver about trying back exercises and gentle massage. This may be of some benefit.  Avoid feeling anxious or stressed.Stress increases muscle tension and can worsen back pain.It is important to recognize when you are anxious or stressed and learn ways to manage it.Exercise is a great option. SEEK MEDICAL CARE IF:  You have pain that is  not relieved with rest or medicine.  You have pain that does not improve in 1 week.  You have new symptoms.  You are generally not feeling well. SEEK IMMEDIATE MEDICAL CARE IF:   You have pain that radiates from your back into your legs.  You develop new bowel or bladder control problems.  You have unusual weakness or numbness in your arms or legs.  You develop nausea or vomiting.  You develop abdominal pain.  You feel faint. Document Released: 11/21/2005 Document Revised: 05/22/2012 Document Reviewed: 03/25/2014 [facility] Patient Information 2015 [place], [place]. This information is not intended to replace advice given to you by your health care provider. Make sure you discuss any questions you have with your health care provider.  Cerumen Impaction A cerumen impaction is when the wax in your ear forms a plug. This plug usually causes reduced hearing. Sometimes it also causes an earache or dizziness. Removing a cerumen impaction can be difficult and painful. The wax sticks to the ear canal. The canal is sensitive and bleeds easily. If you try to remove a heavy wax buildup with a cotton tipped swab, you may push it in further. Irrigation with water, suction, and small ear curettes may be used to clear out the wax. If the impaction is fixed to the skin in the ear canal, ear drops may be needed for a few days to loosen the wax. People who build up a lot of wax frequently can use ear wax removal products available in your local drugstore. SEEK MEDICAL CARE IF:  You develop an earache, increased hearing loss, or marked dizziness. Document Released: 12/29/2004 Document Revised: 02/13/2012 Document Reviewed: 02/18/2010 [facility] Patient Information 2015 [place], [place]. This information is not intended to replace advice given to you by your health care provider. Make sure you discuss any questions you have with your health care provider.  Otitis Externa Otitis externa is a germ infection in  the outer ear. The outer ear is the area from the eardrum to the outside of the ear. Otitis externa is sometimes called "swimmer's ear." HOME CARE  Put drops in the ear as told by your doctor.  Only take medicine as told by your doctor.  If you have diabetes, your doctor may give you more directions. Follow your doctor's directions.  Keep all doctor visits as told. To avoid another infection:  Keep your ear dry. Use the corner of a towel to dry your ear after swimming or bathing.  Avoid scratching or putting things inside your ear.  Avoid swimming in lakes, dirty water, or pools that use a chemical called chlorine poorly.  You may use ear drops after swimming. Combine equal amounts of white vinegar and alcohol in a bottle. Put 3 or 4 drops in each ear. GET HELP IF:   You have a fever.  Your ear is still red, puffy (swollen), or painful after 3 days.  You still have yellowish-white  fluid (pus) coming from the ear after 3 days.  Your redness, puffiness, or pain gets worse.  You have a really bad headache.  You have redness, puffiness, pain, or tenderness behind your ear. MAKE SURE YOU:   Understand these instructions.  Will watch your condition.  Will get help right away if you are not doing well or get worse. Document Released: 05/09/2008 Document Revised: 04/07/2014 Document Reviewed: 12/08/2011 [facility] Patient Information 2015 [place], [place]. This information is not intended to replace advice given to you by your health care provider. Make sure you discuss any questions you have with your health care provider.

## 2014-12-07 NOTE — ED Provider Notes (Signed)
CSN: [id number]     Arrival date & time 12/07/14  1349 History  This chart was scribed for Trixie Dredge, PA-C, working with Toy Cookey, MD by Chestine Spore, ED Scribe. The patient was seen in room WTR8/WTR8 at 3:03 PM.    Chief Complaint  Patient presents with  . Otalgia  . Back Pain     The history is provided by the patient. No language interpreter was used.    HPI Comments:  Pt with two complaints: ear discomfort and low back pain. [name] is a 34 y.o. female who presents to the Emergency Department complaining of intermittent right ear pain onset 2 days ago. Notes it does not generally hurt but her hearing has decreased in the ear and this bothers her.  When she does have pain it is a quick sharp pain.  Denies drainage.  She reports that she has the back pain from when she fell when it was raining 4-5 days ago. Her yard is on a slant and she fell down the yard on her back. The back pain is non-radiating. It is described as a soreness and is worse with movement and walking. She states that she has tried 600 mg ibuprofen with no relief for her symptoms.   She denies ear drainage, fever, sore throat, SOB, CP, abdominal pain, loss of control of bowel or bladder, weakness of numbness of the extremities, saddle anesthesia, bowel, urinary, or vaginal complaints.     Past Medical History  Diagnosis Date  . Ectopic pregnancy   . Urinary tract infection   . Ovarian cyst   . Hypertension     was told, but never on meds  . Dysrhythmia     hx of a-fib  . Infection     UTI  . Depression     ok now  . Back pain, chronic    Past Surgical History  Procedure Laterality Date  . Etopical surgery    . Cleft palate repair    . Laparoscopy for ectopic pregnancy  2011   Family History  Problem Relation Age of Onset  . Other Neg Hx   . Hearing loss Neg Hx   . Hypertension Mother   . Hypertension Father   . Cancer Sister   . Heart disease Maternal Grandmother   . Cancer  Maternal Grandfather    History  Substance Use Topics  . Smoking status: Former Smoker -- 0.25 packs/day for 15 years    Types: Cigarettes    Quit date: 10/02/2014  . Smokeless tobacco: Never Used  . Alcohol Use: Yes     Comment: OCCASIONAL   OB History    Gravida Para Term Preterm AB TAB SAB Ectopic Multiple Living   2    2 0 1 1 0 0     Review of Systems  Constitutional: Negative for fever and chills.  HENT: Positive for congestion, ear pain and rhinorrhea. Negative for ear discharge and sore throat.   Respiratory: Positive for cough. Negative for shortness of breath.   Cardiovascular: Negative for chest pain.  Gastrointestinal: Positive for diarrhea. Negative for abdominal pain.  Genitourinary: Positive for menstrual problem (chronic irregular periods, unchanged). Negative for dysuria, urgency, frequency, vaginal bleeding and vaginal discharge.  Musculoskeletal: Positive for back pain.  All other systems reviewed and are negative.     Allergies  Review of patient's allergies indicates no known allergies.  Home Medications   Prior to Admission medications   Medication Sig Start Date End  Date Taking? Authorizing Provider  ibuprofen (ADVIL,MOTRIN) 600 MG tablet Take 1 tablet (600 mg total) by mouth every 8 (eight) hours as needed. Patient taking differently: Take 600 mg by mouth every 8 (eight) hours as needed for moderate pain.  09/01/14  Yes Doris Cheadle, MD  amoxicillin (AMOXIL) 500 MG capsule Take 1 capsule (500 mg total) by mouth 3 (three) times daily. Patient not taking: Reported on 12/07/2014 10/23/14   Joni Reining Pisciotta, PA-C  cyclobenzaprine (FLEXERIL) 10 MG tablet Take 1 tablet (10 mg total) by mouth at bedtime. Patient not taking: Reported on 12/07/2014 09/01/14   Doris Cheadle, MD  divalproex (DEPAKOTE ER) 500 MG 24 hr tablet Take 2 tablets (1,000 mg total) by mouth at bedtime. For mood stabilization. Patient not taking: Reported on 12/07/2014 08/22/13   Verne Spurr,  PA-C  guaiFENesin (ROBITUSSIN) 100 MG/5ML liquid Take 5-10 mLs (100-200 mg total) by mouth every 4 (four) hours as needed for cough. Patient not taking: Reported on 12/07/2014 07/26/14   Fayrene Helper, PA-C  HYDROcodone-acetaminophen (NORCO/VICODIN) 5-325 MG per tablet Take 1-2 tablets by mouth every 6 hours as needed for pain. Patient not taking: Reported on 12/07/2014 10/23/14   Joni Reining Pisciotta, PA-C  metoCLOPramide (REGLAN) 10 MG tablet Take 1 tablet (10 mg total) by mouth every 6 (six) hours. Patient not taking: Reported on 12/07/2014 10/12/14   Earle Gell Cartner, PA-C  promethazine (PHENERGAN) 25 MG tablet Take 1 tablet (25 mg total) by mouth every 8 (eight) hours as needed for nausea or vomiting. Patient not taking: Reported on 12/07/2014 06/16/14   Jamesetta Orleans Lawyer, PA-C   BP 155/111 mmHg  Pulse 94  Temp(Src) 98.4 F (36.9 C) (Oral)  Resp 18  SpO2 100%  Physical Exam  Constitutional: She appears well-developed and well-nourished. No distress.  HENT:  Head: Normocephalic and atraumatic.  Right Ear: No mastoid tenderness.  Left Ear: Tympanic membrane and ear canal normal.  Mouth/Throat: Oropharynx is clear and moist. No oropharyngeal exudate.  Right ear: No pain with traction on the penna. Pain with pressure on the tragus. Cerumen impaction on the right. No mastoid tenderness.  After cerumen removal: The right ear is erythematous. Cerumen is gone.   Eyes: Conjunctivae are normal.  Neck: Neck supple.  Cardiovascular: Normal rate, regular rhythm and normal heart sounds.  Exam reveals no gallop and no friction rub.   No murmur heard. Pulmonary/Chest: Effort normal and breath sounds normal. No respiratory distress. She has no wheezes. She has no rales.  Abdominal: Soft. She exhibits no distension and no mass. There is no tenderness. There is no rebound and no guarding.  Musculoskeletal:  Spine nontender, no crepitus, or stepoffs. Lower extremities:  Strength 5/5, sensation intact, distal  pulses intact.    Lymphadenopathy:    She has no cervical adenopathy.  Neurological: She is alert.  Skin: She is not diaphoretic.  Nursing note and vitals reviewed.   ED Course  Procedures (including critical care time) DIAGNOSTIC STUDIES: Oxygen Saturation is 100% on room air, normal by my interpretation.    COORDINATION OF CARE: 3:10 PM-Discussed treatment plan which includes flexeril, cerumen removal with pt at bedside and pt agreed to plan.   Labs Review Labs Reviewed - No data to display  Imaging Review No results found.   EKG Interpretation None      MDM   Final diagnoses:  Hearing loss of right ear due to cerumen impaction  Otitis externa, right  Right-sided low back pain without sciatica  Afebrile, nontoxic patient with right ear decreased hearing due to cerumen impaction, irrigated in ED by nurse with relief, though pt noted to have otitis externa.  Also with low back soreness after falling a few days ago.  No red flags for back pain.  No bony tenderness.  Neurologically intact.  No indication for emergent imaging at this time.    D/C home with cortisporin otic and robaxin. PCP follow up.  Discussed result, findings, treatment, and follow up  with patient.  Pt given return precautions.  Pt verbalizes understanding and agrees with plan.       I personally performed the services described in this documentation, which was scribed in my presence. The recorded information has been reviewed and is accurate.    Trixie Dredge, PA-C 12/07/14 1652  Toy Cookey, MD 12/07/14 [phone]

## 2015-02-02 ENCOUNTER — Emergency Department (HOSPITAL_COMMUNITY)
Admission: EM | Admit: 2015-02-02 | Discharge: 2015-02-02 | Disposition: A | Discharge status: Home / Self Care | Payer: 59 | Attending: Emergency Medicine | Admitting: Emergency Medicine

## 2015-02-02 ENCOUNTER — Encounter (HOSPITAL_COMMUNITY): Payer: Self-pay

## 2015-02-02 DIAGNOSIS — F329 Major depressive disorder, single episode, unspecified: Secondary | ICD-10-CM | POA: Diagnosis not present

## 2015-02-02 DIAGNOSIS — Z87891 Personal history of nicotine dependence: Secondary | ICD-10-CM | POA: Insufficient documentation

## 2015-02-02 DIAGNOSIS — M545 Low back pain, unspecified: Secondary | ICD-10-CM

## 2015-02-02 DIAGNOSIS — Z79899 Other long term (current) drug therapy: Secondary | ICD-10-CM | POA: Diagnosis not present

## 2015-02-02 DIAGNOSIS — G8929 Other chronic pain: Secondary | ICD-10-CM | POA: Diagnosis not present

## 2015-02-02 DIAGNOSIS — I10 Essential (primary) hypertension: Secondary | ICD-10-CM | POA: Diagnosis not present

## 2015-02-02 DIAGNOSIS — Z87440 Personal history of urinary (tract) infections: Secondary | ICD-10-CM | POA: Diagnosis not present

## 2015-02-02 DIAGNOSIS — Z8742 Personal history of other diseases of the female genital tract: Secondary | ICD-10-CM | POA: Diagnosis not present

## 2015-02-02 DIAGNOSIS — M549 Dorsalgia, unspecified: Secondary | ICD-10-CM | POA: Diagnosis present

## 2015-02-02 MED ORDER — DIAZEPAM 5 MG PO TABS: 5.0000 mg | ORAL_TABLET | Freq: Two times a day (BID) | ORAL | Status: DC

## 2015-02-02 MED ORDER — DIAZEPAM 5 MG PO TABS
5.0000 mg | ORAL_TABLET | Freq: Once | ORAL | Status: AC
  Administered 2015-02-02: 5 mg via ORAL
  Filled 2015-02-02: qty 1

## 2015-02-02 MED ORDER — KETOROLAC TROMETHAMINE 60 MG/2ML IM SOLN
60.0000 mg | Freq: Once | INTRAMUSCULAR | Status: AC
  Administered 2015-02-02: 60 mg via INTRAMUSCULAR
  Filled 2015-02-02: qty 2

## 2015-02-02 NOTE — ED Provider Notes (Signed)
CSN: [id number]     Arrival date & time 02/02/15  0945 History   First MD Initiated Contact with Patient 02/02/15 1002     Chief Complaint  Patient presents with  . Back Pain     (Consider location/radiation/quality/duration/timing/severity/associated sxs/prior Treatment) HPI  Arnice O Shiller is a 34 y.o. female with PMH of chronic back pain, hypertension, ovarian cyst presenting with right-sided and central back pain that started this morning.  Patient states it is  worse with movement and bending over. Patient was taking hydrocodone on with mild relief. Patient denies any injuries or trauma. She denies any abdominal pain or urinary symptoms. No fevers, chills, night sweats, weight loss, IVDU, history of malignancy. No loss of control of bladder or bowel. No numbness/tingling, weakness or saddle anesthesia.    Past Medical History  Diagnosis Date  . Ectopic pregnancy   . Urinary tract infection   . Ovarian cyst   . Hypertension     was told, but never on meds  . Dysrhythmia     hx of a-fib  . Infection     UTI  . Depression     ok now  . Back pain, chronic    Past Surgical History  Procedure Laterality Date  . Etopical surgery    . Cleft palate repair    . Laparoscopy for ectopic pregnancy  2011   Family History  Problem Relation Age of Onset  . Other Neg Hx   . Hearing loss Neg Hx   . Hypertension Mother   . Hypertension Father   . Cancer Sister   . Heart disease Maternal Grandmother   . Cancer Maternal Grandfather    History  Substance Use Topics  . Smoking status: Former Smoker -- 0.25 packs/day for 15 years    Types: Cigarettes    Quit date: 10/02/2014  . Smokeless tobacco: Never Used  . Alcohol Use: Yes     Comment: OCCASIONAL   OB History    Gravida Para Term Preterm AB TAB SAB Ectopic Multiple Living   2    2 0 1 1 0 0     Review of Systems  Constitutional: Negative for fever and chills.  Gastrointestinal: Negative for nausea, vomiting and  abdominal pain.  Genitourinary: Negative for dysuria and urgency.  Musculoskeletal: Positive for back pain. Negative for gait problem.  Neurological: Negative for weakness and numbness.      Allergies  Flexeril  Home Medications   Prior to Admission medications   Medication Sig Start Date End Date Taking? Authorizing Provider  amoxicillin (AMOXIL) 500 MG capsule Take 1 capsule (500 mg total) by mouth 3 (three) times daily. Patient not taking: Reported on 12/07/2014 10/23/14   Joni ReiningNicole Pisciotta, PA-C  cyclobenzaprine (FLEXERIL) 10 MG tablet Take 1 tablet (10 mg total) by mouth at bedtime. Patient not taking: Reported on 12/07/2014 09/01/14   Doris Cheadleeepak Advani, MD  diazepam (VALIUM) 5 MG tablet Take 1 tablet (5 mg total) by mouth 2 (two) times daily. 02/02/15   Louann SjogrenVictoria L Hula Tasso, PA-C  divalproex (DEPAKOTE ER) 500 MG 24 hr tablet Take 2 tablets (1,000 mg total) by mouth at bedtime. For mood stabilization. Patient not taking: Reported on 12/07/2014 08/22/13   Verne SpurrNeil Mashburn, PA-C  guaiFENesin (ROBITUSSIN) 100 MG/5ML liquid Take 5-10 mLs (100-200 mg total) by mouth every 4 (four) hours as needed for cough. Patient not taking: Reported on 12/07/2014 07/26/14   Fayrene HelperBowie Tran, PA-C  HYDROcodone-acetaminophen (NORCO/VICODIN) 5-325 MG per tablet Take 1-2 tablets  by mouth every 6 hours as needed for pain. Patient not taking: Reported on 12/07/2014 10/23/14   Joni Reining Pisciotta, PA-C  ibuprofen (ADVIL,MOTRIN) 600 MG tablet Take 1 tablet (600 mg total) by mouth every 8 (eight) hours as needed. Patient taking differently: Take 600 mg by mouth every 8 (eight) hours as needed for moderate pain.  09/01/14   Doris Cheadle, MD  methocarbamol (ROBAXIN) 750 MG tablet Take 1 tablet (750 mg total) by mouth every 6 (six) hours as needed for muscle spasms. 12/07/14   Trixie Dredge, PA-C  metoCLOPramide (REGLAN) 10 MG tablet Take 1 tablet (10 mg total) by mouth every 6 (six) hours. Patient not taking: Reported on 12/07/2014 10/12/14   Earle Gell Cartner, PA-C  neomycin-polymyxin-hydrocortisone (CORTISPORIN) otic solution Place 4 drops into the right ear 4 (four) times daily. X 5-7 days while symptoms present 12/07/14   Trixie Dredge, PA-C  promethazine (PHENERGAN) 25 MG tablet Take 1 tablet (25 mg total) by mouth every 8 (eight) hours as needed for nausea or vomiting. Patient not taking: Reported on 12/07/2014 06/16/14   Jamesetta Orleans Lawyer, PA-C   BP 129/80 mmHg  Pulse 78  Temp(Src) 98.2 F (36.8 C) (Oral)  Resp 16  SpO2 100% Physical Exam  Constitutional: She appears well-developed and well-nourished. No distress.  HENT:  Head: Normocephalic and atraumatic.  Eyes: Conjunctivae are normal. Right eye exhibits no discharge. Left eye exhibits no discharge.  Cardiovascular: Normal rate, regular rhythm and normal heart sounds.   Pulmonary/Chest: Effort normal and breath sounds normal. No respiratory distress. She has no wheezes.  Abdominal: Soft. Bowel sounds are normal. She exhibits no distension. There is no tenderness.  Musculoskeletal:  No midline back tenderness, step off or crepitus. Right sided lower back tenderness. No CVA tenderness.  Neurological: She is alert. Coordination normal.  Equal muscle tone. 5/5 strength in lower extremities. DTR equal and intact. Negative straight leg test. Normal gait.  Skin: Skin is warm and dry. She is not diaphoretic.  Nursing note and vitals reviewed.   ED Course  Procedures (including critical care time) Labs Review Labs Reviewed - No data to display  Imaging Review No results found.   EKG Interpretation None      MDM   Final diagnoses:  Right-sided low back pain without sciatica   Patient with history of chronic back pain presenting with acute exacerbation of back pain. No loss of bowel or bladder control. No saddle anesthesia. No fever, night sweats, weight loss, h/o cancer, IVDU. VSS. No neurological deficits and normal neuro exam. Patient ambulatory. No concern for cauda  equina.  RICE protocol and valium indicated and discussed with patient. Driving and sedation precautions provided. Patient is afebrile, nontoxic, and in no acute distress. Patient is appropriate for outpatient management and is stable for discharge. Pt to follow up with PCP for persistent symptoms  Discussed return precautions with patient. Discussed all results and patient verbalizes understanding and agrees with plan.   Louann Sjogren, PA-C 02/02/15 1037  Kristen N Ward, DO 02/02/15 1459

## 2015-02-02 NOTE — Discharge Instructions (Signed)
Return to the emergency room with worsening of symptoms, new symptoms or with symptoms that are concerning , especially fevers, loss of control of bladder or bowels, numbness or tingling around genital region or anus, weakness. RICE: Rest, Ice (three cycles of 20 mins on, 78mns off at least twice a day), compression/brace, elevation. Heating pad works well for back pain. Ibuprofen 4029m(2 tablets 20013mevery 5-6 hours for 3-5 days. Valium for severe pain. Do not operate machinery, drive or drink alcohol while taking valium. Follow up with PCP if symptoms worsen or are persistent. Read below information and follow recommendations.  Back Injury Prevention Back injuries can be extremely painful and difficult to heal. After having one back injury, you are much more likely to experience another later on. It is important to learn how to avoid injuring or re-injuring your back. The following tips can help you to prevent a back injury. PHYSICAL FITNESS  Exercise regularly and try to develop good tone in your abdominal muscles. Your abdominal muscles provide a lot of the support needed by your back.  Do aerobic exercises (walking, jogging, biking, swimming) regularly.  Do exercises that increase balance and strength (tai chi, yoga) regularly. This can decrease your risk of falling and injuring your back.  Stretch before and after exercising.  Maintain a healthy weight. The more you weigh, the more stress is placed on your back. For every pound of weight, 10 times that amount of pressure is placed on the back. DIET  Talk to your caregiver about how much calcium and vitamin D you need per day. These nutrients help to prevent weakening of the bones (osteoporosis). Osteoporosis can cause broken (fractured) bones that lead to back pain.  Include good sources of calcium in your diet, such as dairy products, green, leafy vegetables, and products with calcium added (fortified).  Include good sources of  vitamin D in your diet, such as milk and foods that are fortified with vitamin D.  Consider taking a nutritional supplement or a multivitamin if needed.  Stop smoking if you smoke. POSTURE  Sit and stand up straight. Avoid leaning forward when you sit or hunching over when you stand.  Choose chairs with good low back (lumbar) support.  If you work at a desk, sit close to your work so you do not need to lean over. Keep your chin tucked in. Keep your neck drawn back and elbows bent at a right angle. Your arms should look like the letter "L."  Sit high and close to the steering wheel when you drive. Add a lumbar support to your car seat if needed.  Avoid sitting or standing in one position for too long. Take breaks to get up, stretch, and walk around at least once every hour. Take breaks if you are driving for long periods of time.  Sleep on your side with your knees slightly bent, or sleep on your back with a pillow under your knees. Do not sleep on your stomach. LIFTING, TWISTING, AND REACHING  Avoid heavy lifting, especially repetitive lifting. If you must do heavy lifting:  Stretch before lifting.  Work slowly.  Rest between lifts.  Use carts and dollies to move objects when possible.  Make several small trips instead of carrying 1 heavy load.  Ask for help when you need it.  Ask for help when moving big, awkward objects.  Follow these steps when lifting:  Stand with your feet shoulder-width apart.  Get as close to the object as you  can. Do not try to pick up heavy objects that are far from your body.  Use handles or lifting straps if they are available.  Bend at your knees. Squat down, but keep your heels off the floor.  Keep your shoulders pulled back, your chin tucked in, and your back straight.  Lift the object slowly, tightening the muscles in your legs, abdomen, and buttocks. Keep the object as close to the center of your body as possible.  When you put a load  down, use these same guidelines in reverse.  Do not:  Lift the object above your waist.  Twist at the waist while lifting or carrying a load. Move your feet if you need to turn, not your waist.  Bend over without bending at your knees.  Avoid reaching over your head, across a table, or for an object on a high surface. OTHER TIPS  Avoid wet floors and keep sidewalks clear of ice to prevent falls.  Do not sleep on a mattress that is too soft or too hard.  Keep items that are used frequently within easy reach.  Put heavier objects on shelves at waist level and lighter objects on lower or higher shelves.  Find ways to decrease your stress, such as exercise, massage, or relaxation techniques. Stress can build up in your muscles. Tense muscles are more vulnerable to injury.  Seek treatment for depression or anxiety if needed. These conditions can increase your risk of developing back pain. SEEK MEDICAL CARE IF:  You injure your back.  You have questions about diet, exercise, or other ways to prevent back injuries. MAKE SURE YOU:  Understand these instructions.  Will watch your condition.  Will get help right away if you are not doing well or get worse. Document Released: 12/29/2004 Document Revised: 02/13/2012 Document Reviewed: 01/02/2012 [facility] Patient Information 2015 [place], [place]. This information is not intended to replace advice given to you by your health care provider. Make sure you discuss any questions you have with your health care provider.

## 2015-02-02 NOTE — ED Notes (Addendum)
Pt states back pain starting this morning. Denies trauma or increased activity.  No change in urination.  No increase frequency or burning with urination.  States pain is more central location to back.  Pain is worse with movement

## 2015-02-16 ENCOUNTER — Other Ambulatory Visit (INDEPENDENT_AMBULATORY_CARE_PROVIDER_SITE_OTHER): Payer: Self-pay | Admitting: Surgery

## 2015-02-16 NOTE — H&P (Signed)
[name] 02/16/2015 1:31 PM [facility]: [facility] Patient #: [id number] DOB: May 23, 1981 Single / Language: [place] / Race: Black or African American Female History of Present Illness Breanna Sportsman MD; 02/16/2015 2:48 PM) Patient words: hernia.  The patient is a 34 year old female who presents with an incisional hernia. Patient sent by her primary care doctor, Dr. Renaye Graves, for concern of ventral wall hernia Pleasant overweight female. Noticed a bulge above her belly button. It is worsened over the past few years. It is now stuck all and out all the time. Its become more tender. She is noticed worsening discomfort and pressure after eating or with activity. She has a job with moderate amount of lifting. She normally has bowel movement every day. Occasionally looser regular. Occasionally constipated. She can walk about 15 minutes before having to stop secondary to feet soreness. She used to smoke a quarter of a pack a day but has remained abstinent for the past 6 months. No abdominal surgeries. No history of cardiac or pulmonary issues. Did recently have a bad muscle pull and back pain from a fall last month. That is getting better. Some question of bipolar and anxiety issues. That is stable. Other Problems [name], CMA; 02/16/2015 1:32 PM) Anxiety Disorder Atrial Fibrillation Back Pain Depression Gastroesophageal Reflux Disease Heart murmur Migraine Headache Ventral Hernia Repair  Past Surgical History [name], CMA; 02/16/2015 1:32 PM) Oral Surgery  Diagnostic Studies History [name], CMA; 02/16/2015 1:32 PM) Colonoscopy never Mammogram never Pap Smear 1-5 years ago  Medication History [name], CMA; 02/16/2015 1:34 PM) Cyclobenzaprine HCl (  Tablet, Oral) Active. Reglan (  Tablet, Oral) Active. Methocarbamol (  Tablet, Oral) Active. Depakote ER (  Tablet ER 24HR, Oral) Active. Valium (   Tablet, Oral) Active. Flexeril (  Tablet, Oral) Active. Medications Reconciled  Social History [name], CMA; 02/16/2015 1:32 PM) No alcohol use No caffeine use No drug use Tobacco use Current some day smoker.  Family History [name], CMA; 02/16/2015 1:32 PM) Arthritis Mother. Hypertension Father, Mother.  Pregnancy / Birth History [name], CMA; 02/16/2015 1:32 PM) Age at menarche 12 years. Contraceptive History Oral contraceptives. Gravida 2 Irregular periods Maternal age 58-25 Para 0     Review of Systems [name] CMA; 02/16/2015 1:32 PM) General Not Present- Appetite Loss, Chills, Fatigue, Fever, Night Sweats, Weight Gain and Weight Loss. Skin Not Present- Change in Wart/Mole, Dryness, Hives, Jaundice, New Lesions, Non-Healing Wounds, Rash and Ulcer. HEENT Present- Seasonal Allergies and Wears glasses/contact lenses. Not Present- Earache, Hearing Loss, Hoarseness, Nose Bleed, Oral Ulcers, Ringing in the Ears, Sinus Pain, Sore Throat, Visual Disturbances and Yellow Eyes. Respiratory Not Present- Bloody sputum, Chronic Cough, Difficulty Breathing, Snoring and Wheezing. Breast Not Present- Breast Mass, Breast Pain, Nipple Discharge and Skin Changes. Cardiovascular Not Present- Chest Pain, Difficulty Breathing Lying Down, Leg Cramps, Palpitations, Rapid Heart Rate, Shortness of Breath and Swelling of Extremities. Gastrointestinal Present- Abdominal Pain and Constipation. Not Present- Bloating, Bloody Stool, Change in Bowel Habits, Chronic diarrhea, Difficulty Swallowing, Excessive gas, Gets full quickly at meals, Hemorrhoids, Indigestion, Nausea, Rectal Pain and Vomiting. Female Genitourinary Not Present- Frequency, Nocturia, Painful Urination, Pelvic Pain and Urgency. Musculoskeletal Present- Back Pain. Not Present- Joint Pain, Joint Stiffness, Muscle Pain, Muscle Weakness and Swelling of Extremities. Neurological Not Present- Decreased Memory,  Fainting, Headaches, Numbness, Seizures, Tingling, Tremor, Trouble walking and Weakness. Psychiatric Not Present- Anxiety, Bipolar, Change in Sleep Pattern, Depression, Fearful and Frequent crying. Endocrine Not Present- Cold Intolerance, Excessive Hunger, Hair  Changes, Heat Intolerance, Hot flashes and New Diabetes. Hematology Not Present- Easy Bruising, Excessive bleeding, Gland problems, HIV and Persistent Infections.  Vitals ([name] CMA; 02/16/2015 1:33 PM) 02/16/2015 1:32 PM Weight: 134 lb Height: 60in Body Surface Area: 1.6 m Body Mass Index: 26.17 kg/m Temp.: 97.47F(Temporal)  Pulse: 81 (Regular)  BP: 124/84 (Sitting, Left Arm, Standard)     Physical Exam Breanna Sportsman MD; 02/16/2015 2:47 PM)  General Mental Status-Alert. General Appearance-Not in acute distress, Not Sickly. Orientation-Oriented X3. Hydration-Well hydrated. Voice-Normal.  Integumentary Global Assessment Upon inspection and palpation of skin surfaces of the - Axillae: non-tender, no inflammation or ulceration, no drainage. and Distribution of scalp and body hair is normal. General Characteristics Temperature - normal warmth is noted.  Head and Neck Head-normocephalic, atraumatic with no lesions or palpable masses. Face Global Assessment - atraumatic, no absence of expression. Neck Global Assessment - no abnormal movements, no bruit auscultated on the right, no bruit auscultated on the left, no decreased range of motion, non-tender. Trachea-midline. Thyroid Gland Characteristics - non-tender.  Eye Eyeball - Left-Extraocular movements intact, No Nystagmus. Eyeball - Right-Extraocular movements intact, No Nystagmus. Cornea - Left-No Hazy. Cornea - Right-No Hazy. Sclera/Conjunctiva - Left-No scleral icterus, No Discharge. Sclera/Conjunctiva - Right-No scleral icterus, No Discharge. Pupil - Left-Direct reaction to light normal. Pupil - Right-Direct  reaction to light normal.  ENMT Ears Pinna - Left - no drainage observed, no generalized tenderness observed. Right - no drainage observed, no generalized tenderness observed. Nose and Sinuses External Inspection of the Nose - no destructive lesion observed. Inspection of the nares - Left - quiet respiration. Right - quiet respiration. Mouth and Throat Lips - Upper Lip - no fissures observed, no pallor noted. Lower Lip - no fissures observed, no pallor noted. Nasopharynx - no discharge present. Oral Cavity/Oropharynx - Tongue - no dryness observed. Oral Mucosa - no cyanosis observed. Hypopharynx - no evidence of airway distress observed.  Chest and Lung Exam Inspection Movements - Normal and Symmetrical. Accessory muscles - No use of accessory muscles in breathing. Palpation Palpation of the chest reveals - Non-tender. Auscultation Breath sounds - Normal and Clear.  Cardiovascular Auscultation Rhythm - Regular. Murmurs & Other Heart Sounds - Auscultation of the heart reveals - No Murmurs and No Systolic Clicks.  Abdomen Inspection Inspection of the abdomen reveals - No Visible peristalsis and No Abnormal pulsations. Umbilicus - No Bleeding, No Urine drainage. Palpation/Percussion Palpation and Percussion of the abdomen reveal - Soft, Non Tender, No Rebound tenderness, No Rigidity (guarding) and No Cutaneous hyperesthesia. Note: Obese but soft. Obvious supraumbilical mass. 4 x 4 x 4 cm. Not reducible. No diastases.   Female Genitourinary Sexual Maturity Tanner 5 - Adult hair pattern. Note: No vaginal bleeding nor discharge. No lymphadenopathy. No inguinal hernias   Peripheral Vascular Upper Extremity Inspection - Left - No Cyanotic nailbeds, Not Ischemic. Right - No Cyanotic nailbeds, Not Ischemic.  Neurologic Neurologic evaluation reveals -normal attention span and ability to concentrate, able to name objects and repeat phrases. Appropriate fund of knowledge , normal  sensation and normal coordination. Mental Status Affect - not angry, not paranoid. Cranial Nerves-Normal Bilaterally. Gait-Normal.  Neuropsychiatric Mental status exam performed with findings of-able to articulate well with normal speech/language, rate, volume and coherence, thought content normal with ability to perform basic computations and apply abstract reasoning and no evidence of hallucinations, delusions, obsessions or homicidal/suicidal ideation.  Musculoskeletal Global Assessment Spine, Ribs and Pelvis - no instability, subluxation or laxity. Right Upper Extremity -  no instability, subluxation or laxity.  Lymphatic Head & Neck  General Head & Neck Lymphatics: Bilateral - Description - No Localized lymphadenopathy. Axillary  General Axillary Region: Bilateral - Description - No Localized lymphadenopathy. Femoral & Inguinal  Generalized Femoral & Inguinal Lymphatics: Left - Description - No Localized lymphadenopathy. Right - Description - No Localized lymphadenopathy.    Assessment & Plan [name](Cherly Erno C. Geonna Lockyer MD; 02/16/2015 2:48 PM)  Dorethea ClanINCARCERATED VENTRAL HERNIA (552.20  K46.0) Impression: Incarcerated supraumbilical ventral wall hernia. I think she would benefit from surgical repair. She agrees. Given her obesity and size, would plan to underlay mesh repair. Perhaps primary repair over the mesh  Current Plans Schedule for Surgery Written instructions provided Discussed regular exercise with patient. Pt Education - [facility] Hernia Post-Op HCI (Kloee Ballew): discussed with patient and provided information. Pt Education - [facility] Pain Control (Theophilus Walz)  Breanna SportsmanSteven C. Dorothey Oetken, M.D., F.A.C.S. Gastrointestinal and Minimally Invasive Surgery [facility] [address] [place], [place] [id number] [phone] Main / Paging

## 2015-03-19 ENCOUNTER — Encounter (HOSPITAL_COMMUNITY): Payer: Self-pay

## 2015-03-19 NOTE — Patient Instructions (Signed)
Alcus DadBriget O Buonocore  03/19/2015   Your procedure is scheduled on:    03/26/2015    [facility]  [facility] and follow signs to               Short Stay Center at      0730 AM.  Call this number if you have problems the morning of surgery [phone]   Remember:  Do not eat food or drink liquids :After Midnight.     Take these medicines the morning of surgery with A SIP OF WATER:  Flonase nasal spray                                You may not have any metal on your body including hair pins and              piercings  Do not wear jewelry, make-up, lotions, powders or perfumes., deodorant.               Do not wear nail polish.  Do not shave  48 hours prior to surgery.     Do not bring valuables to the hospital. [facility] IS NOT             RESPONSIBLE   FOR VALUABLES.  Contacts, dentures or bridgework may not be worn into surgery.      Patients discharged the day of surgery will not be allowed to drive home.  Name and phone number of your driver:  Special Instructions: coughing and deep breathing exercises, leg exercises               Please read over the following fact sheets you were given: _____________________________________________________________________             [facility] Pre[facility] Before surgery, you can play an important role.  Because skin is not sterile, your skin needs to be as free of germs as possible.  You can reduce the number of germs on your skin by washing with CHG (chlorahexidine gluconate) soap before surgery.  CHG is an antiseptic cleaner which kills germs and bonds with the skin to continue killing germs even after washing. Please DO NOT use if you have an allergy to CHG or antibacterial soaps.  If your skin becomes reddened/irritated stop using the CHG and inform your nurse when you arrive at Short Stay. Do not shave (including legs and underarms) for at least 48 hours prior to the first CHG shower.   You may shave your face/neck. Please follow these instructions carefully:  1.  Shower with CHG Soap the night before surgery and the  morning of Surgery.  2.  If you choose to wash your hair, wash your hair first as usual with your  normal  shampoo.  3.  After you shampoo, rinse your hair and body thoroughly to remove the  shampoo.                           4.  Use CHG as you would any other liquid soap.  You can apply chg directly  to the skin and wash                       Gently with a scrungie or clean washcloth.  5.  Apply the CHG Soap to your body ONLY FROM THE NECK DOWN.   Do not use on face/ open                           Wound or open sores. Avoid contact with eyes, ears mouth and genitals (private parts).                       Wash face,  Genitals (private parts) with your normal soap.             6.  Wash thoroughly, paying special attention to the area where your surgery  will be performed.  7.  Thoroughly rinse your body with warm water from the neck down.  8.  DO NOT shower/wash with your normal soap after using and rinsing off  the CHG Soap.                9.  Pat yourself dry with a clean towel.            10.  Wear clean pajamas.            11.  Place clean sheets on your bed the night of your first shower and do not  sleep with pets. Day of Surgery : Do not apply any lotions/deodorants the morning of surgery.  Please wear clean clothes to the hospital/surgery center.  FAILURE TO FOLLOW THESE INSTRUCTIONS MAY RESULT IN THE CANCELLATION OF YOUR SURGERY PATIENT SIGNATURE_________________________________  NURSE SIGNATURE__________________________________  ________________________________________________________________________

## 2015-03-20 ENCOUNTER — Encounter (HOSPITAL_COMMUNITY)
Admission: RE | Admit: 2015-03-20 | Discharge: 2015-03-20 | Disposition: A | Discharge status: Home / Self Care | Payer: 59 | Source: Ambulatory Visit | Attending: Surgery | Admitting: Surgery

## 2015-03-20 ENCOUNTER — Encounter (HOSPITAL_COMMUNITY): Payer: Self-pay

## 2015-03-20 DIAGNOSIS — Z01812 Encounter for preprocedural laboratory examination: Secondary | ICD-10-CM | POA: Diagnosis not present

## 2015-03-20 DIAGNOSIS — Z01810 Encounter for preprocedural cardiovascular examination: Secondary | ICD-10-CM | POA: Insufficient documentation

## 2015-03-20 HISTORY — DX: Headache, unspecified: R51.9

## 2015-03-20 HISTORY — DX: Headache: R51

## 2015-03-20 HISTORY — DX: Gastro-esophageal reflux disease without esophagitis: K21.9

## 2015-03-20 LAB — BASIC METABOLIC PANEL
Anion gap: 11 (ref 5–15)
BUN: 11 mg/dL (ref 6–23)
CALCIUM: 9.3 mg/dL (ref 8.4–10.5)
CO2: 22 mmol/L (ref 19–32)
CREATININE: 0.51 mg/dL (ref 0.50–1.10)
Chloride: 105 mmol/L (ref 96–112)
GFR calc Af Amer: >90 mL/min (ref >90)
GLUCOSE: 87 mg/dL (ref 70–99)
Potassium: 3.9 mmol/L (ref 3.5–5.1)
SODIUM: 138 mmol/L (ref 135–145)

## 2015-03-20 LAB — CBC
HEMATOCRIT: 43.1 % (ref 36.0–46.0)
HEMOGLOBIN: 14.7 g/dL (ref 12.0–15.0)
MCH: 31.3 pg (ref 26.0–34.0)
MCHC: 34.1 g/dL (ref 30.0–36.0)
MCV: 91.9 fL (ref 78.0–100.0)
Platelets: 302 10*3/uL (ref 150–400)
RBC: 4.69 MIL/uL (ref 3.87–5.11)
RDW: 13.8 % (ref 11.5–15.5)
WBC: 8.9 10*3/uL (ref 4.0–10.5)

## 2015-03-20 LAB — HCG, SERUM, QUALITATIVE: Preg, Serum: NEGATIVE

## 2015-03-20 NOTE — Progress Notes (Signed)
CXR- 10/23/14 EPIC  ECHO 01/14/2013 EPIC

## 2015-03-20 NOTE — Progress Notes (Signed)
CXR 10/23/14 EPIC  ECHO- 01/14/13 EPIC

## 2015-03-23 NOTE — Progress Notes (Signed)
Final EKG done 03/20/2015 in EPIC.   

## 2015-03-26 ENCOUNTER — Ambulatory Visit (HOSPITAL_COMMUNITY): Payer: 59 | Admitting: Certified Registered"

## 2015-03-26 ENCOUNTER — Encounter (HOSPITAL_COMMUNITY): Payer: Self-pay | Admitting: Certified Registered"

## 2015-03-26 ENCOUNTER — Ambulatory Visit (HOSPITAL_COMMUNITY)
Admission: RE | Admit: 2015-03-26 | Discharge: 2015-03-26 | Disposition: A | Discharge status: Home / Self Care | Payer: 59 | Source: Ambulatory Visit | Attending: Surgery | Admitting: Surgery

## 2015-03-26 ENCOUNTER — Encounter (HOSPITAL_COMMUNITY)
Admission: RE | Disposition: A | Discharge status: Home / Self Care | Payer: Self-pay | Source: Ambulatory Visit | Attending: Surgery

## 2015-03-26 DIAGNOSIS — Z87891 Personal history of nicotine dependence: Secondary | ICD-10-CM

## 2015-03-26 DIAGNOSIS — I10 Essential (primary) hypertension: Secondary | ICD-10-CM | POA: Insufficient documentation

## 2015-03-26 DIAGNOSIS — K436 Other and unspecified ventral hernia with obstruction, without gangrene: Secondary | ICD-10-CM | POA: Insufficient documentation

## 2015-03-26 DIAGNOSIS — Z6824 Body mass index (BMI) 24.0-24.9, adult: Secondary | ICD-10-CM | POA: Insufficient documentation

## 2015-03-26 DIAGNOSIS — K219 Gastro-esophageal reflux disease without esophagitis: Secondary | ICD-10-CM

## 2015-03-26 DIAGNOSIS — M62830 Muscle spasm of back: Secondary | ICD-10-CM

## 2015-03-26 DIAGNOSIS — M549 Dorsalgia, unspecified: Secondary | ICD-10-CM | POA: Diagnosis present

## 2015-03-26 DIAGNOSIS — I4891 Unspecified atrial fibrillation: Secondary | ICD-10-CM | POA: Insufficient documentation

## 2015-03-26 DIAGNOSIS — F419 Anxiety disorder, unspecified: Secondary | ICD-10-CM | POA: Diagnosis present

## 2015-03-26 DIAGNOSIS — F329 Major depressive disorder, single episode, unspecified: Secondary | ICD-10-CM | POA: Diagnosis present

## 2015-03-26 DIAGNOSIS — E669 Obesity, unspecified: Secondary | ICD-10-CM | POA: Insufficient documentation

## 2015-03-26 DIAGNOSIS — Z79899 Other long term (current) drug therapy: Secondary | ICD-10-CM

## 2015-03-26 DIAGNOSIS — K429 Umbilical hernia without obstruction or gangrene: Secondary | ICD-10-CM

## 2015-03-26 DIAGNOSIS — Z888 Allergy status to other drugs, medicaments and biological substances status: Secondary | ICD-10-CM

## 2015-03-26 DIAGNOSIS — K913 Postprocedural intestinal obstruction: Principal | ICD-10-CM | POA: Diagnosis present

## 2015-03-26 DIAGNOSIS — Z87730 Personal history of (corrected) cleft lip and palate: Secondary | ICD-10-CM

## 2015-03-26 DIAGNOSIS — G8929 Other chronic pain: Secondary | ICD-10-CM | POA: Diagnosis present

## 2015-03-26 DIAGNOSIS — R112 Nausea with vomiting, unspecified: Secondary | ICD-10-CM | POA: Diagnosis not present

## 2015-03-26 HISTORY — PX: VENTRAL HERNIA REPAIR: SHX424

## 2015-03-26 SURGERY — REPAIR, HERNIA, VENTRAL, LAPAROSCOPIC
Anesthesia: General | Site: Abdomen

## 2015-03-26 MED ORDER — SUCCINYLCHOLINE CHLORIDE 20 MG/ML IJ SOLN
INTRAMUSCULAR | Status: DC | PRN
  Administered 2015-03-26: 100 mg via INTRAVENOUS

## 2015-03-26 MED ORDER — ESMOLOL HCL 10 MG/ML IV SOLN
INTRAVENOUS | Status: DC | PRN
  Administered 2015-03-26 (×3): 10 mg via INTRAVENOUS

## 2015-03-26 MED ORDER — ONDANSETRON HCL 4 MG/2ML IJ SOLN
INTRAMUSCULAR | Status: AC
  Filled 2015-03-26: qty 2

## 2015-03-26 MED ORDER — DEXAMETHASONE SODIUM PHOSPHATE 10 MG/ML IJ SOLN
INTRAMUSCULAR | Status: DC | PRN
  Administered 2015-03-26: 10 mg via INTRAVENOUS

## 2015-03-26 MED ORDER — ESMOLOL HCL 10 MG/ML IV SOLN
INTRAVENOUS | Status: AC
  Filled 2015-03-26: qty 10

## 2015-03-26 MED ORDER — CEFAZOLIN SODIUM-DEXTROSE 2-3 GM-% IV SOLR
INTRAVENOUS | Status: AC
  Filled 2015-03-26: qty 50

## 2015-03-26 MED ORDER — MIDAZOLAM HCL 2 MG/2ML IJ SOLN
INTRAMUSCULAR | Status: AC
  Filled 2015-03-26: qty 2

## 2015-03-26 MED ORDER — 0.9 % SODIUM CHLORIDE (POUR BTL) OPTIME
TOPICAL | Status: DC | PRN
  Administered 2015-03-26: 1000 mL

## 2015-03-26 MED ORDER — ONDANSETRON HCL 4 MG/2ML IJ SOLN
INTRAMUSCULAR | Status: DC | PRN
  Administered 2015-03-26: 4 mg via INTRAVENOUS

## 2015-03-26 MED ORDER — NEOSTIGMINE METHYLSULFATE 10 MG/10ML IV SOLN
INTRAVENOUS | Status: DC | PRN
  Administered 2015-03-26: 3 mg via INTRAVENOUS

## 2015-03-26 MED ORDER — BUPIVACAINE-EPINEPHRINE (PF) 0.25% -1:200000 IJ SOLN
INTRAMUSCULAR | Status: AC
  Filled 2015-03-26: qty 30

## 2015-03-26 MED ORDER — ROCURONIUM BROMIDE 100 MG/10ML IV SOLN
INTRAVENOUS | Status: DC | PRN
  Administered 2015-03-26: 30 mg via INTRAVENOUS

## 2015-03-26 MED ORDER — OXYCODONE HCL 5 MG PO TABS: 5.0000 mg | ORAL_TABLET | ORAL | Status: DC | PRN

## 2015-03-26 MED ORDER — CHLORHEXIDINE GLUCONATE 4 % EX LIQD: 1.0000 "application " | Freq: Once | CUTANEOUS | Status: DC

## 2015-03-26 MED ORDER — FENTANYL CITRATE (PF) 250 MCG/5ML IJ SOLN
INTRAMUSCULAR | Status: AC
  Filled 2015-03-26: qty 5

## 2015-03-26 MED ORDER — FENTANYL CITRATE (PF) 100 MCG/2ML IJ SOLN
INTRAMUSCULAR | Status: AC
  Filled 2015-03-26: qty 2

## 2015-03-26 MED ORDER — KETOROLAC TROMETHAMINE 30 MG/ML IJ SOLN
INTRAMUSCULAR | Status: DC | PRN
  Administered 2015-03-26: 30 mg via INTRAVENOUS

## 2015-03-26 MED ORDER — OXYCODONE HCL 5 MG PO TABS
5.0000 mg | ORAL_TABLET | Freq: Once | ORAL | Status: AC
  Administered 2015-03-26: 5 mg via ORAL
  Filled 2015-03-26: qty 1

## 2015-03-26 MED ORDER — LABETALOL HCL 5 MG/ML IV SOLN
INTRAVENOUS | Status: DC | PRN
  Administered 2015-03-26: 5 mg via INTRAVENOUS

## 2015-03-26 MED ORDER — MEPERIDINE HCL 50 MG/ML IJ SOLN: 6.2500 mg | INTRAMUSCULAR | Status: DC | PRN

## 2015-03-26 MED ORDER — FENTANYL CITRATE (PF) 100 MCG/2ML IJ SOLN
INTRAMUSCULAR | Status: DC | PRN
  Administered 2015-03-26: 50 ug via INTRAVENOUS
  Administered 2015-03-26: 100 ug via INTRAVENOUS
  Administered 2015-03-26 (×2): 50 ug via INTRAVENOUS

## 2015-03-26 MED ORDER — MIDAZOLAM HCL 5 MG/5ML IJ SOLN
INTRAMUSCULAR | Status: DC | PRN
  Administered 2015-03-26: 2 mg via INTRAVENOUS

## 2015-03-26 MED ORDER — ROCURONIUM BROMIDE 100 MG/10ML IV SOLN
INTRAVENOUS | Status: AC
Start: 2015-03-26 — End: 2015-03-26
  Filled 2015-03-26: qty 1

## 2015-03-26 MED ORDER — FENTANYL CITRATE (PF) 100 MCG/2ML IJ SOLN
25.0000 ug | INTRAMUSCULAR | Status: DC | PRN
  Administered 2015-03-26 (×2): 50 ug via INTRAVENOUS

## 2015-03-26 MED ORDER — IBUPROFEN 600 MG PO TABS: 600.0000 mg | ORAL_TABLET | Freq: Four times a day (QID) | ORAL | Status: DC | PRN

## 2015-03-26 MED ORDER — LACTATED RINGERS IV SOLN: INTRAVENOUS | Status: DC

## 2015-03-26 MED ORDER — PROPOFOL 10 MG/ML IV BOLUS
INTRAVENOUS | Status: DC | PRN
  Administered 2015-03-26: 150 mg via INTRAVENOUS

## 2015-03-26 MED ORDER — LABETALOL HCL 5 MG/ML IV SOLN
INTRAVENOUS | Status: AC
  Filled 2015-03-26: qty 4

## 2015-03-26 MED ORDER — GLYCOPYRROLATE 0.2 MG/ML IJ SOLN
INTRAMUSCULAR | Status: AC
  Filled 2015-03-26: qty 2

## 2015-03-26 MED ORDER — PROPOFOL 10 MG/ML IV BOLUS
INTRAVENOUS | Status: AC
  Filled 2015-03-26: qty 20

## 2015-03-26 MED ORDER — METHOCARBAMOL 750 MG PO TABS: 750.0000 mg | ORAL_TABLET | Freq: Four times a day (QID) | ORAL | Status: DC | PRN

## 2015-03-26 MED ORDER — LACTATED RINGERS IV SOLN
INTRAVENOUS | Status: DC | PRN
  Administered 2015-03-26 (×2): via INTRAVENOUS

## 2015-03-26 MED ORDER — DEXAMETHASONE SODIUM PHOSPHATE 10 MG/ML IJ SOLN
INTRAMUSCULAR | Status: AC
  Filled 2015-03-26: qty 1

## 2015-03-26 MED ORDER — GLYCOPYRROLATE 0.2 MG/ML IJ SOLN
INTRAMUSCULAR | Status: DC | PRN
  Administered 2015-03-26: 0.4 mg via INTRAVENOUS

## 2015-03-26 MED ORDER — BUPIVACAINE-EPINEPHRINE 0.25% -1:200000 IJ SOLN
INTRAMUSCULAR | Status: DC | PRN
  Administered 2015-03-26: 60 mL

## 2015-03-26 MED ORDER — LIDOCAINE HCL (CARDIAC) 20 MG/ML IV SOLN
INTRAVENOUS | Status: AC
  Filled 2015-03-26: qty 5

## 2015-03-26 MED ORDER — CEFAZOLIN SODIUM-DEXTROSE 2-3 GM-% IV SOLR
2.0000 g | INTRAVENOUS | Status: AC
  Administered 2015-03-26: 2 g via INTRAVENOUS

## 2015-03-26 MED ORDER — PROMETHAZINE HCL 25 MG/ML IJ SOLN: 6.2500 mg | INTRAMUSCULAR | Status: DC | PRN

## 2015-03-26 MED ORDER — HYDROMORPHONE HCL 2 MG/ML IJ SOLN
INTRAMUSCULAR | Status: AC
  Filled 2015-03-26: qty 1

## 2015-03-26 MED ORDER — BUPIVACAINE-EPINEPHRINE 0.25% -1:200000 IJ SOLN
INTRAMUSCULAR | Status: AC
  Filled 2015-03-26: qty 1

## 2015-03-26 MED ORDER — LIDOCAINE HCL (PF) 2 % IJ SOLN
INTRAMUSCULAR | Status: DC | PRN
  Administered 2015-03-26: 20 mg via INTRADERMAL

## 2015-03-26 MED ORDER — STERILE WATER FOR IRRIGATION IR SOLN
Status: DC | PRN
  Administered 2015-03-26: 1500 mL

## 2015-03-26 SURGICAL SUPPLY — 42 items
APPLIER CLIP 5 13 M/L LIGAMAX5 (MISCELLANEOUS)
APR CLP MED LRG 5 ANG JAW (MISCELLANEOUS)
BINDER ABDOMINAL 12 ML 46-62 (SOFTGOODS) ×1 IMPLANT
CABLE HIGH FREQUENCY MONO STRZ (ELECTRODE) ×2 IMPLANT
CATH KIT ON-Q SILVERSOAK 7.5 (CATHETERS) IMPLANT
CATH KIT ON-Q SILVERSOAK 7.5IN (CATHETERS) IMPLANT
CHLORAPREP W/TINT 26ML (MISCELLANEOUS) ×2 IMPLANT
CLIP APPLIE 5 13 M/L LIGAMAX5 (MISCELLANEOUS) IMPLANT
DECANTER SPIKE VIAL GLASS SM (MISCELLANEOUS) ×2 IMPLANT
DEVICE SECURE STRAP 25 ABSORB (INSTRUMENTS) ×1 IMPLANT
DEVICE TROCAR PUNCTURE CLOSURE (ENDOMECHANICALS) ×2 IMPLANT
DRAPE LAPAROSCOPIC ABDOMINAL (DRAPES) ×2 IMPLANT
DRAPE WARM FLUID 44X44 (DRAPE) ×2 IMPLANT
DRSG TEGADERM 2-3/8X2-3/4 SM (GAUZE/BANDAGES/DRESSINGS) ×6 IMPLANT
DRSG TEGADERM 4X4.75 (GAUZE/BANDAGES/DRESSINGS) ×1 IMPLANT
ELECT REM PT RETURN 9FT ADLT (ELECTROSURGICAL) ×2
ELECTRODE REM PT RTRN 9FT ADLT (ELECTROSURGICAL) ×1 IMPLANT
GAUZE SPONGE 2X2 8PLY STRL LF (GAUZE/BANDAGES/DRESSINGS) IMPLANT
GLOVE ECLIPSE 8.0 STRL XLNG CF (GLOVE) ×2 IMPLANT
GLOVE INDICATOR 8.0 STRL GRN (GLOVE) ×2 IMPLANT
GOWN STRL REUS W/TWL XL LVL3 (GOWN DISPOSABLE) ×5 IMPLANT
KIT BASIN OR (CUSTOM PROCEDURE TRAY) ×2 IMPLANT
MESH VENTRALIGHT ST 6X8 (Mesh Specialty) ×2 IMPLANT
MESH VENTRLGHT ELLIPSE 8X6XMFL (Mesh Specialty) IMPLANT
NDL SPNL 22GX3.5 QUINCKE BK (NEEDLE) IMPLANT
NEEDLE SPNL 22GX3.5 QUINCKE BK (NEEDLE) IMPLANT
PEN SKIN MARKING BROAD (MISCELLANEOUS) ×2 IMPLANT
SCISSORS LAP 5X35 DISP (ENDOMECHANICALS) ×2 IMPLANT
SET IRRIG TUBING LAPAROSCOPIC (IRRIGATION / IRRIGATOR) IMPLANT
SHEARS HARMONIC ACE PLUS 36CM (ENDOMECHANICALS) IMPLANT
SLEEVE XCEL OPT CAN 5 100 (ENDOMECHANICALS) ×4 IMPLANT
SPONGE GAUZE 2X2 STER 10/PKG (GAUZE/BANDAGES/DRESSINGS) ×1
STRIP CLOSURE SKIN 1/2X4 (GAUZE/BANDAGES/DRESSINGS) ×4 IMPLANT
SUT MNCRL AB 4-0 PS2 18 (SUTURE) ×2 IMPLANT
SUT PDS AB 1 CT1 27 (SUTURE) ×2 IMPLANT
SUT PROLENE 1 CT 1 30 (SUTURE) ×11 IMPLANT
TOWEL OR 17X26 10 PK STRL BLUE (TOWEL DISPOSABLE) ×2 IMPLANT
TRAY FOLEY W/METER SILVER 14FR (SET/KITS/TRAYS/PACK) IMPLANT
TRAY LAPAROSCOPIC (CUSTOM PROCEDURE TRAY) ×2 IMPLANT
TROCAR BLADELESS OPT 5 100 (ENDOMECHANICALS) ×2 IMPLANT
TROCAR XCEL NON-BLD 11X100MML (ENDOMECHANICALS) IMPLANT
TUNNELER SHEATH ON-Q 16GX12 DP (PAIN MANAGEMENT) IMPLANT

## 2015-03-26 NOTE — Anesthesia Preprocedure Evaluation (Addendum)
Anesthesia Evaluation  Patient identified by MRN, date of birth, ID band Patient awake    Reviewed: Allergy & Precautions, NPO status , Patient's Chart, lab work & pertinent test results  Airway Mallampati: II  TM Distance: >3 FB Neck ROM: Full    Dental no notable dental hx.    Pulmonary neg pulmonary ROS, former smoker,  breath sounds clear to auscultation  Pulmonary exam normal       Cardiovascular hypertension, + dysrhythmias Atrial Fibrillation Rhythm:Regular Rate:Normal     Neuro/Psych negative neurological ROS  negative psych ROS   GI/Hepatic negative GI ROS, Neg liver ROS,   Endo/Other  negative endocrine ROS  Renal/GU negative Renal ROS  negative genitourinary   Musculoskeletal negative musculoskeletal ROS (+)   Abdominal   Peds negative pediatric ROS (+)  Hematology negative hematology ROS (+)   Anesthesia Other Findings   Reproductive/Obstetrics negative OB ROS                            Anesthesia Physical Anesthesia Plan  ASA: III  Anesthesia Plan: General   Post-op Pain Management:    Induction: Intravenous  Airway Management Planned: Oral ETT  Additional Equipment:   Intra-op Plan:   Post-operative Plan: Extubation in OR  Informed Consent: I have reviewed the patients History and Physical, chart, labs and discussed the procedure including the risks, benefits and alternatives for the proposed anesthesia with the patient or authorized representative who has indicated his/her understanding and acceptance.   Dental advisory given  Plan Discussed with: CRNA  Anesthesia Plan Comments:         Anesthesia Quick Evaluation

## 2015-03-26 NOTE — Interval H&P Note (Signed)
History and Physical Interval Note:  03/26/2015 9:49 AM  [name]  has presented today for surgery, with the diagnosis of Incarcerated Ventral Wall Hernia  The various methods of treatment have been discussed with the patient and family. After consideration of risks, benefits and other options for treatment, the patient has consented to  Procedure(s): LAPAROSCOPIC VENTRAL HERNIA (N/A) as a surgical intervention .  The patient's history has been reviewed, patient examined, no change in status, stable for surgery.  I have reviewed the patient's chart and labs.  Questions were answered to the patient's satisfaction.     [name] C.

## 2015-03-26 NOTE — H&P (Signed)
[name]  Location: [facility] Patient #: [id number] DOB: [birth year] Single / Language: [place] / Race: Black or African American Female  History of Present Illness  Patient words: hernia.  The patient is a 34 year old female who presents with an incisional hernia. Patient sent by her primary care doctor, Dr. Renaye Rakers, for concern of ventral wall hernia Pleasant overweight female. Noticed a bulge above her belly button. It is worsened over the past few years. It is now stuck all and out all the time. Its become more tender. She is noticed worsening discomfort and pressure after eating or with activity. She has a job with moderate amount of lifting. She normally has bowel movement every day. Occasionally looser regular. Occasionally constipated. She can walk about 15 minutes before having to stop secondary to feet soreness. She used to smoke a quarter of a pack a day but has remained abstinent for the past 6 months. No abdominal surgeries. No history of cardiac or pulmonary issues. Did recently have a bad muscle pull and back pain from a fall last month. That is getting better. Some question of bipolar and anxiety issues. That is stable.   Other Problems Lamar Laundry Bynum, CMA; 02/16/2015 1:32 PM) Anxiety Disorder Atrial Fibrillation Back Pain Depression Gastroesophageal Reflux Disease Heart murmur Migraine Headache Ventral Hernia Repair  Past Surgical History Gilmer Mor, CMA; 02/16/2015 1:32 PM) Oral Surgery  Diagnostic Studies History Gilmer Mor, CMA; 02/16/2015 1:32 PM) Colonoscopy never Mammogram never Pap Smear 1-5 years ago  Medication History Gilmer Mor, CMA; 02/16/2015 1:34 PM) Cyclobenzaprine HCl (  Tablet, Oral) Active. Reglan (  Tablet, Oral) Active. Methocarbamol (  Tablet, Oral) Active. Depakote ER (  Tablet ER 24HR, Oral) Active. Valium (  Tablet, Oral) Active. Flexeril (  Tablet, Oral)  Active. Medications Reconciled  Social History Gilmer Mor, CMA; 02/16/2015 1:32 PM) No alcohol use No caffeine use No drug use Tobacco use Current some day smoker.  Family History Gilmer Mor, CMA; 02/16/2015 1:32 PM) Arthritis Mother. Hypertension Father, Mother.  Pregnancy / Birth History Gilmer Mor, CMA; 02/16/2015 1:32 PM) Age at menarche 12 years. Contraceptive History Oral contraceptives. Gravida 2 Irregular periods Maternal age 9-25 Para 0  Review of Systems Lamar Laundry Bynum CMA; 02/16/2015 1:32 PM) General Not Present- Appetite Loss, Chills, Fatigue, Fever, Night Sweats, Weight Gain and Weight Loss. Skin Not Present- Change in Wart/Mole, Dryness, Hives, Jaundice, New Lesions, Non-Healing Wounds, Rash and Ulcer. HEENT Present- Seasonal Allergies and Wears glasses/contact lenses. Not Present- Earache, Hearing Loss, Hoarseness, Nose Bleed, Oral Ulcers, Ringing in the Ears, Sinus Pain, Sore Throat, Visual Disturbances and Yellow Eyes. Respiratory Not Present- Bloody sputum, Chronic Cough, Difficulty Breathing, Snoring and Wheezing. Breast Not Present- Breast Mass, Breast Pain, Nipple Discharge and Skin Changes. Cardiovascular Not Present- Chest Pain, Difficulty Breathing Lying Down, Leg Cramps, Palpitations, Rapid Heart Rate, Shortness of Breath and Swelling of Extremities. Gastrointestinal Present- Abdominal Pain and Constipation. Not Present- Bloating, Bloody Stool, Change in Bowel Habits, Chronic diarrhea, Difficulty Swallowing, Excessive gas, Gets full quickly at meals, Hemorrhoids, Indigestion, Nausea, Rectal Pain and Vomiting. Female Genitourinary Not Present- Frequency, Nocturia, Painful Urination, Pelvic Pain and Urgency. Musculoskeletal Present- Back Pain. Not Present- Joint Pain, Joint Stiffness, Muscle Pain, Muscle Weakness and Swelling of Extremities. Neurological Not Present- Decreased Memory, Fainting, Headaches, Numbness, Seizures, Tingling, Tremor,  Trouble walking and Weakness. Psychiatric Not Present- Anxiety, Bipolar, Change in Sleep Pattern, Depression, Fearful and Frequent crying. Endocrine Not Present- Cold Intolerance, Excessive Hunger, Hair Changes, Heat Intolerance, Hot flashes and New Diabetes.  Hematology Not Present- Easy Bruising, Excessive bleeding, Gland problems, HIV and Persistent Infections.   Vitals (Sonya Bynum CMA; 02/16/2015 1:33 PM) 02/16/2015 1:32 PM Weight: 134 lb Height: 60in Body Surface Area: 1.6 m Body Mass Index: 26.17 kg/m Temp.: 97.71F(Temporal)  Pulse: 81 (Regular)  BP: 124/84 (Sitting, Left Arm, Standard)    Physical Exam [name](Lanea Vankirk C. Corliss Coggeshall MD; 02/16/2015 2:47 PM) General Mental Status-Alert. General Appearance-Not in acute distress, Not Sickly. Orientation-Oriented X3. Hydration-Well hydrated. Voice-Normal.  Integumentary Global Assessment Upon inspection and palpation of skin surfaces of the - Axillae: non-tender, no inflammation or ulceration, no drainage. and Distribution of scalp and body hair is normal. General Characteristics Temperature - normal warmth is noted.  Head and Neck Head-normocephalic, atraumatic with no lesions or palpable masses. Face Global Assessment - atraumatic, no absence of expression. Neck Global Assessment - no abnormal movements, no bruit auscultated on the right, no bruit auscultated on the left, no decreased range of motion, non-tender. Trachea-midline. Thyroid Gland Characteristics - non-tender.  Eye Eyeball - Left-Extraocular movements intact, No Nystagmus. Eyeball - Right-Extraocular movements intact, No Nystagmus. Cornea - Left-No Hazy. Cornea - Right-No Hazy. Sclera/Conjunctiva - Left-No scleral icterus, No Discharge. Sclera/Conjunctiva - Right-No scleral icterus, No Discharge. Pupil - Left-Direct reaction to light normal. Pupil - Right-Direct reaction to light normal.  ENMT Ears Pinna - Left - no  drainage observed, no generalized tenderness observed. Right - no drainage observed, no generalized tenderness observed. Nose and Sinuses External Inspection of the Nose - no destructive lesion observed. Inspection of the nares - Left - quiet respiration. Right - quiet respiration. Mouth and Throat Lips - Upper Lip - no fissures observed, no pallor noted. Lower Lip - no fissures observed, no pallor noted. Nasopharynx - no discharge present. Oral Cavity/Oropharynx - Tongue - no dryness observed. Oral Mucosa - no cyanosis observed. Hypopharynx - no evidence of airway distress observed.  Chest and Lung Exam Inspection Movements - Normal and Symmetrical. Accessory muscles - No use of accessory muscles in breathing. Palpation Palpation of the chest reveals - Non-tender. Auscultation Breath sounds - Normal and Clear.  Cardiovascular Auscultation Rhythm - Regular. Murmurs & Other Heart Sounds - Auscultation of the heart reveals - No Murmurs and No Systolic Clicks.  Abdomen Inspection Inspection of the abdomen reveals - No Visible peristalsis and No Abnormal pulsations. Umbilicus - No Bleeding, No Urine drainage. Palpation/Percussion Palpation and Percussion of the abdomen reveal - Soft, Non Tender, No Rebound tenderness, No Rigidity (guarding) and No Cutaneous hyperesthesia. Note: Obese but soft. Obvious supraumbilical mass. 4 x 4 x 4 cm. Not reducible. No diastases.   Female Genitourinary Sexual Maturity Tanner 5 - Adult hair pattern. Note: No vaginal bleeding nor discharge. No lymphadenopathy. No inguinal hernias   Peripheral Vascular Upper Extremity Inspection - Left - No Cyanotic nailbeds, Not Ischemic. Right - No Cyanotic nailbeds, Not Ischemic.  Neurologic Neurologic evaluation reveals -normal attention span and ability to concentrate, able to name objects and repeat phrases. Appropriate fund of knowledge , normal sensation and normal coordination. Mental Status Affect - not  angry, not paranoid. Cranial Nerves-Normal Bilaterally. Gait-Normal.  Neuropsychiatric Mental status exam performed with findings of-able to articulate well with normal speech/language, rate, volume and coherence, thought content normal with ability to perform basic computations and apply abstract reasoning and no evidence of hallucinations, delusions, obsessions or homicidal/suicidal ideation.  Musculoskeletal Global Assessment Spine, Ribs and Pelvis - no instability, subluxation or laxity. Right Upper Extremity - no instability, subluxation or laxity.  Lymphatic Head &  Neck  General Head & Neck Lymphatics: Bilateral - Description - No Localized lymphadenopathy. Axillary  General Axillary Region: Bilateral - Description - No Localized lymphadenopathy. Femoral & Inguinal  Generalized Femoral & Inguinal Lymphatics: Left - Description - No Localized lymphadenopathy. Right - Description - No Localized lymphadenopathy.    Assessment & Plan    INCARCERATED VENTRAL HERNIA (552.20  K46.0)  Impression: Incarcerated supraumbilical ventral wall hernia. I think she would benefit from surgical repair. She agrees. Given her obesity and size, would plan to underlay mesh repair. Perhaps primary repair over the mesh  The anatomy & physiology of the abdominal wall was discussed.  The pathophysiology of hernias was discussed.  Natural history risks without surgery including progeressive enlargement, pain, incarceration, & strangulation was discussed.   Contributors to complications such as smoking, obesity, diabetes, prior surgery, etc were discussed.   I feel the risks of no intervention will lead to serious problems that outweigh the operative risks; therefore, I recommended surgery to reduce and repair the hernia.  I explained laparoscopic techniques with possible need for an open approach.  I noted the probable use of mesh to patch and/or buttress the hernia repair  Risks such as  bleeding, infection, abscess, need for further treatment, stroke, heart attack, death, and other risks were discussed.  I noted a good likelihood this will help address the problem.   Goals of post-operative recovery were discussed as well.  Possibility that this will not correct all symptoms was explained.  I stressed the importance of low-impact activity, aggressive pain control, avoiding constipation, & not pushing through pain to minimize risk of post-operative chronic pain or injury. Possibility of reherniation especially with smoking, obesity, diabetes, immunosuppression, and other health conditions was discussed.  We will work to minimize complications.     An educational handout further explaining the pathology & treatment options was given as well.  Questions were answered.  The patient expresses understanding & wishes to proceed with surgery.     Current Plans  Schedule for Surgery Written instructions provided Discussed regular exercise with patient. Pt Education - [facility] Hernia Post-Op HCI (Kelisha Dall): discussed with patient and provided information. Pt Education - [facility] Pain Control (Rufino Staup)  [name], M.D., F.A.C.S. Gastrointestinal and Minimally Invasive Surgery [facility] [address], [address] [place], [place] [address] [phone] Main / Paging

## 2015-03-26 NOTE — Op Note (Signed)
03/26/2015  12:04 PM  PATIENT:  [name]  34 y.o. female  Patient Care Team: Doris Cheadleeepak Advani, MD as PCP - General (Internal Medicine)  PRE-OPERATIVE DIAGNOSIS:  Incarcerated Ventral Wall Hernia  POST-OPERATIVE DIAGNOSIS:    Incarcerated ventral wall hernia x2 Umbilical hernia  PROCEDURE:  Procedure(s): LAPAROSCOPIC REPAIR OF INCARCERATED SUPERUMBILICAL HERNIA X2 AND UMBILICAL HERNIA REPAIR WITH MESH  SURGEON:  Surgeon(s): Karie SodaSteven Daren Doswell, MD  ASSISTANT: RN   ANESTHESIA:   local and general  EBL:  Total I/O In: 1300 [I.V.:1300] Out: -   Delay start of Pharmacological VTE agent (>24hrs) due to surgical blood loss or risk of bleeding:  no  DRAINS: none   SPECIMEN:  No Specimen  DISPOSITION OF SPECIMEN:  N/A  COUNTS:  YES  PLAN OF CARE: Discharge to home after PACU  PATIENT DISPOSITION:  PACU - hemodynamically stable.  INDICATION: Pleasant patient has developed a ventral wall abdominal hernia.   Recommendation was made for surgical repair:   The anatomy & physiology of the abdominal wall was discussed. The pathophysiology of hernias was discussed. Natural history risks without surgery including progeressive enlargement, pain, incarceration & strangulation was discussed. Contributors to complications such as smoking, obesity, diabetes, prior surgery, etc were discussed.  I feel the risks of no intervention will lead to serious problems that outweigh the operative risks; therefore, I recommended surgery to reduce and repair the hernia. I explained laparoscopic techniques with possible need for an open approach. I noted the probable use of mesh to patch and/or buttress the hernia repair  Risks such as bleeding, infection, abscess, need for further treatment, heart attack, death, and other risks were discussed. I noted a good likelihood this will help address the problem. Goals of post-operative recovery were discussed as well. Possibility that this will not correct all  symptoms was explained. I stressed the importance of low-impact activity, aggressive pain control, avoiding constipation, & not pushing through pain to minimize risk of post-operative chronic pain or injury. Possibility of reherniation especially with smoking, obesity, diabetes, immunosuppression, and other health conditions was discussed. We will work to minimize complications.  An educational handout further explaining the pathology & treatment options was given as well. Questions were answered. The patient expresses understanding & wishes to proceed with surgery.   OR FINDINGS: 4x3CM REGION OF PERIUMBILICAL HERNIAS.  1cm umbilical hernia.  Supraumbilical VWH x2 incarcerated with falciform ligament    Type of repair - Laparoscopic underlay repair   Name of mesh -  Bard Ventralight dual sided (polypropylene / Seprafilm)  Size of mesh - Length 15 cm, Width 20 cm  Mesh overlap - 5-7 cm  Placement of mesh - Intraperitoneal underlay repair   DESCRIPTION:   Informed consent was confirmed. The patient underwent general anaesthesia without difficulty. The patient was positioned appropriately. VTE prevention in place. The patient's abdomen was clipped, prepped, & draped in a sterile fashion. Surgical timeout confirmed our plan.  The patient was positioned in reverse Trendelenburg. Abdominal entry was gained using optical entry technique in the left upper abdomen. Entry was clean. I induced carbon dioxide insufflation. Camera inspection revealed no injury. Extra ports were carefully placed under direct laparoscopic visualization.   I could see the hernia in the central abdomen.  I laparoscopically freed the falciform ligament from the liver edge down to the suprapubic region..  And gradually reduced a large volume of falciform ligament and preperitoneal fat out of a supraumbilical ventral hernia.  On reinspection there were actually 2 holes.  I excised the falciform ligament and later removed at the end  the case.  I made sure hemostasis was good.  I mapped out the region using a needle passer.   To ensure that I would have at least 5 cm radial coverage outside of the hernia defect, I chose a 20x15 cm dual sided mesh.  I placed #1 Prolene stitches around its edge about every 5 cm = 14 total.  I made a superb umbilical incision to expose the hernia sacs.  I rolled the mesh & placed into the peritoneal cavity through the largest hernia fascial defect.  I unrolled  the mesh and positioned it appropriately.  I decided to position a transversely given her obesity to avoid migration at the corners.  I secured the mesh to cover up the hernia defect using a laparoscopic suture passer to pass the tails of the Prolene through the abdominal wall & tagged them with clamps.  I started out in four corners to make sure I had the mesh centered over the hernia defect appropriately, and then proceeded to work in quadrants.  We evacuated CO2 & desufflated the abdomen.  I tied the fascial stitches down.    I closed the largest supraumbilical hernia using interrupted #1 PDS sutures.  I placed 2 sutures through the center the mesh to help tack it centrally as well.  I reinsufflated the abdomen.  The mesh provided at least 5-10 cm circumferential coverage around the entire region of hernia defects.   I tacked the edges & central part of the mesh to the peritoneum/posterior rectus fascia with  SecureStrap absorbable tacks.   Hemostasis was excellent.  Mesh laid well. Capnoperitoneum was evacuated. Ports were removed. The skin was closed with Monocryl at the port sites and Steri-Strips on the fascial stitch puncture sites. OnQ catheters were placed and the sheathes peeled away. On-Q pump was secured. Patient is being extubated to go to the recovery room. I'm did discuss operative findings with the patient's mother.  Instructions noted.  Intraoperative findings noted.  Questions answered.  Family expressed understanding and appreciation.      [name], M.D., F.A.C.S. Gastrointestinal and Minimally Invasive Surgery [facility] [address], [address] [place], [place] [address] [phone] Main / Paging

## 2015-03-26 NOTE — Anesthesia Procedure Notes (Signed)
Procedure Name: Intubation Date/Time: 03/26/2015 10:04 AM Performed by: Early OsmondEARGLE, Muhsin Doris E Pre-anesthesia Checklist: Patient identified, Emergency Drugs available, Suction available and Patient being monitored Patient Re-evaluated:Patient Re-evaluated prior to inductionOxygen Delivery Method: Circle System Utilized Preoxygenation: Pre-oxygenation with 100% oxygen Intubation Type: IV induction Ventilation: Mask ventilation without difficulty Laryngoscope Size: Miller and 2 Grade View: Grade I Tube type: Oral Tube size: 7.0 mm Number of attempts: 1 Airway Equipment and Method: Stylet and Oral airway Placement Confirmation: ETT inserted through vocal cords under direct vision,  positive ETCO2 and breath sounds checked- equal and bilateral Secured at: 21 cm Tube secured with: Tape Dental Injury: Teeth and Oropharynx as per pre-operative assessment

## 2015-03-26 NOTE — Anesthesia Postprocedure Evaluation (Signed)
  Anesthesia Post-op Note  Patient: [name]  Procedure(s) Performed: Procedure(s) (LRB): LAPAROSCOPIC REPAIR OF INCARCERATED SUPERUMBILICAL HERNIA X2 AND UMBILICAL HERNIA REPAIR WITH MESH (N/A)  Patient Location: PACU  Anesthesia Type: General  Level of Consciousness: awake and alert   Airway and Oxygen Therapy: Patient Spontanous Breathing  Post-op Pain: mild  Post-op Assessment: Post-op Vital signs reviewed, Patient's Cardiovascular Status Stable, Respiratory Function Stable, Patent Airway and No signs of Nausea or vomiting  Last Vitals:  Filed Vitals:   03/26/15 1258  BP: 119/74  Pulse: 76  Temp: 36.5 C  Resp: 16    Post-op Vital Signs: stable   Complications: No apparent anesthesia complications

## 2015-03-26 NOTE — Discharge Instructions (Signed)
HERNIA REPAIR: POST OP INSTRUCTIONS ° °1. DIET: Follow a light bland diet the first 24 hours after arrival home, such as soup, liquids, crackers, etc.  Be sure to include lots of fluids daily.  Avoid fast food or heavy meals as your are more likely to get nauseated.  Eat a low fat the next few days after surgery. °2. Take your usually prescribed home medications unless otherwise directed. °3. PAIN CONTROL: °a. Pain is best controlled by a usual combination of three different methods TOGETHER: °i. Ice/Heat °ii. Over the counter pain medication °iii. Prescription pain medication °b. Most patients will experience some swelling and bruising around the hernia(s) such as the bellybutton, groins, or old incisions.  Ice packs or heating pads (30-60 minutes up to 6 times a day) will help. Use ice for the first few days to help decrease swelling and bruising, then switch to heat to help relax tight/sore spots and speed recovery.  Some people prefer to use ice alone, heat alone, alternating between ice & heat.  Experiment to what works for you.  Swelling and bruising can take several weeks to resolve.   °c. It is helpful to take an over-the-counter pain medication regularly for the first few weeks.  Choose one of the following that works best for you: °i. Naproxen (Aleve, etc)  Two 220mg tabs twice a day °ii. Ibuprofen (Advil, etc) Three 200mg tabs four times a day (every meal & bedtime) °iii. Acetaminophen (Tylenol, etc) 325-650mg four times a day (every meal & bedtime) °d. A  prescription for pain medication should be given to you upon discharge.  Take your pain medication as prescribed.  °i. If you are having problems/concerns with the prescription medicine (does not control pain, nausea, vomiting, rash, itching, etc), please call us [phone] to see if we need to switch you to a different pain medicine that will work better for you and/or control your side effect better. °ii. If you need a refill on your pain  medication, please contact your pharmacy.  They will contact our office to request authorization. Prescriptions will not be filled after 5 pm or on week-ends. °4. Avoid getting constipated.  Between the surgery and the pain medications, it is common to experience some constipation.  Increasing fluid intake and taking a fiber supplement (such as Metamucil, Citrucel, FiberCon, MiraLax, etc) 1-2 times a day regularly will usually help prevent this problem from occurring.  A mild laxative (prune juice, Milk of Magnesia, MiraLax, etc) should be taken according to package directions if there are no bowel movements after 48 hours.   °5. Wash / shower every day.  You may shower over the dressings as they are waterproof.   °6. Remove your waterproof bandages 5 days after surgery.  You may leave the incision open to air.  You may replace a dressing/Band-Aid to cover the incision for comfort if you wish.  Continue to shower over incision(s) after the dressing is off. ° ° ° °7. ACTIVITIES as tolerated:   °a. You may resume regular (light) daily activities beginning the next day--such as daily self-care, walking, climbing stairs--gradually increasing activities as tolerated.  If you can walk 30 minutes without difficulty, it is safe to try more intense activity such as jogging, treadmill, bicycling, low-impact aerobics, swimming, etc. °b. Save the most intensive and strenuous activity for last such as sit-ups, heavy lifting, contact sports, etc  Refrain from any heavy lifting or straining until you are off narcotics for pain control.   °  c. DO NOT PUSH THROUGH PAIN.  Let pain be your guide: If it hurts to do something, don't do it.  Pain is your body warning you to avoid that activity for another week until the pain goes down. °d. You may drive when you are no longer taking prescription pain medication, you can comfortably wear a seatbelt, and you can safely maneuver your car and apply brakes. °e. You may have sexual intercourse  when it is comfortable.  °8. FOLLOW UP in our office °a. Please call [facility] at [phone] to set up an appointment to see your surgeon in the office for a follow-up appointment approximately 2-3 weeks after your surgery. °b. Make sure that you call for this appointment the day you arrive home to insure a convenient appointment time. °9.  IF YOU HAVE DISABILITY OR FAMILY LEAVE FORMS, BRING THEM TO THE OFFICE FOR PROCESSING.  DO NOT GIVE THEM TO YOUR DOCTOR. ° °WHEN TO CALL US [phone]: °1. Poor pain control °2. Reactions / problems with new medications (rash/itching, nausea, etc)  °3. Fever over 101.5 F (38.5 C) °4. Inability to urinate °5. Nausea and/or vomiting °6. Worsening swelling or bruising °7. Continued bleeding from incision. °8. Increased pain, redness, or drainage from the incision ° ° The clinic staff is available to answer your questions during regular business hours (8:30am-5pm).  Please don’t hesitate to call and ask to speak to one of our nurses for clinical concerns.  ° If you have a medical emergency, go to the nearest emergency room or call 911. ° A surgeon from [facility] is always on call at the hospitals in [place] ° °[facility] °[address], [address], [place], [place]  [address] ? ° [address] [address], [place], [place]   [address] °MAIN: [phone] ? TOLL FREE: [phone] ? FAX: [phone] °[email or web address] ° °Managing Pain ° °Pain after surgery or related to activity is often due to strain/injury to muscle, tendon, nerves and/or incisions.  This pain is usually short-term and will improve in a few months.  ° °Many people find it helpful to do the following things TOGETHER to help speed the process of healing and to get back to regular activity more quickly: ° °1. Avoid heavy physical activity at first °a. No lifting greater than 20 pounds at first, then increase to lifting as tolerated over the next few weeks °b. Do not “push  through” the pain.  Listen to your body and avoid positions and maneuvers than reproduce the pain.  Wait a few days before trying something more intense °c. Walking is okay as tolerated, but go slowly and stop when getting sore.  If you can walk 30 minutes without stopping or pain, you can try more intense activity (running, jogging, aerobics, cycling, swimming, treadmill, sex, sports, weightlifting, etc ) °d. Remember: If it hurts to do it, then don’t do it! ° °2. Take Anti-inflammatory medication °a. Choose ONE of the following over-the-counter medications: °i.            Acetaminophen 500mg tabs (Tylenol) 1-2 pills with every meal and just before bedtime (avoid if you have liver problems) °ii.            Naproxen 220mg tabs (ex. Aleve) 1-2 pills twice a day (avoid if you have kidney, stomach, IBD, or bleeding problems) °iii. Ibuprofen 200mg tabs (ex. Advil, Motrin) 3-4 pills with every meal and just before bedtime (avoid if you have kidney, stomach, IBD, or bleeding   problems) b. Take with food/snack around the clock for 1-2 weeks i. This helps the muscle and nerve tissues become less irritable and calm down faster  3. Use a Heating pad or Ice/Cold Pack a. 4-6 times a day b. May use warm bath/hottub  or showers  4. Try Gentle Massage and/or Stretching  a. at the area of pain many times a day b. stop if you feel pain - do not overdo it  Try these steps together to help you body heal faster and avoid making things get worse.  Doing just one of these things may not be enough.    If you are not getting better after two weeks or are noticing you are getting worse, contact our office for further advice; we may need to re-evaluate you & see what other things we can do to help.  GETTING TO GOOD BOWEL HEALTH. Irregular bowel habits such as constipation and diarrhea can lead to many problems over time.  Having one soft bowel movement a day is the most important way to prevent further problems.  The  anorectal canal is designed to handle stretching and feces to safely manage our ability to get rid of solid waste (feces, poop, stool) out of our body.  BUT, hard constipated stools can act like ripping concrete bricks and diarrhea can be a burning fire to this very sensitive area of our body, causing inflamed hemorrhoids, anal fissures, increasing risk is perirectal abscesses, abdominal pain/bloating, an making irritable bowel worse.     The goal: ONE SOFT BOWEL MOVEMENT A DAY!  To have soft, regular bowel movements:   Drink at least 8 tall glasses of water a day.    Take plenty of fiber.  Fiber is the undigested part of plant food that passes into the colon, acting s natures broom to encourage bowel motility and movement.  Fiber can absorb and hold large amounts of water. This results in a larger, bulkier stool, which is soft and easier to pass. Work gradually over several weeks up to 6 servings a day of fiber (25g a day even more if needed) in the form of: o Vegetables -- Root (potatoes, carrots, turnips), leafy green (lettuce, salad greens, celery, spinach), or cooked high residue (cabbage, broccoli, etc) o Fruit -- Fresh (unpeeled skin & pulp), Dried (prunes, apricots, cherries, etc ),  or stewed ( applesauce)  o Whole grain breads, pasta, etc (whole wheat)  o Bran cereals   Bulking Agents -- This type of water-retaining fiber generally is easily obtained each day by one of the following:  o Psyllium bran -- The psyllium plant is remarkable because its ground seeds can retain so much water. This product is available as Metamucil, Konsyl, Effersyllium, Per Diem Fiber, or the less expensive generic preparation in drug and health food stores. Although labeled a laxative, it really is not a laxative.  o Methylcellulose -- This is another fiber derived from wood which also retains water. It is available as Citrucel. o Polyethylene Glycol - and artificial fiber commonly called Miralax or Glycolax.   It is helpful for people with gassy or bloated feelings with regular fiber o Flax Seed - a less gassy fiber than psyllium  No reading or other relaxing activity while on the toilet. If bowel movements take longer than 5 minutes, you are too constipated  AVOID CONSTIPATION.  High fiber and water intake usually takes care of this.  Sometimes a laxative is needed to stimulate more frequent bowel movements, but  Laxatives are not a good long-term solution as it can wear the colon out. o Osmotics (Milk of Magnesia, Fleets phosphosoda, Magnesium citrate, MiraLax, GoLytely) are safer than  o Stimulants (Senokot, Castor Oil, Dulcolax, Ex Lax)    o Do not take laxatives for more than 7days in a row.   IF SEVERELY CONSTIPATED, try a Bowel Retraining Program: o Do not use laxatives.  o Eat a diet high in roughage, such as bran cereals and leafy vegetables.  o Drink six (6) ounces of prune or apricot juice each morning.  o Eat two (2) large servings of stewed fruit each day.  o Take one (1) heaping tablespoon of a psyllium-based bulking agent twice a day. Use sugar-free sweetener when possible to avoid excessive calories.  o Eat a normal breakfast.  o Set aside 15 minutes after breakfast to sit on the toilet, but do not strain to have a bowel movement.  o If you do not have a bowel movement by the third day, use an enema and repeat the above steps.   Controlling diarrhea o Switch to liquids and simpler foods for a few days to avoid stressing your intestines further. o Avoid dairy products (especially milk & ice cream) for a short time.  The intestines often can lose the ability to digest lactose when stressed. o Avoid foods that cause gassiness or bloating.  Typical foods include beans and other legumes, cabbage, broccoli, and dairy foods.  Every person has some sensitivity to other foods, so listen to our body and avoid those foods that trigger problems for you. o Adding fiber (Citrucel, Metamucil,  psyllium, Miralax) gradually can help thicken stools by absorbing excess fluid and retrain the intestines to act more normally.  Slowly increase the dose over a few weeks.  Too much fiber too soon can backfire and cause cramping & bloating. o Probiotics (such as active yogurt, Align, etc) may help repopulate the intestines and colon with normal bacteria and calm down a sensitive digestive tract.  Most studies show it to be of mild help, though, and such products can be costly. o Medicines: - Bismuth subsalicylate (ex. Kayopectate, Pepto Bismol) every 30 minutes for up to 6 doses can help control diarrhea.  Avoid if pregnant. - Loperamide (Immodium) can slow down diarrhea.  Start with two tablets (4mg  total) first and then try one tablet every 6 hours.  Avoid if you are having fevers or severe pain.  If you are not better or start feeling worse, stop all medicines and call your doctor for advice o Call your doctor if you are getting worse or not better.  Sometimes further testing (cultures, endoscopy, X-ray studies, bloodwork, etc) may be needed to help diagnose and treat the cause of the diarrhea.  Hernia A hernia occurs when an internal organ pushes out through a weak spot in the abdominal wall. Hernias most commonly occur in the groin and around the navel. Hernias often can be pushed back into place (reduced). Most hernias tend to get worse over time. Some abdominal hernias can get stuck in the opening (irreducible or incarcerated hernia) and cannot be reduced. An irreducible abdominal hernia which is tightly squeezed into the opening is at risk for impaired blood supply (strangulated hernia). A strangulated hernia is a medical emergency. Because of the risk for an irreducible or strangulated hernia, surgery may be recommended to repair a hernia. CAUSES   Heavy lifting.  Prolonged coughing.  Straining to have a bowel movement.  A cut (  incision) made during an abdominal surgery. HOME CARE  INSTRUCTIONS   Bed rest is not required. You may continue your normal activities.  Avoid lifting more than 10 pounds (4.5 kg) or straining.  Cough gently. If you are a smoker it is best to stop. Even the best hernia repair can break down with the continual strain of coughing. Even if you do not have your hernia repaired, a cough will continue to aggravate the problem.  Do not wear anything tight over your hernia. Do not try to keep it in with an outside bandage or truss. These can damage abdominal contents if they are trapped within the hernia sac.  Eat a normal diet.  Avoid constipation. Straining over long periods of time will increase hernia size and encourage breakdown of repairs. If you cannot do this with diet alone, stool softeners may be used. SEEK IMMEDIATE MEDICAL CARE IF:   You have a fever.  You develop increasing abdominal pain.  You feel nauseous or vomit.  Your hernia is stuck outside the abdomen, looks discolored, feels hard, or is tender.  You have any changes in your bowel habits or in the hernia that are unusual for you.  You have increased pain or swelling around the hernia.  You cannot push the hernia back in place by applying gentle pressure while lying down. MAKE SURE YOU:   Understand these instructions.  Will watch your condition.  Will get help right away if you are not doing well or get worse. Document Released: 11/21/2005 Document Revised: 02/13/2012 Document Reviewed: 07/10/2008 [facility] Patient Information 2015 [place], [place]. This information is not intended to replace advice given to you by your health care provider. Make sure you discuss any questions you have with your health care provider.

## 2015-03-26 NOTE — Progress Notes (Signed)
Pt up ambulating in short stay.  Pt ambulated 1 lap around entire short stay area and tolerated well.

## 2015-03-26 NOTE — Transfer of Care (Signed)
Immediate Anesthesia Transfer of Care Note  Patient: [name]  Procedure(s) Performed: Procedure(s): LAPAROSCOPIC REPAIR OF INCARCERATED SUPERUMBILICAL HERNIA X2 AND UMBILICAL HERNIA REPAIR WITH MESH (N/A)  Patient Location: PACU  Anesthesia Type:General  Level of Consciousness:  sedated, patient cooperative and responds to stimulation  Airway & Oxygen Therapy:Patient Spontanous Breathing and Patient connected to face mask oxgen  Post-op Assessment:  Report given to PACU RN and Post -op Vital signs reviewed and stable  Post vital signs:  Reviewed and stable  Last Vitals:  Filed Vitals:   03/26/15 1159  BP: 151/91  Pulse: 81  Temp:   Resp: 21    Complications: No apparent anesthesia complications

## 2015-03-27 ENCOUNTER — Encounter (HOSPITAL_COMMUNITY): Payer: Self-pay | Admitting: Surgery

## 2015-03-28 ENCOUNTER — Inpatient Hospital Stay (HOSPITAL_COMMUNITY)
Admission: EM | Admit: 2015-03-28 | Discharge: 2015-04-01 | DRG: 354 | Disposition: A | Discharge status: Home / Self Care | Payer: 59 | Attending: Surgery | Admitting: Surgery

## 2015-03-28 ENCOUNTER — Encounter (HOSPITAL_COMMUNITY): Payer: Self-pay | Admitting: Emergency Medicine

## 2015-03-28 ENCOUNTER — Emergency Department (HOSPITAL_COMMUNITY): Payer: 59

## 2015-03-28 DIAGNOSIS — K219 Gastro-esophageal reflux disease without esophagitis: Secondary | ICD-10-CM | POA: Diagnosis present

## 2015-03-28 DIAGNOSIS — Z888 Allergy status to other drugs, medicaments and biological substances status: Secondary | ICD-10-CM | POA: Diagnosis not present

## 2015-03-28 DIAGNOSIS — Z6824 Body mass index (BMI) 24.0-24.9, adult: Secondary | ICD-10-CM | POA: Diagnosis not present

## 2015-03-28 DIAGNOSIS — Z87730 Personal history of (corrected) cleft lip and palate: Secondary | ICD-10-CM | POA: Diagnosis not present

## 2015-03-28 DIAGNOSIS — R109 Unspecified abdominal pain: Secondary | ICD-10-CM

## 2015-03-28 DIAGNOSIS — I4891 Unspecified atrial fibrillation: Secondary | ICD-10-CM | POA: Diagnosis present

## 2015-03-28 DIAGNOSIS — K436 Other and unspecified ventral hernia with obstruction, without gangrene: Secondary | ICD-10-CM | POA: Diagnosis present

## 2015-03-28 DIAGNOSIS — F419 Anxiety disorder, unspecified: Secondary | ICD-10-CM | POA: Diagnosis present

## 2015-03-28 DIAGNOSIS — K913 Postprocedural intestinal obstruction: Secondary | ICD-10-CM | POA: Diagnosis present

## 2015-03-28 DIAGNOSIS — K9189 Other postprocedural complications and disorders of digestive system: Secondary | ICD-10-CM

## 2015-03-28 DIAGNOSIS — E669 Obesity, unspecified: Secondary | ICD-10-CM | POA: Diagnosis present

## 2015-03-28 DIAGNOSIS — R112 Nausea with vomiting, unspecified: Secondary | ICD-10-CM | POA: Diagnosis present

## 2015-03-28 DIAGNOSIS — M549 Dorsalgia, unspecified: Secondary | ICD-10-CM | POA: Diagnosis present

## 2015-03-28 DIAGNOSIS — F329 Major depressive disorder, single episode, unspecified: Secondary | ICD-10-CM | POA: Diagnosis present

## 2015-03-28 DIAGNOSIS — G8929 Other chronic pain: Secondary | ICD-10-CM | POA: Diagnosis present

## 2015-03-28 DIAGNOSIS — K429 Umbilical hernia without obstruction or gangrene: Secondary | ICD-10-CM | POA: Diagnosis present

## 2015-03-28 DIAGNOSIS — K567 Ileus, unspecified: Secondary | ICD-10-CM | POA: Diagnosis present

## 2015-03-28 DIAGNOSIS — I10 Essential (primary) hypertension: Secondary | ICD-10-CM | POA: Diagnosis present

## 2015-03-28 DIAGNOSIS — Z79899 Other long term (current) drug therapy: Secondary | ICD-10-CM | POA: Diagnosis not present

## 2015-03-28 DIAGNOSIS — Z87891 Personal history of nicotine dependence: Secondary | ICD-10-CM | POA: Diagnosis not present

## 2015-03-28 LAB — URINALYSIS, ROUTINE W REFLEX MICROSCOPIC
Bilirubin Urine: NEGATIVE
Glucose, UA: NEGATIVE mg/dL
Hgb urine dipstick: NEGATIVE
Ketones, ur: NEGATIVE mg/dL
Leukocytes, UA: NEGATIVE
NITRITE: NEGATIVE
Protein, ur: NEGATIVE mg/dL
Specific Gravity, Urine: <1.005 — ABNORMAL LOW (ref 1.005–1.030)
UROBILINOGEN UA: 0.2 mg/dL (ref 0.0–1.0)
pH: 8.5 — ABNORMAL HIGH (ref 5.0–8.0)

## 2015-03-28 LAB — COMPREHENSIVE METABOLIC PANEL
ALT: 28 U/L (ref 0–35)
AST: 32 U/L (ref 0–37)
Albumin: 4.6 g/dL (ref 3.5–5.2)
Alkaline Phosphatase: 46 U/L (ref 39–117)
Anion gap: 13 (ref 5–15)
BUN: 7 mg/dL (ref 6–23)
CO2: 30 mmol/L (ref 19–32)
CREATININE: 0.54 mg/dL (ref 0.50–1.10)
Calcium: 9.7 mg/dL (ref 8.4–10.5)
Chloride: 98 mmol/L (ref 96–112)
GFR calc Af Amer: >90 mL/min (ref >90)
GLUCOSE: 137 mg/dL — AB (ref 70–99)
Potassium: 3.3 mmol/L — ABNORMAL LOW (ref 3.5–5.1)
Sodium: 141 mmol/L (ref 135–145)
Total Bilirubin: 0.4 mg/dL (ref 0.3–1.2)
Total Protein: 7.9 g/dL (ref 6.0–8.3)

## 2015-03-28 LAB — CBC WITH DIFFERENTIAL/PLATELET
Basophils Absolute: 0 10*3/uL (ref 0.0–0.1)
Basophils Relative: 0 % (ref 0–1)
Eosinophils Absolute: 0 10*3/uL (ref 0.0–0.7)
Eosinophils Relative: 0 % (ref 0–5)
HCT: 41.1 % (ref 36.0–46.0)
Hemoglobin: 13.9 g/dL (ref 12.0–15.0)
LYMPHS PCT: 17 % (ref 12–46)
Lymphs Abs: 3.3 10*3/uL (ref 0.7–4.0)
MCH: 31.6 pg (ref 26.0–34.0)
MCHC: 33.8 g/dL (ref 30.0–36.0)
MCV: 93.4 fL (ref 78.0–100.0)
Monocytes Absolute: 1.2 10*3/uL — ABNORMAL HIGH (ref 0.1–1.0)
Monocytes Relative: 6 % (ref 3–12)
NEUTROS PCT: 77 % (ref 43–77)
Neutro Abs: 14.4 10*3/uL — ABNORMAL HIGH (ref 1.7–7.7)
PLATELETS: 305 10*3/uL (ref 150–400)
RBC: 4.4 MIL/uL (ref 3.87–5.11)
RDW: 14 % (ref 11.5–15.5)
WBC: 18.8 10*3/uL — AB (ref 4.0–10.5)

## 2015-03-28 LAB — URINE MICROSCOPIC-ADD ON

## 2015-03-28 MED ORDER — ONDANSETRON HCL 4 MG/2ML IJ SOLN
4.0000 mg | Freq: Once | INTRAMUSCULAR | Status: AC
  Administered 2015-03-28: 4 mg via INTRAVENOUS
  Filled 2015-03-28: qty 2

## 2015-03-28 MED ORDER — ONDANSETRON HCL 4 MG/2ML IJ SOLN
4.0000 mg | Freq: Four times a day (QID) | INTRAMUSCULAR | Status: DC | PRN
  Administered 2015-03-28 (×2): 4 mg via INTRAVENOUS
  Filled 2015-03-28 (×2): qty 2

## 2015-03-28 MED ORDER — PANTOPRAZOLE SODIUM 40 MG IV SOLR
40.0000 mg | Freq: Every day | INTRAVENOUS | Status: DC
  Administered 2015-03-28 – 2015-03-29 (×2): 40 mg via INTRAVENOUS
  Filled 2015-03-28 (×3): qty 40

## 2015-03-28 MED ORDER — FENTANYL CITRATE (PF) 100 MCG/2ML IJ SOLN
50.0000 ug | Freq: Once | INTRAMUSCULAR | Status: AC
  Administered 2015-03-28: 50 ug via INTRAVENOUS
  Filled 2015-03-28: qty 2

## 2015-03-28 MED ORDER — MORPHINE SULFATE 2 MG/ML IJ SOLN
1.0000 mg | INTRAMUSCULAR | Status: DC | PRN
  Administered 2015-03-28 – 2015-03-30 (×12): 2 mg via INTRAVENOUS
  Administered 2015-03-30: 3 mg via INTRAVENOUS
  Administered 2015-03-30: 2 mg via INTRAVENOUS
  Filled 2015-03-28 (×8): qty 1
  Filled 2015-03-28: qty 2
  Filled 2015-03-28 (×5): qty 1

## 2015-03-28 MED ORDER — HYDROCODONE-ACETAMINOPHEN 5-325 MG PO TABS: 1.0000 | ORAL_TABLET | ORAL | Status: DC | PRN

## 2015-03-28 MED ORDER — ENALAPRILAT 1.25 MG/ML IV SOLN
1.2500 mg | Freq: Once | INTRAVENOUS | Status: AC
  Administered 2015-03-28: 1.25 mg via INTRAVENOUS
  Filled 2015-03-28: qty 1

## 2015-03-28 MED ORDER — SODIUM CHLORIDE 0.9 % IV SOLN
INTRAVENOUS | Status: DC
  Administered 2015-03-28: 07:00:00 via INTRAVENOUS

## 2015-03-28 MED ORDER — METOCLOPRAMIDE HCL 5 MG/ML IJ SOLN
10.0000 mg | Freq: Once | INTRAMUSCULAR | Status: AC
  Administered 2015-03-28: 10 mg via INTRAVENOUS
  Filled 2015-03-28: qty 2

## 2015-03-28 MED ORDER — HEPARIN SODIUM (PORCINE) 5000 UNIT/ML IJ SOLN
5000.0000 [IU] | Freq: Three times a day (TID) | INTRAMUSCULAR | Status: DC
  Administered 2015-03-28 – 2015-04-01 (×8): 5000 [IU] via SUBCUTANEOUS
  Filled 2015-03-28 (×15): qty 1

## 2015-03-28 MED ORDER — KCL IN DEXTROSE-NACL 20-5-0.45 MEQ/L-%-% IV SOLN
INTRAVENOUS | Status: DC
  Administered 2015-03-28 – 2015-03-30 (×5): via INTRAVENOUS
  Filled 2015-03-28 (×10): qty 1000

## 2015-03-28 NOTE — ED Notes (Signed)
Brought in from home by [facility] with c/o abdominal pain.  Pt reports having abdominal pain with nausea and vomiting, onset at 0330.  Pt has had recent abdominal surgery (last Thursday) for hernia repair.

## 2015-03-28 NOTE — ED Notes (Signed)
Awake. Verbally responsive. A/O x4. Resp even and unlabored. No audible adventitious breath sounds noted. ABC's intact. Pt ambulated to BR with steady gait. Pt has on abd binder.

## 2015-03-28 NOTE — ED Provider Notes (Signed)
CSN: [id number]     Arrival date & time 03/28/15  0405 History   First MD Initiated Contact with Patient 03/28/15 [phone]     Chief Complaint  Patient presents with  . Abdominal Pain     (Consider location/radiation/quality/duration/timing/severity/associated sxs/prior Treatment) HPI 34 year old female presents to the emergency department from home with complaint of onset of nausea and vomiting this morning at 3:30.  Patient is postop day 2 from laparoscopic ventral hernia repair.  Patient reports despite taking milk of magnesia.  She has not had a bowel movement since having the surgery.  She is also not passed gas.  She feels that her abdomen is distended.  Patient complaining of diffuse abdominal pain. Past Medical History  Diagnosis Date  . Ectopic pregnancy   . Urinary tract infection   . Ovarian cyst   . Infection     UTI  . Back pain, chronic   . Dysrhythmia     hx of a-fib  . Hypertension     was told, but never on meds  . Heart murmur   . Depression     hx of   . GERD (gastroesophageal reflux disease)   . Headache     occasional    Past Surgical History  Procedure Laterality Date  . Etopical surgery    . Cleft palate repair    . Laparoscopy for ectopic pregnancy  2011  . Ventral hernia repair N/A 03/26/2015    Procedure: LAPAROSCOPIC REPAIR OF INCARCERATED SUPERUMBILICAL HERNIA X2 AND UMBILICAL HERNIA REPAIR WITH MESH;  Surgeon: Karie SodaSteven Gross, MD;  Location: [facility];  Service: General;  Laterality: N/A;   Family History  Problem Relation Age of Onset  . Other Neg Hx   . Hearing loss Neg Hx   . Hypertension Mother   . Hypertension Father   . Cancer Sister   . Heart disease Maternal Grandmother   . Cancer Maternal Grandfather    History  Substance Use Topics  . Smoking status: Former Smoker -- 0.25 packs/day for 15 years    Types: Cigarettes    Quit date: 10/02/2014  . Smokeless tobacco: Never Used  . Alcohol Use: No     Comment: hx of occasiional use    OB  History    Gravida Para Term Preterm AB TAB SAB Ectopic Multiple Living   2    2 0 1 1 0 0     Review of Systems  See History of Present Illness; otherwise all other systems are reviewed and negative   Allergies  Flexeril  Home Medications   Prior to Admission medications   Medication Sig Start Date End Date Taking? Authorizing Provider  methocarbamol (ROBAXIN) 750 MG tablet Take 1 tablet (750 mg total) by mouth every 6 (six) hours as needed for muscle spasms. 03/26/15  Yes Karie SodaSteven Gross, MD  oxyCODONE (OXY IR/ROXICODONE) 5 MG immediate release tablet Take 1-2 tablets (5-10 mg total) by mouth every 4 (four) hours as needed for moderate pain, severe pain or breakthrough pain. 03/26/15  Yes Karie SodaSteven Gross, MD  divalproex (DEPAKOTE ER) 500 MG 24 hr tablet Take 2 tablets (1,000 mg total) by mouth at bedtime. For mood stabilization. Patient not taking: Reported on 12/07/2014 08/22/13   Tamala JulianNeil T Mashburn, PA-C  ibuprofen (ADVIL,MOTRIN) 600 MG tablet Take 1 tablet (600 mg total) by mouth every 6 (six) hours as needed for headache or moderate pain. 03/26/15   Karie SodaSteven Gross, MD   BP 174/108 mmHg  Pulse 91  Temp(Src)  97.7 F (36.5 C) (Oral)  Resp 18  SpO2 96%  LMP 03/16/2015 Physical Exam  Constitutional: She is oriented to person, place, and time. She appears well-developed and well-nourished.  HENT:  Head: Normocephalic and atraumatic.  Nose: Nose normal.  Mouth/Throat: Oropharynx is clear and moist.  Eyes: Conjunctivae and EOM are normal. Pupils are equal, round, and reactive to light.  Neck: Normal range of motion. Neck supple. No JVD present. No tracheal deviation present. No thyromegaly present.  Cardiovascular: Normal rate, regular rhythm, normal heart sounds and intact distal pulses.  Exam reveals no gallop and no friction rub.   No murmur heard. Pulmonary/Chest: Effort normal and breath sounds normal. No stridor. No respiratory distress. She has no wheezes. She has no rales. She exhibits no  tenderness.  Abdominal: Soft. She exhibits distension (diffuse tenderness with palpation). She exhibits no mass. There is tenderness. There is no rebound and no guarding.  Absent bowel sounds.  Laparoscopic incisions noted  Musculoskeletal: Normal range of motion. She exhibits no edema or tenderness.  Lymphadenopathy:    She has no cervical adenopathy.  Neurological: She is alert and oriented to person, place, and time. She displays normal reflexes. She exhibits normal muscle tone. Coordination normal.  Skin: Skin is warm and dry. No rash noted. No erythema. No pallor.  Psychiatric: She has a normal mood and affect. Her behavior is normal. Judgment and thought content normal.  Nursing note and vitals reviewed.   ED Course  Procedures (including critical care time) Labs Review Labs Reviewed  CBC WITH DIFFERENTIAL/PLATELET  COMPREHENSIVE METABOLIC PANEL    Imaging Review Dg Abd Acute W/chest  03/28/2015   CLINICAL DATA:  Acute onset of generalized abdominal pain, nausea and vomiting. Initial encounter.  EXAM: DG ABDOMEN ACUTE W/ 1V CHEST  COMPARISON:  Chest radiograph performed 10/23/2014, and abdominal radiograph from 05/02/2014  FINDINGS: The lungs are well-aerated. Minimal bilateral atelectasis is noted. There is no evidence of pleural effusion or pneumothorax. The cardiomediastinal silhouette is within normal limits.  The visualized bowel gas pattern is nonspecific. Scattered stool and air are seen within the colon; apparent wall thickening is noted along a small bowel loop at the left mid abdomen, which may reflect mild ileitis. No free intra-abdominal air is identified on the provided upright view.  No acute osseous abnormalities are seen; the sacroiliac joints are unremarkable in appearance.  IMPRESSION: 1. Apparent wall thickening along a small bowel loop at the left mid abdomen, which may reflect a mild infectious or inflammatory ileitis. Otherwise unremarkable bowel gas pattern. No free  intra-abdominal air seen. 2. Minimal bilateral atelectasis noted.   Electronically Signed   By: Roanna Raider M.D.   On: 03/28/2015 05:29     EKG Interpretation None      MDM   Final diagnoses:  Acute abdominal pain  Ileus, postoperative    34 year old female with nausea and vomiting tonight after surgery on Thursday.  Patient not yet having flatus or BM.  Plan for labs, pain and nausea control, IV fluids and acute abdominal series to investigate for obstruction.  6:04 AM Acute abdominal series with possible ileitis, otherwise unremarkable, no intra-abdominal air or air-fluid loops.  6:18 AM Patient reports no improvement in nausea or abdominal pain.  Will re-dose with Reglan.  Case discussed with Dr. Ezzard Standing, on call for general surgery who will see the patient in the emergency department.  Marisa Severin, MD 03/28/15 [phone]

## 2015-03-28 NOTE — ED Notes (Signed)
Bed: WA06 Expected date:  Expected time:  Means of arrival:  Comments: EMS 

## 2015-03-28 NOTE — ED Notes (Signed)
Awake. Verbally responsive. Resp even and unlabored. No audible adventitious breath sounds noted. ABC's intact. Abd soft/nondistended but tender to palpate. BS (+) and active x4 quadrants. No N/V/D reported at this time. 

## 2015-03-28 NOTE — H&P (Signed)
Re:   Breanna [name] DOB:   [birth year] MRN:   [id number]   [facility] Admission note  ASSESSMENT AND PLAN: 1.  Post op ileus  Will admit for IV hydration, control of nausea and pain, and follow up exams.  Will hold off on NGT for now, but she may need one  2.  Laparoscopic ventral hernia repair - 03/26/2015 - S. Graves  3.  History of depression 4.  GERD 5.  Chronic back pain 6.  History of anxiety  Chief Complaint  Patient presents with  . Abdominal Pain   REFERRING PHYSICIAN:  Dr. Marisa Severinlga Graves, [facility]  HISTORY OF PRESENT ILLNESS: [name] is a 34 y.o. (DOB: [birth year])  AA  female whose primary care physician is Breanna CheadleADVANI, DEEPAK, MD (? Dr. Lowella BandyV. Graves)  [facility], [facility] and [facility].  [name] had a laparoscopic ventral hernia repair by Dr. Michaell Graves on 03/26/2015.  Her only prior abdominal surgery was for a ectopic pregnancy in 2012. She actually took a bus to the hospital the day of surgery and a taxi home.  She has had abdominal pain since surgery.  She at some soup and oatmeal. She has had increasing abdominal distention, then nausea and vomiting.  [facility].  KUB - 03/28/2015 - non specific WBC - 18,800 - 03/28/2015   Past Medical History  Diagnosis Date  . Ectopic pregnancy   . Urinary tract infection   . Ovarian cyst   . Infection     UTI  . Back pain, chronic   . Dysrhythmia     hx of a-fib  . Hypertension     was told, but never on meds  . Heart murmur   . Depression     hx of   . GERD (gastroesophageal reflux disease)   . Headache     occasional       Past Surgical History  Procedure Laterality Date  . Etopical surgery    . Cleft palate repair    . Laparoscopy for ectopic pregnancy  2011  . Ventral hernia repair N/A 03/26/2015    Procedure: LAPAROSCOPIC REPAIR OF INCARCERATED SUPERUMBILICAL HERNIA X2 AND UMBILICAL HERNIA REPAIR WITH MESH;  Surgeon: Breanna SodaSteven Gross, MD;  Location:  [facility];  Service: General;  Laterality: N/A;      Current Facility-Administered Medications  Medication Dose Route Frequency Provider Last Rate Last Dose  . 0.9 %  sodium chloride infusion   Intravenous Continuous Breanna Severinlga Otter, MD 125 mL/hr at 03/28/15 [id number]     Current Outpatient Prescriptions  Medication Sig Dispense Refill  . methocarbamol (ROBAXIN) 750 MG tablet Take 1 tablet (750 mg total) by mouth every 6 (six) hours as needed for muscle spasms. 30 tablet 1  . oxyCODONE (OXY IR/ROXICODONE) 5 MG immediate release tablet Take 1-2 tablets (5-10 mg total) by mouth every 4 (four) hours as needed for moderate pain, severe pain or breakthrough pain. 40 tablet 0  . divalproex (DEPAKOTE ER) 500 MG 24 hr tablet Take 2 tablets (1,000 mg total) by mouth at bedtime. For mood stabilization. (Patient not taking: Reported on 12/07/2014) 60 tablet 0  . ibuprofen (ADVIL,MOTRIN) 600 MG tablet Take 1 tablet (600 mg total) by mouth every 6 (six) hours as needed for headache or moderate pain. 40 tablet 1      Allergies  Allergen Reactions  . Flexeril [Cyclobenzaprine] Hives    REVIEW OF SYSTEMS:  Skin:  No history of rash.  No history of abnormal moles. Infection:  No history of hepatitis or HIV.  No history of MRSA. Neurologic:  No history of stroke.  No history of seizure.  No history of headaches. Cardiac:  Was treated for HTN, but she is on no meds at this time.  Pulmonary:  Quit smoking in Nov 2015.  Quit smoking again in Feb 2016.  Endocrine:  No diabetes. No thyroid disease. Gastrointestinal:  GERD No history of liver disease.  No history of gall bladder disease.  No history of pancreas disease.  No history of colon disease. Urologic:  No history of kidney stones.  No history of bladder infections. Musculoskeletal:  She has chronic back pain.  She relates it to work. Hematologic:  No bleeding disorder.  No history of anemia.  Not anticoagulated. Psycho-social:  The patient is oriented.   The  patient has no obvious psychologic or social impairment to understanding our conversation and plan.  SOCIAL and FAMILY HISTORY: Unmarried. Lives with mother. No children. Works at [employer].  PHYSICAL EXAM: BP 127/79 mmHg  Pulse 84  Temp(Src) 97.6 F (36.4 C) (Oral)  Resp 18  SpO2 93%  LMP 03/16/2015  General: WN AA F who is alert and generally healthy appearing.  HEENT: Normal. Pupils equal. Neck: Supple. No mass.  No thyroid mass. Lymph Nodes:  No supraclavicular or cervical nodes. Lungs: Clear to auscultation and symmetric breath sounds. Heart:  RRR. No murmur or rub. Abdomen: Distended.  No BS.  No focal tenderness.  Steristrips and umbilical dressing still in place.  In abdominal binder. Rectal: Not done. Extremities:  Good strength and ROM  in upper and lower extremities. Neurologic:  Grossly intact to motor and sensory function. Psychiatric: Has normal mood and affect. Behavior is normal.   DATA REVIEWED: Epic notes.  Ovidio Kin, MD,  [facility] [address] [place]  [address]   [place], [place]    [address] Phone:  [phone] FAX:  [phone]

## 2015-03-28 NOTE — ED Notes (Signed)
Awake. Verbally responsive. A/O x4. Resp even and unlabored. No audible adventitious breath sounds noted. ABC's intact.  

## 2015-03-28 NOTE — Progress Notes (Signed)
Dr Andrey CampanileWilson called concerning patients blood pressure, new orders given. Will continue to monitor.

## 2015-03-29 ENCOUNTER — Inpatient Hospital Stay (HOSPITAL_COMMUNITY): Payer: 59

## 2015-03-29 LAB — BASIC METABOLIC PANEL
ANION GAP: 8 (ref 5–15)
BUN: <5 mg/dL — ABNORMAL LOW (ref 6–23)
CO2: 21 mmol/L (ref 19–32)
Calcium: 8.5 mg/dL (ref 8.4–10.5)
Chloride: 107 mmol/L (ref 96–112)
Creatinine, Ser: 0.47 mg/dL — ABNORMAL LOW (ref 0.50–1.10)
GFR calc non Af Amer: >90 mL/min (ref >90)
GLUCOSE: 101 mg/dL — AB (ref 70–99)
Potassium: 4.1 mmol/L (ref 3.5–5.1)
Sodium: 136 mmol/L (ref 135–145)

## 2015-03-29 LAB — CBC
HCT: 41.1 % (ref 36.0–46.0)
Hemoglobin: 13.6 g/dL (ref 12.0–15.0)
MCH: 31.5 pg (ref 26.0–34.0)
MCHC: 33.1 g/dL (ref 30.0–36.0)
MCV: 95.1 fL (ref 78.0–100.0)
Platelets: 288 10*3/uL (ref 150–400)
RBC: 4.32 MIL/uL (ref 3.87–5.11)
RDW: 14.1 % (ref 11.5–15.5)
WBC: 10.2 10*3/uL (ref 4.0–10.5)

## 2015-03-29 MED ORDER — ENALAPRILAT 1.25 MG/ML IV SOLN
1.2500 mg | Freq: Four times a day (QID) | INTRAVENOUS | Status: DC | PRN
  Filled 2015-03-29: qty 1

## 2015-03-29 MED ORDER — DEXTROSE 5 % IV SOLN
1000.0000 mg | Freq: Three times a day (TID) | INTRAVENOUS | Status: DC
  Administered 2015-03-29 – 2015-03-30 (×3): 1000 mg via INTRAVENOUS
  Filled 2015-03-29 (×4): qty 10

## 2015-03-29 MED ORDER — KETOROLAC TROMETHAMINE 30 MG/ML IJ SOLN
30.0000 mg | Freq: Three times a day (TID) | INTRAMUSCULAR | Status: DC | PRN
  Administered 2015-03-29: 30 mg via INTRAVENOUS
  Filled 2015-03-29: qty 1

## 2015-03-29 MED ORDER — BISACODYL 10 MG RE SUPP
10.0000 mg | Freq: Once | RECTAL | Status: AC
  Administered 2015-03-29: 10 mg via RECTAL
  Filled 2015-03-29: qty 1

## 2015-03-29 NOTE — Progress Notes (Signed)
Patient reports she has ambulated around the unit 6- 8 times today. She also reports passing flatus today.

## 2015-03-29 NOTE — Progress Notes (Signed)
  Subjective: No flatus/bm. Burping. Pain med not lasting long enough. Had HTN yesterday. No hx of HTN.   Objective: Vital signs in last 24 hours: Temp:  [97.7 F (36.5 C)-98.7 F (37.1 C)] 97.7 F (36.5 C) (04/24 0520) Pulse Rate:  [71-93] 71 (04/24 0520) Resp:  [18-22] 18 (04/24 0520) BP: (130-184)/(89-117) 130/89 mmHg (04/24 0717) SpO2:  [97 %-100 %] 100 % (04/24 0520) Last BM Date: 03/26/15  Intake/Output from previous day:   Intake/Output this shift:    Alert, nad, nontoxic cta b/l Reg Soft, protuberant abd. Incisions c/d/i; +binder, hypoBS  Lab Results:   Recent Labs  03/28/15 0435 03/29/15 0514  WBC 18.8* 10.2  HGB 13.9 13.6  HCT 41.1 41.1  PLT 305 288   BMET  Recent Labs  03/28/15 0435 03/29/15 0514  NA 141 136  K 3.3* 4.1  CL 98 107  CO2 30 21  GLUCOSE 137* 101*  BUN 7 <5*  CREATININE 0.54 0.47*  CALCIUM 9.7 8.5   PT/INR No results for input(s): LABPROT, INR in the last 72 hours. ABG No results for input(s): PHART, HCO3 in the last 72 hours.  Invalid input(s): PCO2, PO2  Studies/Results: Dg Abd 2 Views  03/29/2015   CLINICAL DATA:  Abdominal distention after ventral hernia surgery. Postoperative ileus.  Ileus K56.7 (ICD-10-CM)  EXAM: ABDOMEN - 2 VIEW  COMPARISON:  None.  FINDINGS: Persistent air-fluid levels in the colon. No plain film evidence of free air. Basilar atelectasis at the lung bases. Comparing yesterday's exam, there appears to be progression of stool into the descending colon and rectosigmoid. This is compatible with improving ileus.  IMPRESSION: Mild improvement in postoperative ileus.   Electronically Signed   By: Andreas NewportGeoffrey  Lamke M.D.   On: 03/29/2015 10:32   Dg Abd Acute W/chest  03/28/2015   CLINICAL DATA:  Acute onset of generalized abdominal pain, nausea and vomiting. Initial encounter.  EXAM: DG ABDOMEN ACUTE W/ 1V CHEST  COMPARISON:  Chest radiograph performed 10/23/2014, and abdominal radiograph from 05/02/2014  FINDINGS:  The lungs are well-aerated. Minimal bilateral atelectasis is noted. There is no evidence of pleural effusion or pneumothorax. The cardiomediastinal silhouette is within normal limits.  The visualized bowel gas pattern is nonspecific. Scattered stool and air are seen within the colon; apparent wall thickening is noted along a small bowel loop at the left mid abdomen, which may reflect mild ileitis. No free intra-abdominal air is identified on the provided upright view.  No acute osseous abnormalities are seen; the sacroiliac joints are unremarkable in appearance.  IMPRESSION: 1. Apparent wall thickening along a small bowel loop at the left mid abdomen, which may reflect a mild infectious or inflammatory ileitis. Otherwise unremarkable bowel gas pattern. No free intra-abdominal air seen. 2. Minimal bilateral atelectasis noted.   Electronically Signed   By: Roanna RaiderJeffery  Chang M.D.   On: 03/28/2015 05:29    Anti-infectives: Anti-infectives    None      Assessment/Plan: Ls/p LVHR Postop ileus - bowel gas pattern improving but still no flatus, vomited once yesterday after pain med. Will allow sips of liquids VTE prophylaxis - subcu heparin, scd, ambulate HTN - probably due to pain, prn vasotec Pain - add scheduled Robaxin, add prn toradol Will give suppository  Wbc normalized likely due to volume status  Mary SellaEric M. Andrey CampanileWilson, MD, FACS General, Bariatric, & Minimally Invasive Surgery [facility] [place]   LOS: 1 day    Atilano InaWILSON,Eunice Oldaker M 03/29/2015

## 2015-03-30 LAB — MAGNESIUM: Magnesium: 2 mg/dL (ref 1.5–2.5)

## 2015-03-30 LAB — CBC
HCT: 37.8 % (ref 36.0–46.0)
Hemoglobin: 12.6 g/dL (ref 12.0–15.0)
MCH: 31.1 pg (ref 26.0–34.0)
MCHC: 33.3 g/dL (ref 30.0–36.0)
MCV: 93.3 fL (ref 78.0–100.0)
PLATELETS: 249 10*3/uL (ref 150–400)
RBC: 4.05 MIL/uL (ref 3.87–5.11)
RDW: 13.6 % (ref 11.5–15.5)
WBC: 8.7 10*3/uL (ref 4.0–10.5)

## 2015-03-30 LAB — BASIC METABOLIC PANEL
Anion gap: 5 (ref 5–15)
BUN: <5 mg/dL — ABNORMAL LOW (ref 6–23)
CO2: 23 mmol/L (ref 19–32)
CREATININE: 0.42 mg/dL — AB (ref 0.50–1.10)
Calcium: 8.5 mg/dL (ref 8.4–10.5)
Chloride: 106 mmol/L (ref 96–112)
GFR calc Af Amer: >90 mL/min (ref >90)
Glucose, Bld: 113 mg/dL — ABNORMAL HIGH (ref 70–99)
POTASSIUM: 4 mmol/L (ref 3.5–5.1)
Sodium: 134 mmol/L — ABNORMAL LOW (ref 135–145)

## 2015-03-30 MED ORDER — MAGIC MOUTHWASH
15.0000 mL | Freq: Four times a day (QID) | ORAL | Status: DC | PRN
  Filled 2015-03-30: qty 15

## 2015-03-30 MED ORDER — BISACODYL 10 MG RE SUPP
10.0000 mg | Freq: Every day | RECTAL | Status: DC
  Administered 2015-03-30 – 2015-03-31 (×2): 10 mg via RECTAL
  Filled 2015-03-30 (×2): qty 1

## 2015-03-30 MED ORDER — SODIUM CHLORIDE 0.9 % IV SOLN: 250.0000 mL | INTRAVENOUS | Status: DC | PRN

## 2015-03-30 MED ORDER — ALUM & MAG HYDROXIDE-SIMETH 200-200-20 MG/5ML PO SUSP: 30.0000 mL | Freq: Four times a day (QID) | ORAL | Status: DC | PRN

## 2015-03-30 MED ORDER — KETOROLAC TROMETHAMINE 30 MG/ML IJ SOLN
30.0000 mg | Freq: Four times a day (QID) | INTRAMUSCULAR | Status: DC
  Administered 2015-03-30 – 2015-03-31 (×3): 30 mg via INTRAVENOUS
  Filled 2015-03-30 (×7): qty 1

## 2015-03-30 MED ORDER — LIP MEDEX EX OINT
1.0000 "application " | TOPICAL_OINTMENT | Freq: Two times a day (BID) | CUTANEOUS | Status: DC
  Administered 2015-03-30 – 2015-04-01 (×5): 1 via TOPICAL
  Filled 2015-03-30 (×2): qty 7

## 2015-03-30 MED ORDER — SODIUM CHLORIDE 0.9 % IJ SOLN: 3.0000 mL | INTRAMUSCULAR | Status: DC | PRN

## 2015-03-30 MED ORDER — METHOCARBAMOL 1000 MG/10ML IJ SOLN: 1000.0000 mg | Freq: Four times a day (QID) | INTRAVENOUS | Status: DC | PRN

## 2015-03-30 MED ORDER — MENTHOL 3 MG MT LOZG
1.0000 | LOZENGE | OROMUCOSAL | Status: DC | PRN
  Filled 2015-03-30: qty 9

## 2015-03-30 MED ORDER — SODIUM CHLORIDE 0.9 % IJ SOLN
3.0000 mL | Freq: Two times a day (BID) | INTRAMUSCULAR | Status: DC
  Administered 2015-03-30 – 2015-04-01 (×5): 3 mL via INTRAVENOUS

## 2015-03-30 MED ORDER — PHENOL 1.4 % MT LIQD
2.0000 | OROMUCOSAL | Status: DC | PRN
  Administered 2015-03-30: 2 via OROMUCOSAL
  Filled 2015-03-30: qty 177

## 2015-03-30 MED ORDER — OXYCODONE HCL 5 MG PO TABS
10.0000 mg | ORAL_TABLET | ORAL | Status: DC | PRN
  Administered 2015-03-30 – 2015-03-31 (×2): 10 mg via ORAL
  Filled 2015-03-30 (×2): qty 2

## 2015-03-30 NOTE — Progress Notes (Signed)
[facility]  [address] [place], [place] [address] Phone: [phone] FAX: [place] [phone] February 17, 1981  CARE TEAM:  PCP: Elyn Peers, MD  Outpatient Care Team: Patient Care Team: Lucianne Lei, MD as PCP - General (Family Medicine) Michael Boston, MD as Consulting Physician (General Surgery)  Inpatient Treatment Team: Treatment Team: Attending Provider: Michael Boston, MD; Registered Nurse: Benjamine Mola, RN; Technician: Suzanna Obey, NT; Technician: Ananias Pilgrim, NT; Technician: Lucas Mallow, NT; Registered Nurse: Thermon Leyland, RN  Problem List:   Principal Problem:   Ileus Active Problems:   Incarcerated ventral hernia s/p lap repair w mesh 4/70/9628   Umbilical hernia s/p lap repair w mesh 03/25/2014    PRE-OPERATIVE DIAGNOSIS: Incarcerated Ventral Wall Hernia  POST-OPERATIVE DIAGNOSIS:   Incarcerated ventral wall hernia x2 Umbilical hernia  PROCEDURE 03/26/2015:  LAPAROSCOPIC REPAIR OF INCARCERATED SUPERUMBILICAL HERNIA X2 AND UMBILICAL HERNIA REPAIR WITH MESH  SURGEON: Surgeon(s): Michael Boston, MD      Assessment  Ileus resolving  Plan:  -try inc liquids volume -supp.  Enema as backup -VTE prophylaxis- SCDs, etc -mobilize as tolerated to help recovery  Adin Hector, M.D., F.A.C.S. Gastrointestinal and Minimally Invasive Surgery [facility] [address] [place], [place] [address] [phone] Main / Paging   03/30/2015  Subjective:  Sore - binder & IV morphine help Walking more No more nausea/vomiting Anxious  Objective:  Vital signs:  Filed Vitals:   03/29/15 1630 03/29/15 2115 03/30/15 0035 03/30/15 0500  BP: 136/89 139/84 113/69 140/87  Pulse: 60 67 65 60  Temp: 98.1 F (36.7 C) 98 F (36.7 C) 97.8 F (36.6 C) 97.8 F (36.6 C)  TempSrc: Oral Oral Oral Oral  Resp: '16 16 20 20  ' Height:       Weight:      SpO2: 100% 100% 100% 100%    Last BM Date: 03/26/15 (prior to surgery)  Intake/Output   Yesterday:  04/24 0701 - 04/25 0700 In: 300 [P.O.:300] Out: -  This shift:     Bowel function:  Flatus: y  BM: n  Drain: n/a  Physical Exam:  General: Pt awake/alert/oriented x4 in no acute distress.  Moving a little slowly but not toxic Eyes: PERRL, normal EOM.  Sclera clear.  No icterus Neuro: CN II-XII intact w/o focal sensory/motor deficits. Lymph: No head/neck/groin lymphadenopathy Psych:  No delerium/psychosis/paranoia.  Mildly anxious but consolable. HENT: Normocephalic, Mucus membranes moist.  No thrush Neck: Supple, No tracheal deviation Chest: No chest wall pain w good excursion CV:  Pulses intact.  Regular rhythm MS: Normal AROM mjr joints.  No obvious deformity Abdomen: Soft.  Obese.  Mildly distended.  Dressings removed - incisions c/d/i.  Mildly tender at incisions only.  No evidence of peritonitis.  No incarcerated hernias. Ext:  SCDs BLE.  No mjr edema.  No cyanosis Skin: No petechiae / purpura  Results:   Labs: Results for orders placed or performed during the hospital encounter of 03/28/15 (from the past 48 hour(s))  Basic metabolic panel     Status: Abnormal   Collection Time: 03/29/15  5:14 AM  Result Value Ref Range   Sodium 136 135 - 145 mmol/L    Comment: RESULT REPEATED AND VERIFIED   Potassium 4.1 3.5 - 5.1 mmol/L    Comment: RESULT REPEATED AND VERIFIED DELTA CHECK NOTED    Chloride 107 96 - 112 mmol/L    Comment:  RESULT REPEATED AND VERIFIED DELTA CHECK NOTED    CO2 21 19 - 32 mmol/L    Comment: RESULT REPEATED AND VERIFIED   Glucose, Bld 101 (H) 70 - 99 mg/dL   BUN <5 (L) 6 - 23 mg/dL   Creatinine, Ser 0.47 (L) 0.50 - 1.10 mg/dL   Calcium 8.5 8.4 - 10.5 mg/dL    Comment: RESULT REPEATED AND VERIFIED   GFR calc non Af Amer >90 >90 mL/min   GFR calc Af Amer >90 >90 mL/min    Comment: (NOTE) The eGFR has been calculated using  the CKD EPI equation. This calculation has not been validated in all clinical situations. eGFR's persistently <90 mL/min signify possible Chronic Kidney Disease.    Anion gap 8 5 - 15  CBC     Status: None   Collection Time: 03/29/15  5:14 AM  Result Value Ref Range   WBC 10.2 4.0 - 10.5 K/uL   RBC 4.32 3.87 - 5.11 MIL/uL   Hemoglobin 13.6 12.0 - 15.0 g/dL   HCT 41.1 36.0 - 46.0 %   MCV 95.1 78.0 - 100.0 fL   MCH 31.5 26.0 - 34.0 pg   MCHC 33.1 30.0 - 36.0 g/dL   RDW 14.1 11.5 - 15.5 %   Platelets 288 150 - 400 K/uL  Basic metabolic panel     Status: Abnormal   Collection Time: 03/30/15  4:42 AM  Result Value Ref Range   Sodium 134 (L) 135 - 145 mmol/L   Potassium 4.0 3.5 - 5.1 mmol/L   Chloride 106 96 - 112 mmol/L   CO2 23 19 - 32 mmol/L   Glucose, Bld 113 (H) 70 - 99 mg/dL   BUN <5 (L) 6 - 23 mg/dL   Creatinine, Ser 0.42 (L) 0.50 - 1.10 mg/dL   Calcium 8.5 8.4 - 10.5 mg/dL   GFR calc non Af Amer >90 >90 mL/min   GFR calc Af Amer >90 >90 mL/min    Comment: (NOTE) The eGFR has been calculated using the CKD EPI equation. This calculation has not been validated in all clinical situations. eGFR's persistently <90 mL/min signify possible Chronic Kidney Disease.    Anion gap 5 5 - 15  Magnesium     Status: None   Collection Time: 03/30/15  4:42 AM  Result Value Ref Range   Magnesium 2.0 1.5 - 2.5 mg/dL  CBC     Status: None   Collection Time: 03/30/15  4:42 AM  Result Value Ref Range   WBC 8.7 4.0 - 10.5 K/uL   RBC 4.05 3.87 - 5.11 MIL/uL   Hemoglobin 12.6 12.0 - 15.0 g/dL   HCT 37.8 36.0 - 46.0 %   MCV 93.3 78.0 - 100.0 fL   MCH 31.1 26.0 - 34.0 pg   MCHC 33.3 30.0 - 36.0 g/dL   RDW 13.6 11.5 - 15.5 %   Platelets 249 150 - 400 K/uL    Imaging / Studies: Dg Abd 2 Views  03/29/2015   CLINICAL DATA:  Abdominal distention after ventral hernia surgery. Postoperative ileus.  Ileus K56.7 (ICD-10-CM)  EXAM: ABDOMEN - 2 VIEW  COMPARISON:  None.  FINDINGS: Persistent  air-fluid levels in the colon. No plain film evidence of free air. Basilar atelectasis at the lung bases. Comparing yesterday's exam, there appears to be progression of stool into the descending colon and rectosigmoid. This is compatible with improving ileus.  IMPRESSION: Mild improvement in postoperative ileus.   Electronically Signed   By: Cay Schillings  Lamke M.D.   On: 03/29/2015 10:32    Medications / Allergies: per chart  Antibiotics: Anti-infectives    None       Note: Portions of this report may have been transcribed using voice recognition software. Every effort was made to ensure accuracy; however, inadvertent computerized transcription errors may be present.   Any transcriptional errors that result from this process are unintentional.     Adin Hector, M.D., F.A.C.S. Gastrointestinal and Minimally Invasive Surgery [facility] [address] [place], [place] [address] [phone] Main / Paging   03/30/2015

## 2015-03-30 NOTE — Plan of Care (Signed)
Problem: Phase I Progression Outcomes Goal: OOB as tolerated unless otherwise ordered Outcome: Completed/Met Date Met:  03/30/15 Pt is walking in the halls frequently.

## 2015-03-31 MED ORDER — NAPROXEN 500 MG PO TABS
500.0000 mg | ORAL_TABLET | Freq: Two times a day (BID) | ORAL | Status: DC
  Administered 2015-03-31 – 2015-04-01 (×2): 500 mg via ORAL
  Filled 2015-03-31 (×4): qty 1

## 2015-03-31 MED ORDER — TRAMADOL HCL 50 MG PO TABS
50.0000 mg | ORAL_TABLET | Freq: Four times a day (QID) | ORAL | Status: DC | PRN
  Administered 2015-04-01: 100 mg via ORAL
  Filled 2015-03-31: qty 2

## 2015-03-31 MED ORDER — HYDROMORPHONE HCL 1 MG/ML IJ SOLN
0.5000 mg | INTRAMUSCULAR | Status: DC | PRN
  Administered 2015-03-31: 1 mg via INTRAVENOUS
  Administered 2015-03-31: 0.5 mg via INTRAVENOUS
  Administered 2015-03-31 – 2015-04-01 (×2): 1 mg via INTRAVENOUS
  Filled 2015-03-31 (×4): qty 1

## 2015-03-31 NOTE — Progress Notes (Signed)
Patient had a scant amount of stool  After soap suds.Will repeat procedure per order.

## 2015-03-31 NOTE — Progress Notes (Signed)
[facility]  [address] [place], [place] [address] Phone: [phone] FAX: [place] [phone] 06/10/1981  CARE TEAM:  PCP: Elyn Peers, MD  Outpatient Care Team: Patient Care Team: Lucianne Lei, MD as PCP - General (Family Medicine) Michael Boston, MD as Consulting Physician (General Surgery)  Inpatient Treatment Team: Treatment Team: Attending Provider: Michael Boston, MD; Registered Nurse: Benjamine Mola, RN; Technician: Ananias Pilgrim, NT; Technician: Trixie Deis, [place]; Registered Nurse: Ludwig Lean, RN; Technician: Lucas Mallow, NT  Problem List:   Principal Problem:   Ileus Active Problems:   Incarcerated ventral hernia s/p lap repair w mesh 05/26/6332   Umbilical hernia s/p lap repair w mesh 03/25/2014    PRE-OPERATIVE DIAGNOSIS: Incarcerated Ventral Wall Hernia  POST-OPERATIVE DIAGNOSIS:   Incarcerated ventral wall hernia x2 Umbilical hernia  PROCEDURE 03/26/2015:  LAPAROSCOPIC REPAIR OF INCARCERATED SUPERUMBILICAL HERNIA X2 AND UMBILICAL HERNIA REPAIR WITH MESH  SURGEON: Surgeon(s): Michael Boston, MD      Assessment  Ileus resolving  Plan:  -full liquids volume -Try enema as backup -switch pain regimen w edginess on oxy/hydrocodone.  Try tramadol -Xrays or CT if worse - less likely now -VTE prophylaxis- SCDs, etc -mobilize as tolerated to help recovery  Adin Hector, M.D., F.A.C.S. Gastrointestinal and Minimally Invasive Surgery [facility] [address], [place], [place] [address] [phone] Main / Paging   03/31/2015  Subjective:  Less sore RLQ w turning especially  - binder & narcotrics help but edgy w oxycodone.  Similar problem w hydrocodone Walking more No more nausea/vomiting Feeling much better  Objective:  Vital signs:  Filed Vitals:   03/30/15 1733 03/30/15 2127 03/31/15 0100 03/31/15 0644  BP:  133/84 150/89 108/69 123/82  Pulse: 64 60 74 76  Temp: 98.1 F (36.7 C) 97.7 F (36.5 C) 98.1 F (36.7 C) 97.8 F (36.6 C)  TempSrc: Oral Oral Oral Oral  Resp: _0 Height:      Weight:   60.3 kg (132 lb 15 oz)   SpO2: 100% 100% 100% 100%    Last BM Date: 03/26/15  Intake/Output   Yesterday:  04/25 0701 - 04/26 0700 In: 7276.8 [P.O.:1160; I.V.:6116.8] Out: 1100 [Urine:1100] This shift:     Bowel function:  Flatus: y  BM: n  Drain: n/a  Physical Exam:  General: Pt awake/alert/oriented x4 in no acute distress.  Moving normally, much more easily.  Not toxic Eyes: PERRL, normal EOM.  Sclera clear.  No icterus Neuro: CN II-XII intact w/o focal sensory/motor deficits. Lymph: No head/neck/groin lymphadenopathy Psych:  No delerium/psychosis/paranoia.  Smiling, chatty. HENT: Normocephalic, Mucus membranes moist.  No thrush Neck: Supple, No tracheal deviation Chest: No chest wall pain w good excursion CV:  Pulses intact.  Regular rhythm MS: Normal AROM mjr joints.  No obvious deformity Abdomen: Softer.  Overweight.  Mildly distended.  Incisions c/d/i.  Mildly tender at RLQ/LLQ incisions only.  No evidence of peritonitis.  No incarcerated hernias. Ext:  SCDs BLE.  No mjr edema.  No cyanosis Skin: No petechiae / purpura  Results:   Labs: Results for orders placed or performed during the hospital encounter of 03/28/15 (from the past 48 hour(s))  Basic metabolic panel     Status: Abnormal   Collection Time: 03/30/15  4:42 AM  Result Value Ref Range   Sodium 134 (L) 135 - 145 mmol/L   Potassium 4.0 3.5 -  5.1 mmol/L   Chloride 106 96 - 112 mmol/L   CO2 23 19 - 32 mmol/L   Glucose, Bld 113 (H) 70 - 99 mg/dL   BUN <5 (L) 6 - 23 mg/dL   Creatinine, Ser 0.42 (L) 0.50 - 1.10 mg/dL   Calcium 8.5 8.4 - 10.5 mg/dL   GFR calc non Af Amer >90 >90 mL/min   GFR calc Af Amer >90 >90 mL/min    Comment: (NOTE) The eGFR has been calculated using the CKD EPI equation. This  calculation has not been validated in all clinical situations. eGFR's persistently <90 mL/min signify possible Chronic Kidney Disease.    Anion gap 5 5 - 15  Magnesium     Status: None   Collection Time: 03/30/15  4:42 AM  Result Value Ref Range   Magnesium 2.0 1.5 - 2.5 mg/dL  CBC     Status: None   Collection Time: 03/30/15  4:42 AM  Result Value Ref Range   WBC 8.7 4.0 - 10.5 K/uL   RBC 4.05 3.87 - 5.11 MIL/uL   Hemoglobin 12.6 12.0 - 15.0 g/dL   HCT 37.8 36.0 - 46.0 %   MCV 93.3 78.0 - 100.0 fL   MCH 31.1 26.0 - 34.0 pg   MCHC 33.3 30.0 - 36.0 g/dL   RDW 13.6 11.5 - 15.5 %   Platelets 249 150 - 400 K/uL    Imaging / Studies: No results found.  Medications / Allergies: per chart  Antibiotics: Anti-infectives    None       Note: Portions of this report may have been transcribed using voice recognition software. Every effort was made to ensure accuracy; however, inadvertent computerized transcription errors may be present.   Any transcriptional errors that result from this process are unintentional.     Adin Hector, M.D., F.A.C.S. Gastrointestinal and Minimally Invasive Surgery [facility] [address] [place], [place] [address] [phone] Main / Paging   03/31/2015

## 2015-03-31 NOTE — Progress Notes (Signed)
Soap sude enema administered per order,tolerated 500cc,no BM at this time. RN will continue to monitor.

## 2015-03-31 NOTE — Progress Notes (Signed)
Patient had a small amount of watery stool after the second dose of soap suds enema.

## 2015-04-01 MED ORDER — BISACODYL 10 MG RE SUPP: 10.0000 mg | Freq: Two times a day (BID) | RECTAL | Status: DC | PRN

## 2015-04-01 MED ORDER — POLYETHYLENE GLYCOL 3350 17 G PO PACK
17.0000 g | PACK | Freq: Two times a day (BID) | ORAL | Status: DC
  Administered 2015-04-01: 17 g via ORAL
  Filled 2015-04-01: qty 1

## 2015-04-01 MED ORDER — TRAMADOL HCL 50 MG PO TABS: 50.0000 mg | ORAL_TABLET | Freq: Four times a day (QID) | ORAL | Status: DC | PRN

## 2015-04-01 MED ORDER — POLYETHYLENE GLYCOL 3350 17 G PO PACK: 17.0000 g | PACK | Freq: Two times a day (BID) | ORAL | Status: DC | PRN

## 2015-04-01 NOTE — Progress Notes (Signed)
Patient's d/c instructions given,she verbalized understanding,prescriptions handed to patient. Stable.Denies pain. In good spirits. Discharged at this time.

## 2015-04-01 NOTE — Discharge Instructions (Signed)
GETTING TO GOOD BOWEL HEALTH. Irregular bowel habits such as constipation and diarrhea can lead to many problems over time.  Having one soft bowel movement a day is the most important way to prevent further problems.  The anorectal canal is designed to handle stretching and feces to safely manage our ability to get rid of solid waste (feces, poop, stool) out of our body.  BUT, hard constipated stools can act like ripping concrete bricks and diarrhea can be a burning fire to this very sensitive area of our body, causing inflamed hemorrhoids, anal fissures, increasing risk is perirectal abscesses, abdominal pain/bloating, an making irritable bowel worse.     The goal: ONE SOFT BOWEL MOVEMENT A DAY!  To have soft, regular bowel movements:   Drink at least 8 tall glasses of water a day.    Take plenty of fiber.  Fiber is the undigested part of plant food that passes into the colon, acting s natures broom to encourage bowel motility and movement.  Fiber can absorb and hold large amounts of water. This results in a larger, bulkier stool, which is soft and easier to pass. Work gradually over several weeks up to 6 servings a day of fiber (25g a day even more if needed) in the form of: o Vegetables -- Root (potatoes, carrots, turnips), leafy green (lettuce, salad greens, celery, spinach), or cooked high residue (cabbage, broccoli, etc) o Fruit -- Fresh (unpeeled skin & pulp), Dried (prunes, apricots, cherries, etc ),  or stewed ( applesauce)  o Whole grain breads, pasta, etc (whole wheat)  o Bran cereals   Bulking Agents -- This type of water-retaining fiber generally is easily obtained each day by one of the following:  o Psyllium bran -- The psyllium plant is remarkable because its ground seeds can retain so much water. This product is available as Metamucil, Konsyl, Effersyllium, Per Diem Fiber, or the less expensive generic preparation in drug and health food stores. Although labeled a laxative, it really  is not a laxative.  o Methylcellulose -- This is another fiber derived from wood which also retains water. It is available as Citrucel. o Polyethylene Glycol - and artificial fiber commonly called Miralax or Glycolax.  It is helpful for people with gassy or bloated feelings with regular fiber o Flax Seed - a less gassy fiber than psyllium  No reading or other relaxing activity while on the toilet. If bowel movements take longer than 5 minutes, you are too constipated  AVOID CONSTIPATION.  High fiber and water intake usually takes care of this.  Sometimes a laxative is needed to stimulate more frequent bowel movements, but   Laxatives are not a good long-term solution as it can wear the colon out. o Osmotics (Milk of Magnesia, Fleets phosphosoda, Magnesium citrate, MiraLax, GoLytely) are safer than  o Stimulants (Senokot, Castor Oil, Dulcolax, Ex Lax)    o Do not take laxatives for more than 7days in a row.   IF SEVERELY CONSTIPATED, try a Bowel Retraining Program: o Do not use laxatives.  o Eat a diet high in roughage, such as bran cereals and leafy vegetables.  o Drink six (6) ounces of prune or apricot juice each morning.  o Eat two (2) large servings of stewed fruit each day.  o Take one (1) heaping tablespoon of a psyllium-based bulking agent twice a day. Use sugar-free sweetener when possible to avoid excessive calories.  o Eat a normal breakfast.  o Set aside 15 minutes after breakfast to  sit on the toilet, but do not strain to have a bowel movement.  o If you do not have a bowel movement by the third day, use an enema and repeat the above steps.   Controlling diarrhea o Switch to liquids and simpler foods for a few days to avoid stressing your intestines further. o Avoid dairy products (especially milk & ice cream) for a short time.  The intestines often can lose the ability to digest lactose when stressed. o Avoid foods that cause gassiness or bloating.  Typical foods include  beans and other legumes, cabbage, broccoli, and dairy foods.  Every person has some sensitivity to other foods, so listen to our body and avoid those foods that trigger problems for you. o Adding fiber (Citrucel, Metamucil, psyllium, Miralax) gradually can help thicken stools by absorbing excess fluid and retrain the intestines to act more normally.  Slowly increase the dose over a few weeks.  Too much fiber too soon can backfire and cause cramping & bloating. o Probiotics (such as active yogurt, Align, etc) may help repopulate the intestines and colon with normal bacteria and calm down a sensitive digestive tract.  Most studies show it to be of mild help, though, and such products can be costly. o Medicines: - Bismuth subsalicylate (ex. Kayopectate, Pepto Bismol) every 30 minutes for up to 6 doses can help control diarrhea.  Avoid if pregnant. - Loperamide (Immodium) can slow down diarrhea.  Start with two tablets (4mg  total) first and then try one tablet every 6 hours.  Avoid if you are having fevers or severe pain.  If you are not better or start feeling worse, stop all medicines and call your doctor for advice o Call your doctor if you are getting worse or not better.  Sometimes further testing (cultures, endoscopy, X-ray studies, bloodwork, etc) may be needed to help diagnose and treat the cause of the diarrhea.  Managing Pain  Pain after surgery or related to activity is often due to strain/injury to muscle, tendon, nerves and/or incisions.  This pain is usually short-term and will improve in a few months.   Many people find it helpful to do the following things TOGETHER to help speed the process of healing and to get back to regular activity more quickly:  1. Avoid heavy physical activity at first a. No lifting greater than 20 pounds at first, then increase to lifting as tolerated over the next few weeks b. Do not push through the pain.  Listen to your body and avoid positions and maneuvers  than reproduce the pain.  Wait a few days before trying something more intense c. Walking is okay as tolerated, but go slowly and stop when getting sore.  If you can walk 30 minutes without stopping or pain, you can try more intense activity (running, jogging, aerobics, cycling, swimming, treadmill, sex, sports, weightlifting, etc ) d. Remember: If it hurts to do it, then dont do it!  2. Take Anti-inflammatory medication a. Choose ONE of the following over-the-counter medications: i.            Acetaminophen 500mg  tabs (Tylenol) 1-2 pills with every meal and just before bedtime (avoid if you have liver problems) ii.            Naproxen 220mg  tabs (ex. Aleve) 1-2 pills twice a day (avoid if you have kidney, stomach, IBD, or bleeding problems) iii. Ibuprofen 200mg  tabs (ex. Advil, Motrin) 3-4 pills with every meal and just before bedtime (avoid if you  have kidney, stomach, IBD, or bleeding problems) b. Take with food/snack around the clock for 1-2 weeks i. This helps the muscle and nerve tissues become less irritable and calm down faster  3. Use a Heating pad or Ice/Cold Pack a. 4-6 times a day b. May use warm bath/hottub  or showers  4. Try Gentle Massage and/or Stretching  a. at the area of pain many times a day b. stop if you feel pain - do not overdo it  Try these steps together to help you body heal faster and avoid making things get worse.  Doing just one of these things may not be enough.    If you are not getting better after two weeks or are noticing you are getting worse, contact our office for further advice; we may need to re-evaluate you & see what other things we can do to help.  HERNIA REPAIR: POST OP INSTRUCTIONS  1. DIET: Follow a light bland diet the first 24 hours after arrival home, such as soup, liquids, crackers, etc.  Be sure to include lots of fluids daily.  Avoid fast food or heavy meals as your are more likely to get nauseated.  Eat a low fat the next few days after  surgery. 2. Take your usually prescribed home medications unless otherwise directed. 3. PAIN CONTROL: a. Pain is best controlled by a usual combination of three different methods TOGETHER: i. Ice/Heat ii. Over the counter pain medication iii. Prescription pain medication b. Most patients will experience some swelling and bruising around the hernia(s) such as the bellybutton, groins, or old incisions.  Ice packs or heating pads (30-60 minutes up to 6 times a day) will help. Use ice for the first few days to help decrease swelling and bruising, then switch to heat to help relax tight/sore spots and speed recovery.  Some people prefer to use ice alone, heat alone, alternating between ice & heat.  Experiment to what works for you.  Swelling and bruising can take several weeks to resolve.   c. It is helpful to take an over-the-counter pain medication regularly for the first few weeks.  Choose one of the following that works best for you: i. Naproxen (Aleve, etc)  Two  tabs twice a day ii. Ibuprofen (Advil, etc) Three  tabs four times a day (every meal & bedtime) iii. Acetaminophen (Tylenol, etc) 325-650mg  four times a day (every meal & bedtime) d. A  prescription for pain medication should be given to you upon discharge.  Take your pain medication as prescribed.  i. If you are having problems/concerns with the prescription medicine (does not control pain, nausea, vomiting, rash, itching, etc), please call us [phone] to see if we need to switch you to a different pain medicine that will work better for you and/or control your side effect better. ii. If you need a refill on your pain medication, please contact your pharmacy.  They will contact our office to request authorization. Prescriptions will not be filled after 5 pm or on week-ends. 4. Avoid getting constipated.  Between the surgery and the pain medications, it is common to experience some constipation.  Increasing fluid intake and  taking a fiber supplement (such as Metamucil, Citrucel, FiberCon, MiraLax, etc) 1-2 times a day regularly will usually help prevent this problem from occurring.  A mild laxative (prune juice, Milk of Magnesia, MiraLax, etc) should be taken according to package directions if there are no bowel movements after 48 hours.   5. Wash /  shower every day.  You may shower over the dressings as they are waterproof.   6. Remove your waterproof bandages 5 days after surgery.  You may leave the incision open to air.  You may replace a dressing/Band-Aid to cover the incision for comfort if you wish.  Continue to shower over incision(s) after the dressing is off.    7. ACTIVITIES as tolerated:   a. You may resume regular (light) daily activities beginning the next day--such as daily self-care, walking, climbing stairs--gradually increasing activities as tolerated.  If you can walk 30 minutes without difficulty, it is safe to try more intense activity such as jogging, treadmill, bicycling, low-impact aerobics, swimming, etc. b. Save the most intensive and strenuous activity for last such as sit-ups, heavy lifting, contact sports, etc  Refrain from any heavy lifting or straining until you are off narcotics for pain control.   c. DO NOT PUSH THROUGH PAIN.  Let pain be your guide: If it hurts to do something, don't do it.  Pain is your body warning you to avoid that activity for another week until the pain goes down. d. You may drive when you are no longer taking prescription pain medication, you can comfortably wear a seatbelt, and you can safely maneuver your car and apply brakes. e. Bonita QuinYou may have sexual intercourse when it is comfortable.  8. FOLLOW UP in our office a. Please call [facility] at [phone] to set up an appointment to see your surgeon in the office for a follow-up appointment approximately 2-3 weeks after your surgery. b. Make sure that you call for this appointment the day you arrive home to insure a  convenient appointment time. 9.  IF YOU HAVE DISABILITY OR FAMILY LEAVE FORMS, BRING THEM TO THE OFFICE FOR PROCESSING.  DO NOT GIVE THEM TO YOUR DOCTOR.  WHEN TO CALL US [phone]: 1. Poor pain control 2. Reactions / problems with new medications (rash/itching, nausea, etc)  3. Fever over 101.5 F (38.5 C) 4. Inability to urinate 5. Nausea and/or vomiting 6. Worsening swelling or bruising 7. Continued bleeding from incision. 8. Increased pain, redness, or drainage from the incision   The clinic staff is available to answer your questions during regular business hours (8:30am-5pm).  Please dont hesitate to call and ask to speak to one of our nurses for clinical concerns.   If you have a medical emergency, go to the nearest emergency room or call 911.  A surgeon from [facility] is always on call at the hospitals in [facility]  [facility], [place] [address], [address], [place], [place]  [id number] ?  [address] [address], [place], [place]   [id number] MAIN: [phone] ? TOLL FREE: [phone] ? FAX: [phone] [email or web address]  Hernia Repair with Laparoscope A hernia occurs when an internal organ pushes out through a weak spot in the belly (abdominal) wall muscles. Hernias most commonly occur in the groin and around the navel. Hernias can also occur through a cut by the surgeon (incision) after an abdominal operation. A hernia may be caused by:  Lifting heavy objects.  Prolonged coughing.  Straining to move your bowels. Hernias can often be pushed back into place (reduced). Most hernias tend to get worse over time. Problems occur when abdominal contents get stuck in the opening and the blood supply is blocked or impaired (incarcerated hernia). Because of these risks, you require surgery to repair the hernia. Your hernia will be repaired using a laparoscope. Laparoscopic  surgery is a type of minimally invasive surgery. It does not involve  making a typical surgical cut (incision) in the skin. A laparoscope is a telescope-like rod and lens system. It is usually connected to a video camera and a light source so your caregiver can clearly see the operative area. The instruments are inserted through  to  inch (5 mm or 10 mm) openings in the skin at specific locations. A working and viewing space is created by blowing a small amount of carbon dioxide gas into the abdominal cavity. The abdomen is essentially blown up like a balloon (insufflated). This elevates the abdominal wall above the internal organs like a dome. The carbon dioxide gas is common to the human body and can be absorbed by tissue and removed by the respiratory system. Once the repair is completed, the small incisions will be closed with either stitches (sutures) or staples (just like a paper stapler only this staple holds the skin together). LET YOUR CAREGIVERS KNOW ABOUT:  Allergies.  Medications taken including herbs, eye drops, over the counter medications, and creams.  Use of steroids (by mouth or creams).  Previous problems with anesthetics or Novocaine.  Possibility of pregnancy, if this applies.  History of blood clots (thrombophlebitis).  History of bleeding or blood problems.  Previous surgery.  Other health problems. BEFORE THE PROCEDURE  Laparoscopy can be done either in a hospital or out-patient clinic. You may be given a mild sedative to help you relax before the procedure. Once in the operating room, you will be given a general anesthesia to make you sleep (unless you and your caregiver choose a different anesthetic).  AFTER THE PROCEDURE  After the procedure you will be watched in a recovery area. Depending on what type of hernia was repaired, you might be admitted to the hospital or you might go home the same day. With this procedure you may have less pain and scarring. This usually results in a quicker recovery and less risk of infection. HOME CARE  INSTRUCTIONS   Bed rest is not required. You may continue your normal activities but avoid heavy lifting (more than 10 pounds) or straining.  Cough gently. If you are a smoker it is best to stop, as even the best hernia repair can break down with the continual strain of coughing.  Avoid driving until given the OK by your surgeon.  There are no dietary restrictions unless given otherwise.  TAKE ALL MEDICATIONS AS DIRECTED.  Only take over-the-counter or prescription medicines for pain, discomfort, or fever as directed by your caregiver. SEEK MEDICAL CARE IF:   There is increasing abdominal pain or pain in your incisions.  There is more bleeding from incisions, other than minimal spotting.  You feel light headed or faint.  You develop an unexplained fever, chills, and/or an oral temperature above 102 F (38.9 C).  You have redness, swelling, or increasing pain in the wound.  Pus coming from wound.  A foul smell coming from the wound or dressings. SEEK IMMEDIATE MEDICAL CARE IF:   You develop a rash.  You have difficulty breathing.  You have any allergic problems. MAKE SURE YOU:   Understand these instructions.  Will watch your condition.  Will get help right away if you are not doing well or get worse. Document Released: 11/21/2005 Document Revised: 02/13/2012 Document Reviewed: 10/21/2009 [facility] Patient Information 2015 [place], [place]. This information is not intended to replace advice given to you by your health care provider. Make sure you  discuss any questions you have with your health care provider. ° °

## 2015-04-01 NOTE — Progress Notes (Signed)
Patient tolerated breakfast,no c/o nausea nor vomiting. Was given Ultram 100 mg po earlier this am pain level from 10/10 down to 7/10. Patient ambulated in the hallway without difficulty. Had passed gas per patient. RN to continue to monitor.

## 2015-04-01 NOTE — Discharge Summary (Signed)
Physician Discharge Summary  Patient ID: [name] MRN: [id number] DOB/AGE: 02-26-1981 34 y.o.  Admit date: 03/28/2015 Discharge date: 04/01/2015  Patient Care Team: Lucianne Lei, MD as PCP - General (Family Medicine) Michael Boston, MD as Consulting Physician (General Surgery)  Admission Diagnoses: Principal Problem:   Ileus Active Problems:   Incarcerated ventral hernia s/p lap repair w mesh 5/37/4827   Umbilical hernia s/p lap repair w mesh 03/25/2014   Discharge Diagnoses:  Principal Problem:   Ileus Active Problems:   Incarcerated ventral hernia s/p lap repair w mesh 0/78/6754   Umbilical hernia s/p lap repair w mesh 03/25/2014   POST-OPERATIVE DIAGNOSIS:  * No surgery found *  SURGERY:    SURGEON:    Consults: None  Hospital Course:   The patient underwent lap Carlin surgery last week.  Returned with abdominal swelling, nausea/vomiting, and constipation.  Admitted.  Hydrated.  ade nothing by mouth.  Began to feel better.  Began have some flatus.  After suppositories & enema began to have better  bowel movements.  Advanced on diet. Feeling much better.  The patient gradually mobilized and advanced to a solid diet.  Pain and other symptoms were treated aggressively.    By the time of discharge, the patient was walking well the hallways, eating food, having flatus with BMs.  Pain was well-controlled on an oral medications.  Based on meeting discharge criteria and continuing to recover, I felt it was safe for the patient to be discharged from the hospital to further recover with close followup. Postoperative recommendations were discussed in detail.  They are written as well.   Significant Diagnostic Studies:  Results for orders placed or performed during the hospital encounter of 03/28/15 (from the past 72 hour(s))  Basic metabolic panel     Status: Abnormal   Collection Time: 03/30/15  4:42 AM  Result Value Ref Range   Sodium 134 (L) 135 - 145 mmol/L   Potassium  4.0 3.5 - 5.1 mmol/L   Chloride 106 96 - 112 mmol/L   CO2 23 19 - 32 mmol/L   Glucose, Bld 113 (H) 70 - 99 mg/dL   BUN <5 (L) 6 - 23 mg/dL   Creatinine, Ser 0.42 (L) 0.50 - 1.10 mg/dL   Calcium 8.5 8.4 - 10.5 mg/dL   GFR calc non Af Amer >90 >90 mL/min   GFR calc Af Amer >90 >90 mL/min    Comment: (NOTE) The eGFR has been calculated using the CKD EPI equation. This calculation has not been validated in all clinical situations. eGFR's persistently <90 mL/min signify possible Chronic Kidney Disease.    Anion gap 5 5 - 15  Magnesium     Status: None   Collection Time: 03/30/15  4:42 AM  Result Value Ref Range   Magnesium 2.0 1.5 - 2.5 mg/dL  CBC     Status: None   Collection Time: 03/30/15  4:42 AM  Result Value Ref Range   WBC 8.7 4.0 - 10.5 K/uL   RBC 4.05 3.87 - 5.11 MIL/uL   Hemoglobin 12.6 12.0 - 15.0 g/dL   HCT 37.8 36.0 - 46.0 %   MCV 93.3 78.0 - 100.0 fL   MCH 31.1 26.0 - 34.0 pg   MCHC 33.3 30.0 - 36.0 g/dL   RDW 13.6 11.5 - 15.5 %   Platelets 249 150 - 400 K/uL    Dg Abd 2 Views  03/29/2015   CLINICAL DATA:  Abdominal distention after ventral hernia surgery. Postoperative ileus.  Ileus K56.7 (ICD-10-CM)  EXAM: ABDOMEN - 2 VIEW  COMPARISON:  None.  FINDINGS: Persistent air-fluid levels in the colon. No plain film evidence of free air. Basilar atelectasis at the lung bases. Comparing yesterday's exam, there appears to be progression of stool into the descending colon and rectosigmoid. This is compatible with improving ileus.  IMPRESSION: Mild improvement in postoperative ileus.   Electronically Signed   By: Dereck Ligas M.D.   On: 03/29/2015 10:32    Discharge Exam: Blood pressure 98/76, pulse 84, temperature 97.5 F (36.4 C), temperature source Oral, resp. rate 16, height 5' (1.524 m), weight 60.3 kg (132 lb 15 oz), last menstrual period 03/16/2015, SpO2 100 %.  General: Pt awake/alert/oriented x4 in no major acute distress.  Smiling, moving around room  easily Eyes: PERRL, normal EOM. Sclera nonicteric Neuro: CN II-XII intact w/o focal sensory/motor deficits. Lymph: No head/neck/groin lymphadenopathy Psych:  No delerium/psychosis/paranoia HENT: Normocephalic, Mucus membranes moist.  No thrush Neck: Supple, No tracheal deviation Chest: No pain.  Good respiratory excursion. CV:  Pulses intact.  Regular rhythm MS: Normal AROM mjr joints.  No obvious deformity Abdomen: Soft, Nondistended.  Nontender.  No incarcerated hernias. Ext:  SCDs BLE.  No significant edema.  No cyanosis Skin: No petechiae / purpura  Discharged Condition: good   Past Medical History  Diagnosis Date  . Ectopic pregnancy   . Urinary tract infection   . Ovarian cyst   . Infection     UTI  . Back pain, chronic   . Dysrhythmia     hx of a-fib  . Hypertension     was told, but never on meds  . Heart murmur   . Depression     hx of   . GERD (gastroesophageal reflux disease)   . Headache     occasional     Past Surgical History  Procedure Laterality Date  . Etopical surgery    . Cleft palate repair    . Laparoscopy for ectopic pregnancy  2011  . Ventral hernia repair N/A 03/26/2015    Procedure: LAPAROSCOPIC REPAIR OF INCARCERATED SUPERUMBILICAL HERNIA X2 AND UMBILICAL HERNIA REPAIR WITH MESH;  Surgeon: Michael Boston, MD;  Location: [facility];  Service: General;  Laterality: N/A;    History   Social History  . Marital Status: Single    Spouse Name: N/A  . Number of Children: N/A  . Years of Education: N/A   Occupational History  . Not on file.   Social History Main Topics  . Smoking status: Former Smoker -- 0.25 packs/day for 15 years    Types: Cigarettes    Quit date: 10/02/2014  . Smokeless tobacco: Never Used  . Alcohol Use: No     Comment: hx of occasiional use   . Drug Use: No  . Sexual Activity: Yes    Birth Control/ Protection: None   Other Topics Concern  . Not on file   Social History Narrative    Family History  Problem  Relation Age of Onset  . Other Neg Hx   . Hearing loss Neg Hx   . Hypertension Mother   . Hypertension Father   . Cancer Sister   . Heart disease Maternal Grandmother   . Cancer Maternal Grandfather     Current Facility-Administered Medications  Medication Dose Route Frequency Provider Last Rate Last Dose  . 0.9 %  sodium chloride infusion  250 mL Intravenous PRN Michael Boston, MD      . alum & Iris Pert  hydroxide-simeth (MAALOX/MYLANTA) 200-200-20 MG/5ML suspension 30 mL  30 mL Oral Q6H PRN Michael Boston, MD      . bisacodyl (DULCOLAX) suppository 10 mg  10 mg Rectal Q12H PRN Michael Boston, MD      . enalaprilat (VASOTEC) injection 1.25 mg  1.25 mg Intravenous Q6H PRN Greer Pickerel, MD      . heparin injection 5,000 Units  5,000 Units Subcutaneous 3 times per day Alphonsa Overall, MD   5,000 Units at 04/01/15 0553  . HYDROmorphone (DILAUDID) injection 0.5-2 mg  0.5-2 mg Intravenous Q2H PRN Michael Boston, MD   1 mg at 04/01/15 0345  . lip balm (CARMEX) ointment 1 application  1 application Topical BID Michael Boston, MD   1 application at 42/35/36 2142  . magic mouthwash  15 mL Oral QID PRN Michael Boston, MD      . menthol-cetylpyridinium (CEPACOL) lozenge 3 mg  1 lozenge Oral PRN Michael Boston, MD      . methocarbamol (ROBAXIN) 1,000 mg in dextrose 5 % 50 mL IVPB  1,000 mg Intravenous Q6H PRN Michael Boston, MD      . naproxen (NAPROSYN) tablet 500 mg  500 mg Oral BID WC Michael Boston, MD   500 mg at 03/31/15 1640  . ondansetron (ZOFRAN) injection 4 mg  4 mg Intravenous Q6H PRN Alphonsa Overall, MD   4 mg at 03/28/15 1836  . phenol (CHLORASEPTIC) mouth spray 2 spray  2 spray Mouth/Throat PRN Michael Boston, MD   2 spray at 03/30/15 1548  . polyethylene glycol (MIRALAX / GLYCOLAX) packet 17 g  17 g Oral BID Michael Boston, MD      . polyethylene glycol (MIRALAX / GLYCOLAX) packet 17 g  17 g Oral Q12H PRN Michael Boston, MD      . sodium chloride 0.9 % injection 3 mL  3 mL Intravenous Q12H Michael Boston, MD   3 mL at  03/31/15 2142  . sodium chloride 0.9 % injection 3 mL  3 mL Intravenous PRN Michael Boston, MD      . traMADol Veatrice Bourbon) tablet 50-100 mg  50-100 mg Oral Q6H PRN Michael Boston, MD         Allergies  Allergen Reactions  . Flexeril [Cyclobenzaprine] Hives    Disposition: 01-Home or Self Care  Discharge Instructions    Call MD for:  extreme fatigue    Complete by:  As directed      Call MD for:  hives    Complete by:  As directed      Call MD for:  persistant nausea and vomiting    Complete by:  As directed      Call MD for:  redness, tenderness, or signs of infection (pain, swelling, redness, odor or green/yellow discharge around incision site)    Complete by:  As directed      Call MD for:  severe uncontrolled pain    Complete by:  As directed      Call MD for:    Complete by:  As directed   Temperature > 101.55F     Diet - low sodium heart healthy    Complete by:  As directed      Discharge instructions    Complete by:  As directed   Please see discharge instruction sheets.  Also refer to handout given an office.  Please call our office if you have any questions or concerns [phone]     Discharge wound care:    Complete by:  As directed   If you have closed incisions, shower and bathe over these incisions with soap and water every day.  Remove all surgical dressings on postoperative day #3.  You do not need to replace dressings over the closed incisions unless you feel more comfortable with a Band-Aid covering it.   If you have an open wound that requires packing, please see wound care instructions.  In general, remove all dressings, wash wound with soap and water and then replace with saline moistened gauze.  Do the dressing change at least every day.  Please call our office [phone] if you have further questions.     Driving Restrictions    Complete by:  As directed   No driving until off narcotics and can safely swerve away without pain during an emergency     Increase  activity slowly    Complete by:  As directed   Walk an hour a day.  Use 20-30 minute walks.  When you can walk 30 minutes without difficulty, increase to low impact/moderate activities such as biking, jogging, swimming, sexual activity..  Eventually can increase to unrestricted activity when not feeling pain.  If you feel pain: STOP!Marland Kitchen   Let pain protect you from overdoing it.  Use ice/heat/over-the-counter pain medications to help minimize his soreness.  Use pain prescriptions as needed to remain active.  It is better to take extra pain medications and be more active than to stay bedridden to avoid all pain medications.     Lifting restrictions    Complete by:  As directed   Avoid heavy lifting initially.  Do not push through pain.  You have no specific weight limit.  Coughing and sneezing or four more stressful to your incision than any lifting you will do. Pain will protect you from injury.  Therefore, avoid intense activity until off all narcotic pain medications.  Coughing and sneezing or four more stressful to your incision than any lifting he will do.     May shower / Bathe    Complete by:  As directed      May walk up steps    Complete by:  As directed      Sexual Activity Restrictions    Complete by:  As directed   Sexual activity as tolerated.  Do not push through pain.  Pain will protect you from injury.     Walk with assistance    Complete by:  As directed   Walk over an hour a day.  May use a walker/cane/companion to help with balance and stamina.            Medication List    STOP taking these medications        oxyCODONE 5 MG immediate release tablet  Commonly known as:  Oxy IR/ROXICODONE      TAKE these medications        divalproex 500 MG 24 hr tablet  Commonly known as:  DEPAKOTE ER  Take 2 tablets (1,000 mg total) by mouth at bedtime. For mood stabilization.     ibuprofen 600 MG tablet  Commonly known as:  ADVIL,MOTRIN  Take 1 tablet (600 mg total) by mouth  every 6 (six) hours as needed for headache or moderate pain.     methocarbamol 750 MG tablet  Commonly known as:  ROBAXIN  Take 1 tablet (750 mg total) by mouth every 6 (six) hours as needed for muscle spasms.     traMADol 50 MG tablet  Commonly known  as:  ULTRAM  Take 1-2 tablets (50-100 mg total) by mouth every 6 (six) hours as needed for moderate pain or severe pain.           Follow-up Information    Follow up with Susie Ehresman C., MD In 2 weeks.   Specialty:  General Surgery   Why:  To follow up after your hospital stay, To follow up after your operation   Contact information:   [place] [address] [phone]        Signed: Morton Peters, M.D., F.A.C.S. Gastrointestinal and Minimally Invasive Surgery [facility] [address] [place], [place] [address] [phone] Main / Paging   04/01/2015, 7:38 AM

## 2015-06-06 ENCOUNTER — Emergency Department (HOSPITAL_COMMUNITY)
Admission: EM | Admit: 2015-06-06 | Discharge: 2015-06-06 | Disposition: A | Discharge status: Home / Self Care | Payer: 59 | Attending: Emergency Medicine | Admitting: Emergency Medicine

## 2015-06-06 ENCOUNTER — Encounter (HOSPITAL_COMMUNITY): Payer: Self-pay | Admitting: Emergency Medicine

## 2015-06-06 ENCOUNTER — Emergency Department (HOSPITAL_COMMUNITY): Payer: 59

## 2015-06-06 DIAGNOSIS — R1011 Right upper quadrant pain: Secondary | ICD-10-CM | POA: Diagnosis not present

## 2015-06-06 DIAGNOSIS — Z87440 Personal history of urinary (tract) infections: Secondary | ICD-10-CM | POA: Insufficient documentation

## 2015-06-06 DIAGNOSIS — Z87891 Personal history of nicotine dependence: Secondary | ICD-10-CM | POA: Insufficient documentation

## 2015-06-06 DIAGNOSIS — I4891 Unspecified atrial fibrillation: Secondary | ICD-10-CM | POA: Insufficient documentation

## 2015-06-06 DIAGNOSIS — H9201 Otalgia, right ear: Secondary | ICD-10-CM | POA: Diagnosis present

## 2015-06-06 DIAGNOSIS — I10 Essential (primary) hypertension: Secondary | ICD-10-CM | POA: Insufficient documentation

## 2015-06-06 DIAGNOSIS — Z8719 Personal history of other diseases of the digestive system: Secondary | ICD-10-CM | POA: Diagnosis not present

## 2015-06-06 DIAGNOSIS — Z8742 Personal history of other diseases of the female genital tract: Secondary | ICD-10-CM | POA: Diagnosis not present

## 2015-06-06 DIAGNOSIS — R197 Diarrhea, unspecified: Secondary | ICD-10-CM | POA: Diagnosis not present

## 2015-06-06 DIAGNOSIS — F329 Major depressive disorder, single episode, unspecified: Secondary | ICD-10-CM | POA: Insufficient documentation

## 2015-06-06 DIAGNOSIS — Z3202 Encounter for pregnancy test, result negative: Secondary | ICD-10-CM | POA: Diagnosis not present

## 2015-06-06 DIAGNOSIS — R011 Cardiac murmur, unspecified: Secondary | ICD-10-CM | POA: Diagnosis not present

## 2015-06-06 DIAGNOSIS — G8929 Other chronic pain: Secondary | ICD-10-CM | POA: Diagnosis not present

## 2015-06-06 DIAGNOSIS — H6091 Unspecified otitis externa, right ear: Secondary | ICD-10-CM

## 2015-06-06 LAB — COMPREHENSIVE METABOLIC PANEL
ALT: 24 U/L (ref 14–54)
AST: 22 U/L (ref 15–41)
Albumin: 4.2 g/dL (ref 3.5–5.0)
Alkaline Phosphatase: 55 U/L (ref 38–126)
Anion gap: 8 (ref 5–15)
BUN: 10 mg/dL (ref 6–20)
CO2: 25 mmol/L (ref 22–32)
CREATININE: 0.53 mg/dL (ref 0.44–1.00)
Calcium: 9.1 mg/dL (ref 8.9–10.3)
Chloride: 107 mmol/L (ref 101–111)
GFR calc Af Amer: >60 mL/min (ref >60)
GFR calc non Af Amer: >60 mL/min (ref >60)
GLUCOSE: 93 mg/dL (ref 65–99)
Potassium: 3.9 mmol/L (ref 3.5–5.1)
Sodium: 140 mmol/L (ref 135–145)
Total Bilirubin: 0.4 mg/dL (ref 0.3–1.2)
Total Protein: 7.3 g/dL (ref 6.5–8.1)

## 2015-06-06 LAB — URINALYSIS, ROUTINE W REFLEX MICROSCOPIC
Bilirubin Urine: NEGATIVE
GLUCOSE, UA: NEGATIVE mg/dL
HGB URINE DIPSTICK: NEGATIVE
KETONES UR: NEGATIVE mg/dL
LEUKOCYTES UA: NEGATIVE
Nitrite: NEGATIVE
Protein, ur: NEGATIVE mg/dL
SPECIFIC GRAVITY, URINE: 1.016 (ref 1.005–1.030)
Urobilinogen, UA: 0.2 mg/dL (ref 0.0–1.0)
pH: 6.5 (ref 5.0–8.0)

## 2015-06-06 LAB — CBC WITH DIFFERENTIAL/PLATELET
Basophils Absolute: 0 10*3/uL (ref 0.0–0.1)
Basophils Relative: 1 % (ref 0–1)
EOS ABS: 0.1 10*3/uL (ref 0.0–0.7)
Eosinophils Relative: 2 % (ref 0–5)
HCT: 39.1 % (ref 36.0–46.0)
Hemoglobin: 13.3 g/dL (ref 12.0–15.0)
LYMPHS PCT: 29 % (ref 12–46)
Lymphs Abs: 1.8 10*3/uL (ref 0.7–4.0)
MCH: 31.3 pg (ref 26.0–34.0)
MCHC: 34 g/dL (ref 30.0–36.0)
MCV: 92 fL (ref 78.0–100.0)
Monocytes Absolute: 0.5 10*3/uL (ref 0.1–1.0)
Monocytes Relative: 8 % (ref 3–12)
Neutro Abs: 3.8 10*3/uL (ref 1.7–7.7)
Neutrophils Relative %: 60 % (ref 43–77)
Platelets: 318 10*3/uL (ref 150–400)
RBC: 4.25 MIL/uL (ref 3.87–5.11)
RDW: 14.3 % (ref 11.5–15.5)
WBC: 6.3 10*3/uL (ref 4.0–10.5)

## 2015-06-06 LAB — POC URINE PREG, ED: PREG TEST UR: NEGATIVE

## 2015-06-06 LAB — LIPASE, BLOOD: Lipase: 20 U/L — ABNORMAL LOW (ref 22–51)

## 2015-06-06 MED ORDER — AMOXICILLIN 500 MG PO CAPS: 500.0000 mg | ORAL_CAPSULE | Freq: Three times a day (TID) | ORAL | Status: DC

## 2015-06-06 MED ORDER — MORPHINE SULFATE 4 MG/ML IJ SOLN
4.0000 mg | Freq: Once | INTRAMUSCULAR | Status: AC
  Administered 2015-06-06: 4 mg via INTRAVENOUS
  Filled 2015-06-06: qty 1

## 2015-06-06 MED ORDER — SODIUM CHLORIDE 0.9 % IV BOLUS (SEPSIS)
1000.0000 mL | Freq: Once | INTRAVENOUS | Status: AC
Start: 2015-06-06 — End: 2015-06-06
  Administered 2015-06-06: 1000 mL via INTRAVENOUS

## 2015-06-06 MED ORDER — AMOXICILLIN 500 MG PO CAPS
500.0000 mg | ORAL_CAPSULE | Freq: Once | ORAL | Status: AC
  Administered 2015-06-06: 500 mg via ORAL
  Filled 2015-06-06: qty 1

## 2015-06-06 MED ORDER — NEOMYCIN-POLYMYXIN-HC 3.5-10000-1 OT SUSP: 3.0000 [drp] | Freq: Three times a day (TID) | OTIC | Status: DC

## 2015-06-06 NOTE — Discharge Instructions (Signed)
Otitis Externa Take medications as prescribed. Follow-up with your primary care doctor in 3 days. Otitis externa is a bacterial or fungal infection of the outer ear canal. This is the area from the eardrum to the outside of the ear. Otitis externa is sometimes called "swimmer's ear." CAUSES  Possible causes of infection include:  Swimming in dirty water.  Moisture remaining in the ear after swimming or bathing.  Mild injury (trauma) to the ear.  Objects stuck in the ear (foreign body).  Cuts or scrapes (abrasions) on the outside of the ear. SIGNS AND SYMPTOMS  The first symptom of infection is often itching in the ear canal. Later signs and symptoms may include swelling and redness of the ear canal, ear pain, and yellowish-white fluid (pus) coming from the ear. The ear pain may be worse when pulling on the earlobe. DIAGNOSIS  Your health care provider will perform a physical exam. A sample of fluid may be taken from the ear and examined for bacteria or fungi. TREATMENT  Antibiotic ear drops are often given for 10 to 14 days. Treatment may also include pain medicine or corticosteroids to reduce itching and swelling. HOME CARE INSTRUCTIONS   Apply antibiotic ear drops to the ear canal as prescribed by your health care provider.  Take medicines only as directed by your health care provider.  If you have diabetes, follow any additional treatment instructions from your health care provider.  Keep all follow-up visits as directed by your health care provider. PREVENTION   Keep your ear dry. Use the corner of a towel to absorb water out of the ear canal after swimming or bathing.  Avoid scratching or putting objects inside your ear. This can damage the ear canal or remove the protective wax that lines the canal. This makes it easier for bacteria and fungi to grow.  Avoid swimming in lakes, polluted water, or poorly chlorinated pools.  You may use ear drops made of rubbing alcohol and  vinegar after swimming. Combine equal parts of white vinegar and alcohol in a bottle. Put 3 or 4 drops into each ear after swimming. SEEK MEDICAL CARE IF:   You have a fever.  Your ear is still red, swollen, painful, or draining pus after 3 days.  Your redness, swelling, or pain gets worse.  You have a severe headache.  You have redness, swelling, pain, or tenderness in the area behind your ear. MAKE SURE YOU:   Understand these instructions.  Will watch your condition.  Will get help right away if you are not doing well or get worse. Document Released: 11/21/2005 Document Revised: 04/07/2014 Document Reviewed: 12/08/2011 [facility] Patient Information 2015 [place] [place]. This information is not intended to replace advice given to you by your health care provider. Make sure you discuss any questions you have with your health care provider.  Otitis Media Otitis media is redness, soreness, and inflammation of the middle ear. Otitis media may be caused by allergies or, most commonly, by infection. Often it occurs as a complication of the common cold. SIGNS AND SYMPTOMS Symptoms of otitis media may include:  Earache.  Fever.  Ringing in your ear.  Headache.  Leakage of fluid from the ear. DIAGNOSIS To diagnose otitis media, your health care provider will examine your ear with an otoscope. This is an instrument that allows your health care provider to see into your ear in order to examine your eardrum. Your health care provider also will ask you questions about your symptoms. TREATMENT  Typically, otitis media resolves on its own within 3-5 days. Your health care provider may prescribe medicine to ease your symptoms of pain. If otitis media does not resolve within 5 days or is recurrent, your health care provider may prescribe antibiotic medicines if he or she suspects that a bacterial infection is the cause. HOME CARE INSTRUCTIONS   If you were prescribed an antibiotic medicine,  finish it all even if you start to feel better.  Take medicines only as directed by your health care provider.  Keep all follow-up visits as directed by your health care provider. SEEK MEDICAL CARE IF:  You have otitis media only in one ear, or bleeding from your nose, or both.  You notice a lump on your neck.  You are not getting better in 3-5 days.  You feel worse instead of better. SEEK IMMEDIATE MEDICAL CARE IF:   You have pain that is not controlled with medicine.  You have swelling, redness, or pain around your ear or stiffness in your neck.  You notice that part of your face is paralyzed.  You notice that the bone behind your ear (mastoid) is tender when you touch it. MAKE SURE YOU:   Understand these instructions.  Will watch your condition.  Will get help right away if you are not doing well or get worse. Document Released: 08/26/2004 Document Revised: 04/07/2014 Document Reviewed: 06/18/2013 [facility] Patient Information 2015 [place], [place]. This information is not intended to replace advice given to you by your health care provider. Make sure you discuss any questions you have with your health care provider.

## 2015-06-06 NOTE — ED Notes (Signed)
Pt reports diarrhea, RUQ pain and ear pain x2 weeks. Pt sts that she had hernia sx where she is having abd pain and started to hurt after she coughed. Pt wearing abd binder with no relief. Pt is A&O and in NAD

## 2015-06-06 NOTE — ED Provider Notes (Signed)
CSN: [id number]     Arrival date & time 06/06/15  1014 History   First MD Initiated Contact with Patient 06/06/15 1025     Chief Complaint  Patient presents with  . Diarrhea  . Otalgia     (Consider location/radiation/quality/duration/timing/severity/associated sxs/prior Treatment) Patient is a 34 y.o. female presenting with diarrhea and ear pain. The history is provided by the patient. No language interpreter was used.  Diarrhea Associated symptoms: abdominal pain   Associated symptoms: no chills, no fever and no vomiting   Otalgia Associated symptoms: abdominal pain and diarrhea   Associated symptoms: no fever and no vomiting   Ms. Cua is a 34 year old female with a history of hypertension, umbilical hernia repair who presents for 3 days of right ear pain and 10 days of right upper quadrant abdominal pain. She states the abdominal pain comes in waves and is worse after eating. She is also complaining of several episodes of diarrhea daily for the past week. She states she used some of her old ear drops from a previous infection 2 days ago. She states when she put them and they burned but she used them several times after that also. She denies any fever, chills or chest pain, shortness of breath, cough,, nausea, vomiting, constipation, hematochezia. Past Medical History  Diagnosis Date  . Ectopic pregnancy   . Urinary tract infection   . Ovarian cyst   . Infection     UTI  . Back pain, chronic   . Dysrhythmia     hx of a-fib  . Hypertension     was told, but never on meds  . Heart murmur   . Depression     hx of   . GERD (gastroesophageal reflux disease)   . Headache     occasional    Past Surgical History  Procedure Laterality Date  . Etopical surgery    . Cleft palate repair    . Laparoscopy for ectopic pregnancy  2011  . Ventral hernia repair N/A 03/26/2015    Procedure: LAPAROSCOPIC REPAIR OF INCARCERATED SUPERUMBILICAL HERNIA X2 AND UMBILICAL HERNIA REPAIR WITH  MESH;  Surgeon: Karie Soda, MD;  Location: [facility];  Service: General;  Laterality: N/A;   Family History  Problem Relation Age of Onset  . Other Neg Hx   . Hearing loss Neg Hx   . Hypertension Mother   . Hypertension Father   . Cancer Sister   . Heart disease Maternal Grandmother   . Cancer Maternal Grandfather    History  Substance Use Topics  . Smoking status: Former Smoker -- 0.25 packs/day for 15 years    Types: Cigarettes    Quit date: 10/02/2014  . Smokeless tobacco: Never Used  . Alcohol Use: No     Comment: hx of occasiional use    OB History    Gravida Para Term Preterm AB TAB SAB Ectopic Multiple Living   2    2 0 1 1 0 0     Review of Systems  Constitutional: Negative for fever and chills.  HENT: Positive for ear pain.   Gastrointestinal: Positive for abdominal pain and diarrhea. Negative for nausea and vomiting.  Genitourinary: Negative for dysuria and hematuria.  All other systems reviewed and are negative.     Allergies  Flexeril  Home Medications   Prior to Admission medications   Medication Sig Start Date End Date Taking? Authorizing Provider  ibuprofen (ADVIL,MOTRIN) 600 MG tablet Take 1 tablet (600 mg total) by mouth  every 6 (six) hours as needed for headache or moderate pain. 03/26/15  Yes Karie SodaSteven Gross, MD  methocarbamol (ROBAXIN) 750 MG tablet Take 1 tablet (750 mg total) by mouth every 6 (six) hours as needed for muscle spasms. 03/26/15  Yes Karie SodaSteven Gross, MD  amoxicillin (AMOXIL) 500 MG capsule Take 1 capsule (500 mg total) by mouth 3 (three) times daily. 06/06/15   Arcelia Pals Patel-Mills, PA-C  divalproex (DEPAKOTE ER) 500 MG 24 hr tablet Take 2 tablets (1,000 mg total) by mouth at bedtime. For mood stabilization. Patient not taking: Reported on 12/07/2014 08/22/13   Tamala JulianNeil T Mashburn, PA-C  neomycin-polymyxin-hydrocortisone (CORTISPORIN) 3.5-10000-1 otic suspension Place 3 drops into the right ear 3 (three) times daily. 06/06/15   Benny Deutschman Patel-Mills, PA-C   traMADol (ULTRAM) 50 MG tablet Take 1-2 tablets (50-100 mg total) by mouth every 6 (six) hours as needed for moderate pain or severe pain. Patient not taking: Reported on 06/06/2015 04/01/15   Karie SodaSteven Gross, MD   BP 148/88 mmHg  Pulse 68  Temp(Src) 97.8 F (36.6 C) (Oral)  Resp 16  SpO2 100%  LMP 05/04/2015 (Approximate) Physical Exam  Constitutional: She is oriented to person, place, and time. She appears well-developed and well-nourished.  HENT:  Head: Normocephalic and atraumatic.  Right Ear: There is tenderness. No drainage or swelling. No foreign bodies. Tympanic membrane is retracted. Tympanic membrane is not injected, not perforated and not erythematous. No middle ear effusion. No hemotympanum.  Left Ear: Tympanic membrane, external ear and ear canal normal.  Tenderness of the tragus on the right. Erythema of the right ear canal.  Eyes: Conjunctivae are normal.  Neck: Normal range of motion. Neck supple.  Cardiovascular: Normal rate, regular rhythm and normal heart sounds.   Pulmonary/Chest: Effort normal and breath sounds normal.  Abdominal: Soft. Normal appearance. She exhibits no distension and no mass. There is tenderness in the right upper quadrant. There is positive Murphy's sign. There is no rigidity, no rebound, no guarding and no tenderness at McBurney's point.  Musculoskeletal: Normal range of motion.  Neurological: She is alert and oriented to person, place, and time.  Skin: Skin is warm and dry.  Psychiatric: She has a normal mood and affect. Her behavior is normal.  Nursing note and vitals reviewed.   ED Course  Procedures (including critical care time) Labs Review Labs Reviewed  LIPASE, BLOOD - Abnormal; Notable for the following:    Lipase 20 (*)    All other components within normal limits  CBC WITH DIFFERENTIAL/PLATELET  COMPREHENSIVE METABOLIC PANEL  URINALYSIS, ROUTINE W REFLEX MICROSCOPIC ([facility])  POC URINE PREG, ED    Imaging Review [place]  Abdomen Limited Ruq  06/06/2015   CLINICAL DATA:  Right upper abdominal pain x2 days  EXAM: US ABDOMEN LIMITED - RIGHT UPPER QUADRANT  COMPARISON:  None.  FINDINGS: Gallbladder:  No gallstones or wall thickening visualized. No sonographic Murphy sign noted. No pericholecystic fluid.  Common bile duct:  Diameter: 2 mm diameter, unremarkable  Liver:  No focal lesion identified. Within normal limits in parenchymal echogenicity.  IMPRESSION: 1. Negative.  Normal gallbladder.   Electronically Signed   By: Corlis Leak  Hassell M.D.   On: 06/06/2015 11:33     EKG Interpretation None      MDM   Final diagnoses:  RUQ pain  Otitis externa of right ear  Patient's vitals are stable and she is afebrile. Patient's labs are unremarkable including a UA, liver enzymes and lipase. She is not pregnant. Ultrasound  of the gallbladder is negative for gallstones or inflammation. The common bile duct diameter is normal. She has a right otitis external and possible otitis media. The canal is erythematous and inflamed. The TM is retracted. I put the patient on amoxicillin and gave her Cortisporin anabiotic eardrops. She can take Motrin or Tylenol for the right sided rib pain. I discussed the results with the patient and she can follow-up with her PCP and agrees with the plan.     Catha Gosselin, PA-C 06/06/15 1250  Doug Sou, MD 06/06/15 1553

## 2015-06-17 ENCOUNTER — Ambulatory Visit (INDEPENDENT_AMBULATORY_CARE_PROVIDER_SITE_OTHER): Payer: 59 | Admitting: Emergency Medicine

## 2015-06-17 VITALS — BP 114/80 | HR 71 | Temp 97.9°F | Resp 16 | Ht 61.0 in | Wt 124.4 lb

## 2015-06-17 DIAGNOSIS — G4700 Insomnia, unspecified: Secondary | ICD-10-CM

## 2015-06-17 DIAGNOSIS — H9191 Unspecified hearing loss, right ear: Secondary | ICD-10-CM | POA: Diagnosis not present

## 2015-06-17 DIAGNOSIS — H7291 Unspecified perforation of tympanic membrane, right ear: Secondary | ICD-10-CM

## 2015-06-17 DIAGNOSIS — R1010 Upper abdominal pain, unspecified: Secondary | ICD-10-CM

## 2015-06-17 DIAGNOSIS — G8929 Other chronic pain: Secondary | ICD-10-CM

## 2015-06-17 DIAGNOSIS — R1011 Right upper quadrant pain: Principal | ICD-10-CM

## 2015-06-17 MED ORDER — MELOXICAM 15 MG PO TABS: 15.0000 mg | ORAL_TABLET | Freq: Every day | ORAL | Status: DC

## 2015-06-17 NOTE — Patient Instructions (Addendum)
Use some over the counter hydrocortisone cream in your ear twice a day for 10 days Take meloxicam every day for 5-7 days for upper abdominal muscle pain and then take daily as needed We will call you about an appointment with the Ear, Nose and Throat specialist Can try melatonin- I recommend taking every day for 2 weeks to see if it will help Follow up in 6 months for complete physical exam Insomnia Insomnia is frequent trouble falling and/or staying asleep. Insomnia can be a long term problem or a short term problem. Both are common. Insomnia can be a short term problem when the wakefulness is related to a certain stress or worry. Long term insomnia is often related to ongoing stress during waking hours and/or poor sleeping habits. Overtime, sleep deprivation itself can make the problem worse. Every little thing feels more severe because you are overtired and your ability to cope is decreased. CAUSES   Stress, anxiety, and depression.  Poor sleeping habits.  Distractions such as TV in the bedroom.  Naps close to bedtime.  Engaging in emotionally charged conversations before bed.  Technical reading before sleep.  Alcohol and other sedatives. They may make the problem worse. They can hurt normal sleep patterns and normal dream activity.  Stimulants such as caffeine for several hours prior to bedtime.  Pain syndromes and shortness of breath can cause insomnia.  Exercise late at night.  Changing time zones may cause sleeping problems (jet lag). It is sometimes helpful to have someone observe your sleeping patterns. They should look for periods of not breathing during the night (sleep apnea). They should also look to see how long those periods last. If you live alone or observers are uncertain, you can also be observed at a sleep clinic where your sleep patterns will be professionally monitored. Sleep apnea requires a checkup and treatment. Give your caregivers your medical history. Give  your caregivers observations your family has made about your sleep.  SYMPTOMS   Not feeling rested in the morning.  Anxiety and restlessness at bedtime.  Difficulty falling and staying asleep. TREATMENT   Your caregiver may prescribe treatment for an underlying medical disorders. Your caregiver can give advice or help if you are using alcohol or other drugs for self-medication. Treatment of underlying problems will usually eliminate insomnia problems.  Medications can be prescribed for short time use. They are generally not recommended for lengthy use.  Over-the-counter sleep medicines are not recommended for lengthy use. They can be habit forming.  You can promote easier sleeping by making lifestyle changes such as:  Using relaxation techniques that help with breathing and reduce muscle tension.  Exercising earlier in the day.  Changing your diet and the time of your last meal. No night time snacks.  Establish a regular time to go to bed.  Counseling can help with stressful problems and worry.  Soothing music and white noise may be helpful if there are background noises you cannot remove.  Stop tedious detailed work at least one hour before bedtime. HOME CARE INSTRUCTIONS   Keep a diary. Inform your caregiver about your progress. This includes any medication side effects. See your caregiver regularly. Take note of:  Times when you are asleep.  Times when you are awake during the night.  The quality of your sleep.  How you feel the next day. This information will help your caregiver care for you.  Get out of bed if you are still awake after 15 minutes. Read or  do some quiet activity. Keep the lights down. Wait until you feel sleepy and go back to bed.  Keep regular sleeping and waking hours. Avoid naps.  Exercise regularly.  Avoid distractions at bedtime. Distractions include watching television or engaging in any intense or detailed activity like attempting to  balance the household checkbook.  Develop a bedtime ritual. Keep a familiar routine of bathing, brushing your teeth, climbing into bed at the same time each night, listening to soothing music. Routines increase the success of falling to sleep faster.  Use relaxation techniques. This can be using breathing and muscle tension release routines. It can also include visualizing peaceful scenes. You can also help control troubling or intruding thoughts by keeping your mind occupied with boring or repetitive thoughts like the old concept of counting sheep. You can make it more creative like imagining planting one beautiful flower after another in your backyard garden.  During your day, work to eliminate stress. When this is not possible use some of the previous suggestions to help reduce the anxiety that accompanies stressful situations. MAKE SURE YOU:   Understand these instructions.  Will watch your condition.  Will get help right away if you are not doing well or get worse. Document Released: 11/18/2000 Document Revised: 02/13/2012 Document Reviewed: 12/19/2007 [facility] Patient Information 2015 [place]. This information is not intended to replace advice given to you by your health care provider. Make sure you discuss any questions you have with your health care provider.

## 2015-06-17 NOTE — Progress Notes (Signed)
Subjective:    Patient ID: [name], female    DOB: 07-20-1981, 34 y.o.   MRN: [id number]  HPI This is a very pleasant 34 yo female who was seen 06/06/15 in the ED and was diagnosed with right otitis externa and was given ear drops and amoxicillin. The patient has not been able to tolerate the drops because they burn when she uses them. She has one dose of amoxicillin remaining. The patient reports her ear pain has resolved, but she has continued itching. She no longer has drainage, but is bothered by itch all night long. She has a long standing history of right ear problems since she was about 4. She does not hear as well in her right ear.   Patient had hernia repair 4/16 and continues to have muscle spasm and pain on her right upper abdomen. She wears her binder at work where she does a lot of moderate lifting/pushing/pulling. She notices pain after she finishes work. She works 3-4 days a week. She is in school for medical billing/coding and hopes to change careers when she graduates in April 2016.  She initially got relief with methocarbamol. Did not get relief with Tramadol. Got some relief with narcotic pain relievers.   She was previously on medication for unspecified bipolar disorder. She states that she "was in bad shape," following the death of her father 4 years ago. She reports that she is doing well now, but is somewhat anxious and stressed with everything she has going on. She lives with her mother who offers good support. She has chronic insomnia, having difficulty going to sleep at night.   She was previously diagnosed with HTN and was briefly on medication. She has not had any elevated blood pressure recently.  She has long standing amenorrhea and has an appointment with gyn next month.   Review of Systems As per HPI.     Objective:   Physical Exam  Constitutional: She is oriented to person, place, and time. She appears well-developed and well-nourished.  HENT:  Head:  Normocephalic and atraumatic.  Right Ear: External ear normal. Tympanic membrane is perforated.  Left Ear: Tympanic membrane, external ear and ear canal normal.  Nose: Nose normal.  Mouth/Throat: Oropharynx is clear and moist.  Right ear with anterior half of TM with folded over appearance. Looks chronic. No drainage. Canal erythematous. Decreased gross hearing on right side.  Neck: Normal range of motion. Neck supple.  Cardiovascular: Normal rate, regular rhythm and normal heart sounds.   Pulmonary/Chest: Effort normal and breath sounds normal.  Abdominal: Soft. Bowel sounds are normal.  Right upper quadrant with area of firmness, tender. Some generalized tenderness with palpation.   Musculoskeletal: Normal range of motion.  Neurological: She is alert and oriented to person, place, and time.  Skin: Skin is warm and dry.  Psychiatric: She has a normal mood and affect. Her behavior is normal. Judgment and thought content normal.  Vitals reviewed.  BP 114/80 mmHg  Pulse 71  Temp(Src) 97.9 F (36.6 C) (Oral)  Resp 16  Ht 5\' 1"  (1.549 m)  Wt 124 lb 6.4 oz (56.427 kg)  BMI 23.52 kg/m2  SpO2 90%  LMP 05/05/2015 Wt Readings from Last 3 Encounters:  06/17/15 124 lb 6.4 oz (56.427 kg)  03/31/15 132 lb 15 oz (60.3 kg)  03/26/15 128 lb (58.06 kg)      Assessment & Plan:  Discussed with Dr. Cleta Albertsaub who also examined patient's ears.  1. Abdominal pain, chronic,  right upper quadrant - discussed recent surgery and need for continued healing- encouraged her to wear binder as much as possible - meloxicam (MOBIC) 15 MG tablet; Take 1 tablet (15 mg total) by mouth daily.  Dispense: 30 tablet; Refill: 0  2. Perforation of ear drum, right - Ambulatory referral to ENT  3. Decreased hearing of right ear - Ambulatory referral to ENT  4. Right ear canal irritation - she will use OTC cortisone cream (she has some at home) twice a day for 7-10 days - she is to avoid cotton swabs  5. Insomnia -  provided written and verbal information regarding insomnia; encouraged good sleep habits and suggested melatonin  - recommended follow up at appointment center in 6 months to check stress/sleeping/labs.  Olean Ree, FNP-BC  [facility], [facility]  06/17/2015 10:35 AM

## 2015-07-28 ENCOUNTER — Encounter (HOSPITAL_BASED_OUTPATIENT_CLINIC_OR_DEPARTMENT_OTHER): Payer: Self-pay | Admitting: *Deleted

## 2015-07-28 ENCOUNTER — Encounter (HOSPITAL_BASED_OUTPATIENT_CLINIC_OR_DEPARTMENT_OTHER)
Admission: RE | Admit: 2015-07-28 | Discharge: 2015-07-28 | Disposition: A | Discharge status: Home / Self Care | Payer: 59 | Source: Ambulatory Visit | Attending: Otolaryngology | Admitting: Otolaryngology

## 2015-07-28 DIAGNOSIS — H7211 Attic perforation of tympanic membrane, right ear: Secondary | ICD-10-CM | POA: Insufficient documentation

## 2015-07-28 DIAGNOSIS — Z01818 Encounter for other preprocedural examination: Secondary | ICD-10-CM | POA: Insufficient documentation

## 2015-07-28 DIAGNOSIS — H7201 Central perforation of tympanic membrane, right ear: Secondary | ICD-10-CM | POA: Diagnosis not present

## 2015-07-28 DIAGNOSIS — H902 Conductive hearing loss, unspecified: Secondary | ICD-10-CM | POA: Diagnosis not present

## 2015-07-28 DIAGNOSIS — F17210 Nicotine dependence, cigarettes, uncomplicated: Secondary | ICD-10-CM | POA: Diagnosis not present

## 2015-07-28 DIAGNOSIS — I10 Essential (primary) hypertension: Secondary | ICD-10-CM | POA: Diagnosis not present

## 2015-07-28 LAB — PREGNANCY, URINE: Preg Test, Ur: NEGATIVE

## 2015-07-28 NOTE — Progress Notes (Signed)
Chart reviewed by Dr Ivin Booty concerning hx of A-fib. He ordered an EKG to be done, showing NSR. Pt also reports no periods since March and only spotted then. States she has hx of irreg periods also reports no sexual activity in over a year. Urine preg done per Dr Ivin Booty order.

## 2015-07-30 ENCOUNTER — Other Ambulatory Visit: Payer: Self-pay | Admitting: Otolaryngology

## 2015-07-30 NOTE — H&P (Signed)
[name], [name] 34 y.o., female [id number]     Chief Complaint: RIght tympanic perforation  HPI: 35 year old black female is sent over for evaluation of a hole in the RIGHT eardrum.  She is not sure how long this has been there.  She is aware that for the last several years, if she gets water in her right ear, she will get an infection with pain and drainage.  Not obvious that she gets an infection with an upper respiratory infection.  No issues on the LEFT side.  She did have a cleft palate repaired in her infancy.  She does not feel like she hears well in the right ear ever.  No medical or neurologic risk factors.  She does listen to loud music frequently.  No prior surgery on either ear.  2 week return visit for preoperative evaluation.  I discussed the proposed procedure, namely RIGHT medial technique tympanoplasty.  I will definitely use  temporalis fascia, and may use a postauricular approach as well.  I discussed this with her including risks and complications.  Questions were answered and an informed consent was obtained.  I left her prescriptions today for Keflex, hydrocodone, and Ciprodex eardrops for afterwards.  I will see her back 10 days postop.  I discussed advancement of diet and activity.  She is not presently working.  PMH: Past Medical History  Diagnosis Date  . Ectopic pregnancy   . Urinary tract infection   . Ovarian cyst   . Infection     UTI  . Back pain, chronic   . Depression     hx of   . Headache     occasional   . Dysrhythmia 2013    hx of a-fib  . Hypertension     was told, but never on meds  . Anxiety     Surg Hx: Past Surgical History  Procedure Laterality Date  . Etopical surgery    . Cleft palate repair    . Laparoscopy for ectopic pregnancy  2011  . Ventral hernia repair N/A 03/26/2015    Procedure: LAPAROSCOPIC REPAIR OF INCARCERATED SUPERUMBILICAL HERNIA X2 AND UMBILICAL HERNIA REPAIR WITH MESH;  Surgeon: Karie Soda, MD;  Location: [facility]  [facility];  Service: General;  Laterality: N/A;    FHx:   Family History  Problem Relation Age of Onset  . Other Neg Hx   . Hearing loss Neg Hx   . Hypertension Mother   . Hypertension Father   . Cancer Sister   . Heart disease Maternal Grandmother   . Cancer Maternal Grandfather    SocHx:  reports that she has been smoking Cigarettes.  She has a 3.75 pack-year smoking history. She has never used smokeless tobacco. She reports that she drinks about 0.6 oz of alcohol per week. She reports that she does not use illicit drugs.  ALLERGIES:  Allergies  Allergen Reactions  . Flexeril [Cyclobenzaprine] Hives     (Not in a hospital admission)  No results found for this or any previous visit (from the past 48 hour(s)). No results found.  JYN:WGNFAOZH: Not feeling tired (fatigue).  No fever, no night sweats, and no recent weight loss. Head: Headache. Eyes: No eye symptoms. Otolaryngeal: No hearing loss.  Earache.  No tinnitus  and no purulent nasal discharge.  No nasal passage blockage (stuffiness), no snoring, no sneezing, no hoarseness, and no sore throat. Cardiovascular: No chest pain or discomfort  and no palpitations. Pulmonary: No dyspnea, no cough, and no wheezing. Gastrointestinal:  No dysphagia  and no heartburn.  No nausea, no abdominal pain, and no melena.  No diarrhea. Genitourinary: No dysuria. Endocrine: No muscle weakness. Musculoskeletal: No calf muscle cramps, no arthralgias, and no soft tissue swelling. Neurological: No dizziness, no fainting, no tingling, and no numbness. Psychological: Anxiety.  No depression. Skin: No rash.  Last menstrual period 02/03/2015.   BP:130/102,  HR: 79 b/min,  BSA Calculated: 1.57 ,  BMI Calculated: 24.56 ,  Weight: 130 lb , BMI: 24.6 kg/m2,  Height: 5 ft 1 in.  PHYSICAL EXAM: She is trim and healthy.  Mental status is intact.  She hears adequately in conversational speech.  Voice is clear and respirations unlabored through the nose.   The head is atraumatic and neck supple.  Cranial nerves intact.  Both ear canals are clear.  The LEFT drum is intact with some dimeric and scarred areas.  The RIGHT drum has a 40% anterior central perforation.  No active drainage.  Middle ear mucosa appears healthy.  Anterior nose is moist and patent.  Oral cavity is clear with teeth in good repair.  Oropharynx shows changes consistent with cleft palate required including a pharyngeal flap closure.  Neck unremarkable.  Lungs: Clear to auscultation Heart: Regular rate and rhythm without murmurs Abdomen: Soft, active Extremities: Normal configuration Neurologic: Symmetric, grossly intact  Studies Reviewed:  Pure-tone audiometry shows normal hearing on the LEFT, dropping substantially above 2000 Hz.  Discrimination is 96%.  The RIGHT side had similar sensorineural thresholds with a 15-20 dB conductive gap.  discrimination is again 96%.   Tympanometry is normal on the LEFT, open on the RIGHT.    Assessment/Plan Perforated tympanic membrane, right (384.20) (H72.91). Conductive hearing loss (389.00) (H90.2). 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  We are going to repair your RIGHT eardrum next week.  you will not hear out of that ear for 6-8 weeks.  I will see you back here 10 days after surgery.  No nose blowing for 2 Weeks  please.   I am leaving you prescriptions today for antibiotics by mouth, eardrops, and also pain medication.  you will not need to use the drops until after I see you the first time.  Ciprodex 0.3-0.1 % Otic Suspension;3 drops AD bid x 7 days; Qty7.5; R2; Rx. Cephalexin 500 MG Oral Capsule;TAKE 1 CAPSULE 4 TIMES DAILY; Qty40; R0; Rx. Hydrocodone-Acetaminophen 5-325 MG Oral Tablet;1-2 po q4-6h prn pain; Qty30; R0; Rx.  Flo Shanks 07/30/2015, 6:14 PM

## 2015-08-03 ENCOUNTER — Encounter (HOSPITAL_BASED_OUTPATIENT_CLINIC_OR_DEPARTMENT_OTHER)
Admission: RE | Disposition: A | Discharge status: Home / Self Care | Payer: Self-pay | Source: Ambulatory Visit | Attending: Otolaryngology

## 2015-08-03 ENCOUNTER — Encounter (HOSPITAL_BASED_OUTPATIENT_CLINIC_OR_DEPARTMENT_OTHER): Payer: Self-pay | Admitting: *Deleted

## 2015-08-03 ENCOUNTER — Ambulatory Visit (HOSPITAL_BASED_OUTPATIENT_CLINIC_OR_DEPARTMENT_OTHER)
Admission: RE | Admit: 2015-08-03 | Discharge: 2015-08-03 | Disposition: A | Discharge status: Home / Self Care | Payer: 59 | Source: Ambulatory Visit | Attending: Otolaryngology | Admitting: Otolaryngology

## 2015-08-03 ENCOUNTER — Ambulatory Visit (HOSPITAL_BASED_OUTPATIENT_CLINIC_OR_DEPARTMENT_OTHER): Payer: 59 | Admitting: Anesthesiology

## 2015-08-03 DIAGNOSIS — I10 Essential (primary) hypertension: Secondary | ICD-10-CM | POA: Insufficient documentation

## 2015-08-03 DIAGNOSIS — H902 Conductive hearing loss, unspecified: Secondary | ICD-10-CM | POA: Insufficient documentation

## 2015-08-03 DIAGNOSIS — F17210 Nicotine dependence, cigarettes, uncomplicated: Secondary | ICD-10-CM | POA: Insufficient documentation

## 2015-08-03 DIAGNOSIS — H7201 Central perforation of tympanic membrane, right ear: Secondary | ICD-10-CM | POA: Diagnosis not present

## 2015-08-03 HISTORY — PX: TYMPANOPLASTY: SHX33

## 2015-08-03 HISTORY — DX: Anxiety disorder, unspecified: F41.9

## 2015-08-03 LAB — POCT HEMOGLOBIN-HEMACUE: Hemoglobin: 14.9 g/dL (ref 12.0–15.0)

## 2015-08-03 SURGERY — TYMPANOPLASTY
Anesthesia: General | Site: Ear | Laterality: Right

## 2015-08-03 MED ORDER — OXYCODONE HCL 5 MG PO TABS
5.0000 mg | ORAL_TABLET | Freq: Once | ORAL | Status: AC | PRN
  Administered 2015-08-03: 5 mg via ORAL

## 2015-08-03 MED ORDER — LIDOCAINE-EPINEPHRINE 1 %-1:100000 IJ SOLN
INTRAMUSCULAR | Status: DC | PRN
  Administered 2015-08-03: 10 mL

## 2015-08-03 MED ORDER — SUFENTANIL CITRATE 50 MCG/ML IV SOLN
INTRAVENOUS | Status: AC
  Filled 2015-08-03: qty 1

## 2015-08-03 MED ORDER — MIDAZOLAM HCL 2 MG/2ML IJ SOLN
1.0000 mg | INTRAMUSCULAR | Status: DC | PRN
  Administered 2015-08-03: 2 mg via INTRAVENOUS

## 2015-08-03 MED ORDER — HYDROMORPHONE HCL 1 MG/ML IJ SOLN
INTRAMUSCULAR | Status: AC
  Filled 2015-08-03: qty 1

## 2015-08-03 MED ORDER — MEPERIDINE HCL 25 MG/ML IJ SOLN: 6.2500 mg | INTRAMUSCULAR | Status: DC | PRN

## 2015-08-03 MED ORDER — HYDROMORPHONE HCL 1 MG/ML IJ SOLN
0.2500 mg | INTRAMUSCULAR | Status: DC | PRN
  Administered 2015-08-03 (×2): 0.5 mg via INTRAVENOUS

## 2015-08-03 MED ORDER — DEXTROSE-NACL 5-0.45 % IV SOLN: INTRAVENOUS | Status: DC

## 2015-08-03 MED ORDER — EPINEPHRINE HCL 1 MG/ML IJ SOLN
INTRAMUSCULAR | Status: AC
  Filled 2015-08-03: qty 1

## 2015-08-03 MED ORDER — MIDAZOLAM HCL 2 MG/2ML IJ SOLN
INTRAMUSCULAR | Status: AC
  Filled 2015-08-03: qty 2

## 2015-08-03 MED ORDER — LIDOCAINE HCL (CARDIAC) 20 MG/ML IV SOLN
INTRAVENOUS | Status: DC | PRN
  Administered 2015-08-03: 75 mg via INTRAVENOUS

## 2015-08-03 MED ORDER — PROPOFOL 10 MG/ML IV BOLUS
INTRAVENOUS | Status: DC | PRN
  Administered 2015-08-03: 200 mg via INTRAVENOUS

## 2015-08-03 MED ORDER — HYDROCODONE-ACETAMINOPHEN 5-325 MG PO TABS: 1.0000 | ORAL_TABLET | ORAL | Status: DC | PRN

## 2015-08-03 MED ORDER — ONDANSETRON HCL 4 MG PO TABS: 4.0000 mg | ORAL_TABLET | ORAL | Status: DC | PRN

## 2015-08-03 MED ORDER — LACTATED RINGERS IV SOLN
INTRAVENOUS | Status: DC
  Administered 2015-08-03 (×2): via INTRAVENOUS

## 2015-08-03 MED ORDER — CIPROFLOXACIN-DEXAMETHASONE 0.3-0.1 % OT SUSP
OTIC | Status: AC
  Filled 2015-08-03: qty 7.5

## 2015-08-03 MED ORDER — BACITRACIN ZINC 500 UNIT/GM EX OINT
TOPICAL_OINTMENT | CUTANEOUS | Status: AC
  Filled 2015-08-03: qty 28.35

## 2015-08-03 MED ORDER — SUFENTANIL CITRATE 50 MCG/ML IV SOLN
INTRAVENOUS | Status: DC | PRN
  Administered 2015-08-03: 5 ug via INTRAVENOUS
  Administered 2015-08-03: 10 ug via INTRAVENOUS
  Administered 2015-08-03: 5 ug via INTRAVENOUS

## 2015-08-03 MED ORDER — OXYCODONE HCL 5 MG/5ML PO SOLN: 5.0000 mg | Freq: Once | ORAL | Status: AC | PRN

## 2015-08-03 MED ORDER — CEFAZOLIN SODIUM-DEXTROSE 2-3 GM-% IV SOLR
2.0000 g | INTRAVENOUS | Status: AC
  Administered 2015-08-03: 2 g via INTRAVENOUS

## 2015-08-03 MED ORDER — CIPROFLOXACIN-DEXAMETHASONE 0.3-0.1 % OT SUSP
OTIC | Status: DC | PRN
  Administered 2015-08-03: 4 [drp] via OTIC

## 2015-08-03 MED ORDER — ONDANSETRON HCL 4 MG/2ML IJ SOLN
INTRAMUSCULAR | Status: DC | PRN
  Administered 2015-08-03: 4 mg via INTRAVENOUS

## 2015-08-03 MED ORDER — GLYCOPYRROLATE 0.2 MG/ML IJ SOLN: 0.2000 mg | Freq: Once | INTRAMUSCULAR | Status: DC | PRN

## 2015-08-03 MED ORDER — DEXAMETHASONE SODIUM PHOSPHATE 4 MG/ML IJ SOLN
INTRAMUSCULAR | Status: DC | PRN
  Administered 2015-08-03: 10 mg via INTRAVENOUS

## 2015-08-03 MED ORDER — CEFAZOLIN SODIUM-DEXTROSE 2-3 GM-% IV SOLR
INTRAVENOUS | Status: AC
  Filled 2015-08-03: qty 50

## 2015-08-03 MED ORDER — IBUPROFEN 100 MG/5ML PO SUSP: 400.0000 mg | Freq: Four times a day (QID) | ORAL | Status: DC | PRN

## 2015-08-03 MED ORDER — SUCCINYLCHOLINE CHLORIDE 20 MG/ML IJ SOLN
INTRAMUSCULAR | Status: DC | PRN
  Administered 2015-08-03: 120 mg via INTRAVENOUS

## 2015-08-03 MED ORDER — OXYCODONE HCL 5 MG PO TABS
ORAL_TABLET | ORAL | Status: AC
  Filled 2015-08-03: qty 1

## 2015-08-03 MED ORDER — ONDANSETRON HCL 4 MG/2ML IJ SOLN: 4.0000 mg | INTRAMUSCULAR | Status: DC | PRN

## 2015-08-03 MED ORDER — FENTANYL CITRATE (PF) 100 MCG/2ML IJ SOLN: 50.0000 ug | INTRAMUSCULAR | Status: DC | PRN

## 2015-08-03 MED ORDER — SCOPOLAMINE 1 MG/3DAYS TD PT72: 1.0000 | MEDICATED_PATCH | Freq: Once | TRANSDERMAL | Status: DC | PRN

## 2015-08-03 SURGICAL SUPPLY — 71 items
ADH SKN CLS APL DERMABOND .7 (GAUZE/BANDAGES/DRESSINGS) ×1
APL SKNCLS STERI-STRIP NONHPOA (GAUZE/BANDAGES/DRESSINGS)
BAG URINE DRAINAGE (UROLOGICAL SUPPLIES) IMPLANT
BALL CTTN LRG ABS STRL LF (GAUZE/BANDAGES/DRESSINGS) ×1
BENZOIN TINCTURE PRP APPL 2/3 (GAUZE/BANDAGES/DRESSINGS) IMPLANT
BLADE CLIPPER SURG (BLADE) IMPLANT
BLADE SURG 15 STRL LF DISP TIS (BLADE) ×1 IMPLANT
BLADE SURG 15 STRL SS (BLADE) ×2
BNDG CONFORM 2 STRL LF (GAUZE/BANDAGES/DRESSINGS) IMPLANT
BNDG CONFORM 3 STRL LF (GAUZE/BANDAGES/DRESSINGS) IMPLANT
BNDG GAUZE ELAST 4 BULKY (GAUZE/BANDAGES/DRESSINGS) IMPLANT
CANISTER SUCT 1200ML W/VALVE (MISCELLANEOUS) ×2 IMPLANT
CATH FOLEY 2WAY SLVR  5CC 14FR (CATHETERS)
CATH FOLEY 2WAY SLVR 5CC 14FR (CATHETERS) IMPLANT
COTTONBALL LRG STERILE PKG (GAUZE/BANDAGES/DRESSINGS) ×2 IMPLANT
DECANTER SPIKE VIAL GLASS SM (MISCELLANEOUS) ×2 IMPLANT
DEPRESSOR TONGUE BLADE STERILE (MISCELLANEOUS) ×2 IMPLANT
DERMABOND ADVANCED (GAUZE/BANDAGES/DRESSINGS) ×1
DERMABOND ADVANCED .7 DNX12 (GAUZE/BANDAGES/DRESSINGS) IMPLANT
DRAPE CONTRAVES MICR (DRAPES) ×1 IMPLANT
DROPPER MEDICINE STER 1.5ML LF (MISCELLANEOUS) IMPLANT
DRSG GLASSCOCK MASTOID ADT (GAUZE/BANDAGES/DRESSINGS) ×1 IMPLANT
DRSG GLASSCOCK MASTOID PED (GAUZE/BANDAGES/DRESSINGS) IMPLANT
ELECT COATED BLADE 2.86 ST (ELECTRODE) ×2 IMPLANT
ELECT REM PT RETURN 9FT ADLT (ELECTROSURGICAL) ×2
ELECTRODE REM PT RTRN 9FT ADLT (ELECTROSURGICAL) ×1 IMPLANT
GAUZE SPONGE 4X4 12PLY STRL (GAUZE/BANDAGES/DRESSINGS) IMPLANT
GAUZE SPONGE 4X4 16PLY XRAY LF (GAUZE/BANDAGES/DRESSINGS) IMPLANT
GLOVE BIOGEL PI IND STRL 7.0 (GLOVE) IMPLANT
GLOVE BIOGEL PI INDICATOR 7.0 (GLOVE) ×1
GLOVE ECLIPSE 6.5 STRL STRAW (GLOVE) ×1 IMPLANT
GLOVE ECLIPSE 8.0 STRL XLNG CF (GLOVE) ×2 IMPLANT
GOWN STRL REUS W/ TWL LRG LVL3 (GOWN DISPOSABLE) ×1 IMPLANT
GOWN STRL REUS W/ TWL XL LVL3 (GOWN DISPOSABLE) ×1 IMPLANT
GOWN STRL REUS W/TWL LRG LVL3 (GOWN DISPOSABLE) ×2
GOWN STRL REUS W/TWL XL LVL3 (GOWN DISPOSABLE) ×2
IV NS 500ML (IV SOLUTION)
IV NS 500ML BAXH (IV SOLUTION) IMPLANT
IV SET EXT 30 76VOL 4 MALE LL (IV SETS) ×1 IMPLANT
LIQUID BAND (GAUZE/BANDAGES/DRESSINGS) IMPLANT
NDL HYPO 25X1 1.5 SAFETY (NEEDLE) ×1 IMPLANT
NDL PRECISIONGLIDE 27X1.5 (NEEDLE) ×1 IMPLANT
NDL SAFETY ECLIPSE 18X1.5 (NEEDLE) ×1 IMPLANT
NEEDLE HYPO 18GX1.5 SHARP (NEEDLE) ×2
NEEDLE HYPO 25X1 1.5 SAFETY (NEEDLE) ×2 IMPLANT
NEEDLE PRECISIONGLIDE 27X1.5 (NEEDLE) ×2 IMPLANT
NS IRRIG 1000ML POUR BTL (IV SOLUTION) ×2 IMPLANT
PACK BASIN DAY SURGERY FS (CUSTOM PROCEDURE TRAY) ×2 IMPLANT
PACK ENT DAY SURGERY (CUSTOM PROCEDURE TRAY) ×2 IMPLANT
PENCIL BUTTON HOLSTER BLD 10FT (ELECTRODE) ×2 IMPLANT
SLEEVE SCD COMPRESS KNEE MED (MISCELLANEOUS) ×1 IMPLANT
SPONGE GAUZE 4X4 12PLY STER LF (GAUZE/BANDAGES/DRESSINGS) IMPLANT
SPONGE SURGIFOAM ABS GEL 12-7 (HEMOSTASIS) ×1 IMPLANT
STAPLER VISISTAT 35W (STAPLE) IMPLANT
STRIP CLOSURE SKIN 1/2X4 (GAUZE/BANDAGES/DRESSINGS) IMPLANT
SUT CHROMIC 3 0 PS 2 (SUTURE) ×1 IMPLANT
SUT CHROMIC 4 0 P 3 18 (SUTURE) IMPLANT
SUT CHROMIC 5 0 P 3 (SUTURE) IMPLANT
SUT CHROMIC 6 0 G 1 (SUTURE) IMPLANT
SUT CHROMIC 6 0 PS 4 (SUTURE) IMPLANT
SUT ETHILON 6 0 P 1 (SUTURE) IMPLANT
SYR 5ML LL (SYRINGE) IMPLANT
SYR BULB 3OZ (MISCELLANEOUS) ×1 IMPLANT
SYR TB 1ML LL NO SAFETY (SYRINGE) ×2 IMPLANT
TOWEL OR 17X24 6PK STRL BLUE (TOWEL DISPOSABLE) ×2 IMPLANT
TRAY DSU PREP LF (CUSTOM PROCEDURE TRAY) ×2 IMPLANT
TRAY FOLEY CATH SILVER 16FR (SET/KITS/TRAYS/PACK) IMPLANT
TUBE CONNECTING 20X1/4 (TUBING) ×1 IMPLANT
TUBE EAR T MOD 1.32X4.8 BL (OTOLOGIC RELATED) IMPLANT
TUBE EAR VENT DONALDSON 1.14 (OTOLOGIC RELATED) IMPLANT
TUBING IRRIGATION (MISCELLANEOUS) IMPLANT

## 2015-08-03 NOTE — Anesthesia Postprocedure Evaluation (Signed)
  Anesthesia Post-op Note  Patient: [name]  Procedure(s) Performed: Procedure(s): RIGHT MEDIAL TECHNIQUE TYMPANOPLASTY (Right)  Patient Location: PACU  Anesthesia Type: General   Level of Consciousness: awake, alert  and oriented  Airway and Oxygen Therapy: Patient Spontanous Breathing  Post-op Pain: none  Post-op Assessment: Post-op Vital signs reviewed  Post-op Vital Signs: Reviewed  Last Vitals:  Filed Vitals:   08/03/15 1015  BP: 124/81  Pulse: 94  Temp:   Resp: 22    Complications: No apparent anesthesia complications

## 2015-08-03 NOTE — Transfer of Care (Signed)
Immediate Anesthesia Transfer of Care Note  Patient: [name]  Procedure(s) Performed: Procedure(s): RIGHT MEDIAL TECHNIQUE TYMPANOPLASTY (Right)  Patient Location: PACU  Anesthesia Type:General  Level of Consciousness: awake, alert  and oriented  Airway & Oxygen Therapy: Patient Spontanous Breathing and Patient connected to face mask oxygen  Post-op Assessment: Report given to RN and Post -op Vital signs reviewed and stable  Post vital signs: Reviewed and stable  Last Vitals:  Filed Vitals:   08/03/15 0611  BP: 140/92  Pulse: 67  Temp: 36.7 C  Resp: 18    Complications: No apparent anesthesia complications

## 2015-08-03 NOTE — Anesthesia Procedure Notes (Signed)
Procedure Name: Intubation Date/Time: 08/03/2015 8:16 AM Performed by: Zenia Resides D Pre-anesthesia Checklist: Patient identified, Emergency Drugs available, Suction available and Patient being monitored Patient Re-evaluated:Patient Re-evaluated prior to inductionOxygen Delivery Method: Circle System Utilized Preoxygenation: Pre-oxygenation with 100% oxygen Intubation Type: IV induction Ventilation: Mask ventilation without difficulty Laryngoscope Size: Mac and 3 Grade View: Grade I Tube type: Oral Number of attempts: 1 Airway Equipment and Method: Stylet and Oral airway Placement Confirmation: ETT inserted through vocal cords under direct vision,  positive ETCO2 and breath sounds checked- equal and bilateral Secured at: 23 cm Tube secured with: Tape Dental Injury: Teeth and Oropharynx as per pre-operative assessment

## 2015-08-03 NOTE — H&P (View-Only) (Signed)
Breanna, Graves 34 y.o., female [id number]     Chief Complaint: RIght tympanic perforation  HPI: 35 year old black female is sent over for evaluation of a hole in the RIGHT eardrum.  She is not sure how long this has been there.  She is aware that for the last several years, if she gets water in her right ear, she will get an infection with pain and drainage.  Not obvious that she gets an infection with an upper respiratory infection.  No issues on the LEFT side.  She did have a cleft palate repaired in her infancy.  She does not feel like she hears well in the right ear ever.  No medical or neurologic risk factors.  She does listen to loud music frequently.  No prior surgery on either ear.  2 week return visit for preoperative evaluation.  I discussed the proposed procedure, namely RIGHT medial technique tympanoplasty.  I will definitely use  temporalis fascia, and may use a postauricular approach as well.  I discussed this with her including risks and complications.  Questions were answered and an informed consent was obtained.  I left her prescriptions today for Keflex, hydrocodone, and Ciprodex eardrops for afterwards.  I will see her back 10 days postop.  I discussed advancement of diet and activity.  She is not presently working.  PMH: Past Medical History  Diagnosis Date  . Ectopic pregnancy   . Urinary tract infection   . Ovarian cyst   . Infection     UTI  . Back pain, chronic   . Depression     hx of   . Headache     occasional   . Dysrhythmia 2013    hx of a-fib  . Hypertension     was told, but never on meds  . Anxiety     Surg Hx: Past Surgical History  Procedure Laterality Date  . Etopical surgery    . Cleft palate repair    . Laparoscopy for ectopic pregnancy  2011  . Ventral hernia repair N/A 03/26/2015    Procedure: LAPAROSCOPIC REPAIR OF INCARCERATED SUPERUMBILICAL HERNIA X2 AND UMBILICAL HERNIA REPAIR WITH MESH;  Surgeon: Karie Soda, MD;  Location: [facility];  Service: General;  Laterality: N/A;    FHx:   Family History  Problem Relation Age of Onset  . Other Neg Hx   . Hearing loss Neg Hx   . Hypertension Mother   . Hypertension Father   . Cancer Sister   . Heart disease Maternal Grandmother   . Cancer Maternal Grandfather    SocHx:  reports that she has been smoking Cigarettes.  She has a 3.75 pack-year smoking history. She has never used smokeless tobacco. She reports that she drinks about 0.6 oz of alcohol per week. She reports that she does not use illicit drugs.  ALLERGIES:  Allergies  Allergen Reactions  . Flexeril [Cyclobenzaprine] Hives     (Not in a hospital admission)  No results found for this or any previous visit (from the past 48 hour(s)). No results found.  JYN:WGNFAOZH: Not feeling tired (fatigue).  No fever, no night sweats, and no recent weight loss. Head: Headache. Eyes: No eye symptoms. Otolaryngeal: No hearing loss.  Earache.  No tinnitus  and no purulent nasal discharge.  No nasal passage blockage (stuffiness), no snoring, no sneezing, no hoarseness, and no sore throat. Cardiovascular: No chest pain or discomfort  and no palpitations. Pulmonary: No dyspnea, no cough, and no wheezing. Gastrointestinal:  No dysphagia  and no heartburn.  No nausea, no abdominal pain, and no melena.  No diarrhea. Genitourinary: No dysuria. Endocrine: No muscle weakness. Musculoskeletal: No calf muscle cramps, no arthralgias, and no soft tissue swelling. Neurological: No dizziness, no fainting, no tingling, and no numbness. Psychological: Anxiety.  No depression. Skin: No rash.  Last menstrual period 02/03/2015.   BP:130/102,  HR: 79 b/min,  BSA Calculated: 1.57 ,  BMI Calculated: 24.56 ,  Weight: 130 lb , BMI: 24.6 kg/m2,  Height: 5 ft 1 in.  PHYSICAL EXAM: She is trim and healthy.  Mental status is intact.  She hears adequately in conversational speech.  Voice is clear and respirations unlabored through the nose.   The head is atraumatic and neck supple.  Cranial nerves intact.  Both ear canals are clear.  The LEFT drum is intact with some dimeric and scarred areas.  The RIGHT drum has a 40% anterior central perforation.  No active drainage.  Middle ear mucosa appears healthy.  Anterior nose is moist and patent.  Oral cavity is clear with teeth in good repair.  Oropharynx shows changes consistent with cleft palate required including a pharyngeal flap closure.  Neck unremarkable.  Lungs: Clear to auscultation Heart: Regular rate and rhythm without murmurs Abdomen: Soft, active Extremities: Normal configuration Neurologic: Symmetric, grossly intact  Studies Reviewed:  Pure-tone audiometry shows normal hearing on the LEFT, dropping substantially above 2000 Hz.  Discrimination is 96%.  The RIGHT side had similar sensorineural thresholds with a 15-20 dB conductive gap.  discrimination is again 96%.   Tympanometry is normal on the LEFT, open on the RIGHT.    Assessment/Plan Perforated tympanic membrane, right (384.20) (H72.91). Conductive hearing loss (389.00) (H90.2). 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  We are going to repair your RIGHT eardrum next week.  you will not hear out of that ear for 6-8 weeks.  I will see you back here 10 days after surgery.  No nose blowing for 2 Weeks  please.   I am leaving you prescriptions today for antibiotics by mouth, eardrops, and also pain medication.  you will not need to use the drops until after I see you the first time.  Ciprodex 0.3-0.1 % Otic Suspension;3 drops AD bid x 7 days; Qty7.5; R2; Rx. Cephalexin 500 MG Oral Capsule;TAKE 1 CAPSULE 4 TIMES DAILY; Qty40; R0; Rx. Hydrocodone-Acetaminophen 5-325 MG Oral Tablet;1-2 po q4-6h prn pain; Qty30; R0; Rx.  Flo Shanks 07/30/2015, 6:14 PM

## 2015-08-03 NOTE — Op Note (Signed)
08/03/2015  9:50 AM    Bennetts, Merlyn Albert  [id number]   Pre-Op Dx:  Right central tympanic perforation  Post-op Dx: same  Proc: right medial technique tympanoplasty with temporalis fascia graft   Surg:  Flo Shanks T MD  Anes:  GOT  EBL:  minimal  Comp:  none  Findings:  40-50 percent anterior central perforation up to the annulus anteriorly and back to the long process of the malleus. Clean healthy middle ear mucosa.  The malleus handle was free of the promontory.  Procedure: with the patient in a comfortable supine position, general orotracheal anesthesia was induced without difficulty. At an appropriate level, the head was turned to the left for access to the right ear. The identifying initials were noted. A surgical timeout was completed in the standard fashion. 1% Xylocaine with 1 100,000 epinephrine, 8 mL's total was infiltrated into the postauricular sulcus into the vascular strip and into the area of the temporalis fascia graft for intraoperative hemostasis. Several minutes were allowed for this to take effect. A sterile preparation and draping of the right ear was accomplished in standard fashion.  Sterile microscope was brought into the field and the ear canal was examined and cleaned. The findings were as described above. Four-quadrant injections of 1% Xylocaine with 1 100,000 epinephrine, 2 mL total was performed at the standard fashion.  Returning to the postauricular area, incision was sharply executed and then continued with the cautery down to the mastoid periosteum and down to the temporalis fascia. The soft tissue lateral to the fascia was cleared away. An incision was made in the inferior fascia and the medial surface was also cleaned. A 1.5 x 2 cm piece of fascia was sharply harvested pressed and placed to dry. Hemostasis was spontaneous.  Returning to the ear canal, a tympanomeatal flap was executed but the drum was not elevated at this point. The inferior  portion of the perforation was carefully cleaned using a tab knife and a Rosen needle.  Returning to the postauricular sulcus, the ear was retained forward and the fascia was elevated down the ear canal using a Industrial/product designer. The tympanomeatal incisions were identified. A Perkins retractor was placed to hold the canal forward. At this point the perforation was more fully visible. The superior portion of the perforation was again de-epithelialized using a tab knife and a Rosen needle. It was not felt possible to place the fascia through the perforation.  Thin tympanomeatal flap was laid forward and the annulus was elevated and the middle ear was entered and the posterior inferior quadrant. The dissection was carried up to the chordae tendineae which was not disturbed. It was carried down to the 6:00 position. The tympanomeatal flap and drum was laid forward.  The temporalis fascia was fashioned into a tongue and placed in the middle ear underneath the malleus long-handle and up into the protympanum. Visualization down the canal revealed good positioning. Gelfoam impregnated with Ciprodex drops was placed up into the anterior superior and anterior inferior quadrants to support the graft. Excellent position and support was noted . Additional Gelfoam was placed in the posterior quadrants to support the graft. The tympanomeatal flap was laid down and carefully smoothed along the posterior canal.  additional Ciprodex impregnated Gelfoam was placed in the lateral surface of the drum and graft to support this.  The Perkins retractor was removed. Hemostasis was observed. The ear was laid back down and reapproximated with 3-0 chromic in the subcutaneous layer.  Returning to the  ear canal, additional Gelfoam was placed and finally a cotton ball was lodged into the external meatus. A second cotton ball was placed into the conchal bowl.  The postauricular incision was sealed with Dermabond. Additional supporting  gauze was placed and a Glasscock dressing was applied in the standard fashion.  At this point the procedure was completed. The patient was returned to anesthesia, awakened, extubated, and transferred to recovery in stable condition.  Dispo:   PACU to home  Plan:  Ice, elevation, analgesia. Remove the dressing in 2 days. No nose blowing or water exposure. Return visit my office 10 days.  Cephus Richer MD

## 2015-08-03 NOTE — Anesthesia Preprocedure Evaluation (Signed)
Anesthesia Evaluation  Patient identified by MRN, date of birth, ID band Patient awake    Reviewed: Allergy & Precautions, NPO status , Patient's Chart, lab work & pertinent test results  Airway Mallampati: I  TM Distance: >3 FB Neck ROM: Full    Dental  (+) Teeth Intact, Dental Advisory Given   Pulmonary Current Smoker,  breath sounds clear to auscultation        Cardiovascular hypertension, Rhythm:Regular Rate:Normal     Neuro/Psych    GI/Hepatic   Endo/Other    Renal/GU      Musculoskeletal   Abdominal   Peds  Hematology   Anesthesia Other Findings   Reproductive/Obstetrics                             Anesthesia Physical Anesthesia Plan  ASA: I  Anesthesia Plan: General   Post-op Pain Management:    Induction: Intravenous  Airway Management Planned: Oral ETT  Additional Equipment:   Intra-op Plan:   Post-operative Plan: Extubation in OR  Informed Consent: I have reviewed the patients History and Physical, chart, labs and discussed the procedure including the risks, benefits and alternatives for the proposed anesthesia with the patient or authorized representative who has indicated his/her understanding and acceptance.   Dental advisory given  Plan Discussed with: CRNA, Anesthesiologist and Surgeon  Anesthesia Plan Comments:         Anesthesia Quick Evaluation

## 2015-08-03 NOTE — Interval H&P Note (Signed)
History and Physical Interval Note:  08/03/2015 8:07 AM  [name]  has presented today for surgery, with the diagnosis of right central tempanic perforation  The various methods of treatment have been discussed with the patient and family. After consideration of risks, benefits and other options for treatment, the patient has consented to  Procedure(s): RIGHT MEDIAL TECHNIQUE TYMPANOPLASTY (Right) as a surgical intervention .  The patient's history has been re-reviewed, patient re-examined, no change in status, stable for surgery.  I have re-reviewed the patient's chart and labs.  Questions were answered to the patient's satisfaction.     Flo Shanks

## 2015-08-03 NOTE — Discharge Instructions (Signed)
OK to remove head dressing on Wednesday, 31 AUG Recheck my office 10 days, call [phone] for an appointment OK to change outer cotton ball as often as desired. No nose blowing x 2 weeks Keep RIGHT ear dry. No strenuous activity x 7 days Call for active bleeding, signs of infection, [phone], Dr. Lazarus Salines    Post Anesthesia Home Care Instructions  Activity: Get plenty of rest for the remainder of the day. A responsible adult should stay with you for 24 hours following the procedure.  For the next 24 hours, DO NOT: -Drive a car -Advertising copywriter -Drink alcoholic beverages -Take any medication unless instructed by your physician -Make any legal decisions or sign important papers.  Meals: Start with liquid foods such as gelatin or soup. Progress to regular foods as tolerated. Avoid greasy, spicy, heavy foods. If nausea and/or vomiting occur, drink only clear liquids until the nausea and/or vomiting subsides. Call your physician if vomiting continues.  Special Instructions/Symptoms: Your throat may feel dry or sore from the anesthesia or the breathing tube placed in your throat during surgery. If this causes discomfort, gargle with warm salt water. The discomfort should disappear within 24 hours.  If you had a scopolamine patch placed behind your ear for the management of post- operative nausea and/or vomiting:  1. The medication in the patch is effective for 72 hours, after which it should be removed.  Wrap patch in a tissue and discard in the trash. Wash hands thoroughly with soap and water. 2. You may remove the patch earlier than 72 hours if you experience unpleasant side effects which may include dry mouth, dizziness or visual disturbances. 3. Avoid touching the patch. Wash your hands with soap and water after contact with the patch.

## 2015-08-03 NOTE — Addendum Note (Signed)
Addendum  created 08/03/15 1032 by Sheldon Silvan, MD   Modules edited: Orders, PRL Based Order Sets

## 2015-08-04 ENCOUNTER — Encounter (HOSPITAL_BASED_OUTPATIENT_CLINIC_OR_DEPARTMENT_OTHER): Payer: Self-pay | Admitting: Otolaryngology

## 2015-09-07 ENCOUNTER — Encounter: Payer: Self-pay | Admitting: Family Medicine

## 2015-09-07 ENCOUNTER — Ambulatory Visit (INDEPENDENT_AMBULATORY_CARE_PROVIDER_SITE_OTHER): Payer: 59 | Admitting: Family Medicine

## 2015-09-07 VITALS — BP 134/89 | HR 89 | Temp 98.5°F | Resp 16 | Wt 128.0 lb

## 2015-09-07 DIAGNOSIS — R030 Elevated blood-pressure reading, without diagnosis of hypertension: Secondary | ICD-10-CM

## 2015-09-07 DIAGNOSIS — G4700 Insomnia, unspecified: Secondary | ICD-10-CM

## 2015-09-07 DIAGNOSIS — F411 Generalized anxiety disorder: Secondary | ICD-10-CM

## 2015-09-07 DIAGNOSIS — R197 Diarrhea, unspecified: Secondary | ICD-10-CM

## 2015-09-07 DIAGNOSIS — Z23 Encounter for immunization: Secondary | ICD-10-CM

## 2015-09-07 DIAGNOSIS — IMO0001 Reserved for inherently not codable concepts without codable children: Secondary | ICD-10-CM

## 2015-09-07 MED ORDER — CLONAZEPAM 0.5 MG PO TABS: 0.5000 mg | ORAL_TABLET | Freq: Two times a day (BID) | ORAL | Status: DC | PRN

## 2015-09-07 NOTE — Progress Notes (Signed)
Subjective:    Patient ID: [name], female    DOB: Jun 19, 1981, 34 y.o.   MRN: [id number]  HPI This is a pleasant 34 yo female who presents today for follow up of HTN. She had surgery on her ear a couple of months ago and was advised to follow up elevated blood pressure readings. Prior to that, she was seen here 06/17/15 and blood pressure was 114/80.   She reports that she has terrible anxiety and insomnia. She is in school full time and is on track to finish 5/17. She recently resigned from her job and is very anxious regarding getting another job. She recently had a telephone interview which she felt like she bombed because she became flustered and was unable to concentrate. She has only been sleeping a couple of hours a night. She was diagnosed with bipolar disorder several years ago and was on Depakote. She had difficulty with the side effects. She denies any depressive symptoms currently. She feels like she has "alot on me," with family pressures, school and not currently working.   Has had diarrhea for about a week. No blood or mucous. Some urgency, no incontinence. Feels like she is having bloating, gurgling. Occasional nausea, no vomiting. No worsening with food.   Not currently sexually active or in a relationship.  Review of Systems Per HPI    Objective:   Physical Exam Physical Exam  Constitutional: Oriented to person, place, and time. She appears well-developed and well-nourished.  HENT:  Head: Normocephalic and atraumatic.  Eyes: Conjunctivae are normal.  Neck: Normal range of motion. Neck supple.  Cardiovascular: Normal rate, regular rhythm and normal heart sounds.   Pulmonary/Chest: Effort normal and breath sounds normal.  Musculoskeletal: Normal range of motion.  Neurological: Alert and oriented to person, place, and time.  Skin: Skin is warm and dry.  Psychiatric: Normal mood and affect. Behavior is normal. Judgment and thought content normal.  Vitals  reviewed. BP 134/89 mmHg  Pulse 89  Temp(Src) 98.5 F (36.9 C) (Oral)  Resp 16  Wt 128 lb (58.06 kg) Wt Readings from Last 3 Encounters:  09/07/15 128 lb (58.06 kg)  08/03/15 123 lb (55.792 kg)  06/17/15 124 lb 6.4 oz (56.427 kg)   Depression screen [facility] 2/9 09/07/2015 06/17/2015 09/01/2014  Decreased Interest 0 0 0  Down, Depressed, Hopeless 0 0 0  PHQ - 2 Score 0 0 0      Assessment & Plan:  1. Need for prophylactic vaccination and inoculation against influenza - Flu Vaccine QUAD 36+ mos IM  2. Generalized anxiety disorder - clonazePAM (KLONOPIN) 0.5 MG tablet; Take 1 tablet (0.5 mg total) by mouth 2 (two) times daily as needed for anxiety.  Dispense: 30 tablet; Refill: 1  3. Insomnia - I am concerned that her anxiety and insomnia may be a manifestation of bipolar disorder. I have given her names of local resources for evaluation of her mood, she was agreeable to making an appointment. - She can use a clonazepam at bedtime and we discussed importance of getting more sleep and good sleep hygiene; making lists and prioritizing tasks.  4. Diarrhea, unspecified type - sounds like this is related to her anxiety, but if not improved in 1 week, will check for c.diff.  5. Elevated BP - her blood pressure is normal today and was normal at last visit. Will continue to monitor.   - follow up in 1 month, sooner if worsening symptoms.  Olean Ree, FNP-BC  [facility], [facility]  09/07/2015 12:29 PM

## 2015-09-07 NOTE — Patient Instructions (Addendum)
Try 1/2 to 1 tablet of immodium daily for loose stools.   For evaluation of mood disorder- [facility]- [phone] [facility]- [phone] [phone] [facility]- [phone]

## 2015-09-25 ENCOUNTER — Emergency Department (HOSPITAL_COMMUNITY)
Admission: EM | Admit: 2015-09-25 | Discharge: 2015-09-25 | Disposition: A | Discharge status: Home / Self Care | Payer: 59 | Attending: Emergency Medicine | Admitting: Emergency Medicine

## 2015-09-25 ENCOUNTER — Encounter (HOSPITAL_COMMUNITY): Payer: Self-pay | Admitting: *Deleted

## 2015-09-25 DIAGNOSIS — J069 Acute upper respiratory infection, unspecified: Secondary | ICD-10-CM | POA: Diagnosis not present

## 2015-09-25 DIAGNOSIS — F419 Anxiety disorder, unspecified: Secondary | ICD-10-CM | POA: Insufficient documentation

## 2015-09-25 DIAGNOSIS — G8929 Other chronic pain: Secondary | ICD-10-CM | POA: Insufficient documentation

## 2015-09-25 DIAGNOSIS — Z8543 Personal history of malignant neoplasm of ovary: Secondary | ICD-10-CM | POA: Diagnosis not present

## 2015-09-25 DIAGNOSIS — Z8742 Personal history of other diseases of the female genital tract: Secondary | ICD-10-CM | POA: Insufficient documentation

## 2015-09-25 DIAGNOSIS — J0110 Acute frontal sinusitis, unspecified: Secondary | ICD-10-CM | POA: Diagnosis not present

## 2015-09-25 DIAGNOSIS — I10 Essential (primary) hypertension: Secondary | ICD-10-CM | POA: Insufficient documentation

## 2015-09-25 DIAGNOSIS — H9209 Otalgia, unspecified ear: Secondary | ICD-10-CM | POA: Diagnosis not present

## 2015-09-25 DIAGNOSIS — Z79899 Other long term (current) drug therapy: Secondary | ICD-10-CM | POA: Diagnosis not present

## 2015-09-25 DIAGNOSIS — J0100 Acute maxillary sinusitis, unspecified: Secondary | ICD-10-CM | POA: Diagnosis not present

## 2015-09-25 DIAGNOSIS — Z87440 Personal history of urinary (tract) infections: Secondary | ICD-10-CM | POA: Diagnosis not present

## 2015-09-25 DIAGNOSIS — R51 Headache: Secondary | ICD-10-CM | POA: Diagnosis present

## 2015-09-25 MED ORDER — NAPROXEN 500 MG PO TABS
500.0000 mg | ORAL_TABLET | Freq: Once | ORAL | Status: AC
  Administered 2015-09-25: 500 mg via ORAL
  Filled 2015-09-25: qty 1

## 2015-09-25 MED ORDER — AMOXICILLIN-POT CLAVULANATE 875-125 MG PO TABS: 1.0000 | ORAL_TABLET | Freq: Two times a day (BID) | ORAL | Status: DC

## 2015-09-25 MED ORDER — NAPROXEN 500 MG PO TABS: 500.0000 mg | ORAL_TABLET | Freq: Two times a day (BID) | ORAL | Status: DC

## 2015-09-25 MED ORDER — FLUTICASONE PROPIONATE 50 MCG/ACT NA SUSP: 2.0000 | Freq: Every day | NASAL | Status: DC

## 2015-09-25 NOTE — ED Notes (Signed)
Pt arrives to the ER via EMS for complaints of sinus congestion and pressure for 2 weeks and a headache that began last night; pt states that she has had cough and congestion for 2 weeks that has progressively gotten worse; pt states that she began having a headache last night; pt states that she has a lot of pressure to her face that was not relieved with over the counter meds

## 2015-09-25 NOTE — ED Provider Notes (Signed)
CSN: [id number]     Arrival date & time 09/25/15  0357 History   First MD Initiated Contact with Patient 09/25/15 [phone]     Chief Complaint  Patient presents with  . URI  . Headache     (Consider location/radiation/quality/duration/timing/severity/associated sxs/prior Treatment) HPI Comments: 34 year old female presenting via EMS complaining of worsening sinus congestion and facial pressure 2 weeks with associated headache that began last night when she tried to go to sleep. Headache was gradual onset and gradually worsening, located around her temples and sinuses, worse with leaning forward and laying down. Admits to associated nasal congestion, rhinorrhea, sore throat, nonproductive cough. Headache is throbbing, 10/10. Symptoms unrelieved by over-the-counter medications such as Mucinex and Chloraseptic drops. She tried taking one leftover Bactrim for Mnire's surgery she had last month for a ruptured tympanic membrane. Her ears are feeling full but no significant pain. Denies fever, neck pain, confusion, lightheadedness, dizziness, extremity weakness/numbness, vision change.  Patient is a 34 y.o. female presenting with URI and headaches. The history is provided by the patient.  URI Presenting symptoms: congestion, cough, ear pain (fullness), facial pain, rhinorrhea and sore throat   Severity:  Severe Onset quality:  Gradual Duration:  2 weeks Timing:  Constant Progression:  Worsening Chronicity:  New Relieved by:  Nothing Exacerbated by: "everything" Ineffective treatments:  Decongestant and OTC medications Associated symptoms: headaches   Headache Associated symptoms: congestion, cough, ear pain (fullness), facial pain, sinus pressure, sore throat and URI     Past Medical History  Diagnosis Date  . Ectopic pregnancy   . Urinary tract infection   . Ovarian cyst   . Infection     UTI  . Back pain, chronic   . Depression     hx of   . Headache     occasional   .  Dysrhythmia 2013    hx of a-fib  . Hypertension     was told, but never on meds  . Anxiety    Past Surgical History  Procedure Laterality Date  . Etopical surgery    . Cleft palate repair    . Laparoscopy for ectopic pregnancy  2011  . Ventral hernia repair N/A 03/26/2015    Procedure: LAPAROSCOPIC REPAIR OF INCARCERATED SUPERUMBILICAL HERNIA X2 AND UMBILICAL HERNIA REPAIR WITH MESH;  Surgeon: Karie SodaSteven Gross, MD;  Location: [facility];  Service: General;  Laterality: N/A;  . Tympanoplasty Right 08/03/2015    Procedure: RIGHT MEDIAL TECHNIQUE TYMPANOPLASTY;  Surgeon: Flo ShanksKarol Wolicki, MD;  Location: [facility];  Service: ENT;  Laterality: Right;   Family History  Problem Relation Age of Onset  . Other Neg Hx   . Hearing loss Neg Hx   . Hypertension Mother   . Hypertension Father   . Cancer Sister   . Heart disease Maternal Grandmother   . Cancer Maternal Grandfather    Social History  Substance Use Topics  . Smoking status: Current Every Day Smoker -- 0.25 packs/day for 15 years    Types: Cigarettes    Last Attempt to Quit: 10/02/2014  . Smokeless tobacco: Never Used  . Alcohol Use: 0.6 oz/week    1 Standard drinks or equivalent per week     Comment: social   OB History    Gravida Para Term Preterm AB TAB SAB Ectopic Multiple Living   2    2 0 1 1 0 0     Review of Systems  HENT: Positive for congestion, ear pain (fullness), rhinorrhea, sinus pressure  and sore throat.   Respiratory: Positive for cough.   Neurological: Positive for headaches.  All other systems reviewed and are negative.     Allergies  Flexeril  Home Medications   Prior to Admission medications   Medication Sig Start Date End Date Taking? Authorizing Provider  clonazePAM (KLONOPIN) 0.5 MG tablet Take 1 tablet (0.5 mg total) by mouth 2 (two) times daily as needed for anxiety. 09/07/15  Yes Emi Belfast, FNP  phenol (CHLORASEPTIC) 1.4 % LIQD Use as directed 1 spray in the mouth or throat  as needed for throat irritation / pain.   Yes Historical Provider, MD  sulfamethoxazole-trimethoprim (BACTRIM DS,SEPTRA DS) 800-160 MG tablet Take 1 tablet by mouth 2 (two) times daily. 08/28/15  Yes Historical Provider, MD  amoxicillin-clavulanate (AUGMENTIN) 875-125 MG tablet Take 1 tablet by mouth 2 (two) times daily. One po bid x 7 days 09/25/15   Kathrynn Speed, PA-C  fluticasone [facility]) 50 MCG/ACT nasal spray Place 2 sprays into both nostrils daily. 09/25/15   Kathrynn Speed, PA-C  naproxen (NAPROSYN) 500 MG tablet Take 1 tablet (500 mg total) by mouth 2 (two) times daily. 09/25/15   Kanon Colunga M Katrenia Alkins, PA-C   BP 153/99 mmHg  Pulse 90  Temp(Src) 98 F (36.7 C) (Oral)  Resp 18  SpO2 96% Physical Exam  Constitutional: She is oriented to person, place, and time. She appears well-developed and well-nourished. No distress.  HENT:  Head: Normocephalic and atraumatic.  Right Ear: Ear canal normal. Tympanic membrane is scarred.  Left Ear: Tympanic membrane and ear canal normal.  Nose: Mucosal edema present. Right sinus exhibits maxillary sinus tenderness and frontal sinus tenderness. Left sinus exhibits maxillary sinus tenderness and frontal sinus tenderness.  Mouth/Throat: Uvula is midline. Posterior oropharyngeal erythema present. No oropharyngeal exudate or posterior oropharyngeal edema.  Post nasal drip.  Eyes: Conjunctivae and EOM are normal. Pupils are equal, round, and reactive to light.  Neck: Normal range of motion. Neck supple.  Cardiovascular: Normal rate, regular rhythm and normal heart sounds.   Pulmonary/Chest: Effort normal and breath sounds normal. No respiratory distress.  Musculoskeletal: Normal range of motion. She exhibits no edema.  Neurological: She is alert and oriented to person, place, and time. She has normal strength. No cranial nerve deficit or sensory deficit. Coordination and gait normal. GCS eye subscore is 4. GCS verbal subscore is 5. GCS motor subscore is 6.  Speech  fluent, goal oriented. Moves extremities without ataxia.  Skin: Skin is warm and dry.  Psychiatric: She has a normal mood and affect. Her behavior is normal.  Nursing note and vitals reviewed.   ED Course  Procedures (including critical care time) Labs Review Labs Reviewed - No data to display  Imaging Review No results found. I have personally reviewed and evaluated these images and lab results as part of my medical decision-making.   EKG Interpretation None      MDM   Final diagnoses:  Acute frontal sinusitis, recurrence not specified  Acute maxillary sinusitis, recurrence not specified  URI (upper respiratory infection)   Non-toxic appearing, NAD. AFVSS. Will treat with augmentin given 2 weeks of symptoms unrelieved by OTC treatment. No red flags concerning pt's HA. No focal neuro deficits. Symptoms related to sinusitis. Doubt intracranial bleed, meningitis. Advised the pt to not take "leftover" antibiotics. Advised flonase, NSAIDs, rest, humidifiers, nasal saline. F/u with PCP. Stable for d/c. Return precautions given. Pt/family/caregiver aware medical decision making process and agreeable with plan.  Nada Boozer  Daneesha Quinteros, PA-C 09/25/15 1610  Loren Racer, MD 09/26/15 [phone]

## 2015-09-25 NOTE — Discharge Instructions (Signed)
Take naproxen as prescribed. Take antibiotic to completion. Use nasal spray as directed.  Cool Mist Vaporizers Vaporizers may help relieve the symptoms of a cough and cold. They add moisture to the air, which helps mucus to become thinner and less sticky. This makes it easier to breathe and cough up secretions. Cool mist vaporizers do not cause serious burns like hot mist vaporizers, which may also be called steamers or humidifiers. Vaporizers have not been proven to help with colds. You should not use a vaporizer if you are allergic to mold. HOME CARE INSTRUCTIONS  Follow the package instructions for the vaporizer.  Do not use anything other than distilled water in the vaporizer.  Do not run the vaporizer all of the time. This can cause mold or bacteria to grow in the vaporizer.  Clean the vaporizer after each time it is used.  Clean and dry the vaporizer well before storing it.  Stop using the vaporizer if worsening respiratory symptoms develop.   This information is not intended to replace advice given to you by your health care provider. Make sure you discuss any questions you have with your health care provider.   Document Released: 08/18/2004 Document Revised: 11/26/2013 Document Reviewed: 04/10/2013 Elsevier Interactive Patient Education 2016 Elsevier Inc.  Sinusitis, Adult Sinusitis is redness, soreness, and inflammation of the paranasal sinuses. Paranasal sinuses are air pockets within the bones of your face. They are located beneath your eyes, in the middle of your forehead, and above your eyes. In healthy paranasal sinuses, mucus is able to drain out, and air is able to circulate through them by way of your nose. However, when your paranasal sinuses are inflamed, mucus and air can become trapped. This can allow bacteria and other germs to grow and cause infection. Sinusitis can develop quickly and last only a short time (acute) or continue over a long period (chronic). Sinusitis  that lasts for more than 12 weeks is considered chronic. CAUSES Causes of sinusitis include:  Allergies.  Structural abnormalities, such as displacement of the cartilage that separates your nostrils (deviated septum), which can decrease the air flow through your nose and sinuses and affect sinus drainage.  Functional abnormalities, such as when the small hairs (cilia) that line your sinuses and help remove mucus do not work properly or are not present. SIGNS AND SYMPTOMS Symptoms of acute and chronic sinusitis are the same. The primary symptoms are pain and pressure around the affected sinuses. Other symptoms include:  Upper toothache.  Earache.  Headache.  Bad breath.  Decreased sense of smell and taste.  A cough, which worsens when you are lying flat.  Fatigue.  Fever.  Thick drainage from your nose, which often is green and may contain pus (purulent).  Swelling and warmth over the affected sinuses. DIAGNOSIS Your health care provider will perform a physical exam. During your exam, your health care provider may perform any of the following to help determine if you have acute sinusitis or chronic sinusitis:  Look in your nose for signs of abnormal growths in your nostrils (nasal polyps).  Tap over the affected sinus to check for signs of infection.  View the inside of your sinuses using an imaging device that has a light attached (endoscope). If your health care provider suspects that you have chronic sinusitis, one or more of the following tests may be recommended:  Allergy tests.  Nasal culture. A sample of mucus is taken from your nose, sent to a lab, and screened for bacteria.  Nasal cytology. A sample of mucus is taken from your nose and examined by your health care provider to determine if your sinusitis is related to an allergy. TREATMENT Most cases of acute sinusitis are related to a viral infection and will resolve on their own within 10 days. Sometimes,  medicines are prescribed to help relieve symptoms of both acute and chronic sinusitis. These may include pain medicines, decongestants, nasal steroid sprays, or saline sprays. However, for sinusitis related to a bacterial infection, your health care provider will prescribe antibiotic medicines. These are medicines that will help kill the bacteria causing the infection. Rarely, sinusitis is caused by a fungal infection. In these cases, your health care provider will prescribe antifungal medicine. For some cases of chronic sinusitis, surgery is needed. Generally, these are cases in which sinusitis recurs more than 3 times per year, despite other treatments. HOME CARE INSTRUCTIONS  Drink plenty of water. Water helps thin the mucus so your sinuses can drain more easily.  Use a humidifier.  Inhale steam 3-4 times a day (for example, sit in the bathroom with the shower running).  Apply a warm, moist washcloth to your face 3-4 times a day, or as directed by your health care provider.  Use saline nasal sprays to help moisten and clean your sinuses.  Take medicines only as directed by your health care provider.  If you were prescribed either an antibiotic or antifungal medicine, finish it all even if you start to feel better. SEEK IMMEDIATE MEDICAL CARE IF:  You have increasing pain or severe headaches.  You have nausea, vomiting, or drowsiness.  You have swelling around your face.  You have vision problems.  You have a stiff neck.  You have difficulty breathing.   This information is not intended to replace advice given to you by your health care provider. Make sure you discuss any questions you have with your health care provider.   Document Released: 11/21/2005 Document Revised: 12/12/2014 Document Reviewed: 12/06/2011 Elsevier Interactive Patient Education 2016 Elsevier Inc.  Upper Respiratory Infection, Adult Most upper respiratory infections (URIs) are a viral infection of the  air passages leading to the lungs. A URI affects the nose, throat, and upper air passages. The most common type of URI is nasopharyngitis and is typically referred to as "the common cold." URIs run their course and usually go away on their own. Most of the time, a URI does not require medical attention, but sometimes a bacterial infection in the upper airways can follow a viral infection. This is called a secondary infection. Sinus and middle ear infections are common types of secondary upper respiratory infections. Bacterial pneumonia can also complicate a URI. A URI can worsen asthma and chronic obstructive pulmonary disease (COPD). Sometimes, these complications can require emergency medical care and may be life threatening.  CAUSES Almost all URIs are caused by viruses. A virus is a type of germ and can spread from one person to another.  RISKS FACTORS You may be at risk for a URI if:   You smoke.   You have chronic heart or lung disease.  You have a weakened defense (immune) system.   You are very young or very old.   You have nasal allergies or asthma.  You work in crowded or poorly ventilated areas.  You work in health care facilities or schools. SIGNS AND SYMPTOMS  Symptoms typically develop 2-3 days after you come in contact with a cold virus. Most viral URIs last 7-10 days. However,  viral URIs from the influenza virus (flu virus) can last 14-18 days and are typically more severe. Symptoms may include:   Runny or stuffy (congested) nose.   Sneezing.   Cough.   Sore throat.   Headache.   Fatigue.   Fever.   Loss of appetite.   Pain in your forehead, behind your eyes, and over your cheekbones (sinus pain).  Muscle aches.  DIAGNOSIS  Your health care provider may diagnose a URI by:  Physical exam.  Tests to check that your symptoms are not due to another condition such as:  Strep throat.  Sinusitis.  Pneumonia.  Asthma. TREATMENT  A URI goes  away on its own with time. It cannot be cured with medicines, but medicines may be prescribed or recommended to relieve symptoms. Medicines may help:  Reduce your fever.  Reduce your cough.  Relieve nasal congestion. HOME CARE INSTRUCTIONS   Take medicines only as directed by your health care provider.   Gargle warm saltwater or take cough drops to comfort your throat as directed by your health care provider.  Use a warm mist humidifier or inhale steam from a shower to increase air moisture. This may make it easier to breathe.  Drink enough fluid to keep your urine clear or pale yellow.   Eat soups and other clear broths and maintain good nutrition.   Rest as needed.   Return to work when your temperature has returned to normal or as your health care provider advises. You may need to stay home longer to avoid infecting others. You can also use a face mask and careful hand washing to prevent spread of the virus.  Increase the usage of your inhaler if you have asthma.   Do not use any tobacco products, including cigarettes, chewing tobacco, or electronic cigarettes. If you need help quitting, ask your health care provider. PREVENTION  The best way to protect yourself from getting a cold is to practice good hygiene.   Avoid oral or hand contact with people with cold symptoms.   Wash your hands often if contact occurs.  There is no clear evidence that vitamin C, vitamin E, echinacea, or exercise reduces the chance of developing a cold. However, it is always recommended to get plenty of rest, exercise, and practice good nutrition.  SEEK MEDICAL CARE IF:   You are getting worse rather than better.   Your symptoms are not controlled by medicine.   You have chills.  You have worsening shortness of breath.  You have brown or red mucus.  You have yellow or brown nasal discharge.  You have pain in your face, especially when you bend forward.  You have a fever.  You  have swollen neck glands.  You have pain while swallowing.  You have white areas in the back of your throat. SEEK IMMEDIATE MEDICAL CARE IF:   You have severe or persistent:  Headache.  Ear pain.  Sinus pain.  Chest pain.  You have chronic lung disease and any of the following:  Wheezing.  Prolonged cough.  Coughing up blood.  A change in your usual mucus.  You have a stiff neck.  You have changes in your:  Vision.  Hearing.  Thinking.  Mood. MAKE SURE YOU:   Understand these instructions.  Will watch your condition.  Will get help right away if you are not doing well or get worse.   This information is not intended to replace advice given to you by your health  care provider. Make sure you discuss any questions you have with your health care provider.   Document Released: 05/17/2001 Document Revised: 04/07/2015 Document Reviewed: 02/26/2014 Elsevier Interactive Patient Education [employer]2016 Elsevier Inc.

## 2015-09-27 ENCOUNTER — Telehealth: Payer: Self-pay | Admitting: Radiology

## 2015-09-27 NOTE — Telephone Encounter (Signed)
I called patient/ she called states she has cough, sinus congestion, wants cough meds. Is on antibiotics/ but needs a cough medication. I have advised her to come in today, we can not prescribe a cough medication without a visit. She voiced understanding. States she will come in. She does have a cough I can hear over the phone, I have advised Dr Neva SeatGreene of phone call, and he agrees patient should come in for evaluation.

## 2015-09-28 ENCOUNTER — Emergency Department (HOSPITAL_COMMUNITY): Payer: 59

## 2015-09-28 ENCOUNTER — Emergency Department (HOSPITAL_COMMUNITY)
Admission: EM | Admit: 2015-09-28 | Discharge: 2015-09-28 | Disposition: A | Discharge status: Home / Self Care | Payer: 59 | Attending: Emergency Medicine | Admitting: Emergency Medicine

## 2015-09-28 ENCOUNTER — Encounter (HOSPITAL_COMMUNITY): Payer: Self-pay | Admitting: Cardiology

## 2015-09-28 DIAGNOSIS — Z87440 Personal history of urinary (tract) infections: Secondary | ICD-10-CM | POA: Diagnosis not present

## 2015-09-28 DIAGNOSIS — Z7951 Long term (current) use of inhaled steroids: Secondary | ICD-10-CM | POA: Insufficient documentation

## 2015-09-28 DIAGNOSIS — F419 Anxiety disorder, unspecified: Secondary | ICD-10-CM | POA: Diagnosis not present

## 2015-09-28 DIAGNOSIS — Z8619 Personal history of other infectious and parasitic diseases: Secondary | ICD-10-CM | POA: Diagnosis not present

## 2015-09-28 DIAGNOSIS — Z792 Long term (current) use of antibiotics: Secondary | ICD-10-CM | POA: Insufficient documentation

## 2015-09-28 DIAGNOSIS — R05 Cough: Secondary | ICD-10-CM | POA: Diagnosis present

## 2015-09-28 DIAGNOSIS — G8929 Other chronic pain: Secondary | ICD-10-CM | POA: Insufficient documentation

## 2015-09-28 DIAGNOSIS — F329 Major depressive disorder, single episode, unspecified: Secondary | ICD-10-CM | POA: Insufficient documentation

## 2015-09-28 DIAGNOSIS — R059 Cough, unspecified: Secondary | ICD-10-CM

## 2015-09-28 DIAGNOSIS — Z791 Long term (current) use of non-steroidal anti-inflammatories (NSAID): Secondary | ICD-10-CM | POA: Diagnosis not present

## 2015-09-28 DIAGNOSIS — Z87891 Personal history of nicotine dependence: Secondary | ICD-10-CM | POA: Insufficient documentation

## 2015-09-28 DIAGNOSIS — J0110 Acute frontal sinusitis, unspecified: Secondary | ICD-10-CM | POA: Diagnosis not present

## 2015-09-28 DIAGNOSIS — Z8742 Personal history of other diseases of the female genital tract: Secondary | ICD-10-CM | POA: Diagnosis not present

## 2015-09-28 DIAGNOSIS — I10 Essential (primary) hypertension: Secondary | ICD-10-CM | POA: Diagnosis not present

## 2015-09-28 MED ORDER — BENZONATATE 100 MG PO CAPS: 100.0000 mg | ORAL_CAPSULE | Freq: Three times a day (TID) | ORAL | Status: DC

## 2015-09-28 NOTE — ED Notes (Signed)
Reports she was seen at [facility] last week for similar symptoms of cough, congestion and headache. Given antibiotics but is not feeling any better.

## 2015-09-28 NOTE — ED Provider Notes (Signed)
CSN: [id number]     Arrival date & time 09/28/15  1123 History  By signing my name below, I, [name], attest that this documentation has been prepared under the direction and in the presence of Burna Forts, PA-C Electronically Signed: [name], ED Scribe. 09/28/2015. 3:45 PM.   Chief Complaint  Patient presents with  . Cough  . Wheezing  . Headache      The history is provided by the patient. No language interpreter was used.    [name] is a 34 y.o. female who presents to the Emergency Department complaining of dry cough worse at night and laying down onset 2-3 weeks ago. She reports that her symptoms began with a cough that has worsened. She notes that she was seen at [facility] on 09/25/15 for similar symptoms and was Rx augmentin, nasal spray, and naprosyn Rx which has not resolved her symptoms. She states that she is having associated symptoms of wheezing, HA, sore throat, nasal congestion, sinus pressure. She states that she has tried Robitussin, mucinex, and tylenol cold severe with no relief for her symptoms. She denies fever, chills, and any other symptoms. She reports that she had surgery on her right TM for a perforated ear drum on 08/03/15. She notes that she has a medical hx of seasonal allergies that she takes Claritin for. Pt PCP is Olean Ree.  Past Medical History  Diagnosis Date  . Ectopic pregnancy   . Urinary tract infection   . Ovarian cyst   . Infection     UTI  . Back pain, chronic   . Depression     hx of   . Headache     occasional   . Dysrhythmia 2013    hx of a-fib  . Hypertension     was told, but never on meds  . Anxiety    Past Surgical History  Procedure Laterality Date  . Etopical surgery    . Cleft palate repair    . Laparoscopy for ectopic pregnancy  2011  . Ventral hernia repair N/A 03/26/2015    Procedure: LAPAROSCOPIC REPAIR OF INCARCERATED SUPERUMBILICAL HERNIA X2 AND UMBILICAL HERNIA REPAIR WITH MESH;  Surgeon: Karie Soda, MD;  Location: [facility];  Service: General;  Laterality: N/A;  . Tympanoplasty Right 08/03/2015    Procedure: RIGHT MEDIAL TECHNIQUE TYMPANOPLASTY;  Surgeon: Flo Shanks, MD;  Location: [facility];  Service: ENT;  Laterality: Right;   Family History  Problem Relation Age of Onset  . Other Neg Hx   . Hearing loss Neg Hx   . Hypertension Mother   . Hypertension Father   . Cancer Sister   . Heart disease Maternal Grandmother   . Cancer Maternal Grandfather    Social History  Substance Use Topics  . Smoking status: Former Smoker -- 0.25 packs/day for 15 years    Types: Cigarettes    Quit date: 10/02/2014  . Smokeless tobacco: Never Used  . Alcohol Use: 0.6 oz/week    1 Standard drinks or equivalent per week     Comment: social   OB History    Gravida Para Term Preterm AB TAB SAB Ectopic Multiple Living   2    2 0 1 1 0 0     Review of Systems  Constitutional: Positive for fatigue. Negative for fever and chills.  HENT: Positive for congestion, sinus pressure and sore throat.   Respiratory: Positive for cough and wheezing.   Neurological: Positive for headaches.  Allergies  Flexeril  Home Medications   Prior to Admission medications   Medication Sig Start Date End Date Taking? Authorizing Provider  albuterol (PROVENTIL HFA;VENTOLIN HFA) 108 (90 BASE) MCG/ACT inhaler Inhale 2 puffs into the lungs every 6 (six) hours as needed for wheezing or shortness of breath (cough, shortness of breath or wheezing.). 09/29/15   Wallis BambergMario Mani, PA-C  amoxicillin-clavulanate (AUGMENTIN) 875-125 MG tablet Take 1 tablet by mouth 2 (two) times daily. One po bid x 7 days 09/25/15   Kathrynn Speedobyn M Hess, PA-C  benzonatate (TESSALON) 100 MG capsule Take 1 capsule (100 mg total) by mouth every 8 (eight) hours. 09/28/15   Eyvonne MechanicJeffrey Esgar Barnick, PA-C  clonazePAM (KLONOPIN) 0.5 MG tablet Take 1 tablet (0.5 mg total) by mouth 2 (two) times daily as needed for anxiety. 09/07/15   Emi Belfasteborah B Gessner,  FNP  fluticasone (FLONASE) 50 MCG/ACT nasal spray Place 2 sprays into both nostrils daily. 09/25/15   Robyn M Hess, PA-C  HYDROcodone-homatropine (HYCODAN) 5-1.5 MG/5ML syrup Take 5 mLs by mouth every 6 (six) hours as needed for cough. 09/29/15   Wallis BambergMario Mani, PA-C  naproxen (NAPROSYN) 500 MG tablet Take 1 tablet (500 mg total) by mouth 2 (two) times daily. 09/25/15   Robyn M Hess, PA-C  phenol (CHLORASEPTIC) 1.4 % LIQD Use as directed 1 spray in the mouth or throat as needed for throat irritation / pain.    Historical Provider, MD   BP 126/84 mmHg  Pulse 71  Temp(Src) 98.1 F (36.7 C) (Oral)  Resp 18  Wt 128 lb (58.06 kg)  SpO2 100%  LMP 02/26/2015 Physical Exam  Constitutional: She is oriented to person, place, and time. She appears well-developed and well-nourished. No distress.  HENT:  Head: Normocephalic and atraumatic.  Right Ear: Tympanic membrane and ear canal normal.  Left Ear: Tympanic membrane and ear canal normal.  Eyes: EOM are normal.  Neck: Normal range of motion. Neck supple.  Cardiovascular: Normal rate, regular rhythm and normal heart sounds.  Exam reveals no gallop and no friction rub.   No murmur heard. Pulmonary/Chest: Effort normal and breath sounds normal. No respiratory distress. She has no wheezes. She has no rales.  Musculoskeletal: Normal range of motion.  Lymphadenopathy:    She has no cervical adenopathy.  Neurological: She is alert and oriented to person, place, and time. No cranial nerve deficit.  Reflex Scores:      Patellar reflexes are 2+ on the right side and 2+ on the left side. Skin: Skin is warm and dry.  Psychiatric: She has a normal mood and affect. Her behavior is normal.  Nursing note and vitals reviewed.   ED Course  Procedures (including critical care time) DIAGNOSTIC STUDIES: Oxygen Saturation is 100% on RA, nl by my interpretation.    COORDINATION OF CARE: 3:20 PM Discussed treatment plan with pt at bedside which includes continue  augmentin Rx and tessalon Rx and pt agreed to plan.    Labs Review Labs Reviewed - No data to display  Imaging Review Dg Chest 2 View  09/28/2015  CLINICAL DATA:  Cough EXAM: CHEST  2 VIEW COMPARISON:  03/28/2015 FINDINGS: Lungs are clear.  No pleural effusion or pneumothorax. The heart is normal in size. Visualized osseous structures are within normal limits. IMPRESSION: Normal chest radiographs. Electronically Signed   By: Charline BillsSriyesh  Krishnan M.D.   On: 09/28/2015 13:40   I have personally reviewed and evaluated these images as part of my medical decision-making.   EKG Interpretation None  MDM   Final diagnoses:  Cough  Acute frontal sinusitis, recurrence not specified    Labs:   Imaging: CXR: IMPRESSION: Normal chest radiographs.  Consults:   Therapeutics:   Discharge Meds: tessalon pearl Rx  Assessment/Plan: continue with augmentin Rx, continue with flonase Rx, use nasal saline rinses, and f/u with PCP if the symptoms persist or worsen  I personally performed the services described in this documentation, which was scribed in my presence. The recorded information has been reviewed and is accurate.    Eyvonne Mechanic, PA-C 09/29/15 1610  Donnetta Hutching, MD 10/01/15 [phone]

## 2015-09-28 NOTE — ED Notes (Signed)
Declined W/C at D/C and was escorted to lobby by RN. 

## 2015-09-28 NOTE — Discharge Instructions (Signed)

## 2015-09-29 ENCOUNTER — Ambulatory Visit (INDEPENDENT_AMBULATORY_CARE_PROVIDER_SITE_OTHER): Payer: 59 | Admitting: Urgent Care

## 2015-09-29 VITALS — BP 106/74 | HR 89 | Temp 97.8°F | Resp 18 | Ht 61.0 in | Wt 134.0 lb

## 2015-09-29 DIAGNOSIS — J329 Chronic sinusitis, unspecified: Secondary | ICD-10-CM | POA: Diagnosis not present

## 2015-09-29 DIAGNOSIS — J3489 Other specified disorders of nose and nasal sinuses: Secondary | ICD-10-CM | POA: Diagnosis not present

## 2015-09-29 DIAGNOSIS — R062 Wheezing: Secondary | ICD-10-CM | POA: Diagnosis not present

## 2015-09-29 DIAGNOSIS — R05 Cough: Secondary | ICD-10-CM | POA: Diagnosis not present

## 2015-09-29 DIAGNOSIS — R0981 Nasal congestion: Secondary | ICD-10-CM

## 2015-09-29 DIAGNOSIS — R059 Cough, unspecified: Secondary | ICD-10-CM

## 2015-09-29 MED ORDER — HYDROCODONE-HOMATROPINE 5-1.5 MG/5ML PO SYRP: 5.0000 mL | ORAL_SOLUTION | Freq: Four times a day (QID) | ORAL | Status: DC | PRN

## 2015-09-29 MED ORDER — ALBUTEROL SULFATE HFA 108 (90 BASE) MCG/ACT IN AERS: 2.0000 | INHALATION_SPRAY | Freq: Four times a day (QID) | RESPIRATORY_TRACT | Status: DC | PRN

## 2015-09-29 NOTE — Progress Notes (Signed)
    MRN: [id number] DOB: 08-09-1981  Subjective:   [name] is a 34 y.o. female presenting for chief complaint of Cough and Sinusitis  Reports 1 month history of productive cough, worsening in the last week, feels shob, wheezing especially at night, fatigue. Patient has also had sinus pain, sinus congestion, sinus headache. [facility], diagnosed with sinusitis, started on Augmentin, Tessalon perles, Flonase. She is also trying otc remedies like Naproxen and robitussin. Denies fever, chest pain, n/v, abdominal pain, eye pain, ear pain, ear drianage. Denies any other aggravating or relieving factors, no other questions or concerns.  [name] has a current medication list which includes the following prescription(s): amoxicillin-clavulanate, benzonatate, clonazepam, fluticasone, naproxen, and phenol. Also is allergic to flexeril.  [name]  has a past medical history of Ectopic pregnancy; Urinary tract infection; Ovarian cyst; Infection; Back pain, chronic; Depression; Headache; Dysrhythmia (2013); Hypertension; and Anxiety. Also  has past surgical history that includes etopical surgery; Cleft palate repair; Laparoscopy for ectopic pregnancy (2011); Ventral hernia repair (N/A, 03/26/2015); and Tympanoplasty (Right, 08/03/2015).  Objective:   Vitals: BP 106/74 mmHg  Pulse 89  Temp(Src) 97.8 F (36.6 C) (Oral)  Resp 18  Ht 5\' 1"  (1.549 m)  Wt 134 lb (60.782 kg)  BMI 25.33 kg/m2  SpO2 99%  LMP 02/26/2015  Physical Exam  Constitutional: She is oriented to person, place, and time. She appears well-developed and well-nourished.  HENT:  TM's flat bilaterally, right TM sclerotic, no effusions or erythema. Nasal turbinates inflamed and erythematous. Bilateral maxillary sinus tenderness, L>R. Postnasal drip present, without oropharyngeal exudates, erythema or abscesses.  Eyes: Right eye exhibits no discharge. Left eye exhibits no discharge. No scleral  icterus.  Neck: Normal range of motion. Neck supple.  Cardiovascular: Normal rate, regular rhythm and intact distal pulses.  Exam reveals no gallop and no friction rub.   No murmur heard. Pulmonary/Chest: No respiratory distress. She has wheezes (on forced expiration). She has no rales.  Musculoskeletal: She exhibits no edema.  Lymphadenopathy:    She has no cervical adenopathy.  Neurological: She is alert and oriented to person, place, and time.  Skin: Skin is warm and dry. No rash noted. No erythema. No pallor.   Assessment and Plan :   1. Sinusitis, unspecified chronicity, unspecified location 2. Sinus pain 3. Sinus congestion 4. Cough 5. Wheezing - Continue Augmentin, discussed possibility of LRI if no improvement with Augmentin. Symptomatic relief with APAP-codeine seen while here in clinic. Offered Hycodan and albuterol inhaler to help with persistent cough. Recommended Sudafed, Zyrtec and saline nasal spray otherwise.  Wallis BambergMario Halana Deisher, PA-C [facility] [phone] 09/29/2015 12:09 PM

## 2015-09-29 NOTE — Patient Instructions (Addendum)
- You may take Sudafed for sinus congestion - You can also take Zyrtec for antihistamine properties - Use saline nasal spray - If you are not better with Augmentin, we need to see you again to make sure that you don't have pneumonia or bronchitis which may need a different antibiotic    Sinusitis, Adult Sinusitis is redness, soreness, and inflammation of the paranasal sinuses. Paranasal sinuses are air pockets within the bones of your face. They are located beneath your eyes, in the middle of your forehead, and above your eyes. In healthy paranasal sinuses, mucus is able to drain out, and air is able to circulate through them by way of your nose. However, when your paranasal sinuses are inflamed, mucus and air can become trapped. This can allow bacteria and other germs to grow and cause infection. Sinusitis can develop quickly and last only a short time (acute) or continue over a long period (chronic). Sinusitis that lasts for more than 12 weeks is considered chronic. CAUSES Causes of sinusitis include:  Allergies.  Structural abnormalities, such as displacement of the cartilage that separates your nostrils (deviated septum), which can decrease the air flow through your nose and sinuses and affect sinus drainage.  Functional abnormalities, such as when the small hairs (cilia) that line your sinuses and help remove mucus do not work properly or are not present. SIGNS AND SYMPTOMS Symptoms of acute and chronic sinusitis are the same. The primary symptoms are pain and pressure around the affected sinuses. Other symptoms include:  Upper toothache.  Earache.  Headache.  Bad breath.  Decreased sense of smell and taste.  A cough, which worsens when you are lying flat.  Fatigue.  Fever.  Thick drainage from your nose, which often is green and may contain pus (purulent).  Swelling and warmth over the affected sinuses. DIAGNOSIS Your health care provider will perform a physical exam.  During your exam, your health care provider may perform any of the following to help determine if you have acute sinusitis or chronic sinusitis:  Look in your nose for signs of abnormal growths in your nostrils (nasal polyps).  Tap over the affected sinus to check for signs of infection.  View the inside of your sinuses using an imaging device that has a light attached (endoscope). If your health care provider suspects that you have chronic sinusitis, one or more of the following tests may be recommended:  Allergy tests.  Nasal culture. A sample of mucus is taken from your nose, sent to a lab, and screened for bacteria.  Nasal cytology. A sample of mucus is taken from your nose and examined by your health care provider to determine if your sinusitis is related to an allergy. TREATMENT Most cases of acute sinusitis are related to a viral infection and will resolve on their own within 10 days. Sometimes, medicines are prescribed to help relieve symptoms of both acute and chronic sinusitis. These may include pain medicines, decongestants, nasal steroid sprays, or saline sprays. However, for sinusitis related to a bacterial infection, your health care provider will prescribe antibiotic medicines. These are medicines that will help kill the bacteria causing the infection. Rarely, sinusitis is caused by a fungal infection. In these cases, your health care provider will prescribe antifungal medicine. For some cases of chronic sinusitis, surgery is needed. Generally, these are cases in which sinusitis recurs more than 3 times per year, despite other treatments. HOME CARE INSTRUCTIONS  Drink plenty of water. Water helps thin the mucus so  your sinuses can drain more easily.  Use a humidifier.  Inhale steam 3-4 times a day (for example, sit in the bathroom with the shower running).  Apply a warm, moist washcloth to your face 3-4 times a day, or as directed by your health care provider.  Use saline  nasal sprays to help moisten and clean your sinuses.  Take medicines only as directed by your health care provider.  If you were prescribed either an antibiotic or antifungal medicine, finish it all even if you start to feel better. SEEK IMMEDIATE MEDICAL CARE IF:  You have increasing pain or severe headaches.  You have nausea, vomiting, or drowsiness.  You have swelling around your face.  You have vision problems.  You have a stiff neck.  You have difficulty breathing.   This information is not intended to replace advice given to you by your health care provider. Make sure you discuss any questions you have with your health care provider.   Document Released: 11/21/2005 Document Revised: 12/12/2014 Document Reviewed: 12/06/2011 Elsevier Interactive Patient Education [employer]

## 2015-10-12 ENCOUNTER — Ambulatory Visit: Payer: 59 | Admitting: Family Medicine

## 2015-12-28 ENCOUNTER — Ambulatory Visit (INDEPENDENT_AMBULATORY_CARE_PROVIDER_SITE_OTHER): Payer: BLUE CROSS/BLUE SHIELD | Admitting: Family Medicine

## 2015-12-28 ENCOUNTER — Ambulatory Visit (INDEPENDENT_AMBULATORY_CARE_PROVIDER_SITE_OTHER): Payer: BLUE CROSS/BLUE SHIELD

## 2015-12-28 VITALS — BP 128/90 | HR 84 | Temp 97.5°F | Resp 18 | Ht 61.0 in | Wt 136.0 lb

## 2015-12-28 DIAGNOSIS — F411 Generalized anxiety disorder: Secondary | ICD-10-CM | POA: Diagnosis not present

## 2015-12-28 DIAGNOSIS — J329 Chronic sinusitis, unspecified: Secondary | ICD-10-CM

## 2015-12-28 DIAGNOSIS — R05 Cough: Secondary | ICD-10-CM | POA: Diagnosis not present

## 2015-12-28 DIAGNOSIS — R059 Cough, unspecified: Secondary | ICD-10-CM

## 2015-12-28 DIAGNOSIS — R062 Wheezing: Secondary | ICD-10-CM | POA: Diagnosis not present

## 2015-12-28 DIAGNOSIS — G4489 Other headache syndrome: Secondary | ICD-10-CM | POA: Diagnosis not present

## 2015-12-28 DIAGNOSIS — R0981 Nasal congestion: Secondary | ICD-10-CM

## 2015-12-28 LAB — POCT URINE PREGNANCY: Preg Test, Ur: NEGATIVE

## 2015-12-28 MED ORDER — HYDROXYZINE PAMOATE 25 MG PO CAPS: 25.0000 mg | ORAL_CAPSULE | Freq: Three times a day (TID) | ORAL | Status: DC | PRN

## 2015-12-28 MED ORDER — AMOXICILLIN-POT CLAVULANATE 875-125 MG PO TABS: 1.0000 | ORAL_TABLET | Freq: Two times a day (BID) | ORAL | Status: DC

## 2015-12-28 MED ORDER — ALBUTEROL SULFATE HFA 108 (90 BASE) MCG/ACT IN AERS: 2.0000 | INHALATION_SPRAY | Freq: Four times a day (QID) | RESPIRATORY_TRACT | Status: DC | PRN

## 2015-12-28 MED ORDER — HYDROCODONE-HOMATROPINE 5-1.5 MG/5ML PO SYRP: 5.0000 mL | ORAL_SOLUTION | Freq: Three times a day (TID) | ORAL | Status: DC | PRN

## 2015-12-28 MED ORDER — SERTRALINE HCL 25 MG PO TABS: 25.0000 mg | ORAL_TABLET | Freq: Every day | ORAL | Status: DC

## 2015-12-28 MED ORDER — FLUTICASONE PROPIONATE 50 MCG/ACT NA SUSP: 1.0000 | Freq: Every day | NASAL | Status: DC

## 2015-12-28 MED FILL — HYDROCODONE-HOMATROPINE SYR: 5-1.5 | 8 days supply | Qty: 120 | Fill #0

## 2015-12-28 MED FILL — SERTRALINE HCL 25 MG TABLET: 25 | 30 days supply | Qty: 30 | Fill #0

## 2015-12-28 MED FILL — VENTOLIN HFA 90 MCG INHALER: 108 (90 BAS | 21 days supply | Qty: 18 | Fill #0

## 2015-12-28 MED FILL — AMOX TR-K CLV 875-125 MG TA: 875-125 | 10 days supply | Qty: 20 | Fill #0

## 2015-12-28 MED FILL — FLUTICASONE PROP 50 MCG SPR: 50 | 30 days supply | Qty: 16 | Fill #0

## 2015-12-28 MED FILL — HYDROXYZINE PAM 25 MG CAP: 25 | 10 days supply | Qty: 30 | Fill #0

## 2015-12-28 NOTE — Patient Instructions (Signed)
Sertraline tablets What is this medicine? SERTRALINE (SER tra leen) is used to treat depression. It may also be used to treat obsessive compulsive disorder, panic disorder, post-trauma stress, premenstrual dysphoric disorder (PMDD) or social anxiety. This medicine may be used for other purposes; ask your health care provider or pharmacist if you have questions. What should I tell my health care provider before I take this medicine? They need to know if you have any of these conditions: -bipolar disorder or a family history of bipolar disorder -diabetes -glaucoma -heart disease -high blood pressure -history of irregular heartbeat -history of low levels of calcium, magnesium, or potassium in the blood -if you often drink alcohol -liver disease -receiving electroconvulsive therapy -seizures -suicidal thoughts, plans, or attempt; a previous suicide attempt by you or a family member -thyroid disease -an unusual or allergic reaction to sertraline, other medicines, foods, dyes, or preservatives -pregnant or trying to get pregnant -breast-feeding How should I use this medicine? Take this medicine by mouth with a glass of water. Follow the directions on the prescription label. You can take it with or without food. Take your medicine at regular intervals. Do not take your medicine more often than directed. Do not stop taking this medicine suddenly except upon the advice of your doctor. Stopping this medicine too quickly may cause serious side effects or your condition may worsen. A special MedGuide will be given to you by the pharmacist with each prescription and refill. Be sure to read this information carefully each time. Talk to your pediatrician regarding the use of this medicine in children. While this drug may be prescribed for children as young as 7 years for selected conditions, precautions do apply. Overdosage: If you think you have taken too much of this medicine contact a poison control  center or emergency room at once. NOTE: This medicine is only for you. Do not share this medicine with others. What if I miss a dose? If you miss a dose, take it as soon as you can. If it is almost time for your next dose, take only that dose. Do not take double or extra doses. What may interact with this medicine? Do not take this medicine with any of the following medications: -certain medicines for fungal infections like fluconazole, itraconazole, ketoconazole, posaconazole, voriconazole -cisapride -disulfiram -dofetilide -linezolid -MAOIs like Carbex, Eldepryl, Marplan, Nardil, and Parnate -metronidazole -methylene blue (injected into a vein) -pimozide -thioridazine -ziprasidone This medicine may also interact with the following medications: -alcohol -aspirin and aspirin-like medicines -certain medicines for depression, anxiety, or psychotic disturbances -certain medicines for irregular heart beat like flecainide, propafenone -certain medicines for migraine headaches like almotriptan, eletriptan, frovatriptan, naratriptan, rizatriptan, sumatriptan, zolmitriptan -certain medicines for sleep -certain medicines for seizures like carbamazepine, valproic acid, phenytoin -certain medicines that treat or prevent blood clots like warfarin, enoxaparin, dalteparin -cimetidine -digoxin -diuretics -fentanyl -furazolidone -isoniazid -lithium -NSAIDs, medicines for pain and inflammation, like ibuprofen or naproxen -other medicines that prolong the QT interval (cause an abnormal heart rhythm) -procarbazine -rasagiline -supplements like St. John's wort, kava kava, valerian -tolbutamide -tramadol -tryptophan This list may not describe all possible interactions. Give your health care provider a list of all the medicines, herbs, non-prescription drugs, or dietary supplements you use. Also tell them if you smoke, drink alcohol, or use illegal drugs. Some items may interact with your  medicine. What should I watch for while using this medicine? Tell your doctor if your symptoms do not get better or if they get worse. Visit your doctor   or health care professional for regular checks on your progress. Because it may take several weeks to see the full effects of this medicine, it is important to continue your treatment as prescribed by your doctor. Patients and their families should watch out for new or worsening thoughts of suicide or depression. Also watch out for sudden changes in feelings such as feeling anxious, agitated, panicky, irritable, hostile, aggressive, impulsive, severely restless, overly excited and hyperactive, or not being able to sleep. If this happens, especially at the beginning of treatment or after a change in dose, call your health care professional. You may get drowsy or dizzy. Do not drive, use machinery, or do anything that needs mental alertness until you know how this medicine affects you. Do not stand or sit up quickly, especially if you are an older patient. This reduces the risk of dizzy or fainting spells. Alcohol may interfere with the effect of this medicine. Avoid alcoholic drinks. Your mouth may get dry. Chewing sugarless gum or sucking hard candy, and drinking plenty of water may help. Contact your doctor if the problem does not go away or is severe. What side effects may I notice from receiving this medicine? Side effects that you should report to your doctor or health care professional as soon as possible: -allergic reactions like skin rash, itching or hives, swelling of the face, lips, or tongue -black or bloody stools, blood in the urine or vomit -fast, irregular heartbeat -feeling faint or lightheaded, falls -hallucination, loss of contact with reality -seizures -suicidal thoughts or other mood changes -unusual bleeding or bruising -unusually weak or tired -vomiting Side effects that usually do not require medical attention (report to your  doctor or health care professional if they continue or are bothersome): -change in appetite -change in sex drive or performance -diarrhea -increased sweating -indigestion, nausea -tremors This list may not describe all possible side effects. Call your doctor for medical advice about side effects. You may report side effects to FDA at 1-800-FDA-1088. Where should I keep my medicine? Keep out of the reach of children. Store at room temperature between 15 and 30 degrees C (59 and 86 degrees F). Throw away any unused medicine after the expiration date. NOTE: This sheet is a summary. It may not cover all possible information. If you have questions about this medicine, talk to your doctor, pharmacist, or health care provider.    2016, Elsevier/Gold Standard. (2013-06-18 12:57:35)  

## 2015-12-28 NOTE — Progress Notes (Signed)
Chief Complaint:  Chief Complaint  Patient presents with  . Cough    started a few weeks ago; worse at night  . Nasal Congestion    yellow-green sputum  . Headache    states her headaches can be throbbing sometimes  . Anxiety    only happens at work;  works at call center    HPI: [name] is a 35 y.o. female who reports to [facility] today complaining of cough , nasal congestion , green sputum , for 2 weeks, no improvement, worse at night, keep sher up . She coughs at work. She is having headaches, mostly on right side e and then some on left more heavy on right side. No fevers, she ahs had chills. No ear pain, + dizziness intermittently in a couple of weeks but she has had a ruptured right erdrum s/p repaur by ENT , she has foolow up today . Some blurriness, she ahs had to focus her eyes and they have been ok.  This started a little before workign at call center, she has no hx of asthma but has had allergies. She has had seasonal allergies, itchy eyes and water eyes .  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 She doe snot know for sure what her triggers are but she thinks she is afraid that she cannot help people on the phone, She works for [employer]. She is worried she can;t helpt he customers and od something worng. She is still in training. She ahs been with the job on Dec 19. She has been there only 1 month. She has had panic attacks when she was 18.She has never been put on any meds for anxiety but not sure. She was seen by psych at some point.   Past Medical History  Diagnosis Date  . Ectopic pregnancy   . Urinary tract infection   . Ovarian cyst   . Infection     UTI  . Back pain, chronic   . Depression     hx of   . Headache     occasional   . Dysrhythmia 2013    hx of a-fib  . Hypertension     was told, but never on meds  . Anxiety    Past Surgical History  Procedure  Laterality Date  . Etopical surgery    . Cleft palate repair    . Laparoscopy for ectopic pregnancy  2011  . Ventral hernia repair N/A 03/26/2015    Procedure: LAPAROSCOPIC REPAIR OF INCARCERATED SUPERUMBILICAL HERNIA X2 AND UMBILICAL HERNIA REPAIR WITH MESH;  Surgeon: Karie Soda, MD;  Location: [facility];  Service: General;  Laterality: N/A;  . Tympanoplasty Right 08/03/2015    Procedure: RIGHT MEDIAL TECHNIQUE TYMPANOPLASTY;  Surgeon: Flo Shanks, MD;  Location: [facility];  Service: ENT;  Laterality: Right;   Social History   Social History  . Marital Status: Single    Spouse Name: N/A  . Number of Children: N/A  . Years of Education: N/A   Social History Main Topics  . Smoking status: Former Smoker -- 0.25 packs/day for 15 years    Types: Cigarettes    Quit date: 10/02/2014  . Smokeless tobacco: Never Used  . Alcohol Use: 0.6 oz/week    1 Standard drinks or equivalent per week     Comment: social  . Drug Use:  No  . Sexual Activity: No     Comment: not had sex in over a year   Other Topics Concern  . None   Social History Narrative   Family History  Problem Relation Age of Onset  . Other Neg Hx   . Hearing loss Neg Hx   . Hypertension Mother   . Hypertension Father   . Cancer Sister   . Heart disease Maternal Grandmother   . Cancer Maternal Grandfather    Allergies  Allergen Reactions  . Flexeril [Cyclobenzaprine] Hives   Prior to Admission medications   Medication Sig Start Date End Date Taking? Authorizing Provider  albuterol (PROVENTIL HFA;VENTOLIN HFA) 108 (90 BASE) MCG/ACT inhaler Inhale 2 puffs into the lungs every 6 (six) hours as needed for wheezing or shortness of breath (cough, shortness of breath or wheezing.). 09/29/15  Yes Wallis Bamberg, PA-C  fluticasone [facility]) 50 MCG/ACT nasal spray Place into both nostrils daily.   Yes Historical Provider, MD     ROS: The patient denies fevers, chills, night sweats, unintentional weight loss,  chest pain, palpitations, wheezing, dyspnea on exertion, nausea, vomiting, abdominal pain, dysuria, hematuria, melena, numbness, weakness, or tingling.  All other systems have been reviewed and were otherwise negative with the exception of those mentioned in the HPI and as above.    PHYSICAL EXAM: Filed Vitals:   12/28/15 1029  BP: 128/90  Pulse: 84  Temp: 97.5 F (36.4 C)  Resp: 18   Body mass index is 25.71 kg/(m^2).   General: Alert, no acute distress HEENT:  Normocephalic, atraumatic, oropharynx patent. EOMI, PERRLA Right ear rupture s/p repair.  Erythematous throat, no exudates, maxilalry  + sinus tenderness, + erythematous/boggy nasal mucosa Cardiovascular:  Regular rate and rhythm, no rubs murmurs or gallops.  No Carotid bruits, radial pulse intact. No pedal edema.  Respiratory: Clear to auscultation bilaterally.  No wheezes, rales, or rhonchi.  No cyanosis, no use of accessory musculature Abdominal: No organomegaly, abdomen is soft and non-tender, positive bowel sounds. No masses. Skin: No rashes. Neurologic: Facial musculature symmetric. Psychiatric: Patient acts appropriately throughout our interaction. Lymphatic: No cervical or submandibular lymphadenopathy Musculoskeletal: Gait intact. No edema, tenderness   LABS: Results for orders placed or performed in visit on 12/28/15  POCT urine pregnancy  Result Value Ref Range   Preg Test, Ur Negative Negative     EKG/XRAY:   Primary read interpreted by Dr. Conley Rolls at [facility]. Increase vascualr markings, no infiltrates, no pneumo, no effusion   ASSESSMENT/PLAN: Encounter Diagnoses  Name Primary?  . Cough   . Sinusitis, unspecified chronicity, unspecified location Yes  . Sinus congestion   . Other headache syndrome   . Anxiety state   . Wheezing     Rx Augmentin Rx hycodan, albuterol inhaler, flonase Rx Zoloft and also vistaril Will return in 2 months to recheck anxiety with anxiety  Raw score 52 on Zung index, 65   Anxiety index--> marked to severe Precautiosn given for medications  Fu prn otherwise in 2 mons, no SI/HI/halluciantions   Gross sideeffects, risk and benefits, and alternatives of medications d/w patient. Patient is aware that all medications have potential sideeffects and we are unable to predict every sideeffect or drug-drug interaction that may occur.  Breanna Mcmanaway DO  12/28/2015 11:38 AM

## 2016-01-11 MED FILL — MEDROXYPROGESTERONE 10 MG T: 10 | 10 days supply | Qty: 10 | Fill #0

## 2016-01-16 ENCOUNTER — Ambulatory Visit (INDEPENDENT_AMBULATORY_CARE_PROVIDER_SITE_OTHER): Payer: BLUE CROSS/BLUE SHIELD | Admitting: Family Medicine

## 2016-01-16 ENCOUNTER — Ambulatory Visit (INDEPENDENT_AMBULATORY_CARE_PROVIDER_SITE_OTHER): Payer: BLUE CROSS/BLUE SHIELD

## 2016-01-16 ENCOUNTER — Ambulatory Visit (HOSPITAL_COMMUNITY): Admission: RE | Admit: 2016-01-16 | Payer: Self-pay | Source: Ambulatory Visit

## 2016-01-16 VITALS — BP 120/76 | HR 75 | Temp 98.5°F | Resp 16 | Ht 60.5 in | Wt 133.2 lb

## 2016-01-16 DIAGNOSIS — R059 Cough, unspecified: Secondary | ICD-10-CM

## 2016-01-16 DIAGNOSIS — M549 Dorsalgia, unspecified: Secondary | ICD-10-CM | POA: Diagnosis not present

## 2016-01-16 DIAGNOSIS — R05 Cough: Secondary | ICD-10-CM

## 2016-01-16 DIAGNOSIS — J069 Acute upper respiratory infection, unspecified: Secondary | ICD-10-CM

## 2016-01-16 MED ORDER — HYDROCODONE-HOMATROPINE 5-1.5 MG/5ML PO SYRP: 5.0000 mL | ORAL_SOLUTION | Freq: Three times a day (TID) | ORAL | Status: DC | PRN

## 2016-01-16 MED ORDER — BENZONATATE 100 MG PO CAPS: 100.0000 mg | ORAL_CAPSULE | Freq: Three times a day (TID) | ORAL | Status: DC | PRN

## 2016-01-16 NOTE — Patient Instructions (Signed)
Your lungs sounded clear today, I think you're still fighting off the upper respiratory infection. Continue the Augmentin that was prescribed last visit, Tessalon Perles up to 3 times per day as needed for cough, and cough syrup at night if needed. If you are wheezing you can use the albuterol but do not need to use this frequently if you're not having wheezing or shortness of breath. If not improving the next 5 days, return for recheck or sooner if worse. If you are requiring the albuterol more frequently, return for recheck sooner.  Your back pain is likely due to the coughing, can try heat or ice to the area, gentle range of motion and stretches, and Advil or Aleve over-the-counter as needed.  Return to the clinic or go to the nearest emergency room if any of your symptoms worsen or new symptoms occur.  Back Pain, Adult Back pain is very common in adults.The cause of back pain is rarely dangerous and the pain often gets better over time.The cause of your back pain may not be known. Some common causes of back pain include:  Strain of the muscles or ligaments supporting the spine.  Wear and tear (degeneration) of the spinal disks.  Arthritis.  Direct injury to the back. For many people, back pain may return. Since back pain is rarely dangerous, most people can learn to manage this condition on their own. HOME CARE INSTRUCTIONS Watch your back pain for any changes. The following actions may help to lessen any discomfort you are feeling:  Remain active. It is stressful on your back to sit or stand in one place for long periods of time. Do not sit, drive, or stand in one place for more than 30 minutes at a time. Take short walks on even surfaces as soon as you are able.Try to increase the length of time you walk each day.  Exercise regularly as directed by your health care provider. Exercise helps your back heal faster. It also helps avoid future injury by keeping your muscles strong and  flexible.  Do not stay in bed.Resting more than 1-2 days can delay your recovery.  Pay attention to your body when you bend and lift. The most comfortable positions are those that put less stress on your recovering back. Always use proper lifting techniques, including:  Bending your knees.  Keeping the load close to your body.  Avoiding twisting.  Find a comfortable position to sleep. Use a firm mattress and lie on your side with your knees slightly bent. If you lie on your back, put a pillow under your knees.  Avoid feeling anxious or stressed.Stress increases muscle tension and can worsen back pain.It is important to recognize when you are anxious or stressed and learn ways to manage it, such as with exercise.  Take medicines only as directed by your health care provider. Over-the-counter medicines to reduce pain and inflammation are often the most helpful.Your health care provider may prescribe muscle relaxant drugs.These medicines help dull your pain so you can more quickly return to your normal activities and healthy exercise.  Apply ice to the injured area:  Put ice in a plastic bag.  Place a towel between your skin and the bag.  Leave the ice on for 20 minutes, 2-3 times a day for the first 2-3 days. After that, ice and heat may be alternated to reduce pain and spasms.  Maintain a healthy weight. Excess weight puts extra stress on your back and makes it difficult to  maintain good posture. SEEK MEDICAL CARE IF:  You have pain that is not relieved with rest or medicine.  You have increasing pain going down into the legs or buttocks.  You have pain that does not improve in one week.  You have night pain.  You lose weight.  You have a fever or chills. SEEK IMMEDIATE MEDICAL CARE IF:   You develop new bowel or bladder control problems.  You have unusual weakness or numbness in your arms or legs.  You develop nausea or vomiting.  You develop abdominal  pain.  You feel faint.   This information is not intended to replace advice given to you by your health care provider. Make sure you discuss any questions you have with your health care provider.   Document Released: 11/21/2005 Document Revised: 12/12/2014 Document Reviewed: 03/25/2014 Elsevier Interactive Patient Education 2016 [employer]   Upper Respiratory Infection, Adult Most upper respiratory infections (URIs) are a viral infection of the air passages leading to the lungs. A URI affects the nose, throat, and upper air passages. The most common type of URI is nasopharyngitis and is typically referred to as "the common cold." URIs run their course and usually go away on their own. Most of the time, a URI does not require medical attention, but sometimes a bacterial infection in the upper airways can follow a viral infection. This is called a secondary infection. Sinus and middle ear infections are common types of secondary upper respiratory infections. Bacterial pneumonia can also complicate a URI. A URI can worsen asthma and chronic obstructive pulmonary disease (COPD). Sometimes, these complications can require emergency medical care and may be life threatening.  CAUSES Almost all URIs are caused by viruses. A virus is a type of germ and can spread from one person to another.  RISKS FACTORS You may be at risk for a URI if:   You smoke.   You have chronic heart or lung disease.  You have a weakened defense (immune) system.   You are very young or very old.   You have nasal allergies or asthma.  You work in crowded or poorly ventilated areas.  You work in health care facilities or schools. SIGNS AND SYMPTOMS  Symptoms typically develop 2-3 days after you come in contact with a cold virus. Most viral URIs last 7-10 days. However, viral URIs from the influenza virus (flu virus) can last 14-18 days and are typically more severe. Symptoms may include:   Runny or stuffy  (congested) nose.   Sneezing.   Cough.   Sore throat.   Headache.   Fatigue.   Fever.   Loss of appetite.   Pain in your forehead, behind your eyes, and over your cheekbones (sinus pain).  Muscle aches.  DIAGNOSIS  Your health care provider may diagnose a URI by:  Physical exam.  Tests to check that your symptoms are not due to another condition such as:  Strep throat.  Sinusitis.  Pneumonia.  Asthma. TREATMENT  A URI goes away on its own with time. It cannot be cured with medicines, but medicines may be prescribed or recommended to relieve symptoms. Medicines may help:  Reduce your fever.  Reduce your cough.  Relieve nasal congestion. HOME CARE INSTRUCTIONS   Take medicines only as directed by your health care provider.   Gargle warm saltwater or take cough drops to comfort your throat as directed by your health care provider.  Use a warm mist humidifier or inhale steam from a  shower to increase air moisture. This may make it easier to breathe.  Drink enough fluid to keep your urine clear or pale yellow.   Eat soups and other clear broths and maintain good nutrition.   Rest as needed.   Return to work when your temperature has returned to normal or as your health care provider advises. You may need to stay home longer to avoid infecting others. You can also use a face mask and careful hand washing to prevent spread of the virus.  Increase the usage of your inhaler if you have asthma.   Do not use any tobacco products, including cigarettes, chewing tobacco, or electronic cigarettes. If you need help quitting, ask your health care provider. PREVENTION  The best way to protect yourself from getting a cold is to practice good hygiene.   Avoid oral or hand contact with people with cold symptoms.   Wash your hands often if contact occurs.  There is no clear evidence that vitamin C, vitamin E, echinacea, or exercise reduces the chance of  developing a cold. However, it is always recommended to get plenty of rest, exercise, and practice good nutrition.  SEEK MEDICAL CARE IF:   You are getting worse rather than better.   Your symptoms are not controlled by medicine.   You have chills.  You have worsening shortness of breath.  You have brown or red mucus.  You have yellow or brown nasal discharge.  You have pain in your face, especially when you bend forward.  You have a fever.  You have swollen neck glands.  You have pain while swallowing.  You have white areas in the back of your throat. SEEK IMMEDIATE MEDICAL CARE IF:   You have severe or persistent:  Headache.  Ear pain.  Sinus pain.  Chest pain.  You have chronic lung disease and any of the following:  Wheezing.  Prolonged cough.  Coughing up blood.  A change in your usual mucus.  You have a stiff neck.  You have changes in your:  Vision.  Hearing.  Thinking.  Mood. MAKE SURE YOU:   Understand these instructions.  Will watch your condition.  Will get help right away if you are not doing well or get worse.   This information is not intended to replace advice given to you by your health care provider. Make sure you discuss any questions you have with your health care provider.   Document Released: 05/17/2001 Document Revised: 04/07/2015 Document Reviewed: 02/26/2014 Elsevier Interactive Patient Education 2016 Elsevier Inc[employer] Cough, Adult Coughing is a reflex that clears your throat and your airways. Coughing helps to heal and protect your lungs. It is normal to cough occasionally, but a cough that happens with other symptoms or lasts a long time may be a sign of a condition that needs treatment. A cough may last only 2-3 weeks (acute), or it may last longer than 8 weeks (chronic). CAUSES Coughing is commonly caused by:  Breathing in substances that irritate your lungs.  A viral or bacterial respiratory  infection.  Allergies.  Asthma.  Postnasal drip.  Smoking.  Acid backing up from the stomach into the esophagus (gastroesophageal reflux).  Certain medicines.  Chronic lung problems, including COPD (or rarely, lung cancer).  Other medical conditions such as heart failure. HOME CARE INSTRUCTIONS  Pay attention to any changes in your symptoms. Take these actions to help with your discomfort:  Take medicines only as told by your health care provider.  If  you were prescribed an antibiotic medicine, take it as told by your health care provider. Do not stop taking the antibiotic even if you start to feel better.  Talk with your health care provider before you take a cough suppressant medicine.  Drink enough fluid to keep your urine clear or pale yellow.  If the air is dry, use a cold steam vaporizer or humidifier in your bedroom or your home to help loosen secretions.  Avoid anything that causes you to cough at work or at home.  If your cough is worse at night, try sleeping in a semi-upright position.  Avoid cigarette smoke. If you smoke, quit smoking. If you need help quitting, ask your health care provider.  Avoid caffeine.  Avoid alcohol.  Rest as needed. SEEK MEDICAL CARE IF:   You have new symptoms.  You cough up pus.  Your cough does not get better after 2-3 weeks, or your cough gets worse.  You cannot control your cough with suppressant medicines and you are losing sleep.  You develop pain that is getting worse or pain that is not controlled with pain medicines.  You have a fever.  You have unexplained weight loss.  You have night sweats. SEEK IMMEDIATE MEDICAL CARE IF:  You cough up blood.  You have difficulty breathing.  Your heartbeat is very fast.   This information is not intended to replace advice given to you by your health care provider. Make sure you discuss any questions you have with your health care provider.   Document Released:  05/20/2011 Document Revised: 08/12/2015 Document Reviewed: 01/28/2015 Elsevier Interactive Patient Education [employer]

## 2016-01-16 NOTE — Progress Notes (Addendum)
Subjective:  By signing my name below, I, [name], attest that this documentation has been prepared under the direction and in the presence of Breanna Staggers, MD.  [name], Medical Scribe. 01/16/2016.  12:41 PM.   Patient ID: [name], female    DOB: [birth year], 35 y.o.   MRN: [id number]  Chief Complaint  Patient presents with  . Cough    x 1wk  . back pain    mid to lower back x 3 days    HPI HPI Comments: [name] is a 36 y.o. female who presents to [facility] complaining of a persistent cough, onset 1 week ago. Pt reports that she has tried different OTC medications, in addition to Augmentin and a cough syrup at night time both prescribed at last visit, with not much improvement. She indicates that her cough has worsened and become much drier, however present with occasional green mucus. Pt is also taking an albuterol inhaler every 4-6 hours due to shortness of breath which she reports she finds much relief with. Last albuterol intake was about 4 hours ago. She denies fever. Pt no longer has the cough syrup in stock.  Pt is also complaining of back pain that is usually present on the lest side of the back and moves to her hip area.  Pt is compliant with taking naproxen BID. She indicates that she has tried prednisone previously, however she did not find much relief with it. She denies experiencing any side effects with the medication. She denies bowel or urinary incontinence, or numbness of the area.   Patient Active Problem List   Diagnosis Date Noted  . Ileus (HCC) 03/28/2015  . Incarcerated ventral hernia s/p lap repair w mesh 03/25/2014 03/26/2015  . Umbilical hernia s/p lap repair w mesh 03/25/2014 03/26/2015  . Bipolar disorder, unspecified (HCC) 09/01/2014  . Back muscle spasm 09/01/2014  . Smoking 09/01/2014  . Elevated BP 09/01/2014  . Atrial fibrillation, new 01/13/2013  . Nausea and vomiting in adult 01/13/2013    . Hypertension 01/13/2013   Past Medical History  Diagnosis Date  . Ectopic pregnancy   . Urinary tract infection   . Ovarian cyst   . Infection     UTI  . Back pain, chronic   . Depression     hx of   . Headache     occasional   . Dysrhythmia 2013    hx of a-fib  . Hypertension     was told, but never on meds  . Anxiety    Past Surgical History  Procedure Laterality Date  . Etopical surgery    . Cleft palate repair    . Laparoscopy for ectopic pregnancy  2011  . Ventral hernia repair N/A 03/26/2015    Procedure: LAPAROSCOPIC REPAIR OF INCARCERATED SUPERUMBILICAL HERNIA X2 AND UMBILICAL HERNIA REPAIR WITH MESH;  Surgeon: Karie Soda, MD;  Location: [facility];  Service: General;  Laterality: N/A;  . Tympanoplasty Right 08/03/2015    Procedure: RIGHT MEDIAL TECHNIQUE TYMPANOPLASTY;  Surgeon: Flo Shanks, MD;  Location: [facility];  Service: ENT;  Laterality: Right;   Allergies  Allergen Reactions  . Flexeril [Cyclobenzaprine] Hives   Prior to Admission medications   Medication Sig Start Date End Date Taking? Authorizing Provider  albuterol (PROVENTIL HFA;VENTOLIN HFA) 108 (90 Base) MCG/ACT inhaler Inhale 2 puffs into the lungs every 6 (six) hours as needed for wheezing or shortness of breath (cough, shortness  of breath or wheezing.). 12/28/15  Yes Thao P Le, DO  amoxicillin-clavulanate (AUGMENTIN) 875-125 MG tablet Take 1 tablet by mouth 2 (two) times daily. One po bid x 10 days 12/28/15  Yes Thao P Le, DO  fluticasone (FLONASE) 50 MCG/ACT nasal spray Place 1 spray into both nostrils daily. 12/28/15  Yes Thao P Le, DO  hydrOXYzine (VISTARIL) 25 MG capsule Take 1 capsule (25 mg total) by mouth every 8 (eight) hours as needed for anxiety. 12/28/15  Yes Thao P Le, DO  sertraline (ZOLOFT) 25 MG tablet Take 1 tablet (25 mg total) by mouth daily. 12/28/15  Yes Thao P Le, DO  HYDROcodone-homatropine (HYCODAN) 5-1.5 MG/5ML syrup Take 5 mLs by mouth every 8 (eight) hours as  needed for cough. Patient not taking: Reported on 01/16/2016 12/28/15   Lenell Antu, DO   Social History   Social History  . Marital Status: Single    Spouse Name: N/A  . Number of Children: N/A  . Years of Education: N/A   Occupational History  . Not on file.   Social History Main Topics  . Smoking status: Former Smoker -- 0.25 packs/day for 15 years    Types: Cigarettes    Quit date: 10/02/2014  . Smokeless tobacco: Never Used  . Alcohol Use: 0.6 oz/week    1 Standard drinks or equivalent per week     Comment: social  . Drug Use: No  . Sexual Activity: No     Comment: not had sex in over a year   Other Topics Concern  . Not on file   Social History Narrative    Review of Systems  Constitutional: Negative for fever.  Respiratory: Positive for cough and shortness of breath.   Genitourinary: Negative for enuresis.  Musculoskeletal: Positive for back pain.  Neurological: Negative for numbness.      Objective:   Physical Exam  Constitutional: She is oriented to person, place, and time. She appears well-developed and well-nourished. No distress.  HENT:  Head: Normocephalic and atraumatic.  Right Ear: Hearing, tympanic membrane, external ear and ear canal normal.  Left Ear: Hearing, tympanic membrane, external ear and ear canal normal.  Nose: Nose normal.  Mouth/Throat: Oropharynx is clear and moist. No oropharyngeal exudate.  Moist oral mucosa. TM's are pearly grey.  Eyes: Conjunctivae and EOM are normal. Pupils are equal, round, and reactive to light.  Neck: Neck supple.  Cardiovascular: Normal rate, regular rhythm, normal heart sounds and intact distal pulses.   No murmur heard. Pulmonary/Chest: Effort normal and breath sounds normal. No respiratory distress. She has no wheezes. She has no rhonchi.  Musculoskeletal: She exhibits tenderness.  No mid line bony tenderness.  Tenderness along the paraspinal muscles on the lower thoracic and lumbar spine, left greater  than right. Negative seated straight leg raise.   Neurological: She is alert and oriented to person, place, and time. No cranial nerve deficit.  Skin: Skin is warm and dry. No rash noted.   Skin of the back is intact. No erythema.  Psychiatric: She has a normal mood and affect. Her behavior is normal.  Nursing note and vitals reviewed.   Filed Vitals:   01/16/16 1149  BP: 120/76  Pulse: 75  Temp: 98.5 F (36.9 C)  TempSrc: Oral  Resp: 16  Height: 5' 0.5" (1.537 m)  Weight: 133 lb 3.2 oz (60.419 kg)  SpO2: 98%   Dg Chest 2 View  01/16/2016  CLINICAL DATA:  Cough and fatigue EXAM: CHEST  2 VIEW COMPARISON:  December 28, 2015 FINDINGS: There is no edema or consolidation. The heart size and pulmonary vascularity are normal. 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 No adenopathy. No bone lesions. There is mild upper thoracic dextroscoliosis. IMPRESSION: No edema or consolidation. Electronically Signed   By: Bretta Bang III M.D.   On: 01/16/2016 13:15   Dg Chest 2 View  12/28/2015  CLINICAL DATA:  Cough, upper respiratory infection symptoms EXAM: CHEST  2 VIEW COMPARISON:  PA and lateral chest x-ray of September 28, 2015 FINDINGS: The lungs are adequately inflated and clear. There is a right-sided aortic arch. The heart and pulmonary vascularity are normal. The mediastinum is normal in width. The trachea is midline. There is no pleural effusion. The bony thorax exhibits no acute abnormality. The gastric air bubble and cardiac apex are on the left. IMPRESSION: There is no active cardiopulmonary disease. Electronically Signed   By: David  [place] M.D.   On: 12/28/2015 11:31        Assessment & Plan:   ENZLEY KITCHENS is a 35 y.o. female Cough - Plan: DG Chest 2 View, HYDROcodone-homatropine (HYCODAN) 5-1.5 MG/5ML syrup, benzonatate (TESSALON) 100 MG capsule  Acute upper respiratory infection  Left-sided back pain, unspecified location  Persistent  upper respiratory infection, with back strain/mm spasm from cough.  Lungs clear, may be able to space out albuterol to only if needed for wheezing/bronchospasm.   -tessalon perles tid prn, albuterol if needed as above.   -Continue Augmentin  -heat or ice and gentle rom/stretches, advil or alleve if needed for back pain  - rtc precautions.    Meds ordered this encounter  Medications  . HYDROcodone-homatropine (HYCODAN) 5-1.5 MG/5ML syrup    Sig: Take 5 mLs by mouth every 8 (eight) hours as needed for cough.    Dispense:  120 mL    Refill:  0  . benzonatate (TESSALON) 100 MG capsule    Sig: Take 1 capsule (100 mg total) by mouth 3 (three) times daily as needed for cough.    Dispense:  20 capsule    Refill:  0   Patient Instructions  Your lungs sounded clear today, I think you're still fighting off the upper respiratory infection. Continue the Augmentin that was prescribed last visit, Tessalon Perles up to 3 times per day as needed for cough, and cough syrup at night if needed. If you are wheezing you can use the albuterol but do not need to use this frequently if you're not having wheezing or shortness of breath. If not improving the next 5 days, return for recheck or sooner if worse. If you are requiring the albuterol more frequently, return for recheck sooner.  Your back pain is likely due to the coughing, can try heat or ice to the area, gentle range of motion and stretches, and Advil or Aleve over-the-counter as needed.  Return to the clinic or go to the nearest emergency room if any of your symptoms worsen or new symptoms occur.  Back Pain, Adult Back pain is very common in adults.The cause of back pain is rarely dangerous and the pain often gets better over time.The cause of your back pain may not be known. Some common causes of back pain include:  Strain of the muscles or ligaments supporting the spine.  Wear and tear (degeneration) of the spinal disks.  Arthritis.  Direct  injury to the back. For many people, back pain may return. Since back pain  is rarely dangerous, most people can learn to manage this condition on their own. HOME CARE INSTRUCTIONS Watch your back pain for any changes. The following actions may help to lessen any discomfort you are feeling:  Remain active. It is stressful on your back to sit or stand in one place for long periods of time. Do not sit, drive, or stand in one place for more than 30 minutes at a time. Take short walks on even surfaces as soon as you are able.Try to increase the length of time you walk each day.  Exercise regularly as directed by your health care provider. Exercise helps your back heal faster. It also helps avoid future injury by keeping your muscles strong and flexible.  Do not stay in bed.Resting more than 1-2 days can delay your recovery.  Pay attention to your body when you bend and lift. The most comfortable positions are those that put less stress on your recovering back. Always use proper lifting techniques, including:  Bending your knees.  Keeping the load close to your body.  Avoiding twisting.  Find a comfortable position to sleep. Use a firm mattress and lie on your side with your knees slightly bent. If you lie on your back, put a pillow under your knees.  Avoid feeling anxious or stressed.Stress increases muscle tension and can worsen back pain.It is important to recognize when you are anxious or stressed and learn ways to manage it, such as with exercise.  Take medicines only as directed by your health care provider. Over-the-counter medicines to reduce pain and inflammation are often the most helpful.Your health care provider may prescribe muscle relaxant drugs.These medicines help dull your pain so you can more quickly return to your normal activities and healthy exercise.  Apply ice to the injured area:  Put ice in a plastic bag.  Place a towel between your skin and the bag.  Leave the  ice on for 20 minutes, 2-3 times a day for the first 2-3 days. After that, ice and heat may be alternated to reduce pain and spasms.  Maintain a healthy weight. Excess weight puts extra stress on your back and makes it difficult to maintain good posture. SEEK MEDICAL CARE IF:  You have pain that is not relieved with rest or medicine.  You have increasing pain going down into the legs or buttocks.  You have pain that does not improve in one week.  You have night pain.  You lose weight.  You have a fever or chills. SEEK IMMEDIATE MEDICAL CARE IF:   You develop new bowel or bladder control problems.  You have unusual weakness or numbness in your arms or legs.  You develop nausea or vomiting.  You develop abdominal pain.  You feel faint.   This information is not intended to replace advice given to you by your health care provider. Make sure you discuss any questions you have with your health care provider.   Document Released: 11/21/2005 Document Revised: 12/12/2014 Document Reviewed: 03/25/2014 Elsevier Interactive Patient Education 2016 Else[employer] Inc[employer]   Upper Respiratory Infection, Adult Most upper respiratory infections (URIs) are a viral infection of the air passages leading to the lungs. A URI affects the nose, throat, and upper air passages. The most common type of URI is nasopharyngitis and is typically referred to as "the common cold." URIs run their course and usually go away on their own. Most of the time, a URI does not require medical attention, but sometimes a bacterial infection  in the upper airways can follow a viral infection. This is called a secondary infection. Sinus and middle ear infections are common types of secondary upper respiratory infections. Bacterial pneumonia can also complicate a URI. A URI can worsen asthma and chronic obstructive pulmonary disease (COPD). Sometimes, these complications can require emergency medical care and may be life threatening.    CAUSES Almost all URIs are caused by viruses. A virus is a type of germ and can spread from one person to another.  RISKS FACTORS You may be at risk for a URI if:   You smoke.   You have chronic heart or lung disease.  You have a weakened defense (immune) system.   You are very young or very old.   You have nasal allergies or asthma.  You work in crowded or poorly ventilated areas.  You work in health care facilities or schools. SIGNS AND SYMPTOMS  Symptoms typically develop 2-3 days after you come in contact with a cold virus. Most viral URIs last 7-10 days. However, viral URIs from the influenza virus (flu virus) can last 14-18 days and are typically more severe. Symptoms may include:   Runny or stuffy (congested) nose.   Sneezing.   Cough.   Sore throat.   Headache.   Fatigue.   Fever.   Loss of appetite.   Pain in your forehead, behind your eyes, and over your cheekbones (sinus pain).  Muscle aches.  DIAGNOSIS  Your health care provider may diagnose a URI by:  Physical exam.  Tests to check that your symptoms are not due to another condition such as:  Strep throat.  Sinusitis.  Pneumonia.  Asthma. TREATMENT  A URI goes away on its own with time. It cannot be cured with medicines, but medicines may be prescribed or recommended to relieve symptoms. Medicines may help:  Reduce your fever.  Reduce your cough.  Relieve nasal congestion. HOME CARE INSTRUCTIONS   Take medicines only as directed by your health care provider.   Gargle warm saltwater or take cough drops to comfort your throat as directed by your health care provider.  Use a warm mist humidifier or inhale steam from a shower to increase air moisture. This may make it easier to breathe.  Drink enough fluid to keep your urine clear or pale yellow.   Eat soups and other clear broths and maintain good nutrition.   Rest as needed.   Return to work when your temperature  has returned to normal or as your health care provider advises. You may need to stay home longer to avoid infecting others. You can also use a face mask and careful hand washing to prevent spread of the virus.  Increase the usage of your inhaler if you have asthma.   Do not use any tobacco products, including cigarettes, chewing tobacco, or electronic cigarettes. If you need help quitting, ask your health care provider. PREVENTION  The best way to protect yourself from getting a cold is to practice good hygiene.   Avoid oral or hand contact with people with cold symptoms.   Wash your hands often if contact occurs.  There is no clear evidence that vitamin C, vitamin E, echinacea, or exercise reduces the chance of developing a cold. However, it is always recommended to get plenty of rest, exercise, and practice good nutrition.  SEEK MEDICAL CARE IF:   You are getting worse rather than better.   Your symptoms are not controlled by medicine.   You have  chills.  You have worsening shortness of breath.  You have brown or red mucus.  You have yellow or brown nasal discharge.  You have pain in your face, especially when you bend forward.  You have a fever.  You have swollen neck glands.  You have pain while swallowing.  You have white areas in the back of your throat. SEEK IMMEDIATE MEDICAL CARE IF:   You have severe or persistent:  Headache.  Ear pain.  Sinus pain.  Chest pain.  You have chronic lung disease and any of the following:  Wheezing.  Prolonged cough.  Coughing up blood.  A change in your usual mucus.  You have a stiff neck.  You have changes in your:  Vision.  Hearing.  Thinking.  Mood. MAKE SURE YOU:   Understand these instructions.  Will watch your condition.  Will get help right away if you are not doing well or get worse.   This information is not intended to replace advice given to you by your health care provider. Make sure  you discuss any questions you have with your health care provider.   Document Released: 05/17/2001 Document Revised: 04/07/2015 Document Reviewed: 02/26/2014 Elsevier Interactive Patient Education 2016 Elsevier Inc. Cough, Adult Coughing is a reflex that clears your throat and your airways. Coughing helps to heal and protect your lungs. It is normal to cough occasionally, but a cough that happens with other symptoms or lasts a long time may be a sign of a condition that needs treatment. A cough may last only 2-3 weeks (acute), or it may last longer than 8 weeks (chronic). CAUSES Coughing is commonly caused by:  Breathing in substances that irritate your lungs.  A viral or bacterial respiratory infection.  Allergies.  Asthma.  Postnasal drip.  Smoking.  Acid backing up from the stomach into the esophagus (gastroesophageal reflux).  Certain medicines.  Chronic lung problems, including COPD (or rarely, lung cancer).  Other medical conditions such as heart failure. HOME CARE INSTRUCTIONS  Pay attention to any changes in your symptoms. Take these actions to help with your discomfort:  Take medicines only as told by your health care provider.  If you were prescribed an antibiotic medicine, take it as told by your health care provider. Do not stop taking the antibiotic even if you start to feel better.  Talk with your health care provider before you take a cough suppressant medicine.  Drink enough fluid to keep your urine clear or pale yellow.  If the air is dry, use a cold steam vaporizer or humidifier in your bedroom or your home to help loosen secretions.  Avoid anything that causes you to cough at work or at home.  If your cough is worse at night, try sleeping in a semi-upright position.  Avoid cigarette smoke. If you smoke, quit smoking. If you need help quitting, ask your health care provider.  Avoid caffeine.  Avoid alcohol.  Rest as needed. SEEK MEDICAL CARE IF:     You have new symptoms.  You cough up pus.  Your cough does not get better after 2-3 weeks, or your cough gets worse.  You cannot control your cough with suppressant medicines and you are losing sleep.  You develop pain that is getting worse or pain that is not controlled with pain medicines.  You have a fever.  You have unexplained weight loss.  You have night sweats. SEEK IMMEDIATE MEDICAL CARE IF:  You cough up blood.  You have difficulty  breathing.  Your heartbeat is very fast.   This information is not intended to replace advice given to you by your health care provider. Make sure you discuss any questions you have with your health care provider.   Document Released: 05/20/2011 Document Revised: 08/12/2015 Document Reviewed: 01/28/2015 Elsevier Interactive Patient Education [employer]    .  I personally performed the services described in this documentation, which was scribed in my presence. The recorded information has been reviewed and considered, and addended by me as needed.

## 2016-02-25 ENCOUNTER — Encounter: Payer: Self-pay | Admitting: Internal Medicine

## 2016-03-03 ENCOUNTER — Encounter (HOSPITAL_COMMUNITY): Payer: Self-pay | Admitting: Emergency Medicine

## 2016-03-03 ENCOUNTER — Emergency Department (HOSPITAL_COMMUNITY)
Admission: EM | Admit: 2016-03-03 | Discharge: 2016-03-03 | Disposition: A | Discharge status: Home / Self Care | Payer: BLUE CROSS/BLUE SHIELD | Attending: Emergency Medicine | Admitting: Emergency Medicine

## 2016-03-03 DIAGNOSIS — Z8619 Personal history of other infectious and parasitic diseases: Secondary | ICD-10-CM | POA: Insufficient documentation

## 2016-03-03 DIAGNOSIS — G8929 Other chronic pain: Secondary | ICD-10-CM | POA: Insufficient documentation

## 2016-03-03 DIAGNOSIS — Z8719 Personal history of other diseases of the digestive system: Secondary | ICD-10-CM | POA: Diagnosis not present

## 2016-03-03 DIAGNOSIS — F329 Major depressive disorder, single episode, unspecified: Secondary | ICD-10-CM | POA: Insufficient documentation

## 2016-03-03 DIAGNOSIS — R05 Cough: Secondary | ICD-10-CM | POA: Insufficient documentation

## 2016-03-03 DIAGNOSIS — Z7951 Long term (current) use of inhaled steroids: Secondary | ICD-10-CM | POA: Insufficient documentation

## 2016-03-03 DIAGNOSIS — R110 Nausea: Secondary | ICD-10-CM | POA: Diagnosis not present

## 2016-03-03 DIAGNOSIS — Z8742 Personal history of other diseases of the female genital tract: Secondary | ICD-10-CM | POA: Insufficient documentation

## 2016-03-03 DIAGNOSIS — R1030 Lower abdominal pain, unspecified: Secondary | ICD-10-CM

## 2016-03-03 DIAGNOSIS — R197 Diarrhea, unspecified: Secondary | ICD-10-CM | POA: Insufficient documentation

## 2016-03-03 DIAGNOSIS — I10 Essential (primary) hypertension: Secondary | ICD-10-CM | POA: Diagnosis not present

## 2016-03-03 DIAGNOSIS — Z792 Long term (current) use of antibiotics: Secondary | ICD-10-CM | POA: Diagnosis not present

## 2016-03-03 DIAGNOSIS — Z79899 Other long term (current) drug therapy: Secondary | ICD-10-CM | POA: Insufficient documentation

## 2016-03-03 DIAGNOSIS — F419 Anxiety disorder, unspecified: Secondary | ICD-10-CM | POA: Insufficient documentation

## 2016-03-03 DIAGNOSIS — R1032 Left lower quadrant pain: Secondary | ICD-10-CM | POA: Insufficient documentation

## 2016-03-03 DIAGNOSIS — Z87891 Personal history of nicotine dependence: Secondary | ICD-10-CM | POA: Insufficient documentation

## 2016-03-03 DIAGNOSIS — Z87440 Personal history of urinary (tract) infections: Secondary | ICD-10-CM | POA: Insufficient documentation

## 2016-03-03 DIAGNOSIS — R059 Cough, unspecified: Secondary | ICD-10-CM

## 2016-03-03 LAB — COMPREHENSIVE METABOLIC PANEL
ALBUMIN: 4.3 g/dL (ref 3.5–5.0)
ALK PHOS: 54 U/L (ref 38–126)
ALT: 29 U/L (ref 14–54)
ANION GAP: 10 (ref 5–15)
AST: 23 U/L (ref 15–41)
BUN: 8 mg/dL (ref 6–20)
CALCIUM: 9.4 mg/dL (ref 8.9–10.3)
CO2: 21 mmol/L — ABNORMAL LOW (ref 22–32)
CREATININE: 0.59 mg/dL (ref 0.44–1.00)
Chloride: 109 mmol/L (ref 101–111)
GFR calc non Af Amer: >60 mL/min (ref >60)
Glucose, Bld: 92 mg/dL (ref 65–99)
Potassium: 4 mmol/L (ref 3.5–5.1)
SODIUM: 140 mmol/L (ref 135–145)
TOTAL PROTEIN: 6.9 g/dL (ref 6.5–8.1)
Total Bilirubin: 0.6 mg/dL (ref 0.3–1.2)

## 2016-03-03 LAB — URINALYSIS, ROUTINE W REFLEX MICROSCOPIC
BILIRUBIN URINE: NEGATIVE
Glucose, UA: NEGATIVE mg/dL
Hgb urine dipstick: NEGATIVE
KETONES UR: NEGATIVE mg/dL
LEUKOCYTES UA: NEGATIVE
NITRITE: NEGATIVE
PROTEIN: NEGATIVE mg/dL
Specific Gravity, Urine: 1.016 (ref 1.005–1.030)
pH: 5.5 (ref 5.0–8.0)

## 2016-03-03 LAB — CBC
HCT: 41.6 % (ref 36.0–46.0)
Hemoglobin: 14.2 g/dL (ref 12.0–15.0)
MCH: 31.2 pg (ref 26.0–34.0)
MCHC: 34.1 g/dL (ref 30.0–36.0)
MCV: 91.4 fL (ref 78.0–100.0)
PLATELETS: 279 10*3/uL (ref 150–400)
RBC: 4.55 MIL/uL (ref 3.87–5.11)
RDW: 14.4 % (ref 11.5–15.5)
WBC: 7.5 10*3/uL (ref 4.0–10.5)

## 2016-03-03 LAB — LIPASE, BLOOD: LIPASE: 21 U/L (ref 11–51)

## 2016-03-03 MED ORDER — FAMOTIDINE 20 MG PO TABS
20.0000 mg | ORAL_TABLET | Freq: Two times a day (BID) | ORAL | Status: DC
Start: 2016-03-03 — End: 2017-08-11

## 2016-03-03 MED ORDER — ONDANSETRON 4 MG PO TBDP: ORAL_TABLET | ORAL | Status: DC

## 2016-03-03 NOTE — ED Notes (Signed)
Mid upper abd pain with diarrhea since yesterday. C/o nausea, no vomiting.

## 2016-03-03 NOTE — Discharge Instructions (Signed)
Cough, Adult °Coughing is a reflex that clears your throat and your airways. Coughing helps to heal and protect your lungs. It is normal to cough occasionally, but a cough that happens with other symptoms or lasts a long time may be a sign of a condition that needs treatment. A cough may last only 2-3 weeks (acute), or it may last longer than 8 weeks (chronic). °CAUSES °Coughing is commonly caused by: °· Breathing in substances that irritate your lungs. °· A viral or bacterial respiratory infection. °· Allergies. °· Asthma. °· Postnasal drip. °· Smoking. °· Acid backing up from the stomach into the esophagus (gastroesophageal reflux). °· Certain medicines. °· Chronic lung problems, including COPD (or rarely, lung cancer). °· Other medical conditions such as heart failure. °HOME CARE INSTRUCTIONS  °Pay attention to any changes in your symptoms. Take these actions to help with your discomfort: °· Take medicines only as told by your health care provider. °· If you were prescribed an antibiotic medicine, take it as told by your health care provider. Do not stop taking the antibiotic even if you start to feel better. °· Talk with your health care provider before you take a cough suppressant medicine. °· Drink enough fluid to keep your urine clear or pale yellow. °· If the air is dry, use a cold steam vaporizer or humidifier in your bedroom or your home to help loosen secretions. °· Avoid anything that causes you to cough at work or at home. °· If your cough is worse at night, try sleeping in a semi-upright position. °· Avoid cigarette smoke. If you smoke, quit smoking. If you need help quitting, ask your health care provider. °· Avoid caffeine. °· Avoid alcohol. °· Rest as needed. °SEEK MEDICAL CARE IF:  °· You have new symptoms. °· You cough up pus. °· Your cough does not get better after 2-3 weeks, or your cough gets worse. °· You cannot control your cough with suppressant medicines and you are losing sleep. °· You  develop pain that is getting worse or pain that is not controlled with pain medicines. °· You have a fever. °· You have unexplained weight loss. °· You have night sweats. °SEEK IMMEDIATE MEDICAL CARE IF: °· You cough up blood. °· You have difficulty breathing. °· Your heartbeat is very fast. °  °This information is not intended to replace advice given to you by your health care provider. Make sure you discuss any questions you have with your health care provider. °  °Document Released: 05/20/2011 Document Revised: 08/12/2015 Document Reviewed: 01/28/2015 °Elsevier Interactive Patient Education ©2016 [employer] Inc[employer] ° °Abdominal Pain, Adult °Many things can cause abdominal pain. Usually, abdominal pain is not caused by a disease and will improve without treatment. It can often be observed and treated at home. Your health care provider will do a physical exam and possibly order blood tests and X-rays to help determine the seriousness of your pain. However, in many cases, more time must pass before a clear cause of the pain can be found. Before that point, your health care provider may not know if you need more testing or further treatment. °HOME CARE INSTRUCTIONS °Monitor your abdominal pain for any changes. The following actions may help to alleviate any discomfort you are experiencing: °· Only take over-the-counter or prescription medicines as directed by your health care provider. °· Do not take laxatives unless directed to do so by your health care provider. °· Try a clear liquid diet (broth, tea, or water) as directed   by your health care provider. Slowly move to a bland diet as tolerated. °SEEK MEDICAL CARE IF: °· You have unexplained abdominal pain. °· You have abdominal pain associated with nausea or diarrhea. °· You have pain when you urinate or have a bowel movement. °· You experience abdominal pain that wakes you in the night. °· You have abdominal pain that is worsened or improved by eating food. °· You have  abdominal pain that is worsened with eating fatty foods. °· You have a fever. °SEEK IMMEDIATE MEDICAL CARE IF: °· Your pain does not go away within 2 hours. °· You keep throwing up (vomiting). °· Your pain is felt only in portions of the abdomen, such as the right side or the left lower portion of the abdomen. °· You pass bloody or black tarry stools. °MAKE SURE YOU: °· Understand these instructions. °· Will watch your condition. °· Will get help right away if you are not doing well or get worse. °  °This information is not intended to replace advice given to you by your health care provider. Make sure you discuss any questions you have with your health care provider. °  °Document Released: 08/31/2005 Document Revised: 08/12/2015 Document Reviewed: 07/31/2013 °Elsevier Interactive Patient Education ©2016 Elsevier Inc. ° °

## 2016-03-03 NOTE — ED Provider Notes (Signed)
CSN: [id number]     Arrival date & time 03/03/16  1331 History  By signing my name below, I, Tanda Rockers, attest that this documentation has been prepared under the direction and in the presence of Felicie Morn, NP. Electronically Signed: Tanda Rockers, ED Scribe. 03/03/2016. 3:35 PM.   Chief Complaint  Patient presents with  . Abdominal Pain   Patient is a 35 y.o. female presenting with abdominal pain. The history is provided by the patient. No language interpreter was used.  Abdominal Pain Pain location:  Epigastric and LLQ Pain quality: cramping   Pain radiates to:  Does not radiate Pain severity:  Moderate Onset quality:  Gradual Duration: this morning. Timing:  Constant Progression:  Unchanged Chronicity:  New Ineffective treatments:  Antacids Associated symptoms: chest pain (resolved), cough, diarrhea and nausea   Associated symptoms: no fever, no shortness of breath and no vomiting      HPI Comments: [name] is a 35 y.o. female who presents to the Emergency Department complaining of gradual onset, constant, cramping, LLQ abdominal pain that began this morning. Pt mentions having sharp chest pain yesterday that has since resolved on its own. She attributed the chest pain to indigestion. She also complains of nausea, diarrhea, and a productive cough with green phlegm. Pt states that she has been having issues with GERD as of late every times she eats and scheduled an appointment with a GI for 05/16 (1.5 months from now). She has been taking Rolaids without relief. Denies vomiting, shortness of breath, or any other associated symptoms. No hx IBS.   Past Medical History  Diagnosis Date  . Ectopic pregnancy   . Urinary tract infection   . Ovarian cyst   . Infection     UTI  . Back pain, chronic   . Depression     hx of   . Headache     occasional   . Dysrhythmia 2013    hx of a-fib  . Hypertension     was told, but never on meds  . Anxiety    Past Surgical  History  Procedure Laterality Date  . Etopical surgery    . Cleft palate repair    . Laparoscopy for ectopic pregnancy  2011  . Ventral hernia repair N/A 03/26/2015    Procedure: LAPAROSCOPIC REPAIR OF INCARCERATED SUPERUMBILICAL HERNIA X2 AND UMBILICAL HERNIA REPAIR WITH MESH;  Surgeon: Karie Soda, MD;  Location: [facility];  Service: General;  Laterality: N/A;  . Tympanoplasty Right 08/03/2015    Procedure: RIGHT MEDIAL TECHNIQUE TYMPANOPLASTY;  Surgeon: Flo Shanks, MD;  Location: [facility];  Service: ENT;  Laterality: Right;   Family History  Problem Relation Age of Onset  . Other Neg Hx   . Hearing loss Neg Hx   . Hypertension Mother   . Hypertension Father   . Cancer Sister   . Heart disease Maternal Grandmother   . Cancer Maternal Grandfather    Social History  Substance Use Topics  . Smoking status: Former Smoker -- 0.25 packs/day for 15 years    Types: Cigarettes    Quit date: 10/02/2014  . Smokeless tobacco: Never Used  . Alcohol Use: 0.6 oz/week    1 Standard drinks or equivalent per week     Comment: social   OB History    Gravida Para Term Preterm AB TAB SAB Ectopic Multiple Living   2    2 0 1 1 0 0     Review of  Systems  Constitutional: Negative for fever.  Respiratory: Positive for cough. Negative for shortness of breath.   Cardiovascular: Positive for chest pain (resolved).  Gastrointestinal: Positive for nausea, abdominal pain and diarrhea. Negative for vomiting.  All other systems reviewed and are negative.  Allergies  Flexeril  Home Medications   Prior to Admission medications   Medication Sig Start Date End Date Taking? Authorizing Provider  albuterol (PROVENTIL HFA;VENTOLIN HFA) 108 (90 Base) MCG/ACT inhaler Inhale 2 puffs into the lungs every 6 (six) hours as needed for wheezing or shortness of breath (cough, shortness of breath or wheezing.). 12/28/15   Thao P Le, DO  amoxicillin-clavulanate (AUGMENTIN) 875-125 MG tablet Take 1  tablet by mouth 2 (two) times daily. One po bid x 10 days 12/28/15   Thao P Le, DO  benzonatate (TESSALON) 100 MG capsule Take 1 capsule (100 mg total) by mouth 3 (three) times daily as needed for cough. 01/16/16   Shade FloodJeffrey R Greene, MD  fluticasone (FLONASE) 50 MCG/ACT nasal spray Place 1 spray into both nostrils daily. 12/28/15   Thao P Le, DO  HYDROcodone-homatropine (HYCODAN) 5-1.5 MG/5ML syrup Take 5 mLs by mouth every 8 (eight) hours as needed for cough. 01/16/16   Shade FloodJeffrey R Greene, MD  hydrOXYzine (VISTARIL) 25 MG capsule Take 1 capsule (25 mg total) by mouth every 8 (eight) hours as needed for anxiety. 12/28/15   Thao P Le, DO  sertraline (ZOLOFT) 25 MG tablet Take 1 tablet (25 mg total) by mouth daily. 12/28/15   Thao P Le, DO   BP 132/89 mmHg  Pulse 92  Temp(Src) 97.8 F (36.6 C) (Oral)  Resp 18  Ht 5\' 1"  (1.549 m)  Wt 135 lb (61.236 kg)  BMI 25.52 kg/m2  SpO2 96%   Physical Exam  Constitutional: She is oriented to person, place, and time. She appears well-developed and well-nourished. No distress.  HENT:  Head: Normocephalic and atraumatic.  Eyes: Conjunctivae and EOM are normal.  Neck: Neck supple. No tracheal deviation present.  Cardiovascular: Normal rate and regular rhythm.   Pulmonary/Chest: Effort normal. No respiratory distress.  Abdominal: Soft. There is tenderness (mild) in the left lower quadrant.  Musculoskeletal: Normal range of motion.  Neurological: She is alert and oriented to person, place, and time.  Skin: Skin is warm and dry.  Psychiatric: She has a normal mood and affect. Her behavior is normal.  Nursing note and vitals reviewed.   ED Course  Procedures (including critical care time)  DIAGNOSTIC STUDIES: Oxygen Saturation is 96% on RA, normal by my interpretation.    COORDINATION OF CARE: 3:31 PM-Discussed treatment plan with pt at bedside and pt agreed to plan.   Labs Review Labs Reviewed  COMPREHENSIVE METABOLIC PANEL - Abnormal; Notable for the  following:    CO2 21 (*)    All other components within normal limits  LIPASE, BLOOD  CBC  URINALYSIS, ROUTINE W REFLEX MICROSCOPIC (NOT AT [facility])    Imaging Review No results found. I have personally reviewed and evaluated these lab results as part of my medical decision-making.   EKG Interpretation None      MDM   Final diagnoses:  None  Abdominal pain. Cough. No concerning findings on exam. Symptomatic care instructions provided. Return precautions discussed. Follow-up with gastroenterology as scheduled.  I personally performed the services described in this documentation, which was scribed in my presence. The recorded information has been reviewed and is accurate.      Felicie Mornavid Lyann Hagstrom, NP 03/03/16 1914  Gwyneth Sprout, MD 03/04/16 [phone]

## 2016-03-08 ENCOUNTER — Ambulatory Visit (INDEPENDENT_AMBULATORY_CARE_PROVIDER_SITE_OTHER): Payer: BLUE CROSS/BLUE SHIELD | Admitting: Family Medicine

## 2016-03-08 VITALS — BP 134/87 | HR 85 | Temp 98.3°F | Resp 16 | Ht 61.0 in | Wt 142.0 lb

## 2016-03-08 DIAGNOSIS — R05 Cough: Secondary | ICD-10-CM | POA: Diagnosis not present

## 2016-03-08 DIAGNOSIS — R059 Cough, unspecified: Secondary | ICD-10-CM

## 2016-03-08 DIAGNOSIS — F411 Generalized anxiety disorder: Secondary | ICD-10-CM | POA: Diagnosis not present

## 2016-03-08 DIAGNOSIS — G4700 Insomnia, unspecified: Secondary | ICD-10-CM

## 2016-03-08 DIAGNOSIS — M79671 Pain in right foot: Secondary | ICD-10-CM | POA: Diagnosis not present

## 2016-03-08 NOTE — Patient Instructions (Addendum)
Please try to decrease your tylenol PM to 1/2 dose or every other day  Start your pepcid twice a day  Try generic Zyrtec daily for allergies  For evaluation of mood disorder- [facility]- [phone] [facility]- [phone] [phone] [facility]- [phone]    IF you received an x-ray today, you will receive an invoice from [facility]. Please contact [facility] at [phone] with questions or concerns regarding your invoice.   IF you received labwork today, you will receive an invoice from [facility]. Please contact [facility] at [phone] with questions or concerns regarding your invoice.   Our billing staff will not be able to assist you with questions regarding bills from these companies.  You will be contacted with the lab results as soon as they are available. The fastest way to get your results is to activate your My Chart account. Instructions are located on the last page of this paperwork. If you have not heard from us regarding the results in 2 weeks, please contact this office.

## 2016-03-08 NOTE — Progress Notes (Signed)
Subjective:    Patient ID: [name], female    DOB: 10/03/1981, 35 y.o.   MRN: [id number]  HPI This is a 35 yo female who presents today for follow up of anxiety, cough.   Has started dating an old friend. They are interested in having a child but her [facility] shows that she is going through menopause. It was recommended that she consider adoption. She is going to see infertility specialist.   She has been having continued cough. Nonproductive. Some allergy symptoms- runny nose, post nasal drainage. Smoking 1 cigarette a day. Coughs with talking. Still using flonase, albuterol.   Continued acid reflux symptoms, no relief with Rolaids. Comfort eating- chips, candy. Has tried pepcid intermittently.   Right foot pain, started yesterday, no known injury.  She was given names of providers for suspected mood disorder and she did not follow through. She admits that her wildly fluctuating mood is problematic. She is taking tylenol PM nightly with some improvement of sleep.    Past Medical History  Diagnosis Date  . Ectopic pregnancy   . Urinary tract infection   . Ovarian cyst   . Infection     UTI  . Back pain, chronic   . Depression     hx of   . Headache     occasional   . Dysrhythmia 2013    hx of a-fib  . Hypertension     was told, but never on meds  . Anxiety    Past Surgical History  Procedure Laterality Date  . Etopical surgery    . Cleft palate repair    . Laparoscopy for ectopic pregnancy  2011  . Ventral hernia repair N/A 03/26/2015    Procedure: LAPAROSCOPIC REPAIR OF INCARCERATED SUPERUMBILICAL HERNIA X2 AND UMBILICAL HERNIA REPAIR WITH MESH;  Surgeon: Karie Soda, MD;  Location: [facility];  Service: General;  Laterality: N/A;  . Tympanoplasty Right 08/03/2015    Procedure: RIGHT MEDIAL TECHNIQUE TYMPANOPLASTY;  Surgeon: Flo Shanks, MD;  Location: [facility];  Service: ENT;  Laterality: Right;   Family History  Problem Relation Age of Onset    . Other Neg Hx   . Hearing loss Neg Hx   . Hypertension Mother   . Hypertension Father   . Cancer Sister   . Heart disease Maternal Grandmother   . Cancer Maternal Grandfather    Social History  Substance Use Topics  . Smoking status: Former Smoker -- 0.25 packs/day for 15 years    Types: Cigarettes    Quit date: 10/02/2014  . Smokeless tobacco: Never Used  . Alcohol Use: 0.6 oz/week    1 Standard drinks or equivalent per week     Comment: social      Review of Systems  Constitutional: Negative for fever, chills and fatigue.  Respiratory: Positive for cough (non productive). Negative for shortness of breath and wheezing.   Cardiovascular: Negative for chest pain.  Gastrointestinal: Positive for abdominal pain (frequent acid indigestion). Negative for nausea, vomiting, diarrhea and constipation.  Psychiatric/Behavioral: Positive for sleep disturbance. The patient is hyperactive.        Objective:   Physical Exam  Constitutional: She is oriented to person, place, and time. She appears well-developed and well-nourished.  HENT:  Head: Normocephalic and atraumatic.  Cardiovascular: Normal rate, regular rhythm and normal heart sounds.   Pulmonary/Chest: Effort normal and breath sounds normal.  Abdominal: Soft. Bowel sounds are normal. She exhibits no distension. There is no tenderness. There  is no rebound and no guarding.  Musculoskeletal: Normal range of motion.  Right foot with normal ROM, no edema, no erythema, no TTP.   Neurological: She is alert and oriented to person, place, and time.  Skin: Skin is warm and dry.  Psychiatric: She is hyperactive.  Rapid speech  Vitals reviewed.     BP 134/87 mmHg  Pulse 85  Temp(Src) 98.3 F (36.8 C)  Resp 16  Ht 5\' 1"  (1.549 m)  Wt 142 lb (64.411 kg)  BMI 26.84 kg/m2 Wt Readings from Last 3 Encounters:  03/08/16 142 lb (64.411 kg)  03/03/16 135 lb (61.236 kg)  01/16/16 133 lb 3.2 oz (60.419 kg)       Assessment &  Plan:  1. Cough - post viral vs. Allergic rhinitis- zyrtec daily, continue fonase and albuterol prn. Encouraged complete smoking cessation.    2. Generalized anxiety disorder - discussed with patient along with her history of bipolar and need for appropriate treatment, especially if she is considering trying to become pregnant/adopting.   3. Right foot pain - normal exam, OTC analgesics, ice/heat, supportive shoe  4. Insomnia - encouraged decreased dependence on tylenol PM and improved sleep hygiene.   5. GERD - try Pepcid twice a day for at least 14 days  - follow up in 6 months for CPE   Olean Reeeborah Gessner, FNP-BC  [facility]  03/12/2016 7:56 AM

## 2016-03-12 ENCOUNTER — Encounter: Payer: Self-pay | Admitting: Family Medicine

## 2016-03-19 IMAGING — CR DG ABDOMEN ACUTE W/ 1V CHEST
3 series · 3 of 3 positions shown · non-contrast
Comparison: Chest radiograph performed 10/23/2014, and abdominal
radiograph from 05/02/2014

CLINICAL DATA: Acute onset of generalized abdominal pain, nausea
and vomiting. Initial encounter.

EXAM:
DG ABDOMEN ACUTE W/ 1V CHEST

[w chest pa]
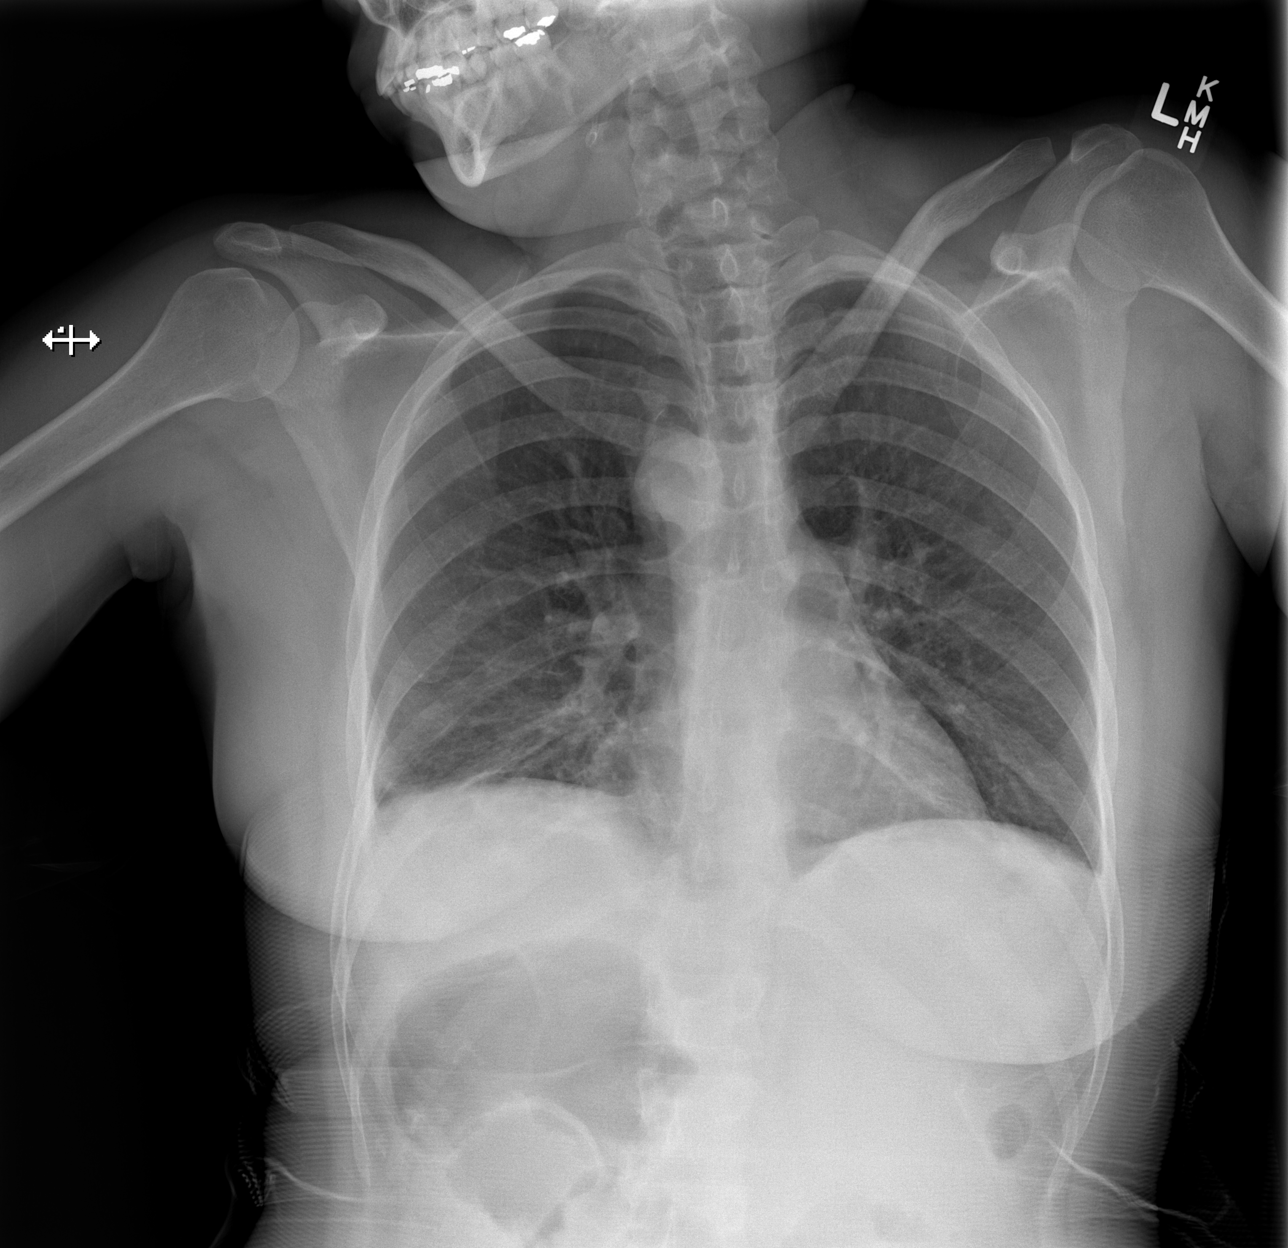

[w abdomen upright]
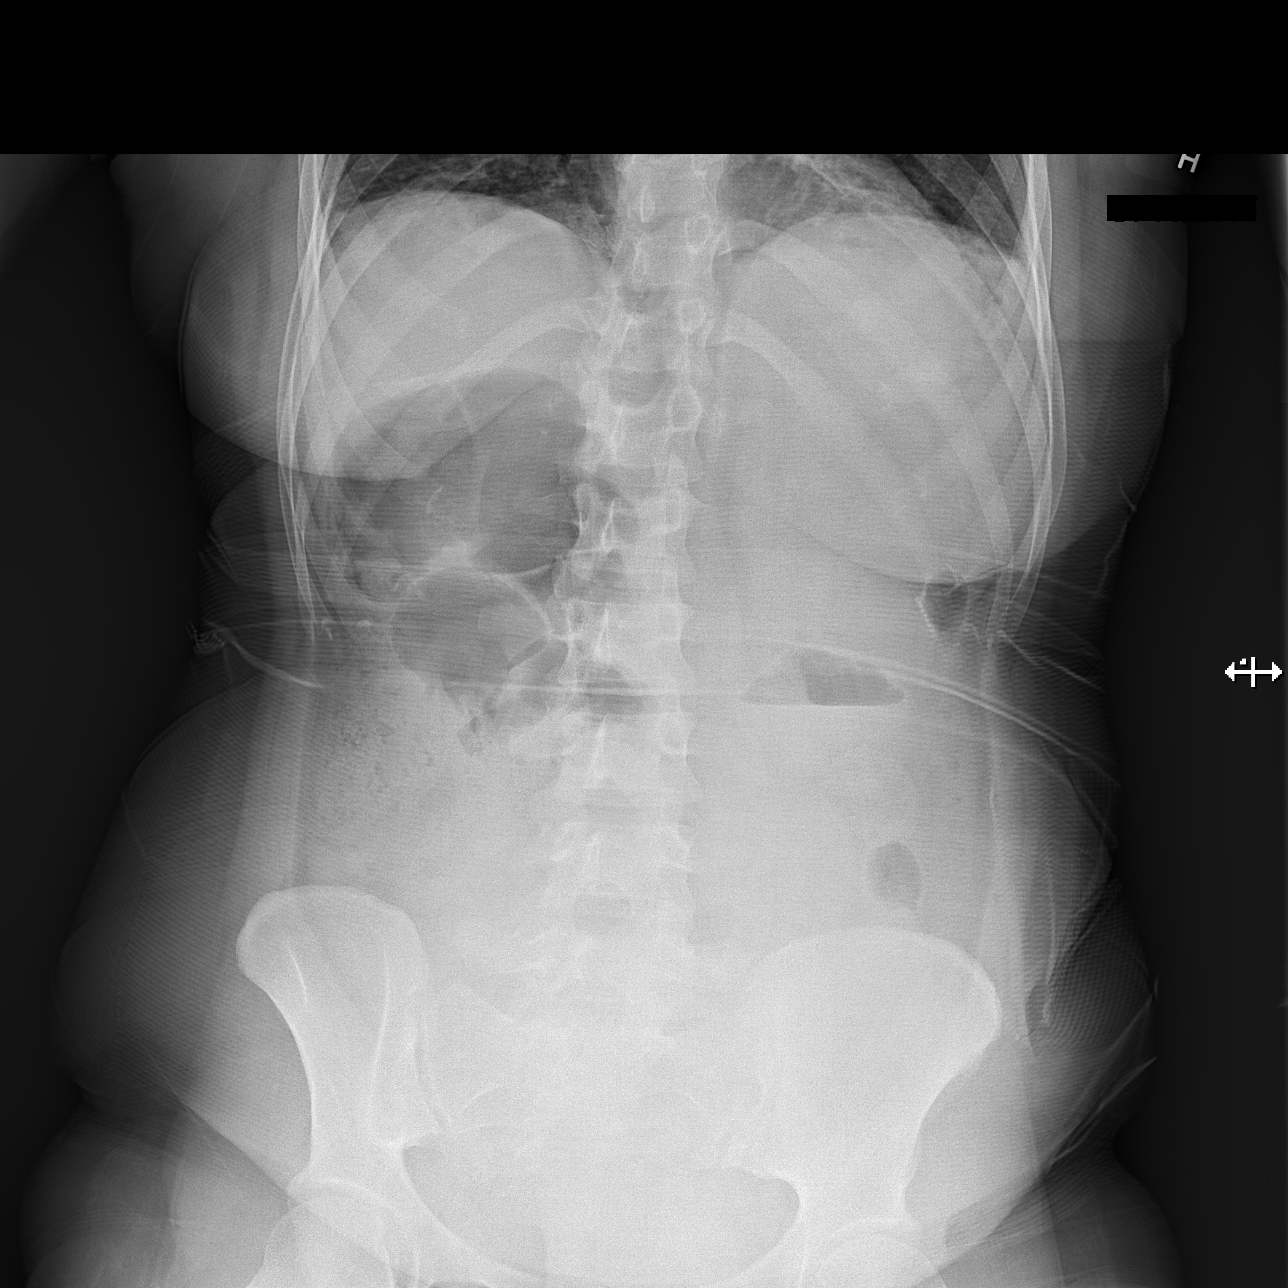

[t abdomen supine]
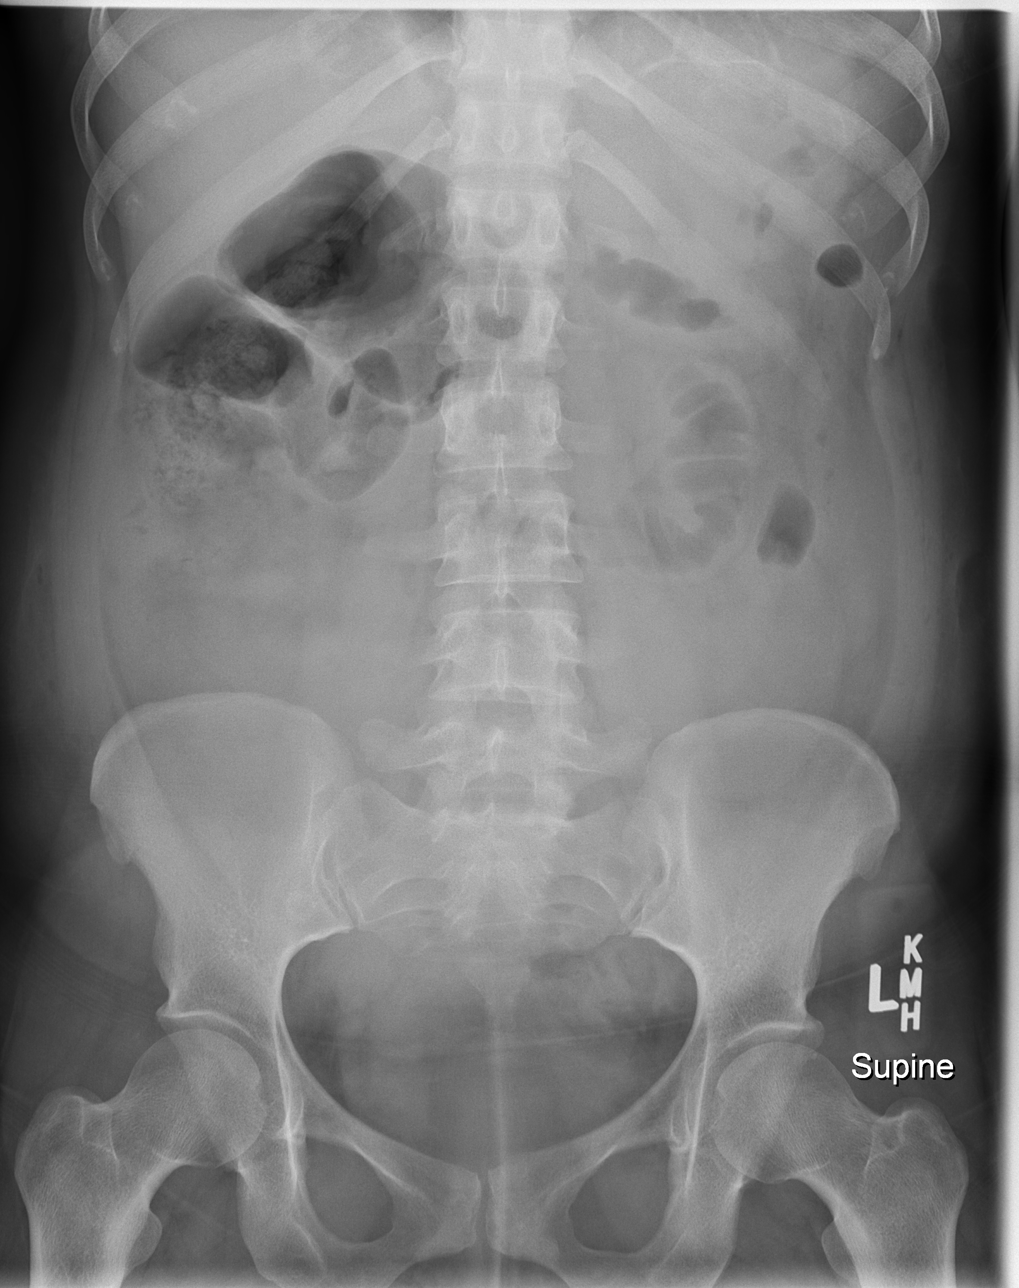

[3 of 3 positions shown; findings below may reference images not displayed]

FINDINGS: The lungs are well-aerated. Minimal bilateral atelectasis is noted.
There is no evidence of pleural effusion or pneumothorax. The
cardiomediastinal silhouette is within normal limits.

The visualized bowel gas pattern is nonspecific. Scattered stool and
air are seen within the colon; apparent wall thickening is noted
along a small bowel loop at the left mid abdomen, which may reflect
mild ileitis. No free intra-abdominal air is identified on the
provided upright view.

No acute osseous abnormalities are seen; the sacroiliac joints are
unremarkable in appearance.
IMPRESSION: 1. Apparent wall thickening along a small bowel loop at the left mid
abdomen, which may reflect a mild infectious or inflammatory
ileitis. Otherwise unremarkable bowel gas pattern. No free
intra-abdominal air seen.
2. Minimal bilateral atelectasis noted.

## 2016-04-19 ENCOUNTER — Ambulatory Visit: Payer: Self-pay | Admitting: Internal Medicine

## 2016-05-11 ENCOUNTER — Ambulatory Visit (HOSPITAL_COMMUNITY): Payer: Self-pay | Admitting: Psychiatry

## 2016-08-03 ENCOUNTER — Ambulatory Visit (INDEPENDENT_AMBULATORY_CARE_PROVIDER_SITE_OTHER): Payer: BLUE CROSS/BLUE SHIELD | Admitting: Emergency Medicine

## 2016-08-03 ENCOUNTER — Other Ambulatory Visit: Payer: Self-pay | Admitting: Emergency Medicine

## 2016-08-03 ENCOUNTER — Ambulatory Visit (INDEPENDENT_AMBULATORY_CARE_PROVIDER_SITE_OTHER): Payer: BLUE CROSS/BLUE SHIELD

## 2016-08-03 VITALS — BP 118/80 | HR 85 | Temp 98.0°F | Resp 18 | Ht 61.0 in | Wt 134.0 lb

## 2016-08-03 DIAGNOSIS — M25572 Pain in left ankle and joints of left foot: Secondary | ICD-10-CM

## 2016-08-03 DIAGNOSIS — G8929 Other chronic pain: Secondary | ICD-10-CM

## 2016-08-03 DIAGNOSIS — M79675 Pain in left toe(s): Principal | ICD-10-CM

## 2016-08-03 MED ORDER — MELOXICAM 7.5 MG PO TABS: ORAL_TABLET | ORAL | 0 refills | Status: DC

## 2016-08-03 NOTE — Progress Notes (Signed)
Subjective:  This chart was scribed for Lesle Chris MD, by Veverly Fells, at [facility]  This patient was seen in room 1 and the patient's care was started at 12:14 PM.   Chief Complaint  Patient presents with   Foot Pain    big toe on left foot. x1 month      Patient ID: [name], female    DOB: 05/21/1981, 35 y.o.   MRN: [id number]  HPI HPI Comments: [name] is a 35 y.o. female who presents to the [facility] complaining of left big toe pain onset about a month ago.  Patient saw Deboraha Sprang "a long time ago" who told her to come back if it ever worsened and states that the pain did subside for a while.   She has tried using ice packs while staying off of her foot and use advil but denies any relief. Patient denies any possibility of a pregnancy as she stopped having periods when she was 35 years old. 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    Patient Active Problem List   Diagnosis Date Noted   Ileus (HCC) 03/28/2015   Incarcerated ventral hernia s/p lap repair w mesh 03/25/2014 03/26/2015   Umbilical hernia s/p lap repair w mesh 03/25/2014 03/26/2015   Bipolar disorder, unspecified (HCC) 09/01/2014   Back muscle spasm 09/01/2014   Smoking 09/01/2014   Elevated BP 09/01/2014   Atrial fibrillation, new 01/13/2013   Nausea and vomiting in adult 01/13/2013   Hypertension 01/13/2013   Past Medical History:  Diagnosis Date   Anxiety    Back pain, chronic    Depression    hx of    Dysrhythmia 2013   hx of a-fib   Ectopic pregnancy    GERD (gastroesophageal reflux disease)    Headache    occasional    Hypertension    was told, but never on meds   Infection    UTI   Ovarian cyst    Urinary tract infection    Past Surgical History:  Procedure Laterality Date   CLEFT PALATE REPAIR     etopical surgery     LAPAROSCOPY FOR ECTOPIC PREGNANCY  2011   TYMPANOPLASTY  Right 08/03/2015   Procedure: RIGHT MEDIAL TECHNIQUE TYMPANOPLASTY;  Surgeon: Flo Shanks, MD;  Location: [facility];  Service: ENT;  Laterality: Right;   VENTRAL HERNIA REPAIR N/A 03/26/2015   Procedure: LAPAROSCOPIC REPAIR OF INCARCERATED SUPERUMBILICAL HERNIA X2 AND UMBILICAL HERNIA REPAIR WITH MESH;  Surgeon: Karie Soda, MD;  Location: [facility];  Service: General;  Laterality: N/A;   Allergies  Allergen Reactions   Flexeril [Cyclobenzaprine] Hives   Prior to Admission medications   Medication Sig Start Date End Date Taking? Authorizing Provider  albuterol (PROVENTIL HFA;VENTOLIN HFA) 108 (90 Base) MCG/ACT inhaler Inhale 2 puffs into the lungs every 6 (six) hours as needed for wheezing or shortness of breath (cough, shortness of breath or wheezing.). 12/28/15  Yes Thao P Le, DO  fluticasone (FLONASE) 50 MCG/ACT nasal spray Place 1 spray into both nostrils daily. 12/28/15  Yes Thao P Le, DO  olopatadine (PATANOL) 0.1 % ophthalmic solution  02/25/16  Yes Historical Provider, MD  famotidine (PEPCID) 20 MG tablet Take 1 tablet (20 mg total) by mouth 2 (two) times daily. Patient not taking: Reported on 08/03/2016 03/03/16   Felicie Morn, NP  hydrOXYzine (VISTARIL) 25 MG capsule Take 1 capsule (25 mg  total) by mouth every 8 (eight) hours as needed for anxiety. Patient not taking: Reported on 03/08/2016 12/28/15   Thao P Le, DO  ondansetron (ZOFRAN ODT) 4 MG disintegrating tablet 4mg  ODT q6 hours prn nausea/vomit Patient not taking: Reported on 08/03/2016 03/03/16   Felicie Mornavid Smith, NP  sertraline (ZOLOFT) 25 MG tablet Take 1 tablet (25 mg total) by mouth daily. Patient not taking: Reported on 08/03/2016 12/28/15   Lenell Antuhao P Le, DO   Social History   Social History   Marital status: Single    Spouse name: N/A   Number of children: N/A   Years of education: N/A   Occupational History   Not on file.   Social History Main Topics   Smoking status: Light Tobacco Smoker    Packs/day: 0.25     Years: 15.00    Types: Cigarettes   Smokeless tobacco: Never Used   Alcohol use 0.6 oz/week    1 Standard drinks or equivalent per week     Comment: social   Drug use: No   Sexual activity: Yes    Partners: Male    Birth control/ protection: None   Other Topics Concern   Not on file   Social History Narrative   No narrative on file      Review of Systems  Constitutional: Negative for chills and fever.  Eyes: Negative for pain, redness and itching.  Respiratory: Negative for cough and shortness of breath.   Gastrointestinal: Negative for nausea and vomiting.  Musculoskeletal: Positive for gait problem. Negative for neck pain and neck stiffness.  Skin: Negative for color change.  Neurological: Negative for syncope and speech difficulty.       Objective:   Physical Exam Vitals:   08/03/16 1139  BP: 118/80  Pulse: 85  Resp: 18  Temp: 98 F (36.7 C)  TempSrc: Oral  SpO2: 97%  Weight: 134 lb (60.8 kg)  Height: 5\' 1"  (1.549 m)     CONSTITUTIONAL: Well developed/well nourished HEAD: Normocephalic/atraumatic EYES: EOMI/PERRL LUNGS: Lungs are clear to auscultation bilaterally, no apparent distress NEURO: Pt is awake/alert/appropriate, moves all extremitiesx4.  No facial droop.   EXTREMITIES:  Tender over the first left MTP joint with flexion and extension and mild tenderness on the dorsum of the foot.  SKIN: warm, color normal PSYCH: no abnormalities of mood noted, alert and oriented to situation  Dg Foot Complete Left  Result Date: 08/03/2016 CLINICAL DATA:  Pain medially EXAM: LEFT FOOT - COMPLETE 3+ VIEW COMPARISON:  None. FINDINGS: Frontal, oblique, and lateral views were obtained. There is no fracture or dislocation. The joint spaces are normal. No erosive change. IMPRESSION: No fracture or dislocation.  No apparent arthropathy. Electronically Signed   By: Bretta BangWilliam  Woodruff III M.D.   On: 08/03/2016 12:37       Assessment & Plan:  Patient has pain  in the left first metatarsal. X-rays do not show any signs of arthritis. It does not appear to be gout. We'll try an anti-inflammatory with a metatarsal plate to keep pressure off of the base of the first toe. If she continues to have problems we'll make a referral to the podiatrist  Collene GobbleSteven A Daub, MD

## 2016-08-03 NOTE — Patient Instructions (Addendum)
Please purchase a metatarsal pad at the drugstore to used on your foot. I have given you an anti-inflammatory to take for pain and inflammation. Please wear comfortable shoes with good support.    IF you received an x-ray today, you will receive an invoice from [facility]. Please contact [facility] at [phone] with questions or concerns regarding your invoice.   IF you received labwork today, you will receive an invoice from [facility]. Please contact [facility] at [phone] with questions or concerns regarding your invoice.   Our billing staff will not be able to assist you with questions regarding bills from these companies.  You will be contacted with the lab results as soon as they are available. The fastest way to get your results is to activate your My Chart account. Instructions are located on the last page of this paperwork. If you have not heard from us regarding the results in 2 weeks, please contact this office.

## 2017-06-05 ENCOUNTER — Emergency Department (HOSPITAL_COMMUNITY): Payer: Self-pay

## 2017-06-05 ENCOUNTER — Emergency Department (HOSPITAL_COMMUNITY)
Admission: EM | Admit: 2017-06-05 | Discharge: 2017-06-05 | Disposition: A | Discharge status: Home / Self Care | Payer: Self-pay | Attending: Emergency Medicine | Admitting: Emergency Medicine

## 2017-06-05 ENCOUNTER — Encounter (HOSPITAL_COMMUNITY): Payer: Self-pay | Admitting: Emergency Medicine

## 2017-06-05 DIAGNOSIS — I10 Essential (primary) hypertension: Secondary | ICD-10-CM | POA: Insufficient documentation

## 2017-06-05 DIAGNOSIS — M545 Low back pain, unspecified: Secondary | ICD-10-CM

## 2017-06-05 DIAGNOSIS — F17210 Nicotine dependence, cigarettes, uncomplicated: Secondary | ICD-10-CM | POA: Insufficient documentation

## 2017-06-05 DIAGNOSIS — Y93E5 Activity, floor mopping and cleaning: Secondary | ICD-10-CM | POA: Insufficient documentation

## 2017-06-05 DIAGNOSIS — Z79899 Other long term (current) drug therapy: Secondary | ICD-10-CM | POA: Insufficient documentation

## 2017-06-05 DIAGNOSIS — W010XXA Fall on same level from slipping, tripping and stumbling without subsequent striking against object, initial encounter: Secondary | ICD-10-CM | POA: Insufficient documentation

## 2017-06-05 MED ORDER — ORPHENADRINE CITRATE ER 100 MG PO TB12: 100.0000 mg | ORAL_TABLET | Freq: Two times a day (BID) | ORAL | 0 refills | Status: DC

## 2017-06-05 MED ORDER — ACETAMINOPHEN 500 MG PO TABS: 500.0000 mg | ORAL_TABLET | Freq: Four times a day (QID) | ORAL | 0 refills | Status: DC | PRN

## 2017-06-05 MED ORDER — IBUPROFEN 800 MG PO TABS: 800.0000 mg | ORAL_TABLET | Freq: Three times a day (TID) | ORAL | 0 refills | Status: DC

## 2017-06-05 NOTE — ED Triage Notes (Signed)
Patient states that yesterday morning she was mopping kitchen floor and slipped and fell on her back./ patient c/o lower back pain that is worse with movement.

## 2017-06-05 NOTE — Discharge Instructions (Signed)
Medications: Norflex, ibuprofen, Tylenol  Treatment: Take Norflex twice daily as needed for muscle pain and spasms. Do not drive or operate machinery when taking this medication. Take ibuprofen 3 times daily as needed for your pain. You can alternate with Tylenol as prescribed in between taking ibuprofen. Use heat 3-4 times daily alternating 20 minutes on, 20 minutes off. Attempt the exercises and stretches 1-2 times daily as tolerated.  Follow-up: Please follow-up with your primary care provider if your symptoms are not improving. Please return to the emergency department if he develop any new or worsening symptoms including complete numbness of your groin area , loss of bowel or bladder control, or inability to walk.

## 2017-06-05 NOTE — ED Provider Notes (Signed)
[facility]EMERGENCY DEPT Provider Note   CSN: [id number] Arrival date & time: 06/05/17  1015   By signing my name below, I, [name], attest that this documentation has been prepared under the direction and in the presence of Breanna Ream, PA-C Electronically Signed: Soijett Graves, ED Scribe. 06/05/17. 11:38 AM.  History   Chief Complaint Chief Complaint  Patient presents with  . Fall  . Back Pain    HPI [name] is a 36 y.o. female with a PMHx of chronic back pain, HTN, who presents to the Emergency Department complaining of a lower back pain s/p fall onset yesterday morning. Pt reports associated tingling sensation in left leg and hitting the back of her head. Pt has tried ice and 2 doses of 800 mg ibuprofen with mild relief of her symptoms. She notes that she was mopping when she slipped and fell onto her right hip and lower back. Pt lower back pain is exacerbated with bending over with no alleviating factors. She denies HA, LOC, fever, bowel/bladder incontinence, numbness, neck pain, nausea, vomiting, recent back surgeries, recent back injections, and any other symptoms. Pt reports that her LMP was when she was 36 years old and she denies pregnancy at this time.     The history is provided by the patient. No language interpreter was used.    Past Medical History:  Diagnosis Date  . Anxiety   . Back pain, chronic   . Depression    hx of   . Dysrhythmia 2013   hx of a-fib  . Ectopic pregnancy   . GERD (gastroesophageal reflux disease)   . Headache    occasional   . Hypertension    was told, but never on meds  . Infection    UTI  . Ovarian cyst   . Urinary tract infection     Patient Active Problem List   Diagnosis Date Noted  . Ileus (HCC) 03/28/2015  . Incarcerated ventral hernia s/p lap repair w mesh 03/25/2014 03/26/2015  . Umbilical hernia s/p lap repair w mesh 03/25/2014 03/26/2015  . Bipolar disorder, unspecified (HCC) 09/01/2014  . Back muscle spasm  09/01/2014  . Smoking 09/01/2014  . Elevated BP 09/01/2014  . Atrial fibrillation, new 01/13/2013  . Nausea and vomiting in adult 01/13/2013  . Hypertension 01/13/2013    Past Surgical History:  Procedure Laterality Date  . CLEFT PALATE REPAIR    . etopical surgery    . LAPAROSCOPY FOR ECTOPIC PREGNANCY  2011  . TYMPANOPLASTY Right 08/03/2015   Procedure: RIGHT MEDIAL TECHNIQUE TYMPANOPLASTY;  Surgeon: Flo Shanks, MD;  Location: [facility];  Service: ENT;  Laterality: Right;  . VENTRAL HERNIA REPAIR N/A 03/26/2015   Procedure: LAPAROSCOPIC REPAIR OF INCARCERATED SUPERUMBILICAL HERNIA X2 AND UMBILICAL HERNIA REPAIR WITH MESH;  Surgeon: Karie Soda, MD;  Location: [facility];  Service: General;  Laterality: N/A;    OB History    Gravida Para Term Preterm AB Living   2       2 0   SAB TAB Ectopic Multiple Live Births   1 0 1 0         Home Medications    Prior to Admission medications   Medication Sig Start Date End Date Taking? Authorizing Provider  acetaminophen (TYLENOL) 500 MG tablet Take 1 tablet (500 mg total) by mouth every 6 (six) hours as needed. 06/05/17   Chena Chohan, Waylan Boga, PA-C  albuterol (PROVENTIL HFA;VENTOLIN HFA) 108 (90 Base) MCG/ACT inhaler Inhale 2  puffs into the lungs every 6 (six) hours as needed for wheezing or shortness of breath (cough, shortness of breath or wheezing.). 12/28/15   Le, Thao P, DO  famotidine (PEPCID) 20 MG tablet Take 1 tablet (20 mg total) by mouth 2 (two) times daily. Patient not taking: Reported on 08/03/2016 03/03/16   Felicie Morn, NP  fluticasone [facility]) 50 MCG/ACT nasal spray Place 1 spray into both nostrils daily. 12/28/15   Le, Thao P, DO  hydrOXYzine (VISTARIL) 25 MG capsule Take 1 capsule (25 mg total) by mouth every 8 (eight) hours as needed for anxiety. Patient not taking: Reported on 03/08/2016 12/28/15   Le, Thao P, DO  ibuprofen (ADVIL,MOTRIN) 800 MG tablet Take 1 tablet (800 mg total) by mouth 3 (three) times daily.  06/05/17   Timothee Gali, Waylan Boga, PA-C  meloxicam (MOBIC) 7.5 MG tablet Take 1-2 tablets daily with food 08/03/16   Collene Gobble, MD  olopatadine (PATANOL) 0.1 % ophthalmic solution  02/25/16   [provider]  ondansetron (ZOFRAN ODT) 4 MG disintegrating tablet 4mg  ODT q6 hours prn nausea/vomit Patient not taking: Reported on 08/03/2016 03/03/16   Felicie Morn, NP  orphenadrine (NORFLEX) 100 MG tablet Take 1 tablet (100 mg total) by mouth 2 (two) times daily. 06/05/17   Sorina Derrig, Waylan Boga, PA-C  sertraline (ZOLOFT) 25 MG tablet Take 1 tablet (25 mg total) by mouth daily. Patient not taking: Reported on 08/03/2016 12/28/15   Lenell Antu, DO    Family History Family History  Problem Relation Age of Onset  . Hypertension Mother   . Hypertension Father   . Cancer Sister   . Heart disease Maternal Grandmother   . Cancer Maternal Grandfather   . Other Neg Hx   . Hearing loss Neg Hx     Social History Social History  Substance Use Topics  . Smoking status: Light Tobacco Smoker    Packs/day: 0.25    Years: 15.00    Types: Cigarettes  . Smokeless tobacco: Never Used  . Alcohol use 0.6 oz/week    1 Standard drinks or equivalent per week     Comment: social     Allergies   Flexeril [cyclobenzaprine]   Review of Systems Review of Systems  Constitutional: Negative for fever.  Gastrointestinal: Negative for nausea and vomiting.       No bowel incontinence.   Genitourinary:       No bladder incontinence.   Musculoskeletal: Positive for back pain (lower). Negative for neck pain.  Neurological: Negative for syncope, numbness and headaches.       +Tingling to right leg     Physical Exam Updated Vital Signs BP 132/83 (BP Location: Left Arm)   Pulse 68   Temp 98 F (36.7 C) (Oral)   Resp 17   SpO2 97%   Physical Exam  Constitutional: She appears well-developed and well-nourished. No distress.  HENT:  Head: Normocephalic and atraumatic.  Mouth/Throat: Oropharynx is clear and  moist. No oropharyngeal exudate.  Eyes: Conjunctivae are normal. Pupils are equal, round, and reactive to light. Right eye exhibits no discharge. Left eye exhibits no discharge. No scleral icterus.  Neck: Normal range of motion. Neck supple. No thyromegaly present.  Cardiovascular: Normal rate, regular rhythm, normal heart sounds and intact distal pulses.  Exam reveals no gallop and no friction rub.   No murmur heard. Pulmonary/Chest: Effort normal and breath sounds normal. No stridor. No respiratory distress. She has no wheezes. She has no rales.  Abdominal:  Soft. Bowel sounds are normal. She exhibits no distension. There is no tenderness. There is no rebound and no guarding.  Musculoskeletal: She exhibits no edema.       Right hip: She exhibits bony tenderness.       Cervical back: Normal.       Thoracic back: Normal.       Lumbar back: She exhibits bony tenderness.  Midline lumbar tenderness and right posterior hip tenderness. No midline cervical or thoracic tenderness. 5/5 strength and nl sensation to lower extremities. 2+ patellar reflexes.   Lymphadenopathy:    She has no cervical adenopathy.  Neurological: She is alert. Coordination normal.  Reflex Scores:      Patellar reflexes are 2+ on the right side and 2+ on the left side. Skin: Skin is warm and dry. No rash noted. She is not diaphoretic. No pallor.  Psychiatric: She has a normal mood and affect.  Nursing note and vitals reviewed.    ED Treatments / Results  DIAGNOSTIC STUDIES: Oxygen Saturation is 97% on RA, nl by my interpretation.    COORDINATION OF CARE: 11:36 AM Discussed treatment plan with pt at bedside which includes lumbar spine xray, right hip xray, and pt agreed to plan.   Labs (all labs ordered are listed, but only abnormal results are displayed) Labs Reviewed - No data to display  EKG  EKG Interpretation None       Radiology Dg Lumbar Spine Complete  Result Date: 06/05/2017 CLINICAL DATA:  Fall,  low back pain EXAM: LUMBAR SPINE - COMPLETE 4+ VIEW COMPARISON:  None. FINDINGS: Five lumbar-type vertebral bodies. Normal lumbar lordosis. No evidence of fracture or dislocation. Vertebral body heights and intervertebral disc spaces are maintained. Visualized bony pelvis appears intact. IMPRESSION: Negative. Electronically Signed   By: Charline Bills M.D.   On: 06/05/2017 12:21   Dg Hip Unilat W Or Wo Pelvis 2-3 Views Right  Result Date: 06/05/2017 CLINICAL DATA:  Fall EXAM: DG HIP (WITH OR WITHOUT PELVIS) 2-3V RIGHT COMPARISON:  None. FINDINGS: No fracture or dislocation is seen. Bilateral hip joint spaces are preserved. Visualized bony pelvis appears intact. IMPRESSION: Negative. Electronically Signed   By: Charline Bills M.D.   On: 06/05/2017 12:21    Procedures Procedures (including critical care time)  Medications Ordered in ED Medications - No data to display   Initial Impression / Assessment and Plan / ED Course  I have reviewed the triage vital signs and the nursing notes.  Pertinent labs & imaging results that were available during my care of the patient were reviewed by me and considered in my medical decision making (see chart for details).      Patient with lower back pain s/p fall yesterday. No neurological deficits and normal neuro exam. Patient is ambulatory.  No loss of bowel or bladder control.  No concern for cauda equina.  No fever, night sweats, weight loss, h/o cancer, IVDA, no recent procedure to back. No urinary symptoms suggestive of UTI. X-rays of the lumbar spine and right hip are negative. Pt will be discharged home with Norflex prescription, ibuprofen, and Tylenol. Supportive care and return precaution discussed. Appears safe for discharge at this time. Follow up with PCP as needed. Patient understands and agrees with plan. Patient vitals stable throughout ED course and discharged in satisfactory condition.  Final Clinical Impressions(s) / ED Diagnoses    Final diagnoses:  Acute right-sided low back pain without sciatica    New Prescriptions New Prescriptions   ACETAMINOPHEN (  TYLENOL) 500 MG TABLET    Take 1 tablet (500 mg total) by mouth every 6 (six) hours as needed.   IBUPROFEN (ADVIL,MOTRIN) 800 MG TABLET    Take 1 tablet (800 mg total) by mouth 3 (three) times daily.   ORPHENADRINE (NORFLEX) 100 MG TABLET    Take 1 tablet (100 mg total) by mouth 2 (two) times daily.   I personally performed the services described in this documentation, which was scribed in my presence. The recorded information has been reviewed and is accurate.     Emi HolesLaw, Laniyah Rosenwald M, PA-C 06/05/17 1305    Mancel BaleWentz, Elliott, MD 06/05/17 2100

## 2017-08-11 ENCOUNTER — Encounter (HOSPITAL_COMMUNITY): Payer: Self-pay

## 2017-08-11 ENCOUNTER — Emergency Department (HOSPITAL_COMMUNITY)
Admission: EM | Admit: 2017-08-11 | Discharge: 2017-08-11 | Disposition: A | Discharge status: Home / Self Care | Payer: BLUE CROSS/BLUE SHIELD | Attending: Emergency Medicine | Admitting: Emergency Medicine

## 2017-08-11 DIAGNOSIS — M25561 Pain in right knee: Secondary | ICD-10-CM | POA: Insufficient documentation

## 2017-08-11 DIAGNOSIS — Z79899 Other long term (current) drug therapy: Secondary | ICD-10-CM | POA: Insufficient documentation

## 2017-08-11 DIAGNOSIS — M25562 Pain in left knee: Secondary | ICD-10-CM | POA: Insufficient documentation

## 2017-08-11 MED ORDER — IBUPROFEN 600 MG PO TABS: 600.0000 mg | ORAL_TABLET | Freq: Four times a day (QID) | ORAL | 0 refills | Status: DC | PRN

## 2017-08-11 MED FILL — IBUPROFEN 600 MG TABLET: 600 | 7 days supply | Qty: 30 | Fill #0

## 2017-08-11 NOTE — ED Notes (Signed)
Bed: WA04 Expected date:  Expected time:  Means of arrival:  Comments: 

## 2017-08-11 NOTE — ED Triage Notes (Signed)
Patient c/o left knee pain x 2 days. Patient states she has been using Advil, ice and elevating with no relief. Patient states she stands 10 hours a day at her job.

## 2017-08-11 NOTE — ED Provider Notes (Signed)
[facility]-EMERGENCY DEPT Provider Note   CSN: [id number] Arrival date & time: 08/11/17  0920     History   Chief Complaint Chief Complaint  Patient presents with  . Knee Pain    HPI [name] is a 36 y.o. female who presents with bilateral knee pain. She states that her knees started hurting for the past two days. The left hurts worse than the right. The pain is on the lateral aspect of the knee bilaterally. No known injury but it sometimes feels like it will give out and pops with bending. The pain is aching and sharp. Walking makes it worse. This has happened before and she rested it and iced it with good relief. She has been doing the same and taking Ibuprofen with minimal relief this time. She states she walks a lot and stands for long periods of time at her job. No redness or swelling.   HPI  Past Medical History:  Diagnosis Date  . Anxiety   . Back pain, chronic   . Depression    hx of   . Dysrhythmia 2013   hx of a-fib  . Ectopic pregnancy   . GERD (gastroesophageal reflux disease)   . Headache    occasional   . Hypertension    was told, but never on meds  . Infection    UTI  . Ovarian cyst   . Urinary tract infection     Patient Active Problem List   Diagnosis Date Noted  . Ileus (HCC) 03/28/2015  . Incarcerated ventral hernia s/p lap repair w mesh 03/25/2014 03/26/2015  . Umbilical hernia s/p lap repair w mesh 03/25/2014 03/26/2015  . Bipolar disorder, unspecified (HCC) 09/01/2014  . Back muscle spasm 09/01/2014  . Smoking 09/01/2014  . Elevated BP 09/01/2014  . Atrial fibrillation, new 01/13/2013  . Nausea and vomiting in adult 01/13/2013  . Hypertension 01/13/2013    Past Surgical History:  Procedure Laterality Date  . CLEFT PALATE REPAIR    . etopical surgery    . LAPAROSCOPY FOR ECTOPIC PREGNANCY  2011  . TYMPANOPLASTY Right 08/03/2015   Procedure: RIGHT MEDIAL TECHNIQUE TYMPANOPLASTY;  Surgeon: Flo ShanksKarol Wolicki, MD;  Location: [facility];  Service: ENT;  Laterality: Right;  . VENTRAL HERNIA REPAIR N/A 03/26/2015   Procedure: LAPAROSCOPIC REPAIR OF INCARCERATED SUPERUMBILICAL HERNIA X2 AND UMBILICAL HERNIA REPAIR WITH MESH;  Surgeon: Karie SodaSteven Gross, MD;  Location: [facility];  Service: General;  Laterality: N/A;    OB History    Gravida Para Term Preterm AB Living   2       2 0   SAB TAB Ectopic Multiple Live Births   1 0 1 0         Home Medications    Prior to Admission medications   Medication Sig Start Date End Date Taking? Authorizing Provider  ibuprofen (ADVIL,MOTRIN) 200 MG tablet Take 400 mg by mouth every 6 (six) hours as needed (knee pain).   Yes [provider]  acetaminophen (TYLENOL) 500 MG tablet Take 1 tablet (500 mg total) by mouth every 6 (six) hours as needed. Patient not taking: Reported on 08/11/2017 06/05/17   Emi HolesLaw, Alexandra M, PA-C  albuterol (PROVENTIL HFA;VENTOLIN HFA) 108 (90 Base) MCG/ACT inhaler Inhale 2 puffs into the lungs every 6 (six) hours as needed for wheezing or shortness of breath (cough, shortness of breath or wheezing.). Patient not taking: Reported on 08/11/2017 12/28/15   Le, Thao P, DO  ibuprofen (ADVIL,MOTRIN) 800 MG  tablet Take 1 tablet (800 mg total) by mouth 3 (three) times daily. Patient not taking: Reported on 08/11/2017 06/05/17   Emi Holes, PA-C  orphenadrine (NORFLEX) 100 MG tablet Take 1 tablet (100 mg total) by mouth 2 (two) times daily. Patient not taking: Reported on 08/11/2017 06/05/17   Emi Holes, PA-C  sertraline (ZOLOFT) 25 MG tablet Take 1 tablet (25 mg total) by mouth daily. Patient not taking: Reported on 08/03/2016 12/28/15   Lenell Antu, DO    Family History Family History  Problem Relation Age of Onset  . Hypertension Mother   . Hypertension Father   . Cancer Sister   . Heart disease Maternal Grandmother   . Cancer Maternal Grandfather   . Other Neg Hx   . Hearing loss Neg Hx     Social History Social History  Substance Use Topics  .  Smoking status: Light Tobacco Smoker    Packs/day: 0.25    Years: 15.00    Types: Cigarettes  . Smokeless tobacco: Never Used  . Alcohol use 0.6 oz/week    1 Standard drinks or equivalent per week     Comment: social     Allergies   Flexeril [cyclobenzaprine]   Review of Systems Review of Systems  Musculoskeletal: Positive for arthralgias. Negative for gait problem and joint swelling.  Neurological: Negative for weakness and numbness.     Physical Exam Updated Vital Signs BP 125/82 (BP Location: Left Arm)   Pulse 91   Temp 98.3 F (36.8 C) (Oral)   Resp 16   Ht 5' (1.524 m)   Wt 56.7 kg (125 lb)   SpO2 100%   BMI 24.41 kg/m   Physical Exam  Constitutional: She is oriented to person, place, and time. She appears well-developed and well-nourished. No distress.  HENT:  Head: Normocephalic and atraumatic.  Eyes: Pupils are equal, round, and reactive to light. Conjunctivae are normal. Right eye exhibits no discharge. Left eye exhibits no discharge. No scleral icterus.  Neck: Normal range of motion.  Cardiovascular: Normal rate.   Pulmonary/Chest: Effort normal. No respiratory distress.  Abdominal: She exhibits no distension.  Musculoskeletal:  Left knee: No obvious swelling, deformity, or warmth. Tenderness to palpation of lateral aspect of knee. No tenderness of joint lines. FROM. N/V intact.  Right knee: No obvious swelling, deformity, or warmth. Tenderness to palpation of lateral aspect of knee around IT band insertion. No tenderness of joint lines. FROM. N/V intact.  Neurological: She is alert and oriented to person, place, and time.  Skin: Skin is warm and dry.  Psychiatric: She has a normal mood and affect. Her behavior is normal.  Nursing note and vitals reviewed.    ED Treatments / Results  Labs (all labs ordered are listed, but only abnormal results are displayed) Labs Reviewed - No data to display  EKG  EKG Interpretation None        Radiology No results found.  Procedures Procedures (including critical care time)  Medications Ordered in ED Medications - No data to display   Initial Impression / Assessment and Plan / ED Course  I have reviewed the triage vital signs and the nursing notes.  Pertinent labs & imaging results that were available during my care of the patient were reviewed by me and considered in my medical decision making (see chart for details).  36 year old female with bilateral lateral knee pain. Suspect overuse injury - likely IT band tendonitis. Discussed Xray but explained this  would likely not alter the course of her treatment plan. She verbalized understanding and will defer imaging. Discussed RICE protocol and will rx course of NSAIDs. Advised return for worsening symptoms.  Final Clinical Impressions(s) / ED Diagnoses   Final diagnoses:  Acute pain of both knees    New Prescriptions New Prescriptions   No medications on file     Bethel Born, PA-C 08/11/17 1541    Lavera Guise, MD 08/11/17 [phone]

## 2017-08-11 NOTE — Discharge Instructions (Signed)
Rest - please stay off knee as much as possible Ice - ice for 20 minutes at a time, several times a day Ibuprofen - take with food. Take up to 3-4 times daily for the next 5 days

## 2017-09-21 ENCOUNTER — Emergency Department (HOSPITAL_COMMUNITY): Payer: Self-pay

## 2017-09-21 ENCOUNTER — Encounter (HOSPITAL_COMMUNITY): Payer: Self-pay | Admitting: Emergency Medicine

## 2017-09-21 ENCOUNTER — Emergency Department (HOSPITAL_COMMUNITY)
Admission: EM | Admit: 2017-09-21 | Discharge: 2017-09-21 | Disposition: A | Discharge status: Home / Self Care | Payer: Self-pay | Attending: Emergency Medicine | Admitting: Emergency Medicine

## 2017-09-21 DIAGNOSIS — S99821A Other specified injuries of right foot, initial encounter: Secondary | ICD-10-CM | POA: Insufficient documentation

## 2017-09-21 DIAGNOSIS — Y9389 Activity, other specified: Secondary | ICD-10-CM | POA: Insufficient documentation

## 2017-09-21 DIAGNOSIS — X58XXXA Exposure to other specified factors, initial encounter: Secondary | ICD-10-CM | POA: Insufficient documentation

## 2017-09-21 DIAGNOSIS — Z79899 Other long term (current) drug therapy: Secondary | ICD-10-CM | POA: Insufficient documentation

## 2017-09-21 DIAGNOSIS — Y999 Unspecified external cause status: Secondary | ICD-10-CM | POA: Insufficient documentation

## 2017-09-21 DIAGNOSIS — I4891 Unspecified atrial fibrillation: Secondary | ICD-10-CM | POA: Insufficient documentation

## 2017-09-21 DIAGNOSIS — G8929 Other chronic pain: Secondary | ICD-10-CM | POA: Insufficient documentation

## 2017-09-21 DIAGNOSIS — Y929 Unspecified place or not applicable: Secondary | ICD-10-CM | POA: Insufficient documentation

## 2017-09-21 DIAGNOSIS — F17210 Nicotine dependence, cigarettes, uncomplicated: Secondary | ICD-10-CM | POA: Insufficient documentation

## 2017-09-21 DIAGNOSIS — I10 Essential (primary) hypertension: Secondary | ICD-10-CM | POA: Insufficient documentation

## 2017-09-21 DIAGNOSIS — S99921A Unspecified injury of right foot, initial encounter: Secondary | ICD-10-CM

## 2017-09-21 NOTE — Discharge Instructions (Signed)
X-ray showed no fracture. Likely a sprain.  Please rest, ice, compress and elevated the affected body part to help with swelling and pain.  Motrin and Tylenol for pain and swelling. Follow up with orthopedic doctor if symptoms not improving.

## 2017-09-21 NOTE — ED Provider Notes (Signed)
[facility]-EMERGENCY DEPT Provider Note   CSN: [id number] Arrival date & time: 09/21/17  1014     History   Chief Complaint Chief Complaint  Patient presents with  . Toe Injury    HPI [name] is a 36 y.o. female.  HPI 36 year old African American female presents to the ED with complaints of right pinky toe pain. Patient states that she is wearing her new shoes which have been rubbing on her right pinky toe. No obvious ulceration or erythema to the toe. Patient states that last night she was opening the door when she jammed her right pinky toe in the door. Patient states swelling and pain since then. Pain is worse with outpatient and ambulation. She has tried a propofol a little relief. Denies any associated paresthesias or weakness. Denies any associated wound. Past Medical History:  Diagnosis Date  . Anxiety   . Back pain, chronic   . Depression    hx of   . Dysrhythmia 2013   hx of a-fib  . Ectopic pregnancy   . GERD (gastroesophageal reflux disease)   . Headache    occasional   . Hypertension    was told, but never on meds  . Infection    UTI  . Ovarian cyst   . Urinary tract infection     Patient Active Problem List   Diagnosis Date Noted  . Ileus (HCC) 03/28/2015  . Incarcerated ventral hernia s/p lap repair w mesh 03/25/2014 03/26/2015  . Umbilical hernia s/p lap repair w mesh 03/25/2014 03/26/2015  . Bipolar disorder, unspecified (HCC) 09/01/2014  . Back muscle spasm 09/01/2014  . Smoking 09/01/2014  . Elevated BP 09/01/2014  . Atrial fibrillation, new 01/13/2013  . Nausea and vomiting in adult 01/13/2013  . Hypertension 01/13/2013    Past Surgical History:  Procedure Laterality Date  . CLEFT PALATE REPAIR    . etopical surgery    . LAPAROSCOPY FOR ECTOPIC PREGNANCY  2011  . TYMPANOPLASTY Right 08/03/2015   Procedure: RIGHT MEDIAL TECHNIQUE TYMPANOPLASTY;  Surgeon: Flo Shanks, MD;  [facility];  Service: ENT;  Laterality: Right;  . VENTRAL HERNIA REPAIR N/A 03/26/2015   Procedure: LAPAROSCOPIC REPAIR OF INCARCERATED SUPERUMBILICAL HERNIA X2 AND UMBILICAL HERNIA REPAIR WITH MESH;  Surgeon: Karie Soda, MD;  Location: [facility];  Service: General;  Laterality: N/A;    OB History    Gravida Para Term Preterm AB Living   2       2 0   SAB TAB Ectopic Multiple Live Births   1 0 1 0         Home Medications    Prior to Admission medications   Medication Sig Start Date End Date Taking? Authorizing Provider  ibuprofen (ADVIL,MOTRIN) 200 MG tablet Take 400 mg by mouth every 6 (six) hours as needed (knee pain).    [provider]  ibuprofen (ADVIL,MOTRIN) 600 MG tablet Take 1 tablet (600 mg total) by mouth every 6 (six) hours as needed. 08/11/17   Bethel Born, PA-C    Family History Family History  Problem Relation Age of Onset  . Hypertension Mother   . Hypertension Father   . Cancer Sister   . Heart disease Maternal Grandmother   . Cancer Maternal Grandfather   . Other Neg Hx   . Hearing loss Neg Hx     Social History Social History  Substance Use Topics  . Smoking status: Light Tobacco Smoker  Packs/day: 0.25    Years: 15.00    Types: Cigarettes  . Smokeless tobacco: Never Used  . Alcohol use 0.6 oz/week    1 Standard drinks or equivalent per week     Comment: social     Allergies   Flexeril [cyclobenzaprine]   Review of Systems Review of Systems  Constitutional: Negative for chills and fever.  Musculoskeletal: Positive for arthralgias, joint swelling and myalgias. Negative for gait problem.  Skin: Negative for color change and wound.  Neurological: Negative for weakness and numbness.     Physical Exam Updated Vital Signs BP 135/90 (BP Location: Left Arm)   Pulse 80   Temp 97.8 F (36.6 C) (Oral)   Resp 18   SpO2 100%   Physical Exam  Constitutional: She appears well-developed and well-nourished. No distress.  HENT:    Head: Normocephalic and atraumatic.  Eyes: Right eye exhibits no discharge. Left eye exhibits no discharge. No scleral icterus.  Neck: Normal range of motion.  Pulmonary/Chest: No respiratory distress.  Musculoskeletal: Normal range of motion. She exhibits edema and tenderness. She exhibits no deformity.  Mild edema noted to the right pinky toe. No ecchymosis noted. No erythema. Full range of motion. Tender to palpation at the PIP. No obvious deformity. Sensation intact. Cap refill is normal. Nailbed is intact. No laceration noted. DP pulses are 2+ bilaterally. No midfoot tenderness.  Neurological: She is alert.  Skin: Skin is warm and dry. Capillary refill takes less than 2 seconds. No pallor.  Psychiatric: Her behavior is normal. Judgment and thought content normal.  Nursing note and vitals reviewed.    ED Treatments / Results  Labs (all labs ordered are listed, but only abnormal results are displayed) Labs Reviewed - No data to display  EKG  EKG Interpretation None       Radiology No results found.  Procedures Procedures (including critical care time)  Medications Ordered in ED Medications - No data to display   Initial Impression / Assessment and Plan / ED Course  I have reviewed the triage vital signs and the nursing notes.  Pertinent labs & imaging results that were available during my care of the patient were reviewed by me and considered in my medical decision making (see chart for details).     Patient presented to the ED with right little toe pain. Mild edema noted. No wound or nailbed involvement. Patient is neurovascularly intact with full range of motion. Patient X-Ray negative for obvious fracture or dislocation. Pain managed in ED. Pt advised to follow up with orthopedics if symptoms persist for possibility of missed fracture diagnosis. Patient given buddy tape and post op shoe while in ED, conservative therapy recommended and discussed. Patient will be dc  home & is agreeable with above plan.   Final Clinical Impressions(s) / ED Diagnoses   Final diagnoses:  Injury of toe on right foot, initial encounter    New Prescriptions New Prescriptions   No medications on file     Wallace KellerLeaphart, Chereese Cilento T, PA-C 09/21/17 1216    Alvira MondaySchlossman, Erin, MD 09/21/17 2116

## 2017-09-21 NOTE — ED Triage Notes (Signed)
Pt hit R pinky toe with a door last night. Pain with bearing weight and when shoe rubs against it. Toe slightly swollen

## 2017-09-27 ENCOUNTER — Encounter (HOSPITAL_COMMUNITY): Payer: Self-pay | Admitting: Emergency Medicine

## 2017-09-27 ENCOUNTER — Emergency Department (HOSPITAL_COMMUNITY)
Admission: EM | Admit: 2017-09-27 | Discharge: 2017-09-27 | Disposition: A | Discharge status: Home / Self Care | Payer: BLUE CROSS/BLUE SHIELD | Attending: Emergency Medicine | Admitting: Emergency Medicine

## 2017-09-27 DIAGNOSIS — N63 Unspecified lump in unspecified breast: Secondary | ICD-10-CM

## 2017-09-27 DIAGNOSIS — N6310 Unspecified lump in the right breast, unspecified quadrant: Secondary | ICD-10-CM | POA: Insufficient documentation

## 2017-09-27 DIAGNOSIS — F17210 Nicotine dependence, cigarettes, uncomplicated: Secondary | ICD-10-CM | POA: Insufficient documentation

## 2017-09-27 DIAGNOSIS — I10 Essential (primary) hypertension: Secondary | ICD-10-CM | POA: Insufficient documentation

## 2017-09-27 MED ORDER — SULFAMETHOXAZOLE-TRIMETHOPRIM 800-160 MG PO TABS: 1.0000 | ORAL_TABLET | Freq: Two times a day (BID) | ORAL | 0 refills | Status: DC

## 2017-09-27 MED ORDER — CEPHALEXIN 500 MG PO CAPS: 500.0000 mg | ORAL_CAPSULE | Freq: Four times a day (QID) | ORAL | 0 refills | Status: DC

## 2017-09-27 NOTE — ED Triage Notes (Signed)
Pt complaint of lump on right areola, painful with touch, noted over past week.

## 2017-09-27 NOTE — ED Notes (Signed)
Bed: WTR8 Expected date:  Expected time:  Means of arrival:  Comments: 

## 2017-09-27 NOTE — Discharge Instructions (Signed)
You likely have either an abscess, cyst or mass.  Follow-up for further treatment through your primary care doctor and the breast center.  Antibiotics given to treat if it is an infection.

## 2017-09-27 NOTE — ED Provider Notes (Signed)
[facility]-EMERGENCY DEPT Provider Note   CSN: [id number] Arrival date & time: 09/27/17  1008     History   Chief Complaint Chief Complaint  Patient presents with  . Breast Mass    HPI [name] is a 36 y.o. female.  HPI Patient has had a tender breast mass for the last few days.  It is to the right side of her right nipple.  States it started small and she thought it may be an abscess.  No fevers.  States it is gotten bigger and more painful.  States she has pain in it when she is carrying things at that area.  No fevers.  Has not had other abscesses.  No history of breast cancer.  States she does not have insurance. Past Medical History:  Diagnosis Date  . Anxiety   . Back pain, chronic   . Depression    hx of   . Dysrhythmia 2013   hx of a-fib  . Ectopic pregnancy   . GERD (gastroesophageal reflux disease)   . Headache    occasional   . Hypertension    was told, but never on meds  . Infection    UTI  . Ovarian cyst   . Urinary tract infection     Patient Active Problem List   Diagnosis Date Noted  . Ileus (HCC) 03/28/2015  . Incarcerated ventral hernia s/p lap repair w mesh 03/25/2014 03/26/2015  . Umbilical hernia s/p lap repair w mesh 03/25/2014 03/26/2015  . Bipolar disorder, unspecified (HCC) 09/01/2014  . Back muscle spasm 09/01/2014  . Smoking 09/01/2014  . Elevated BP 09/01/2014  . Atrial fibrillation, new 01/13/2013  . Nausea and vomiting in adult 01/13/2013  . Hypertension 01/13/2013    Past Surgical History:  Procedure Laterality Date  . CLEFT PALATE REPAIR    . etopical surgery    . LAPAROSCOPY FOR ECTOPIC PREGNANCY  2011  . TYMPANOPLASTY Right 08/03/2015   Procedure: RIGHT MEDIAL TECHNIQUE TYMPANOPLASTY;  Surgeon: Flo Shanks, MD;  Location: [facility];  Service: ENT;  Laterality: Right;  . VENTRAL HERNIA REPAIR N/A 03/26/2015   Procedure: LAPAROSCOPIC REPAIR OF INCARCERATED SUPERUMBILICAL  HERNIA X2 AND UMBILICAL HERNIA REPAIR WITH MESH;  Surgeon: Karie Soda, MD;  Location: [facility];  Service: General;  Laterality: N/A;    OB History    Gravida Para Term Preterm AB Living   2       2 0   SAB TAB Ectopic Multiple Live Births   1 0 1 0         Home Medications    Prior to Admission medications   Medication Sig Start Date End Date Taking? Authorizing Provider  cephALEXin (KEFLEX) 500 MG capsule Take 1 capsule (500 mg total) by mouth 4 (four) times daily. 09/27/17   Benjiman Core, MD  ibuprofen (ADVIL,MOTRIN) 200 MG tablet Take 400 mg by mouth every 6 (six) hours as needed (knee pain).    [provider]  ibuprofen (ADVIL,MOTRIN) 600 MG tablet Take 1 tablet (600 mg total) by mouth every 6 (six) hours as needed. 08/11/17   Bethel Born, PA-C  sulfamethoxazole-trimethoprim (BACTRIM DS,SEPTRA DS) 800-160 MG tablet Take 1 tablet by mouth 2 (two) times daily. 09/27/17   Benjiman Core, MD    Family History Family History  Problem Relation Age of Onset  . Hypertension Mother   . Hypertension Father   . Cancer Sister   . Heart disease Maternal Grandmother   .  Cancer Maternal Grandfather   . Other Neg Hx   . Hearing loss Neg Hx     Social History Social History  Substance Use Topics  . Smoking status: Light Tobacco Smoker    Packs/day: 0.25    Years: 15.00    Types: Cigarettes  . Smokeless tobacco: Never Used  . Alcohol use 0.6 oz/week    1 Standard drinks or equivalent per week     Comment: social     Allergies   Flexeril [cyclobenzaprine]   Review of Systems Review of Systems  Constitutional: Negative for appetite change and fever.  Respiratory: Negative for shortness of breath.   Cardiovascular: Negative for chest pain.  Musculoskeletal: Negative for gait problem.  Skin: Negative for color change.  Neurological: Negative for tremors and numbness.     Physical Exam Updated Vital Signs BP (!) 122/92 (BP Location: Left Arm)    Pulse 80   Temp 98.2 F (36.8 C) (Oral)   Resp 16   Ht 5\' 1"  (1.549 m)   Wt 56.7 kg (125 lb)   SpO2 100%   BMI 23.62 kg/m   Physical Exam  Constitutional: She appears well-developed.  HENT:  Head: Atraumatic.  Cardiovascular: Normal rate.   Pulmonary/Chest:  Tender mass under the right side of the right nipple.  Approximately 1 cm.  No real skin changes.  No drainage.  No axillary lymph nodes palpated.  Neurological: She is alert.  Skin: Skin is warm. Capillary refill takes less than 2 seconds.     ED Treatments / Results  Labs (all labs ordered are listed, but only abnormal results are displayed) Labs Reviewed - No data to display  EKG  EKG Interpretation None       Radiology No results found.  Procedures Procedures (including critical care time)  Medications Ordered in ED Medications - No data to display   Initial Impression / Assessment and Plan / ED Course  I have reviewed the triage vital signs and the nursing notes.  Pertinent labs & imaging results that were available during my care of the patient were reviewed by me and considered in my medical decision making (see chart for details).     Patient with breast mass.  Likely cyst versus abscess versus other mass.  Given follow-up with PCP and the breast center.  [employer] elicited insurance but patient said she did not have any.  Will discharge with outpatient follow-up.  Antibiotics given to empirically treat for infection.  Final Clinical Impressions(s) / ED Diagnoses   Final diagnoses:  Breast mass    New Prescriptions Discharge Medication List as of 09/27/2017 11:17 AM    START taking these medications   Details  cephALEXin (KEFLEX) 500 MG capsule Take 1 capsule (500 mg total) by mouth 4 (four) times daily., Starting Wed 09/27/2017, Print    sulfamethoxazole-trimethoprim (BACTRIM DS,SEPTRA DS) 800-160 MG tablet Take 1 tablet by mouth 2 (two) times daily., Starting Wed 09/27/2017, Print          Benjiman CorePickering, Mickelle Goupil, MD 09/27/17 1141

## 2017-12-22 ENCOUNTER — Ambulatory Visit: Payer: BLUE CROSS/BLUE SHIELD | Admitting: Family Medicine

## 2017-12-22 ENCOUNTER — Other Ambulatory Visit: Payer: Self-pay

## 2017-12-22 ENCOUNTER — Encounter: Payer: Self-pay | Admitting: Family Medicine

## 2017-12-22 VITALS — BP 118/72 | HR 91 | Temp 98.7°F | Resp 18 | Ht 61.0 in | Wt 130.2 lb

## 2017-12-22 DIAGNOSIS — N760 Acute vaginitis: Secondary | ICD-10-CM

## 2017-12-22 DIAGNOSIS — Z113 Encounter for screening for infections with a predominantly sexual mode of transmission: Secondary | ICD-10-CM | POA: Diagnosis not present

## 2017-12-22 LAB — POCT WET + KOH PREP
Trich by wet prep: ABSENT
Yeast by KOH: ABSENT
Yeast by wet prep: ABSENT

## 2017-12-22 NOTE — Progress Notes (Signed)
1/18/20191:38 PM  ZERENITY BOWRON 09/22/1981, 37 y.o. female [id number]  Chief Complaint  Patient presents with  . Vaginal Itching    x last week   . Vaginal Discharge    white     HPI:   Patient is a 37 y.o. female with past medical history significant for trichomonas who presents today for a week of intermittent vaginal itchiness and white thin vaginal discharge. She denies any new sexual partners. She denies any urinary sx, abd or pelvic pain, nausea, vomiting, fever or chills.  Trichomonas Last pap 2017, normal per patient  Depression screen [facility] 2/9 12/22/2017 08/03/2016 03/08/2016  Decreased Interest 0 0 0  Down, Depressed, Hopeless 0 0 0  PHQ - 2 Score 0 0 0    Allergies  Allergen Reactions  . Flexeril [Cyclobenzaprine] Hives    Prior to Admission medications   Medication Sig Start Date End Date Taking? Authorizing Provider  ibuprofen (ADVIL,MOTRIN) 200 MG tablet Take 400 mg by mouth every 6 (six) hours as needed (knee pain).   Yes [provider]  ibuprofen (ADVIL,MOTRIN) 600 MG tablet Take 1 tablet (600 mg total) by mouth every 6 (six) hours as needed. 08/11/17  Yes Bethel Born, PA-C  sulfamethoxazole-trimethoprim (BACTRIM DS,SEPTRA DS) 800-160 MG tablet Take 1 tablet by mouth 2 (two) times daily. 09/27/17  Yes Benjiman Core, MD    Past Medical History:  Diagnosis Date  . Anxiety   . Back pain, chronic   . Depression    hx of   . Dysrhythmia 2013   hx of a-fib  . Ectopic pregnancy   . GERD (gastroesophageal reflux disease)   . Headache    occasional   . Hypertension    was told, but never on meds  . Infection    UTI  . Ovarian cyst   . Urinary tract infection     Past Surgical History:  Procedure Laterality Date  . CLEFT PALATE REPAIR    . etopical surgery    . LAPAROSCOPY FOR ECTOPIC PREGNANCY  2011  . TYMPANOPLASTY Right 08/03/2015   Procedure: RIGHT MEDIAL TECHNIQUE TYMPANOPLASTY;  Surgeon: Flo Shanks, MD;  Location:  [facility];  Service: ENT;  Laterality: Right;  . VENTRAL HERNIA REPAIR N/A 03/26/2015   Procedure: LAPAROSCOPIC REPAIR OF INCARCERATED SUPERUMBILICAL HERNIA X2 AND UMBILICAL HERNIA REPAIR WITH MESH;  Surgeon: Karie Soda, MD;  Location: [facility];  Service: General;  Laterality: N/A;    Social History   Tobacco Use  . Smoking status: Light Tobacco Smoker    Packs/day: 0.25    Years: 15.00    Pack years: 3.75    Types: Cigarettes  . Smokeless tobacco: Never Used  Substance Use Topics  . Alcohol use: Yes    Alcohol/week: 0.6 oz    Types: 1 Standard drinks or equivalent per week    Comment: social    Family History  Problem Relation Age of Onset  . Hypertension Mother   . Hypertension Father   . Cancer Sister   . Heart disease Maternal Grandmother   . Cancer Maternal Grandfather   . Other Neg Hx   . Hearing loss Neg Hx     ROS Per hpi  OBJECTIVE:  Blood pressure 118/72, pulse 91, temperature 98.7 F (37.1 C), temperature source Oral, resp. rate 18, height 5\' 1"  (1.549 m), weight 130 lb 3.2 oz (59.1 kg), SpO2 98 %.  Physical Exam  Constitutional: She is well-developed, well-nourished, and in no distress.  Genitourinary: Uterus is not enlarged, not fixed and not tender.  Cervix is not fixed. Cervix exhibits no motion tenderness and no lesion. Right adnexum displays no mass and no tenderness. Left adnexum displays no mass and no tenderness. Vulva exhibits no erythema, no lesion and no rash. Vagina exhibits rugosity. Vagina exhibits normal mucosa and no lesion. Thin  odorless  white and vaginal discharge found.      Results for orders placed or performed in visit on 12/22/17 (from the past 24 hour(s))  POCT Wet + KOH Prep     Status: Abnormal   Collection Time: 12/22/17  1:57 PM  Result Value Ref Range   Yeast by KOH Absent Absent   Yeast by wet prep Absent Absent   WBC by wet prep None (A) Few   Clue Cells Wet Prep HPF POC None None   Trich by wet prep  Absent Absent   Bacteria Wet Prep HPF POC Few Few   Epithelial Cells By [employer] Financial[employer]et Pref ([facility]) Few None, Few, Too numerous to count   RBC,UR,HPF,POC None None RBC/hpf    ASSESSMENT and PLAN  1. Vaginitis and vulvovaginitis Exam and wet prep normal. Discussed sitz bath and avoidance of irritants. Discussed importance of condom use. STD testing pending. RTC precautions reviewed.  - POCT Wet + KOH Prep  2. Screen for STD (sexually transmitted disease) - HIV antibody - RPR - GC/Chlamydia Probe Amp([facility])  Return if symptoms worsen or fail to improve.    Myles LippsIrma M Santiago, MD [facility] [address] [place], [place] [address] Ph.  [phone] Fax [phone]

## 2017-12-22 NOTE — Patient Instructions (Signed)
     IF you received an x-ray today, you will receive an invoice from [facility]. Please contact [facility] at [phone] with questions or concerns regarding your invoice.   IF you received labwork today, you will receive an invoice from [facility]. Please contact [facility] at [phone] with questions or concerns regarding your invoice.   Our billing staff will not be able to assist you with questions regarding bills from these companies.  You will be contacted with the lab results as soon as they are available. The fastest way to get your results is to activate your My Chart account. Instructions are located on the last page of this paperwork. If you have not heard from us regarding the results in 2 weeks, please contact this office.     

## 2017-12-23 LAB — HIV ANTIBODY (ROUTINE TESTING W REFLEX): HIV Screen 4th Generation wRfx: NONREACTIVE

## 2017-12-23 LAB — RPR: RPR Ser Ql: NONREACTIVE

## 2017-12-25 LAB — GC/CHLAMYDIA PROBE AMP
Chlamydia trachomatis, NAA: NEGATIVE
Neisseria gonorrhoeae by PCR: NEGATIVE

## 2018-01-03 ENCOUNTER — Encounter: Payer: Self-pay | Admitting: Family Medicine

## 2018-01-25 ENCOUNTER — Other Ambulatory Visit: Payer: Self-pay

## 2018-01-25 ENCOUNTER — Ambulatory Visit (HOSPITAL_COMMUNITY)
Admission: EM | Admit: 2018-01-25 | Discharge: 2018-01-25 | Disposition: A | Discharge status: Home / Self Care | Payer: BLUE CROSS/BLUE SHIELD | Attending: Family Medicine | Admitting: Family Medicine

## 2018-01-25 ENCOUNTER — Encounter (HOSPITAL_COMMUNITY): Payer: Self-pay | Admitting: Family Medicine

## 2018-01-25 DIAGNOSIS — M62830 Muscle spasm of back: Secondary | ICD-10-CM | POA: Diagnosis not present

## 2018-01-25 MED ORDER — METHOCARBAMOL 500 MG PO TABS: 500.0000 mg | ORAL_TABLET | Freq: Every evening | ORAL | 0 refills | Status: DC | PRN

## 2018-01-25 MED ORDER — PREDNISONE 20 MG PO TABS: ORAL_TABLET | ORAL | 0 refills | Status: DC

## 2018-01-25 MED FILL — METHOCARBAMOL 500 MG TABS: 500 | 5 days supply | Qty: 10 | Fill #0

## 2018-01-25 MED FILL — predniSONE 20 MG TABS: 20 | 5 days supply | Qty: 10 | Fill #0

## 2018-01-25 NOTE — ED Provider Notes (Signed)
[facility]   [id number] 01/25/18 Arrival Time: 1636   SUBJECTIVE:  [name] is a 37 y.o. female who presents to the urgent care with complaint of a muscle between her shoulder blades.  It hurts when she turns certain ways and when she takes in a deep breath.  Awoke with the pain yesterday.  Worse today.  No shortness of breath.  Past Medical History:  Diagnosis Date  . Anxiety   . Back pain, chronic   . Depression    hx of   . Dysrhythmia 2013   hx of a-fib  . Ectopic pregnancy   . GERD (gastroesophageal reflux disease)   . Headache    occasional   . Hypertension    was told, but never on meds  . Infection    UTI  . Ovarian cyst   . Urinary tract infection    Family History  Problem Relation Age of Onset  . Hypertension Mother   . Hypertension Father   . Cancer Sister   . Heart disease Maternal Grandmother   . Cancer Maternal Grandfather   . Other Neg Hx   . Hearing loss Neg Hx    Social History   Socioeconomic History  . Marital status: Single    Spouse name: Not on file  . Number of children: Not on file  . Years of education: Not on file  . Highest education level: Not on file  Social Needs  . Financial resource strain: Not on file  . Food insecurity - worry: Not on file  . Food insecurity - inability: Not on file  . Transportation needs - medical: Not on file  . Transportation needs - non-medical: Not on file  Occupational History  . Not on file  Tobacco Use  . Smoking status: Light Tobacco Smoker    Packs/day: 0.25    Years: 15.00    Pack years: 3.75    Types: Cigarettes  . Smokeless tobacco: Never Used  Substance and Sexual Activity  . Alcohol use: Yes    Alcohol/week: 0.6 oz    Types: 1 Standard drinks or equivalent per week    Comment: social  . Drug use: No  . Sexual activity: Yes    Partners: Male    Birth control/protection: None  Other Topics Concern  . Not on file  Social History Narrative  . Not on file     Current Meds  Medication Sig  . ibuprofen (ADVIL,MOTRIN) 600 MG tablet Take 1 tablet (600 mg total) by mouth every 6 (six) hours as needed.   Allergies  Allergen Reactions  . Flexeril [Cyclobenzaprine] Hives      ROS: As per HPI, remainder of ROS negative.   OBJECTIVE:   Vitals:   01/25/18 1658  BP: (!) 142/92  Pulse: 87  Resp: 16  Temp: (!) 97.4 F (36.3 C)  TempSrc: Oral  SpO2: 99%  Weight: 128 lb (58.1 kg)  Height: 5' (1.524 m)     General appearance: alert; no distress Eyes: PERRL; EOMI; conjunctiva normal HENT: normocephalic; atraumatic; ; oral mucosa normal Neck: supple; nontender Lungs: clear to auscultation bilaterally; tender left rhomboid muscles (paraspinal area) Heart: regular rate and rhythm  Back: no CVA tenderness Extremities: no cyanosis or edema; symmetrical with no gross deformities Skin: warm and dry Neurologic: normal gait; grossly normal Psychological: alert and cooperative; normal mood and affect      Labs:  Results for orders placed or performed in visit on 12/22/17  GC/Chlamydia  Probe Amp([facility])  Result Value Ref Range   Chlamydia trachomatis, NAA Negative Negative   Neisseria gonorrhoeae by PCR Negative Negative  HIV antibody  Result Value Ref Range   HIV Screen 4th Generation wRfx Non Reactive Non Reactive  RPR  Result Value Ref Range   RPR Ser Ql Non Reactive Non Reactive  POCT Wet + KOH Prep  Result Value Ref Range   Yeast by KOH Absent Absent   Yeast by wet prep Absent Absent   WBC by wet prep None (A) Few   Clue Cells Wet Prep HPF POC None None   Trich by wet prep Absent Absent   Bacteria Wet Prep HPF POC Few Few   Epithelial Cells By [employer]et Pref ([facility]) Few None, Few, Too numerous to count   RBC,UR,HPF,POC None None RBC/hpf    Labs Reviewed - No data to display  No results found.     ASSESSMENT & PLAN:  1. Muscle spasm of back     Meds ordered this encounter  Medications  . predniSONE (DELTASONE) 20  MG tablet    Sig: Two daily with food    Dispense:  10 tablet    Refill:  0  . methocarbamol (ROBAXIN) 500 MG tablet    Sig: Take 1 tablet (500 mg total) by mouth at bedtime as needed and may repeat dose one time if needed for muscle spasms.    Dispense:  10 tablet    Refill:  0    Reviewed expectations re: course of current medical issues. Questions answered. Outlined signs and symptoms indicating need for more acute intervention. Patient verbalized understanding. After Visit Summary given.    Procedures:      Elvina SidleLauenstein, Ivannah Zody, MD 01/25/18 1727

## 2018-01-25 NOTE — ED Triage Notes (Signed)
Pt reports feeling like she may have pulled a muscle between her shoulder blades.  It hurts when she turns certain ways and when she takes in a deep breath.

## 2018-02-19 ENCOUNTER — Emergency Department (HOSPITAL_COMMUNITY)
Admission: EM | Admit: 2018-02-19 | Discharge: 2018-02-19 | Disposition: A | Discharge status: Home / Self Care | Payer: BLUE CROSS/BLUE SHIELD | Attending: Emergency Medicine | Admitting: Emergency Medicine

## 2018-02-19 ENCOUNTER — Encounter (HOSPITAL_COMMUNITY): Payer: Self-pay | Admitting: Emergency Medicine

## 2018-02-19 DIAGNOSIS — Z79899 Other long term (current) drug therapy: Secondary | ICD-10-CM | POA: Insufficient documentation

## 2018-02-19 DIAGNOSIS — R059 Cough, unspecified: Secondary | ICD-10-CM

## 2018-02-19 DIAGNOSIS — R000 Tachycardia, unspecified: Secondary | ICD-10-CM | POA: Insufficient documentation

## 2018-02-19 DIAGNOSIS — I10 Essential (primary) hypertension: Secondary | ICD-10-CM | POA: Insufficient documentation

## 2018-02-19 DIAGNOSIS — Z87891 Personal history of nicotine dependence: Secondary | ICD-10-CM | POA: Insufficient documentation

## 2018-02-19 DIAGNOSIS — R05 Cough: Secondary | ICD-10-CM | POA: Insufficient documentation

## 2018-02-19 MED ORDER — HYDROCOD POLST-CPM POLST ER 10-8 MG/5ML PO SUER: 5.0000 mL | Freq: Every evening | ORAL | 0 refills | Status: DC | PRN

## 2018-02-19 MED ORDER — PREDNISONE 50 MG PO TABS: ORAL_TABLET | ORAL | 0 refills | Status: DC

## 2018-02-19 MED ORDER — BENZONATATE 100 MG PO CAPS: 100.0000 mg | ORAL_CAPSULE | Freq: Three times a day (TID) | ORAL | 0 refills | Status: DC

## 2018-02-19 MED FILL — HYDROCODONE-CHLORPHENIRAM S: 10-8 | 12 days supply | Qty: 60 | Fill #0

## 2018-02-19 MED FILL — BENZONATATE 100 MG CAP: 100 | 7 days supply | Qty: 21 | Fill #0

## 2018-02-19 NOTE — ED Provider Notes (Signed)
[facility]-EMERGENCY DEPT Provider Note   CSN: [id number] Arrival date & time: 02/19/18  1046     History   Chief Complaint Chief Complaint  Patient presents with  . Cough    HPI [name] is a 37 y.o. female who presents to the ED with a cough that started 3 days ago. She took Zyrtec because she thought was allergies. There was no improvement so she took Robitussin without relief. Patient complains that the cough is persistent with sinus pressure. Patient denies fever, chills or other problems. No hx of asthma.   HPI  Past Medical History:  Diagnosis Date  . Anxiety   . Back pain, chronic   . Depression    hx of   . Dysrhythmia 2013   hx of a-fib  . Ectopic pregnancy   . GERD (gastroesophageal reflux disease)   . Headache    occasional   . Hypertension    was told, but never on meds  . Infection    UTI  . Ovarian cyst   . Urinary tract infection     Patient Active Problem List   Diagnosis Date Noted  . Ileus (HCC) 03/28/2015  . Incarcerated ventral hernia s/p lap repair w mesh 03/25/2014 03/26/2015  . Umbilical hernia s/p lap repair w mesh 03/25/2014 03/26/2015  . Bipolar disorder, unspecified (HCC) 09/01/2014  . Back muscle spasm 09/01/2014  . Smoking 09/01/2014  . Elevated BP 09/01/2014  . Atrial fibrillation, new 01/13/2013  . Nausea and vomiting in adult 01/13/2013  . Hypertension 01/13/2013    Past Surgical History:  Procedure Laterality Date  . CLEFT PALATE REPAIR    . etopical surgery    . LAPAROSCOPY FOR ECTOPIC PREGNANCY  2011  . TYMPANOPLASTY Right 08/03/2015   Procedure: RIGHT MEDIAL TECHNIQUE TYMPANOPLASTY;  Surgeon: Flo Shanks, MD;  Location: [facility];  Service: ENT;  Laterality: Right;  . VENTRAL HERNIA REPAIR N/A 03/26/2015   Procedure: LAPAROSCOPIC REPAIR OF INCARCERATED SUPERUMBILICAL HERNIA X2 AND UMBILICAL HERNIA REPAIR WITH MESH;  Surgeon: Karie Soda, MD;  Location: [facility];  Service:  General;  Laterality: N/A;    OB History    Gravida Para Term Preterm AB Living   2       2 0   SAB TAB Ectopic Multiple Live Births   1 0 1 0         Home Medications    Prior to Admission medications   Medication Sig Start Date End Date Taking? Authorizing Provider  benzonatate (TESSALON) 100 MG capsule Take 1 capsule (100 mg total) by mouth every 8 (eight) hours. 02/19/18   Janne Napoleon, NP  chlorpheniramine-HYDROcodone [facility]) 10-8 MG/5ML SUER Take 5 mLs by mouth at bedtime as needed for cough. 02/19/18   Janne Napoleon, NP  ibuprofen (ADVIL,MOTRIN) 200 MG tablet Take 400 mg by mouth every 6 (six) hours as needed (knee pain).    [provider]  ibuprofen (ADVIL,MOTRIN) 600 MG tablet Take 1 tablet (600 mg total) by mouth every 6 (six) hours as needed. 08/11/17   Bethel Born, PA-C  methocarbamol (ROBAXIN) 500 MG tablet Take 1 tablet (500 mg total) by mouth at bedtime as needed and may repeat dose one time if needed for muscle spasms. 01/25/18   Elvina Sidle, MD  predniSONE (DELTASONE) 50 MG tablet Take one tablet PO daily 02/19/18   Janne Napoleon, NP    Family History Family History  Problem Relation  Age of Onset  . Hypertension Mother   . Hypertension Father   . Cancer Sister   . Heart disease Maternal Grandmother   . Cancer Maternal Grandfather   . Other Neg Hx   . Hearing loss Neg Hx     Social History Social History   Tobacco Use  . Smoking status: Former Smoker    Packs/day: 0.25    Years: 15.00    Pack years: 3.75    Types: Cigarettes  . Smokeless tobacco: Never Used  Substance Use Topics  . Alcohol use: Yes    Alcohol/week: 0.6 oz    Types: 1 Standard drinks or equivalent per week    Comment: social  . Drug use: No     Allergies   Flexeril [cyclobenzaprine]   Review of Systems Review of Systems  Constitutional: Negative for chills and fever.  HENT: Positive for congestion. Negative for sore throat and trouble  swallowing.   Eyes: Negative for visual disturbance.  Respiratory: Positive for cough. Negative for wheezing.   Cardiovascular: Negative for chest pain.  Gastrointestinal: Negative for abdominal pain, nausea and vomiting.  Genitourinary: Negative for dysuria, frequency and urgency.  Musculoskeletal: Negative for neck stiffness.  Skin: Negative for rash.  Neurological: Positive for headaches (sinus area).  Psychiatric/Behavioral: Negative for confusion.     Physical Exam Updated Vital Signs BP (!) 144/101 (BP Location: Left Arm)   Pulse (!) 101   Temp 99 F (37.2 C) (Oral)   Resp 18   Ht 5' (1.524 m)   Wt 58.1 kg (128 lb)   SpO2 100%   BMI 25.00 kg/m   Physical Exam  Constitutional: She appears well-developed and well-nourished. No distress.  HENT:  Head: Normocephalic and atraumatic.  Right Ear: Tympanic membrane normal.  Left Ear: Tympanic membrane normal.  Nose: Right sinus exhibits maxillary sinus tenderness. Left sinus exhibits maxillary sinus tenderness.  Eyes: EOM are normal.  Neck: Neck supple.  Cardiovascular: Regular rhythm. Tachycardia present.  Pulmonary/Chest: Effort normal.  Abdominal: Soft. There is no tenderness.  Musculoskeletal: Normal range of motion.  Neurological: She is alert.  Skin: Skin is warm and dry.  Psychiatric: She has a normal mood and affect. Her behavior is normal.  Nursing note and vitals reviewed.    ED Treatments / Results  Labs (all labs ordered are listed, but only abnormal results are displayed) Labs Reviewed - No data to display  Radiology No results found.  Procedures Procedures (including critical care time)  Medications Ordered in ED Medications - No data to display   Initial Impression / Assessment and Plan / ED Course  I have reviewed the triage vital signs and the nursing notes. Pt CXR negative for acute infiltrate. Patients symptoms are consistent with URI, likely viral etiology. Discussed that antibiotics  are not indicated for viral infections. Pt will be discharged with symptomatic treatment.  Verbalizes understanding and is agreeable with plan. Pt is hemodynamically stable & in NAD prior to dc. Cough medication and cool mist vaporizer, steroid burst.   Final Clinical Impressions(s) / ED Diagnoses   Final diagnoses:  Cough    ED Discharge Orders        Ordered    predniSONE (DELTASONE) 50 MG tablet     02/19/18 1425    benzonatate (TESSALON) 100 MG capsule  Every 8 hours     02/19/18 1425    chlorpheniramine-HYDROcodone (TUSSIONEX PENNKINETIC ER) 10-8 MG/5ML SUER  At bedtime PRN     02/19/18 1425  Kerrie Buffaloeese, Lavante Toso [place], [place] 02/19/18 1427    Derwood KaplanNanavati, Ankit, MD 02/19/18 1740

## 2018-02-19 NOTE — Discharge Instructions (Signed)
Follow-up with your primary care doctor.  Return here as needed °

## 2018-02-19 NOTE — ED Triage Notes (Signed)
Pt c/o cough x 3 days, has tried OTC meds. No improvement with cough meds or allergy meds.

## 2018-02-22 ENCOUNTER — Ambulatory Visit: Payer: BLUE CROSS/BLUE SHIELD | Admitting: Family Medicine

## 2018-02-23 ENCOUNTER — Ambulatory Visit (INDEPENDENT_AMBULATORY_CARE_PROVIDER_SITE_OTHER): Payer: BLUE CROSS/BLUE SHIELD | Admitting: Family Medicine

## 2018-02-23 ENCOUNTER — Encounter: Payer: Self-pay | Admitting: Family Medicine

## 2018-02-23 ENCOUNTER — Other Ambulatory Visit: Payer: Self-pay

## 2018-02-23 VITALS — BP 134/88 | HR 84 | Temp 98.1°F | Resp 17 | Ht 60.0 in | Wt 131.2 lb

## 2018-02-23 DIAGNOSIS — R030 Elevated blood-pressure reading, without diagnosis of hypertension: Secondary | ICD-10-CM | POA: Diagnosis not present

## 2018-02-23 DIAGNOSIS — J22 Unspecified acute lower respiratory infection: Secondary | ICD-10-CM | POA: Diagnosis not present

## 2018-02-23 MED ORDER — HYDROCOD POLST-CPM POLST ER 10-8 MG/5ML PO SUER: 5.0000 mL | Freq: Every evening | ORAL | 0 refills | Status: DC | PRN

## 2018-02-23 MED ORDER — AZITHROMYCIN 250 MG PO TABS: ORAL_TABLET | ORAL | 0 refills | Status: DC

## 2018-02-23 MED FILL — AZITHROMYCIN 250 MG TABS: 250 | 5 days supply | Qty: 6 | Fill #0

## 2018-02-23 NOTE — Patient Instructions (Addendum)
     IF you received an x-ray today, you will receive an invoice from [facility]. Please contact [facility] at [phone] with questions or concerns regarding your invoice.   IF you received labwork today, you will receive an invoice from [facility]. Please contact [facility] at [phone] with questions or concerns regarding your invoice.   Our billing staff will not be able to assist you with questions regarding bills from these companies.  You will be contacted with the lab results as soon as they are available. The fastest way to get your results is to activate your My Chart account. Instructions are located on the last page of this paperwork. If you have not heard from us regarding the results in 2 weeks, please contact this office.     Acute Bronchitis, Adult Acute bronchitis is when air tubes (bronchi) in the lungs suddenly get swollen. The condition can make it hard to breathe. It can also cause these symptoms:  A cough.  Coughing up clear, yellow, or green mucus.  Wheezing.  Chest congestion.  Shortness of breath.  A fever.  Body aches.  Chills.  A sore throat.  Follow these instructions at home: Medicines  Take over-the-counter and prescription medicines only as told by your doctor.  If you were prescribed an antibiotic medicine, take it as told by your doctor. Do not stop taking the antibiotic even if you start to feel better. General instructions  Rest.  Drink enough fluids to keep your pee (urine) clear or pale yellow.  Avoid smoking and secondhand smoke. If you smoke and you need help quitting, ask your doctor. Quitting will help your lungs heal faster.  Use an inhaler, cool mist vaporizer, or humidifier as told by your doctor.  Keep all follow-up visits as told by your doctor. This is important. How is this prevented? To lower your risk of getting this condition again:  Wash your hands often with soap and water. If you cannot  use soap and water, use hand sanitizer.  Avoid contact with people who have cold symptoms.  Try not to touch your hands to your mouth, nose, or eyes.  Make sure to get the flu shot every year.  Contact a doctor if:  Your symptoms do not get better in 2 weeks. Get help right away if:  You cough up blood.  You have chest pain.  You have very bad shortness of breath.  You become dehydrated.  You faint (pass out) or keep feeling like you are going to pass out.  You keep throwing up (vomiting).  You have a very bad headache.  Your fever or chills gets worse. This information is not intended to replace advice given to you by your health care provider. Make sure you discuss any questions you have with your health care provider. Document Released: 05/09/2008 Document Revised: 06/29/2016 Document Reviewed: 05/11/2016 Elsevier Interactive Patient Education  2018 [employer]  

## 2018-02-23 NOTE — Progress Notes (Signed)
3/22/201910:53 AM  [name] 1981/09/02, 37 y.o. female [id number]  Chief Complaint  Patient presents with  . Cough    x 2 weeks, seen in ED for cough after trying otc meds with no relief, given med tessalon perles and prednisone at ED to help with cough but cough continues.  Per pt one prednisone left, out of cough medication and pt does have tessalon tabs left.  No fevers, chest/throat hurt from coughing.  Cough medication breaking up  phlegm and phlegm is green.    HPI:   Patient is a 37 y.o. female who presents today for 2 weeks of worsening productive cough, mild SOB, fatigue. She denies any fever or chills. She denies any sinus or ear pain. She has mild nasal congestion and an irritated throat. She was seen in the er, rx prednisone, no changes to cough. Does not smoke. Denies h/o asthma. States gerd is controlled.  Depression screen [facility] 2/9 02/23/2018 12/22/2017 08/03/2016  Decreased Interest 0 0 0  Down, Depressed, Hopeless 0 0 0  PHQ - 2 Score 0 0 0    Allergies  Allergen Reactions  . Flexeril [Cyclobenzaprine] Hives    Prior to Admission medications   Medication Sig Start Date End Date Taking? Authorizing Provider  benzonatate (TESSALON) 100 MG capsule Take 1 capsule (100 mg total) by mouth every 8 (eight) hours. 02/19/18  Yes Neese, Hope M, NP  ibuprofen (ADVIL,MOTRIN) 200 MG tablet Take 400 mg by mouth every 6 (six) hours as needed (knee pain).   Yes [provider]  predniSONE (DELTASONE) 50 MG tablet Take one tablet PO daily 02/19/18  Yes Neese, Lawrence, NP  chlorpheniramine-HYDROcodone (TUSSIONEX PENNKINETIC ER) 10-8 MG/5ML SUER Take 5 mLs by mouth at bedtime as needed for cough. Patient not taking: Reported on 02/23/2018 02/19/18   Janne Napoleon, NP  ibuprofen (ADVIL,MOTRIN) 600 MG tablet Take 1 tablet (600 mg total) by mouth every 6 (six) hours as needed. 08/11/17   Bethel Born, PA-C    Past Medical History:  Diagnosis Date  . Anxiety   . Back  pain, chronic   . Depression    hx of   . Dysrhythmia 2013   hx of a-fib  . Ectopic pregnancy   . GERD (gastroesophageal reflux disease)   . Headache    occasional   . Hypertension    was told, but never on meds  . Infection    UTI  . Ovarian cyst   . Urinary tract infection     Past Surgical History:  Procedure Laterality Date  . CLEFT PALATE REPAIR    . etopical surgery    . LAPAROSCOPY FOR ECTOPIC PREGNANCY  2011  . TYMPANOPLASTY Right 08/03/2015   Procedure: RIGHT MEDIAL TECHNIQUE TYMPANOPLASTY;  Surgeon: Flo Shanks, MD;  Location: [facility];  Service: ENT;  Laterality: Right;  . VENTRAL HERNIA REPAIR N/A 03/26/2015   Procedure: LAPAROSCOPIC REPAIR OF INCARCERATED SUPERUMBILICAL HERNIA X2 AND UMBILICAL HERNIA REPAIR WITH MESH;  Surgeon: Karie Soda, MD;  Location: [facility];  Service: General;  Laterality: N/A;    Social History   Tobacco Use  . Smoking status: Former Smoker    Packs/day: 0.25    Years: 15.00    Pack years: 3.75    Types: Cigarettes  . Smokeless tobacco: Never Used  Substance Use Topics  . Alcohol use: Yes    Alcohol/week: 0.6 oz    Types: 1 Standard drinks or equivalent per week  Comment: social    Family History  Problem Relation Age of Onset  . Hypertension Mother   . Hypertension Father   . Cancer Sister   . Heart disease Maternal Grandmother   . Cancer Maternal Grandfather   . Other Neg Hx   . Hearing loss Neg Hx     ROS Per hpi  OBJECTIVE:   Blood pressure 134/88, pulse 84, temperature 98.1 F (36.7 C), temperature source Oral, resp. rate 17, height 5' (1.524 m), weight 131 lb 3.2 oz (59.5 kg), SpO2 97 %.  Physical Exam  Constitutional: She is oriented to person, place, and time and well-developed, well-nourished, and in no distress.  HENT:  Head: Normocephalic and atraumatic.  Right Ear: Hearing, tympanic membrane, external ear and ear canal normal.  Left Ear: Hearing, tympanic membrane, external ear and  ear canal normal.  Mouth/Throat: Oropharynx is clear and moist.  Eyes: Pupils are equal, round, and reactive to light. EOM are normal.  Neck: Neck supple.  Cardiovascular: Normal rate, regular rhythm and normal heart sounds. Exam reveals no gallop and no friction rub.  No murmur heard. Pulmonary/Chest: Effort normal. She has no wheezes. She has rhonchi (cleared up with coughing) in the left lower field. She has no rales.  Lymphadenopathy:    She has no cervical adenopathy.  Neurological: She is alert and oriented to person, place, and time. Gait normal.  Skin: Skin is warm and dry.    ASSESSMENT and PLAN  1. Lower respiratory tract infection Discussed supportive measures, new meds r/se/b and RTC precautions. Patient educational handout given.  2. Elevated BP without diagnosis of hypertension - Care order/instruction: repeat BP was < 140/90  Other orders - azithromycin (ZITHROMAX) 250 MG tablet; Take 2 tabs PO on first day, then take 1 tab PO x 4 days - chlorpheniramine-HYDROcodone (TUSSIONEX PENNKINETIC ER) 10-8 MG/5ML SUER; Take 5 mLs by mouth at bedtime as needed for cough.  Return if symptoms worsen or fail to improve.    Myles LippsIrma M Santiago, MD Primary Care at [facility] [address] [place], [place] [address] Ph.  [phone] Fax [phone]

## 2018-03-01 MED FILL — HYDROCODONE-CHLORPHENIRAM S: 10-8 | 12 days supply | Qty: 60 | Fill #0

## 2018-04-08 ENCOUNTER — Other Ambulatory Visit: Payer: Self-pay

## 2018-04-08 ENCOUNTER — Ambulatory Visit (HOSPITAL_COMMUNITY)
Admission: EM | Admit: 2018-04-08 | Discharge: 2018-04-08 | Disposition: A | Discharge status: Home / Self Care | Payer: BLUE CROSS/BLUE SHIELD | Attending: Family Medicine | Admitting: Family Medicine

## 2018-04-08 ENCOUNTER — Encounter (HOSPITAL_COMMUNITY): Payer: Self-pay | Admitting: Emergency Medicine

## 2018-04-08 ENCOUNTER — Ambulatory Visit (INDEPENDENT_AMBULATORY_CARE_PROVIDER_SITE_OTHER): Payer: BLUE CROSS/BLUE SHIELD

## 2018-04-08 DIAGNOSIS — S82891A Other fracture of right lower leg, initial encounter for closed fracture: Secondary | ICD-10-CM

## 2018-04-08 MED ORDER — HYDROCODONE-ACETAMINOPHEN 7.5-325 MG PO TABS: 1.0000 | ORAL_TABLET | Freq: Four times a day (QID) | ORAL | 0 refills | Status: DC | PRN

## 2018-04-08 NOTE — ED Provider Notes (Signed)
[facility]    CSN: [id number] Arrival date & time: 04/08/18  1211     History   Chief Complaint Chief Complaint  Patient presents with  . Ankle Pain    HPI [name] is a 37 y.o. female.   HPI  Patient rolled her ankle yesterday and fell.  She is hardly bearing weight today.  She has pain in her lateral ankle.  She states she has recurring ankle sprains.  Is very swollen.  She is used no ice.  She is taken no medication.  Denies any prior ankle fractures.  Denies other pain from the fall.  Past Medical History:  Diagnosis Date  . Anxiety   . Back pain, chronic   . Depression    hx of   . Dysrhythmia 2013   hx of a-fib  . Ectopic pregnancy   . GERD (gastroesophageal reflux disease)   . Headache    occasional   . Hypertension    was told, but never on meds  . Infection    UTI  . Ovarian cyst   . Urinary tract infection     Patient Active Problem List   Diagnosis Date Noted  . Ileus (HCC) 03/28/2015  . Incarcerated ventral hernia s/p lap repair w mesh 03/25/2014 03/26/2015  . Umbilical hernia s/p lap repair w mesh 03/25/2014 03/26/2015  . Bipolar disorder, unspecified (HCC) 09/01/2014  . Back muscle spasm 09/01/2014  . Smoking 09/01/2014  . Elevated BP 09/01/2014  . Atrial fibrillation, new 01/13/2013  . Nausea and vomiting in adult 01/13/2013  . Hypertension 01/13/2013    Past Surgical History:  Procedure Laterality Date  . CLEFT PALATE REPAIR    . etopical surgery    . LAPAROSCOPY FOR ECTOPIC PREGNANCY  2011  . TYMPANOPLASTY Right 08/03/2015   Procedure: RIGHT MEDIAL TECHNIQUE TYMPANOPLASTY;  Surgeon: Flo Shanks, MD;  Location: [facility];  Service: ENT;  Laterality: Right;  . VENTRAL HERNIA REPAIR N/A 03/26/2015   Procedure: LAPAROSCOPIC REPAIR OF INCARCERATED SUPERUMBILICAL HERNIA X2 AND UMBILICAL HERNIA REPAIR WITH MESH;  Surgeon: Karie Soda, MD;  Location: [facility];  Service: General;  Laterality: N/A;    OB  History    Gravida  2   Para      Term      Preterm      AB  2   Living  0     SAB  1   TAB  0   Ectopic  1   Multiple  0   Live Births               Home Medications    Prior to Admission medications   Medication Sig Start Date End Date Taking? Authorizing Provider  HYDROcodone-acetaminophen (NORCO) 7.5-325 MG tablet Take 1 tablet by mouth every 6 (six) hours as needed for moderate pain. 04/08/18   Eustace Moore, MD  ibuprofen (ADVIL,MOTRIN) 600 MG tablet Take 1 tablet (600 mg total) by mouth every 6 (six) hours as needed. 08/11/17   Bethel Born, PA-C    Family History Family History  Problem Relation Age of Onset  . Hypertension Mother   . Hypertension Father   . Cancer Sister   . Heart disease Maternal Grandmother   . Cancer Maternal Grandfather   . Other Neg Hx   . Hearing loss Neg Hx     Social History Social History   Tobacco Use  . Smoking status: Former Smoker  Packs/day: 0.25    Years: 15.00    Pack years: 3.75    Types: Cigarettes  . Smokeless tobacco: Never Used  Substance Use Topics  . Alcohol use: Yes    Alcohol/week: 0.6 oz    Types: 1 Standard drinks or equivalent per week    Comment: social  . Drug use: No     Allergies   Flexeril [cyclobenzaprine]   Review of Systems Review of Systems  Constitutional: Negative for chills and fever.  HENT: Negative for ear pain and sore throat.   Eyes: Negative for pain and visual disturbance.  Respiratory: Negative for cough and shortness of breath.   Cardiovascular: Negative for chest pain and palpitations.  Gastrointestinal: Negative for abdominal pain and vomiting.  Genitourinary: Negative for dysuria and hematuria.  Musculoskeletal: Positive for arthralgias and gait problem. Negative for back pain.  Skin: Negative for color change and rash.  Neurological: Negative for seizures and syncope.  All other systems reviewed and are negative.    Physical Exam Triage Vital  Signs ED Triage Vitals  Enc Vitals Group     BP 04/08/18 1301 (!) 144/92     Pulse Rate 04/08/18 1301 88     Resp 04/08/18 1301 18     Temp 04/08/18 1301 97.8 F (36.6 C)     Temp Source 04/08/18 1301 Oral     SpO2 04/08/18 1301 97 %     Weight --      Height --      Head Circumference --      Peak Flow --      Pain Score 04/08/18 1258 10     Pain Loc --      Pain Edu? --      Excl. in GC? --    No data found.  Updated Vital Signs BP (!) 144/92 (BP Location: Left Arm)   Pulse 88   Temp 97.8 F (36.6 C) (Oral)   Resp 18   SpO2 97%      Physical Exam  Constitutional: She appears well-developed and well-nourished. No distress.  HENT:  Head: Normocephalic and atraumatic.  Eyes: Conjunctivae are normal.  Neck: Neck supple.  Cardiovascular: Normal rate and regular rhythm.  No murmur heard. Pulmonary/Chest: Effort normal and breath sounds normal. No respiratory distress.  Musculoskeletal: Normal range of motion. She exhibits no edema.  Right ankle is point tender over the lateral malleolus.  There is considerable swelling over the lateral malleolus.  Range of motion is full to flexion extension.  Patient resists inversion.  Distal neurovascular is intact.  No tenderness over the heel, medial ankle, or foot  Lymphadenopathy:    She has no cervical adenopathy.  Neurological: She is alert.  Skin: Skin is warm and dry.  Psychiatric: She has a normal mood and affect.  Nursing note and vitals reviewed.    UC Treatments / Results  Labs (all labs ordered are listed, but only abnormal results are displayed) Labs Reviewed - No data to display  EKG None  Radiology Dg Ankle Complete Right  Result Date: 04/08/2018 CLINICAL DATA:  Right ankle injury last night with lateral pain EXAM: RIGHT ANKLE - COMPLETE 3+ VIEW COMPARISON:  09/21/2017 right foot radiograph FINDINGS: Mild diffuse right ankle soft tissue swelling, most prominent laterally. Minimally displaced acute avulsion  fracture fragments at the tip of the right lateral malleolus. No additional fracture. No subluxation. No suspicious focal osseous lesion. No significant arthropathy. No radiopaque foreign body. IMPRESSION: Mild diffuse right ankle  soft tissue swelling, most prominent laterally. Minimally displaced acute avulsion fracture fragments at the tip of the right lateral malleolus. No subluxation. Electronically Signed   By: Delbert Phenix M.D.   On: 04/08/2018 14:12    Procedures Procedures (including critical care time)  Medications Ordered in UC Medications - No data to display  Initial Impression / Assessment and Plan / UC Course  I have reviewed the triage vital signs and the nursing notes.  Pertinent labs & imaging results that were available during my care of the patient were reviewed by me and considered in my medical decision making (see chart for details).     I discussed with the patient that an avulsion fracture of the lateral malleolus is consistent with a bad ankle sprain and should heal well.  I do recommend follow-up with orthopedic.  She is placed into a Cam walking boot.  Instructed to use ice and elevation.  Is given a limited prescription for hydrocodone, with cautions for drowsiness dizziness and constipation Final Clinical Impressions(s) / UC Diagnoses   Final diagnoses:  Closed avulsion fracture of right ankle, initial encounter     Discharge Instructions     Ice. Elevate. Wear boot at all times when up Take the pain medicine as needed Call [facility] in the morning for an appt next week    ED Prescriptions    Medication Sig Dispense Auth. Provider   HYDROcodone-acetaminophen (NORCO) 7.5-325 MG tablet Take 1 tablet by mouth every 6 (six) hours as needed for moderate pain. 30 tablet Eustace Moore, MD     Controlled Substance Prescriptions [place] Controlled Substance Registry consulted? Yes Patient has had 2 prescriptions for cough medicine old prescriptions for  clonazepam.  No pain medication in 2 years.  Eustace Moore, MD 04/08/18 [phone]

## 2018-04-08 NOTE — Discharge Instructions (Addendum)
Ice. Elevate. Wear boot at all times when up Take the pain medicine as needed Call [facility] ortho in the morning for an appt next week

## 2018-04-08 NOTE — ED Triage Notes (Signed)
Stepped off a curbing where concrete and grass meet and fell to the ground.  Patient complaining of right ankle pain and swelling

## 2018-04-13 ENCOUNTER — Encounter (INDEPENDENT_AMBULATORY_CARE_PROVIDER_SITE_OTHER): Payer: Self-pay | Admitting: Orthopaedic Surgery

## 2018-04-13 ENCOUNTER — Ambulatory Visit (INDEPENDENT_AMBULATORY_CARE_PROVIDER_SITE_OTHER): Payer: BLUE CROSS/BLUE SHIELD | Admitting: Orthopaedic Surgery

## 2018-04-13 DIAGNOSIS — S82891A Other fracture of right lower leg, initial encounter for closed fracture: Secondary | ICD-10-CM | POA: Diagnosis not present

## 2018-04-13 MED ORDER — MELOXICAM 7.5 MG PO TABS: 7.5000 mg | ORAL_TABLET | Freq: Every day | ORAL | 2 refills | Status: DC | PRN

## 2018-04-13 MED ORDER — HYDROCODONE-ACETAMINOPHEN 5-325 MG PO TABS: 1.0000 | ORAL_TABLET | Freq: Three times a day (TID) | ORAL | 0 refills | Status: DC | PRN

## 2018-04-13 NOTE — Progress Notes (Signed)
Office Visit Note   Patient: [name]           Date of Birth: November 19, 1981           MRN: [id number] Visit Date: 04/13/2018              Requested by: No referring provider defined for this encounter. PCP: System, Provider Not In   Assessment & Plan: Visit Diagnoses:  1. Closed fracture of right ankle, initial encounter     Plan: Impression is avulsion fracture of the lateral malleolus, right ankle.  We will treat this conservatively.  She will be weightbearing as tolerated in a cam walker.  Ice and elevate for swelling.  Follow-up with [place] in 3 weeks time.  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  Call with concerns or questions in the meantime.  Follow-Up Instructions: Return in about 3 weeks (around 05/04/2018).   Orders:  No orders of the defined types were placed in this encounter.  Meds ordered this encounter  Medications  . meloxicam (MOBIC) 7.5 MG tablet    Sig: Take 1 tablet (7.5 mg total) by mouth daily as needed for pain.    Dispense:  30 tablet    Refill:  2  . HYDROcodone-acetaminophen (NORCO) 5-325 MG tablet    Sig: Take 1 tablet by mouth 3 (three) times daily as needed for moderate pain.    Dispense:  15 tablet    Refill:  0      Procedures: No procedures performed   Clinical Data: No additional findings.   Subjective: Chief Complaint  Patient presents with  . Right Ankle - Pain, Fracture    HPI patient is a pleasant 37 year old female who presents to our clinic today with a new injury to her right ankle.  She was getting out of her car when she stepped on the curb inverting her right ankle.  This was on 04/07/2018.  She was seen in urgent care the following day where x-rays were obtained.  These showed an avulsion fracture off the lateral malleolus.  She was placed in a cam walker and told to follow-up with [place].  She has had moderate pain to the lateral ankle which is somewhat relieved with  hydrocodone.  Pain is worse when she first gets up in the morning as well as when she has been walking around for a while.  She denies any numbness, tingling or burning.  She does note a previous history of multiple right ankle sprains in the past.  Review of Systems as detailed in HPI.  All others reviewed and are negative.   Objective: Vital Signs: There were no vitals taken for this visit.  Physical Exam well-developed well-nourished female no acute distress.  Alert and oriented x3.  Ortho Exam examination of the right ankle reveals minimal to moderate swelling.  Marked tenderness over the lateral malleolus.  Limited range of motion secondary to pain.  She is neurovascularly intact distally.  Specialty Comments:  No specialty comments available.  Imaging: No new imaging today.   PMFS History: Patient Active Problem List   Diagnosis Date Noted  . Closed fracture of right ankle 04/13/2018  . Ileus (HCC) 03/28/2015  . Incarcerated ventral hernia s/p lap repair w mesh 03/25/2014 03/26/2015  . Umbilical hernia s/p lap repair w mesh 03/25/2014 03/26/2015  . Bipolar disorder, unspecified (HCC) 09/01/2014  . Back muscle spasm 09/01/2014  . Smoking 09/01/2014  .  Elevated BP 09/01/2014  . Atrial fibrillation, new 01/13/2013  . Nausea and vomiting in adult 01/13/2013  . Hypertension 01/13/2013   Past Medical History:  Diagnosis Date  . Anxiety   . Back pain, chronic   . Depression    hx of   . Dysrhythmia 2013   hx of a-fib  . Ectopic pregnancy   . GERD (gastroesophageal reflux disease)   . Headache    occasional   . Hypertension    was told, but never on meds  . Infection    UTI  . Ovarian cyst   . Urinary tract infection     Family History  Problem Relation Age of Onset  . Hypertension Mother   . Hypertension Father   . Cancer Sister   . Heart disease Maternal Grandmother   . Cancer Maternal Grandfather   . Other Neg Hx   . Hearing loss Neg Hx     Past Surgical  History:  Procedure Laterality Date  . CLEFT PALATE REPAIR    . etopical surgery    . LAPAROSCOPY FOR ECTOPIC PREGNANCY  2011  . TYMPANOPLASTY Right 08/03/2015   Procedure: RIGHT MEDIAL TECHNIQUE TYMPANOPLASTY;  Surgeon: Flo Shanks, MD;  Location: [facility];  Service: ENT;  Laterality: Right;  . VENTRAL HERNIA REPAIR N/A 03/26/2015   Procedure: LAPAROSCOPIC REPAIR OF INCARCERATED SUPERUMBILICAL HERNIA X2 AND UMBILICAL HERNIA REPAIR WITH MESH;  Surgeon: Karie Soda, MD;  Location: [facility];  Service: General;  Laterality: N/A;   Social History   Occupational History  . Not on file  Tobacco Use  . Smoking status: Former Smoker    Packs/day: 0.25    Years: 15.00    Pack years: 3.75    Types: Cigarettes  . Smokeless tobacco: Never Used  Substance and Sexual Activity  . Alcohol use: Yes    Alcohol/week: 0.6 oz    Types: 1 Standard drinks or equivalent per week    Comment: social  . Drug use: No  . Sexual activity: Yes    Partners: Male    Birth control/protection: None

## 2018-04-16 ENCOUNTER — Telehealth (INDEPENDENT_AMBULATORY_CARE_PROVIDER_SITE_OTHER): Payer: Self-pay | Admitting: Orthopaedic Surgery

## 2018-04-16 NOTE — Telephone Encounter (Signed)
[employer] told pt they already faxed info  Disability paperwork   Pt called left VM would like to know if the office recived paperwork from  [facility]

## 2018-04-16 NOTE — Telephone Encounter (Signed)
I do not recall one for this specifically. If it was received however, it would have been forwarded to [facility].

## 2018-04-16 NOTE — Telephone Encounter (Signed)
I have the [facility] paperwork. I'm bringing it over today for a signature.

## 2018-04-16 NOTE — Telephone Encounter (Signed)
Can you please let patient know?    Thank you!

## 2018-04-16 NOTE — Telephone Encounter (Signed)
I have not received anything have y'all?

## 2018-05-04 ENCOUNTER — Ambulatory Visit (INDEPENDENT_AMBULATORY_CARE_PROVIDER_SITE_OTHER): Payer: BLUE CROSS/BLUE SHIELD | Admitting: Orthopaedic Surgery

## 2018-05-09 ENCOUNTER — Encounter (INDEPENDENT_AMBULATORY_CARE_PROVIDER_SITE_OTHER): Payer: Self-pay | Admitting: Orthopaedic Surgery

## 2018-05-09 ENCOUNTER — Ambulatory Visit (INDEPENDENT_AMBULATORY_CARE_PROVIDER_SITE_OTHER): Payer: BLUE CROSS/BLUE SHIELD | Admitting: Orthopaedic Surgery

## 2018-05-09 DIAGNOSIS — S82891A Other fracture of right lower leg, initial encounter for closed fracture: Secondary | ICD-10-CM | POA: Diagnosis not present

## 2018-05-09 NOTE — Progress Notes (Signed)
Office Visit Note   Patient: [name]           Date of Birth: 1981-04-26           MRN: [id number] Visit Date: 05/09/2018              Requested by: No referring provider defined for this encounter. PCP: System, Provider Not In   Assessment & Plan: Visit Diagnoses:  1. Closed fracture of right ankle, initial encounter     Plan: Patient is one-month status post avulsion fracture of fibula.  At this point we will transition her to an ASO brace and outpatient therapy referral was given today.  If she has any issues she has been instructed to follow-up but otherwise we can see her back as needed.  Follow-Up Instructions: Return if symptoms worsen or fail to improve.   Orders:  No orders of the defined types were placed in this encounter.  No orders of the defined types were placed in this encounter.     Procedures: No procedures performed   Clinical Data: No additional findings.   Subjective: Chief Complaint  Patient presents with  . Right Ankle - Pain    [name] is following up for her avulsion fracture.  She is overall doing better.  She still has some soreness.  She is been wearing a cam walker.   Review of Systems  Constitutional: Negative.   HENT: Negative.   Eyes: Negative.   Respiratory: Negative.   Cardiovascular: Negative.   Endocrine: Negative.   Musculoskeletal: Negative.   Neurological: Negative.   Hematological: Negative.   Psychiatric/Behavioral: Negative.   All other systems reviewed and are negative.    Objective: Vital Signs: There were no vitals taken for this visit.  Physical Exam  Constitutional: She is oriented to person, place, and time. She appears well-developed and well-nourished.  Pulmonary/Chest: Effort normal.  Neurological: She is alert and oriented to person, place, and time.  Skin: Skin is warm. Capillary refill takes less than 2 seconds.  Psychiatric: She has a normal mood and affect. Her behavior is normal.  Judgment and thought content normal.  Nursing note and vitals reviewed.   Ortho Exam Right ankle exam shows minimal swelling.  Mild tenderness palpation of the distal fibula.  Exam is otherwise unremarkable. Specialty Comments:  No specialty comments available.  Imaging: No results found.   PMFS History: Patient Active Problem List   Diagnosis Date Noted  . Closed fracture of right ankle 04/13/2018  . Ileus (HCC) 03/28/2015  . Incarcerated ventral hernia s/p lap repair w mesh 03/25/2014 03/26/2015  . Umbilical hernia s/p lap repair w mesh 03/25/2014 03/26/2015  . Bipolar disorder, unspecified (HCC) 09/01/2014  . Back muscle spasm 09/01/2014  . Smoking 09/01/2014  . Elevated BP 09/01/2014  . Atrial fibrillation, new 01/13/2013  . Nausea and vomiting in adult 01/13/2013  . Hypertension 01/13/2013   Past Medical History:  Diagnosis Date  . Anxiety   . Back pain, chronic   . Depression    hx of   . Dysrhythmia 2013   hx of a-fib  . Ectopic pregnancy   . GERD (gastroesophageal reflux disease)   . Headache    occasional   . Hypertension    was told, but never on meds  . Infection    UTI  . Ovarian cyst   . Urinary tract infection     Family History  Problem Relation Age of Onset  . Hypertension Mother   .  Hypertension Father   . Cancer Sister   . Heart disease Maternal Grandmother   . Cancer Maternal Grandfather   . Other Neg Hx   . Hearing loss Neg Hx     Past Surgical History:  Procedure Laterality Date  . CLEFT PALATE REPAIR    . etopical surgery    . LAPAROSCOPY FOR ECTOPIC PREGNANCY  2011  . TYMPANOPLASTY Right 08/03/2015   Procedure: RIGHT MEDIAL TECHNIQUE TYMPANOPLASTY;  Surgeon: Flo Shanks, MD;  Location: [facility];  Service: ENT;  Laterality: Right;  . VENTRAL HERNIA REPAIR N/A 03/26/2015   Procedure: LAPAROSCOPIC REPAIR OF INCARCERATED SUPERUMBILICAL HERNIA X2 AND UMBILICAL HERNIA REPAIR WITH MESH;  Surgeon: Karie Soda, MD;   Location: [facility];  Service: General;  Laterality: N/A;   Social History   Occupational History  . Not on file  Tobacco Use  . Smoking status: Former Smoker    Packs/day: 0.25    Years: 15.00    Pack years: 3.75    Types: Cigarettes  . Smokeless tobacco: Never Used  Substance and Sexual Activity  . Alcohol use: Yes    Alcohol/week: 0.6 oz    Types: 1 Standard drinks or equivalent per week    Comment: social  . Drug use: No  . Sexual activity: Yes    Partners: Male    Birth control/protection: None

## 2018-05-16 ENCOUNTER — Telehealth (INDEPENDENT_AMBULATORY_CARE_PROVIDER_SITE_OTHER): Payer: Self-pay | Admitting: Orthopaedic Surgery

## 2018-05-16 NOTE — Telephone Encounter (Signed)
Patient is wondering if since she was allowed to go back to work by Dr. Roda ShuttersXu -  She is saying her job and the ADA wont let patient come back until the forms are filled out by [employer]. Keane Scrapeirsa spoke with patient and told her to explain situation to Dr. Roda ShuttersXu. It is slightly confusing the way she was explaining it but they cut off her disability already and she still feels like it wont help if she gets the forms filled out since Dr. Roda ShuttersXu said she could already go back? Please call patient # [phone]

## 2018-05-16 NOTE — Telephone Encounter (Signed)
I'll fill out her forms today. She just wanted to know if it was ok because she is still out even though the doctor said she could go back.

## 2018-05-16 NOTE — Telephone Encounter (Signed)
So what do I need to do?

## 2018-05-16 NOTE — Telephone Encounter (Signed)
FYI

## 2018-05-16 NOTE — Telephone Encounter (Signed)
See message below °

## 2018-05-18 ENCOUNTER — Telehealth (INDEPENDENT_AMBULATORY_CARE_PROVIDER_SITE_OTHER): Payer: Self-pay | Admitting: Orthopaedic Surgery

## 2018-05-18 NOTE — Telephone Encounter (Signed)
Zertie Jonson w/ [employer] called. He received forms but wants a call to discuss the dates on the form as he thinks there is in error. Callback [phone], ext [phone]

## 2018-05-22 ENCOUNTER — Telehealth (INDEPENDENT_AMBULATORY_CARE_PROVIDER_SITE_OTHER): Payer: Self-pay | Admitting: Orthopaedic Surgery

## 2018-05-22 NOTE — Telephone Encounter (Signed)
[name] W/ [employer] called, received form, however section "C" wasn't completed. Needs that to be completed. Please call back at [phone] ext [phone]

## 2018-05-31 ENCOUNTER — Encounter (INDEPENDENT_AMBULATORY_CARE_PROVIDER_SITE_OTHER): Payer: Self-pay

## 2018-05-31 ENCOUNTER — Telehealth (INDEPENDENT_AMBULATORY_CARE_PROVIDER_SITE_OTHER): Payer: Self-pay | Admitting: Orthopaedic Surgery

## 2018-05-31 NOTE — Telephone Encounter (Signed)
Patient called left voicemail message needing to know a date when she can return to work. The number to contact patient is [phone]

## 2018-05-31 NOTE — Telephone Encounter (Signed)
She can now

## 2018-05-31 NOTE — Telephone Encounter (Signed)
Note made and will mail out to patient. Patient is aware.

## 2018-05-31 NOTE — Telephone Encounter (Signed)
Please advise 

## 2018-06-11 ENCOUNTER — Other Ambulatory Visit: Payer: Self-pay

## 2018-06-11 ENCOUNTER — Emergency Department (HOSPITAL_COMMUNITY): Payer: BLUE CROSS/BLUE SHIELD

## 2018-06-11 ENCOUNTER — Encounter (HOSPITAL_COMMUNITY): Payer: Self-pay

## 2018-06-11 ENCOUNTER — Emergency Department (HOSPITAL_COMMUNITY)
Admission: EM | Admit: 2018-06-11 | Discharge: 2018-06-11 | Disposition: A | Discharge status: Home / Self Care | Payer: BLUE CROSS/BLUE SHIELD | Attending: Emergency Medicine | Admitting: Emergency Medicine

## 2018-06-11 DIAGNOSIS — R1013 Epigastric pain: Secondary | ICD-10-CM | POA: Diagnosis not present

## 2018-06-11 DIAGNOSIS — Z87891 Personal history of nicotine dependence: Secondary | ICD-10-CM | POA: Diagnosis not present

## 2018-06-11 DIAGNOSIS — I10 Essential (primary) hypertension: Secondary | ICD-10-CM | POA: Insufficient documentation

## 2018-06-11 LAB — URINALYSIS, ROUTINE W REFLEX MICROSCOPIC
Bilirubin Urine: NEGATIVE
GLUCOSE, UA: NEGATIVE mg/dL
Hgb urine dipstick: NEGATIVE
Ketones, ur: 20 mg/dL — AB
Leukocytes, UA: NEGATIVE
NITRITE: NEGATIVE
PH: 5 (ref 5.0–8.0)
PROTEIN: NEGATIVE mg/dL
SPECIFIC GRAVITY, URINE: 1.023 (ref 1.005–1.030)

## 2018-06-11 LAB — CBC
HCT: 38.7 % (ref 36.0–46.0)
Hemoglobin: 12.9 g/dL (ref 12.0–15.0)
MCH: 30.9 pg (ref 26.0–34.0)
MCHC: 33.3 g/dL (ref 30.0–36.0)
MCV: 92.8 fL (ref 78.0–100.0)
PLATELETS: 324 10*3/uL (ref 150–400)
RBC: 4.17 MIL/uL (ref 3.87–5.11)
RDW: 14.2 % (ref 11.5–15.5)
WBC: 9.5 10*3/uL (ref 4.0–10.5)

## 2018-06-11 LAB — BASIC METABOLIC PANEL
Anion gap: 6 (ref 5–15)
BUN: 10 mg/dL (ref 6–20)
CO2: 26 mmol/L (ref 22–32)
CREATININE: 0.6 mg/dL (ref 0.44–1.00)
Calcium: 9.3 mg/dL (ref 8.9–10.3)
Chloride: 109 mmol/L (ref 98–111)
GFR calc non Af Amer: >60 mL/min (ref >60)
Glucose, Bld: 84 mg/dL (ref 70–99)
Potassium: 3.3 mmol/L — ABNORMAL LOW (ref 3.5–5.1)
SODIUM: 141 mmol/L (ref 135–145)

## 2018-06-11 LAB — I-STAT BETA HCG BLOOD, ED (MC, WL, AP ONLY)

## 2018-06-11 LAB — I-STAT TROPONIN, ED: Troponin i, poc: 0 ng/mL (ref 0.00–0.08)

## 2018-06-11 LAB — LIPASE, BLOOD: Lipase: 27 U/L (ref 11–51)

## 2018-06-11 MED ORDER — GI COCKTAIL ~~LOC~~
30.0000 mL | Freq: Once | ORAL | Status: AC
  Administered 2018-06-11: 30 mL via ORAL
  Filled 2018-06-11: qty 30

## 2018-06-11 MED ORDER — RANITIDINE HCL 150 MG PO CAPS: 150.0000 mg | ORAL_CAPSULE | Freq: Every day | ORAL | 0 refills | Status: DC

## 2018-06-11 MED ORDER — OMEPRAZOLE 20 MG PO CPDR: 20.0000 mg | DELAYED_RELEASE_CAPSULE | Freq: Every day | ORAL | 0 refills | Status: DC

## 2018-06-11 NOTE — ED Provider Notes (Signed)
Patient placed in Quick Look pathway, seen and evaluated   Chief Complaint: Abdominal pain   HPI:   Patient with history of A. fib presents with 1 week history of intermittent epigastric abdominal pain.  Pain will last for 30 to 45 minutes before resolving and is described as a cramping stabbing pain.  It worsens with deep inspiration but is localized to the epigastric region and does not radiate.  She denies shortness of breath or chest pain.  She does endorse a few episodes of watery nonbloody diarrhea but denies urinary symptoms, fevers, or chills.  ROS: Positive for abdominal pain, diarrhea Negative for fevers, chills, nausea, vomiting, shortness of breath, chest pain  Physical Exam:   Gen: No distress  Neuro: Awake and Alert  Skin: Warm    Focused Exam:Heart regular rate and rhythm, no murmurs rubs or gallops noted.  Lungs clear to auscultation bilaterally.  2+ radial and DP/PT pulses bilaterally.  Bowel sounds active in all 4 quadrants.  No tenderness to palpation of the abdomen.  No CVA tenderness.   Initiation of care has begun. The patient has been counseled on the process, plan, and necessity for staying for the completion/evaluation, and the remainder of the medical screening examination    Bennye AlmFawze, Darvis Croft A, PA-C 06/11/18 1338    Azalia Bilisampos, Kevin, MD 06/12/18 [phone]

## 2018-06-11 NOTE — Discharge Instructions (Addendum)
Please read attached information. If you experience any new or worsening signs or symptoms please return to the emergency room for evaluation. Please follow-up with your primary care provider or specialist as discussed. Please use medication prescribed only as directed and discontinue taking if you have any concerning signs or symptoms.   °

## 2018-06-11 NOTE — ED Triage Notes (Signed)
Pt endorses epigastric pain since last week with intermittent shob and lightheadedness. VSS.

## 2018-06-11 NOTE — ED Provider Notes (Signed)
[facility] EMERGENCY DEPARTMENT Provider Note   CSN: [id number] Arrival date & time: 06/11/18  1313   History   Chief Complaint Chief Complaint  Patient presents with  . Abdominal Pain    HPI [name] is a 37 y.o. female.  HPI    37 year old female with a past medical history A. Fib presents today with complaints of epigastric abdominal pain. Patient notes last week she started developing epigastric abdominal cramping and pain. Patient notes this was worse with movement, worse with palpation and inspiration. She notes symptoms came on more severe and then completely resolved. She notes again symptoms started this morning with croupy sensation in the epigastric region. Patient notes the symptoms are not worse with eating but are worse after large meals. She denies any lower abdominal pain, fever, cough, shortness of breath. She notes several episodes of nonbloody diarrhea, denies any urinary complaints.no medications prior to arrival.    Past Medical History:  Diagnosis Date  . Anxiety   . Back pain, chronic   . Depression    hx of   . Dysrhythmia 2013   hx of a-fib  . Ectopic pregnancy   . GERD (gastroesophageal reflux disease)   . Headache    occasional   . Hypertension    was told, but never on meds  . Infection    UTI  . Ovarian cyst   . Urinary tract infection     Patient Active Problem List   Diagnosis Date Noted  . Closed fracture of right ankle 04/13/2018  . Ileus (HCC) 03/28/2015  . Incarcerated ventral hernia s/p lap repair w mesh 03/25/2014 03/26/2015  . Umbilical hernia s/p lap repair w mesh 03/25/2014 03/26/2015  . Bipolar disorder, unspecified (HCC) 09/01/2014  . Back muscle spasm 09/01/2014  . Smoking 09/01/2014  . Elevated BP 09/01/2014  . Atrial fibrillation, new 01/13/2013  . Nausea and vomiting in adult 01/13/2013  . Hypertension 01/13/2013    Past Surgical History:  Procedure Laterality Date  . CLEFT PALATE  REPAIR    . etopical surgery    . LAPAROSCOPY FOR ECTOPIC PREGNANCY  2011  . TYMPANOPLASTY Right 08/03/2015   Procedure: RIGHT MEDIAL TECHNIQUE TYMPANOPLASTY;  Surgeon: Flo Shanks, MD;  Location: [facility];  Service: ENT;  Laterality: Right;  . VENTRAL HERNIA REPAIR N/A 03/26/2015   Procedure: LAPAROSCOPIC REPAIR OF INCARCERATED SUPERUMBILICAL HERNIA X2 AND UMBILICAL HERNIA REPAIR WITH MESH;  Surgeon: Karie Soda, MD;  Location: [facility];  Service: General;  Laterality: N/A;     OB History    Gravida  2   Para      Term      Preterm      AB  2   Living  0     SAB  1   TAB  0   Ectopic  1   Multiple  0   Live Births               Home Medications    Prior to Admission medications   Medication Sig Start Date End Date Taking? Authorizing Provider  HYDROcodone-acetaminophen (NORCO) 5-325 MG tablet Take 1 tablet by mouth 3 (three) times daily as needed for moderate pain. Patient not taking: Reported on 05/09/2018 04/13/18   Cristie Hem, PA-C  HYDROcodone-acetaminophen (NORCO) 7.5-325 MG tablet Take 1 tablet by mouth every 6 (six) hours as needed for moderate pain. Patient not taking: Reported on 05/09/2018 04/08/18   Eustace Moore, MD  ibuprofen (ADVIL,MOTRIN) 600 MG tablet Take 1 tablet (600 mg total) by mouth every 6 (six) hours as needed. Patient not taking: Reported on 05/09/2018 08/11/17   Bethel BornGekas, Kelly Marie, PA-C  meloxicam (MOBIC) 7.5 MG tablet Take 1 tablet (7.5 mg total) by mouth daily as needed for pain. Patient not taking: Reported on 05/09/2018 04/13/18   Cristie HemStanbery, Mary L, PA-C  omeprazole (PRILOSEC) 20 MG capsule Take 1 capsule (20 mg total) by mouth daily. 06/11/18   Eleanore Junio, Tinnie GensJeffrey, PA-C  ranitidine (ZANTAC) 150 MG capsule Take 1 capsule (150 mg total) by mouth daily. 06/11/18   Eyvonne MechanicHedges, Cynda Soule, PA-C    Family History Family History  Problem Relation Age of Onset  . Hypertension Mother   . Hypertension Father   . Cancer Sister   . Heart  disease Maternal Grandmother   . Cancer Maternal Grandfather   . Other Neg Hx   . Hearing loss Neg Hx     Social History Social History   Tobacco Use  . Smoking status: Former Smoker    Packs/day: 0.25    Years: 15.00    Pack years: 3.75    Types: Cigarettes  . Smokeless tobacco: Never Used  Substance Use Topics  . Alcohol use: Yes    Alcohol/week: 0.6 oz    Types: 1 Standard drinks or equivalent per week    Comment: social  . Drug use: No     Allergies   Flexeril [cyclobenzaprine]   Review of Systems Review of Systems  All other systems reviewed and are negative.    Physical Exam Updated Vital Signs BP 123/86 (BP Location: Left Arm)   Pulse 75   Temp 98.3 F (36.8 C) (Oral)   Resp 18   Ht 5' (1.524 m)   Wt 58.1 kg (128 lb)   SpO2 100%   BMI 25.00 kg/m   Physical Exam  Constitutional: She is oriented to person, place, and time. She appears well-developed and well-nourished.  HENT:  Head: Normocephalic and atraumatic.  Eyes: Pupils are equal, round, and reactive to light. Conjunctivae are normal. Right eye exhibits no discharge. Left eye exhibits no discharge. No scleral icterus.  Neck: Normal range of motion. No JVD present. No tracheal deviation present.  Pulmonary/Chest: Effort normal. No stridor.  Abdominal:  Minor tenderness to palpation of the epigastric region, no right upper quadrant tenderness, remainder abdomen soft nontender  Neurological: She is alert and oriented to person, place, and time. Coordination normal.  Psychiatric: She has a normal mood and affect. Her behavior is normal. Judgment and thought content normal.  Nursing note and vitals reviewed.    ED Treatments / Results  Labs (all labs ordered are listed, but only abnormal results are displayed) Labs Reviewed  BASIC METABOLIC PANEL - Abnormal; Notable for the following components:      Result Value   Potassium 3.3 (*)    All other components within normal limits  URINALYSIS,  ROUTINE W REFLEX MICROSCOPIC - Abnormal; Notable for the following components:   APPearance HAZY (*)    Ketones, ur 20 (*)    All other components within normal limits  CBC  LIPASE, BLOOD  I-STAT TROPONIN, ED  I-STAT BETA HCG BLOOD, ED ([facility], [facility], [facility] ONLY)    EKG None  Radiology Dg Chest 2 View  Result Date: 06/11/2018 CLINICAL DATA:  Epigastric pain for 1 week, history hypertension EXAM: CHEST - 2 VIEW COMPARISON:  01/16/2016 FINDINGS: Normal heart size and pulmonary vascularity. RIGHT side aortic arch. Lungs  clear. No pulmonary infiltrate, pleural effusion, or pneumothorax. Osseous structures unremarkable. IMPRESSION: No acute abnormalities. RIGHT side aortic arch. Electronically Signed   By: Ulyses Southward M.D.   On: 06/11/2018 14:32    Procedures Procedures (including critical care time)  Medications Ordered in ED Medications  gi cocktail (Maalox,Lidocaine,Donnatal) (30 mLs Oral Given 06/11/18 1534)     Initial Impression / Assessment and Plan / ED Course  I have reviewed the triage vital signs and the nursing notes.  Pertinent labs & imaging results that were available during my care of the patient were reviewed by me and considered in my medical decision making (see chart for details).     Labs: I stat beta hcg, I stat trop, ua, bmp, cbc, lipase  Imaging: DG chest 2 view  Consults:  Therapeutics:  Discharge Meds: Zantac, Prilosec  Assessment/Plan: 37 year old female presents today with likely gastritis. Patient having intermittent pain in her epigastric region, no right upper quadrant pain. Reassuring laboratory analysis. Improvement with GI cocktail. Discharged home with Prilosec, Zantac. Patient given strict return precautions, she verbalized understanding and agreement to today's plan had no further questions concerns.   Final Clinical Impressions(s) / ED Diagnoses   Final diagnoses:  Epigastric abdominal pain    ED Discharge Orders        Ordered    omeprazole  (PRILOSEC) 20 MG capsule  Daily     06/11/18 1625    ranitidine (ZANTAC) 150 MG capsule  Daily     06/11/18 1625       Eyvonne Mechanic, PA-C 06/11/18 1906    Vanetta Mulders, MD 06/13/18 [phone]

## 2018-07-09 ENCOUNTER — Telehealth (INDEPENDENT_AMBULATORY_CARE_PROVIDER_SITE_OTHER): Payer: Self-pay | Admitting: Physician Assistant

## 2018-07-09 NOTE — Telephone Encounter (Signed)
Patient called said her right ankle is swollen again. Patient asked is she can get something for the pain and inflammation. The number to contact patient is [phone]

## 2018-07-09 NOTE — Telephone Encounter (Signed)
Please advise,

## 2018-07-09 NOTE — Telephone Encounter (Signed)
Ok to send in mobic 7.5 once daily #30 and tramadol (normal dose)

## 2018-07-10 ENCOUNTER — Other Ambulatory Visit (INDEPENDENT_AMBULATORY_CARE_PROVIDER_SITE_OTHER): Payer: Self-pay

## 2018-07-10 MED ORDER — MELOXICAM 7.5 MG PO TABS: ORAL_TABLET | ORAL | 0 refills | Status: DC

## 2018-07-10 MED ORDER — TRAMADOL HCL 50 MG PO TABS: ORAL_TABLET | ORAL | 0 refills | Status: DC

## 2018-07-10 MED FILL — traMADol HCL 50 MG TABS: 50 | 4 days supply | Qty: 30 | Fill #0

## 2018-07-10 MED FILL — MELOXICAM 7.5 MG TABLET: 7.5 | 30 days supply | Qty: 30 | Fill #0

## 2018-07-10 NOTE — Telephone Encounter (Signed)
Rx were sent to pharm

## 2018-07-10 NOTE — Telephone Encounter (Signed)
LMOM

## 2018-07-10 NOTE — Telephone Encounter (Signed)
Patient is [facility]. Patients # [phone] [phone]

## 2018-07-10 NOTE — Telephone Encounter (Signed)
CALLED RX INTO PHARM  

## 2018-07-17 ENCOUNTER — Other Ambulatory Visit: Payer: Self-pay

## 2018-07-17 ENCOUNTER — Ambulatory Visit (INDEPENDENT_AMBULATORY_CARE_PROVIDER_SITE_OTHER): Payer: BLUE CROSS/BLUE SHIELD | Admitting: Family Medicine

## 2018-07-17 ENCOUNTER — Encounter: Payer: Self-pay | Admitting: Family Medicine

## 2018-07-17 VITALS — BP 102/80 | HR 85 | Temp 98.3°F | Ht 63.39 in | Wt 130.2 lb

## 2018-07-17 DIAGNOSIS — M545 Low back pain, unspecified: Secondary | ICD-10-CM

## 2018-07-17 MED ORDER — METHOCARBAMOL 500 MG PO TABS: 500.0000 mg | ORAL_TABLET | Freq: Three times a day (TID) | ORAL | 0 refills | Status: DC | PRN

## 2018-07-17 MED ORDER — IBUPROFEN 600 MG PO TABS: 600.0000 mg | ORAL_TABLET | Freq: Three times a day (TID) | ORAL | 0 refills | Status: DC | PRN

## 2018-07-17 NOTE — Progress Notes (Signed)
8/13/20198:35 AM  Breanna FogoBriget O Pinedo [birth year], 37 y.o. female [id number]  Chief Complaint  Patient presents with  . Pain    due to work, lots of lifting that causes pain in the back and knees. Pain has been constant for about a wk. Pain scale at an 8. Dx with chronic back pain today    HPI:   Patient is a 37 y.o. 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 No No No No No     [facility] 2/9 07/17/2018 02/23/2018 12/22/2017  Decreased Interest 0 0 0  Down, Depressed, Hopeless 0 0 0  PHQ - 2 Score 0 0 0    Allergies  Allergen Reactions  . Flexeril [Cyclobenzaprine] Hives    Prior to Admission medications   Medication Sig Start Date End Date Taking? Authorizing Provider    Past Medical History:  Diagnosis Date  . Anxiety   . Back pain, chronic   . Depression    hx of   . Dysrhythmia 2013   hx of a-fib  . Ectopic pregnancy   . GERD (gastroesophageal reflux disease)   . Headache    occasional   . Hypertension    was told, but never on meds  . Infection    UTI  . Ovarian cyst   . Urinary tract infection     Past Surgical History:  Procedure Laterality Date  . CLEFT PALATE REPAIR    . etopical surgery    . LAPAROSCOPY FOR ECTOPIC PREGNANCY  2011  . TYMPANOPLASTY Right 08/03/2015   Procedure: RIGHT MEDIAL TECHNIQUE TYMPANOPLASTY;  Surgeon: Flo ShanksKarol Wolicki,  MD;  Location: [facility];  Service: ENT;  Laterality: Right;  . VENTRAL HERNIA REPAIR N/A 03/26/2015   Procedure: LAPAROSCOPIC REPAIR OF INCARCERATED SUPERUMBILICAL HERNIA X2 AND UMBILICAL HERNIA REPAIR WITH MESH;  Surgeon: Karie SodaSteven Gross, MD;  Location: [facility];  Service: General;  Laterality: N/A;    Social History   Tobacco Use  . Smoking status: Former Smoker    Packs/day: 0.25    Years: 15.00    Pack years: 3.75    Types: Cigarettes  . Smokeless tobacco: Never Used  Substance Use Topics  . Alcohol use: Yes    Alcohol/week: 1.0 standard drinks    Types: 1 Standard drinks or equivalent per week    Comment: social    Family History  Problem Relation Age of Onset  . Hypertension Mother   . Hypertension Father   . Cancer Sister   . Heart disease Maternal Grandmother   . Cancer Maternal Grandfather   . Other Neg Hx   . Hearing loss Neg Hx     ROS Per hpi  OBJECTIVE:  Blood pressure 102/80, pulse 85, temperature 98.3 F (36.8 C), temperature source Oral, height 5' 3.39" (1.61 m), weight 130 lb 3.2 oz (  59.1 kg), SpO2 100 %. Body mass index is 22.78 kg/m.   Physical Exam  Constitutional: She appears well-developed and well-nourished.  Musculoskeletal:       Lumbar back: She exhibits tenderness (paraspinal both sides with spasms) and spasm. She exhibits normal range of motion and no bony tenderness.  Neurological: She has normal strength and normal reflexes. Gait normal.  Neg SLR  Nursing note and vitals reviewed.   ASSESSMENT and PLAN  1. Acute bilateral low back pain without sciatica Discussed supportive measures, new meds r/se/b and RTC precautions. Patient educational handout given.  Other orders - ibuprofen (ADVIL,MOTRIN) 600 MG tablet; Take 1 tablet (600 mg total) by mouth every 8 (eight) hours as needed. - methocarbamol (ROBAXIN) 500 MG tablet; Take 1 tablet (500 mg total) by mouth every 8 (eight) hours as needed for muscle spasms.  Return if  symptoms worsen or fail to improve.    Myles LippsIrma M Santiago, MD [facility] [address] [place], [place] [address] Ph.  [phone] Fax [phone]

## 2018-07-17 NOTE — Patient Instructions (Signed)
     IF you received an x-ray today, you will receive an invoice from [facility]. Please contact [facility] at [phone] with questions or concerns regarding your invoice.   IF you received labwork today, you will receive an invoice from [facility]. Please contact [facility] at [phone] with questions or concerns regarding your invoice.   Our billing staff will not be able to assist you with questions regarding bills from these companies.  You will be contacted with the lab results as soon as they are available. The fastest way to get your results is to activate your My Chart account. Instructions are located on the last page of this paperwork. If you have not heard from us regarding the results in 2 weeks, please contact this office.     

## 2018-08-07 ENCOUNTER — Telehealth: Payer: Self-pay | Admitting: Family Medicine

## 2018-08-07 NOTE — Telephone Encounter (Signed)
Copied from CRM [id number]. Topic: Quick Communication - See Telephone Encounter >> Aug 07, 2018  2:01 PM Raquel Sarna wrote:  ibuprofen (ADVIL,MOTRIN) 600 MG tablet   methocarbamol (ROBAXIN) 500 MG tablet  Needing the Rx's called into:   [facility] [place], [place] - [address] [facility]. [address] [place] [address] Phone: [phone] Fax: [phone]

## 2018-08-07 NOTE — Telephone Encounter (Signed)
Copied from CRM [id number]. Topic: Quick Communication - See Telephone Encounter >> Aug 07, 2018  2:03 PM Raquel Sarna wrote: Pt wantst to stop smoking.  Pt wants to know if Dr. Leretha Pol would prescribe Chantix?

## 2018-08-07 NOTE — Telephone Encounter (Signed)
Pt called and pt states that she thought that the medications were sent to [facility] and wanted the medications to be switched to [facility]. Pt notified that requested prescriptions were sent to [facility] on 07/17/18.  Pt states she did not know that the medications were to [facility] and she will contact the pharmacy to see when the medications would be available for refill.

## 2018-08-09 MED FILL — METHOCARBAMOL 500 MG TABS: 500 | 10 days supply | Qty: 30 | Fill #0

## 2018-08-09 MED FILL — IBUPROFEN 600 MG TABLET: 600 | 10 days supply | Qty: 30 | Fill #0

## 2018-08-10 NOTE — Telephone Encounter (Signed)
Please advise. Dgaddy, CMA 

## 2018-08-13 NOTE — Telephone Encounter (Signed)
I am happy to prescribed chantix but we

## 2018-08-13 NOTE — Telephone Encounter (Signed)
Please schedule patient to discuss smoking Per my last notes she was not smoking anymore thanks

## 2018-08-14 ENCOUNTER — Encounter: Payer: Self-pay | Admitting: Family Medicine

## 2018-08-24 MED FILL — MELOXICAM 15 MG TABLET: 15 | 30 days supply | Qty: 30 | Fill #0

## 2018-08-29 ENCOUNTER — Ambulatory Visit: Payer: BLUE CROSS/BLUE SHIELD | Admitting: Family Medicine

## 2018-11-14 ENCOUNTER — Encounter (INDEPENDENT_AMBULATORY_CARE_PROVIDER_SITE_OTHER): Payer: Self-pay | Admitting: Orthopaedic Surgery

## 2018-11-14 ENCOUNTER — Ambulatory Visit (INDEPENDENT_AMBULATORY_CARE_PROVIDER_SITE_OTHER): Payer: Self-pay

## 2018-11-14 ENCOUNTER — Ambulatory Visit (INDEPENDENT_AMBULATORY_CARE_PROVIDER_SITE_OTHER): Payer: BLUE CROSS/BLUE SHIELD | Admitting: Orthopaedic Surgery

## 2018-11-14 DIAGNOSIS — M25521 Pain in right elbow: Secondary | ICD-10-CM

## 2018-11-14 DIAGNOSIS — G8929 Other chronic pain: Secondary | ICD-10-CM

## 2018-11-14 DIAGNOSIS — M25562 Pain in left knee: Secondary | ICD-10-CM | POA: Diagnosis not present

## 2018-11-14 MED ORDER — LIDOCAINE HCL 1 % IJ SOLN
2.0000 mL | INTRAMUSCULAR | Status: AC | PRN
  Administered 2018-11-14: 2 mL

## 2018-11-14 MED ORDER — METHYLPREDNISOLONE ACETATE 40 MG/ML IJ SUSP
40.0000 mg | INTRAMUSCULAR | Status: AC | PRN
Start: 2018-11-14 — End: 2018-11-14
  Administered 2018-11-14: 40 mg via INTRA_ARTICULAR

## 2018-11-14 MED ORDER — BUPIVACAINE HCL 0.5 % IJ SOLN
2.0000 mL | INTRAMUSCULAR | Status: AC | PRN
  Administered 2018-11-14: 2 mL via INTRA_ARTICULAR

## 2018-11-14 NOTE — Progress Notes (Signed)
Office Visit Note   Patient: [name]           Date of Birth: February 25, 1981           MRN: [id number] Visit Date: 11/14/2018              Requested by: No referring provider defined for this encounter. PCP: System, Provider Not In   Assessment & Plan: Visit Diagnoses:  1. Pain in right elbow   2. Chronic pain of left knee     Plan: Impression is right elbow medial epicondylitis and left knee arthritis exacerbation.  To her elbow we discussed activity modification and rest and stretches at home that I demonstrated.  NSAIDs as needed.  For the knee we performed cortisone injection today.  She will continue to treat this symptomatically.  Questions encouraged and answered.  Follow-up as needed.  Follow-Up Instructions: Return if symptoms worsen or fail to improve.   Orders:  Orders Placed This Encounter  Procedures  . XR Elbow Complete Right (3+View)  . XR KNEE 3 VIEW LEFT   No orders of the defined types were placed in this encounter.     Procedures: Large Joint Inj: L knee on 11/14/2018 3:13 PM Details: 22 G needle Medications: 2 mL bupivacaine 0.5 %; 2 mL lidocaine 1 %; 40 mg methylPREDNISolone acetate 40 MG/ML Outcome: tolerated well, no immediate complications Patient was prepped and draped in the usual sterile fashion.       Clinical Data: No additional findings.   Subjective: Chief Complaint  Patient presents with  . Left Knee - Pain  . Right Elbow - Pain    [name] comes in today for evaluation of 2 separate issues.  #1 she has medial right elbow pain that is worse with use of a mop.  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  She states that she is frequently hitting her elbow up against hard surfaces.  She denies any numbness and tingling.  She states that it hurts to put pressure directly on the medial epicondyle.  There is no popping in her elbow.  #2 is her left knee pain that has been chronic and worse with prolonged sitting and standing or  walking.  She also endorses stiffness and it feels like it wants to give out.  She has been taking meloxicam and Voltaren gel without significant relief.  Denies any mechanical symptoms denies any swelling.   Review of Systems  Constitutional: Negative.   HENT: Negative.   Eyes: Negative.   Respiratory: Negative.   Cardiovascular: Negative.   Endocrine: Negative.   Musculoskeletal: Negative.   Neurological: Negative.   Hematological: Negative.   Psychiatric/Behavioral: Negative.   All other systems reviewed and are negative.    Objective: Vital Signs: There were no vitals taken for this visit.  Physical Exam  Constitutional: She is oriented to person, place, and time. She appears well-developed and well-nourished.  Pulmonary/Chest: Effort normal.  Neurological: She is alert and oriented to person, place, and time.  Skin: Skin is warm. Capillary refill takes less than 2 seconds.  Psychiatric: She has a normal mood and affect. Her behavior is normal. Judgment and thought content normal.  Nursing note and vitals reviewed.   Ortho Exam Right elbow exam shows tenderness along the medial epicondyle.  Ulnar nerve is stable.  Negative Tinel's at cubital tunnel.  Mildly uncomfortable with palpation of the flexor pronator muscle. Left knee exam shows no joint effusion.  She has good range of  motion.  Collaterals and cruciates are stable.  1+ patellofemoral crepitus. Specialty Comments:  No specialty comments available.  Imaging: Xr Elbow Complete Right (3+view)  Result Date: 11/14/2018 No acute or structural abnormalities.  Xr Knee 3 View Left  Result Date: 11/14/2018 No acute or structural abnormalities.    PMFS History: Patient Active Problem List   Diagnosis Date Noted  . Closed fracture of right ankle 04/13/2018  . Ileus (HCC) 03/28/2015  . Incarcerated ventral hernia s/p lap repair w mesh 03/25/2014 03/26/2015  . Umbilical hernia s/p lap repair w mesh 03/25/2014  03/26/2015  . Bipolar disorder, unspecified (HCC) 09/01/2014  . Back muscle spasm 09/01/2014  . Smoking 09/01/2014  . Elevated BP 09/01/2014  . Atrial fibrillation, new 01/13/2013  . Nausea and vomiting in adult 01/13/2013  . Hypertension 01/13/2013   Past Medical History:  Diagnosis Date  . Anxiety   . Back pain, chronic   . Depression    hx of   . Dysrhythmia 2013   hx of a-fib  . Ectopic pregnancy   . GERD (gastroesophageal reflux disease)   . Headache    occasional   . Hypertension    was told, but never on meds  . Infection    UTI  . Ovarian cyst   . Urinary tract infection     Family History  Problem Relation Age of Onset  . Hypertension Mother   . Hypertension Father   . Cancer Sister   . Heart disease Maternal Grandmother   . Cancer Maternal Grandfather   . Other Neg Hx   . Hearing loss Neg Hx     Past Surgical History:  Procedure Laterality Date  . CLEFT PALATE REPAIR    . etopical surgery    . LAPAROSCOPY FOR ECTOPIC PREGNANCY  2011  . TYMPANOPLASTY Right 08/03/2015   Procedure: RIGHT MEDIAL TECHNIQUE TYMPANOPLASTY;  Surgeon: Flo Shanks, MD;  Location: [facility];  Service: ENT;  Laterality: Right;  . VENTRAL HERNIA REPAIR N/A 03/26/2015   Procedure: LAPAROSCOPIC REPAIR OF INCARCERATED SUPERUMBILICAL HERNIA X2 AND UMBILICAL HERNIA REPAIR WITH MESH;  Surgeon: Karie Soda, MD;  Location: [facility];  Service: General;  Laterality: N/A;   Social History   Occupational History  . Not on file  Tobacco Use  . Smoking status: Former Smoker    Packs/day: 0.25    Years: 15.00    Pack years: 3.75    Types: Cigarettes  . Smokeless tobacco: Never Used  Substance and Sexual Activity  . Alcohol use: Yes    Alcohol/week: 1.0 standard drinks    Types: 1 Standard drinks or equivalent per week    Comment: social  . Drug use: No  . Sexual activity: Yes    Partners: Male    Birth control/protection: None

## 2018-11-15 ENCOUNTER — Telehealth (INDEPENDENT_AMBULATORY_CARE_PROVIDER_SITE_OTHER): Payer: Self-pay

## 2018-11-15 NOTE — Telephone Encounter (Signed)
Called to advise.  Patient aware.

## 2018-11-15 NOTE — Telephone Encounter (Signed)
Yes thanks. You're right it's normal to have a flare up.  Rest, ice, advil, elevate.

## 2018-11-15 NOTE — Telephone Encounter (Signed)
See message.

## 2018-11-15 NOTE — Telephone Encounter (Signed)
Pt called and states that she was in the office yesterday and received a cortisone injection. Since the injection she has had an increase in pain. I did relay to the pt that this is not uncommon but that I would forward the information to you to see if there was anything additional that you could advise.

## 2019-05-07 ENCOUNTER — Other Ambulatory Visit: Payer: Self-pay

## 2019-05-07 ENCOUNTER — Encounter (HOSPITAL_COMMUNITY): Payer: Self-pay | Admitting: Emergency Medicine

## 2019-05-07 ENCOUNTER — Ambulatory Visit (HOSPITAL_COMMUNITY)
Admission: EM | Admit: 2019-05-07 | Discharge: 2019-05-07 | Disposition: A | Discharge status: Home / Self Care | Payer: BLUE CROSS/BLUE SHIELD | Attending: Family Medicine | Admitting: Family Medicine

## 2019-05-07 DIAGNOSIS — M7711 Lateral epicondylitis, right elbow: Secondary | ICD-10-CM | POA: Diagnosis not present

## 2019-05-07 DIAGNOSIS — M79644 Pain in right finger(s): Secondary | ICD-10-CM

## 2019-05-07 MED ORDER — DICLOFENAC SODIUM 75 MG PO TBEC: 75.0000 mg | DELAYED_RELEASE_TABLET | Freq: Two times a day (BID) | ORAL | 0 refills | Status: DC

## 2019-05-07 MED FILL — DICLOFENAC SODIUM 75 MG TAB: 75 | 7 days supply | Qty: 14 | Fill #0

## 2019-05-07 NOTE — ED Triage Notes (Signed)
Pt presents to [facility] for assessment of stiffness and swelling to index finger of right hand "for a while".  Pt also c/o right elbow pain, which she bumps all the time, but she states this time the pain is more intense and is not getting better.

## 2019-05-07 NOTE — ED Provider Notes (Signed)
[facility]   [id number] 05/07/19 Arrival Time: 1632  ASSESSMENT & PLAN:  1. Lateral epicondylitis of right elbow   2. Pain in finger of right hand     Meds ordered this encounter  Medications  . diclofenac (VOLTAREN) 75 MG EC tablet    Sig: Take 1 tablet (75 mg total) by mouth 2 (two) times daily.    Dispense:  14 tablet    Refill:  0   Tennis elbow brace applied. Activities as tolerated.  Follow-up Information    [facility].   Why:  If not improving over the next week. Contact information: [address] [place] [id number] [phone]          Reviewed expectations re: course of current medical issues. Questions answered. Outlined signs and symptoms indicating need for more acute intervention. Patient verbalized understanding. After Visit Summary given.  SUBJECTIVE: History from: patient. [name] is a 38 y.o. female who reports fairly persistent mild to moderate pain of her right elbow; described as aching and and sore if I touch it on the outside; without radiation. Onset: gradual, at least one month ago. Injury/trama: no. Uses her arms a lot at work. Symptoms have progressed to a point and plateaued since beginning. Aggravating factors: overuse. Alleviating factors: rest. Associated symptoms: none reported. Extremity sensation changes or weakness: none. Self treatment: has not tried OTCs for relief of pain. History of similar: no.  Also reports noticing R 2nd finger soreness. Mild swelling. No injury. Moving OK. No extremity sensation changes or weakness.  Past Surgical History:  Procedure Laterality Date  . CLEFT PALATE REPAIR    . etopical surgery    . LAPAROSCOPY FOR ECTOPIC PREGNANCY  2011  . TYMPANOPLASTY Right 08/03/2015   Procedure: RIGHT MEDIAL TECHNIQUE TYMPANOPLASTY;  Surgeon: Flo ShanksKarol Wolicki, MD;  Location: [facility];  Service: ENT;  Laterality: Right;  .  VENTRAL HERNIA REPAIR N/A 03/26/2015   Procedure: LAPAROSCOPIC REPAIR OF INCARCERATED SUPERUMBILICAL HERNIA X2 AND UMBILICAL HERNIA REPAIR WITH MESH;  Surgeon: Karie SodaSteven Gross, MD;  Location: [facility];  Service: General;  Laterality: N/A;     ROS: As per HPI. All other systems negative.    OBJECTIVE:  Vitals:   05/07/19 1658  BP: 139/87  Pulse: 89  Resp: 18  Temp: 98.6 F (37 C)  TempSrc: Oral  SpO2: 99%    General appearance: alert; no distress HEENT: [place]; AT Neck: supple with FROM Resp: unlabored respirations Extremities: . R elbow: warm and well perfused; well localized moderate tenderness over right lateral eipcondyle; without gross deformities; with no swelling; with no bruising; ROM: normal with reported discomfort . R 2nd finger: mild TTP over PIP joint; no swelling or bruising; FROM Skin: warm and dry; no visible rashes Neurologic: gait normal; normal reflexes of RUE and LUE; normal sensation of RUE and LUE; normal strength of RUE and LUE Psychological: alert and cooperative; normal mood and affect   Allergies  Allergen Reactions  . Flexeril [Cyclobenzaprine] Hives    Past Medical History:  Diagnosis Date  . Anxiety   . Back pain, chronic   . Depression    hx of   . Dysrhythmia 2013   hx of a-fib  . Ectopic pregnancy   . GERD (gastroesophageal reflux disease)   . Headache    occasional   . Hypertension    was told, but never on meds  . Infection    UTI  .  Ovarian cyst   . Urinary tract infection    Social History   Socioeconomic History  . Marital status: Single    Spouse name: Not on file  . Number of children: Not on file  . Years of education: Not on file  . Highest education level: Not on file  Occupational History  . Not on file  Social Needs  . Financial resource strain: Not on file  . Food insecurity:    Worry: Not on file    Inability: Not on file  . Transportation needs:    Medical: Not on file    Non-medical: Not on file  Tobacco Use   . Smoking status: Former Smoker    Packs/day: 0.25    Years: 15.00    Pack years: 3.75    Types: Cigarettes  . Smokeless tobacco: Never Used  Substance and Sexual Activity  . Alcohol use: Yes    Alcohol/week: 1.0 standard drinks    Types: 1 Standard drinks or equivalent per week    Comment: social  . Drug use: No  . Sexual activity: Yes    Partners: Male    Birth control/protection: None  Lifestyle  . Physical activity:    Days per week: Not on file    Minutes per session: Not on file  . Stress: Not on file  Relationships  . Social connections:    Talks on phone: Not on file    Gets together: Not on file    Attends religious service: Not on file    Active member of club or organization: Not on file    Attends meetings of clubs or organizations: Not on file    Relationship status: Not on file  Other Topics Concern  . Not on file  Social History Narrative  . Not on file   Family History  Problem Relation Age of Onset  . Hypertension Mother   . Hypertension Father   . Cancer Sister   . Heart disease Maternal Grandmother   . Cancer Maternal Grandfather   . Other Neg Hx   . Hearing loss Neg Hx    Past Surgical History:  Procedure Laterality Date  . CLEFT PALATE REPAIR    . etopical surgery    . LAPAROSCOPY FOR ECTOPIC PREGNANCY  2011  . TYMPANOPLASTY Right 08/03/2015   Procedure: RIGHT MEDIAL TECHNIQUE TYMPANOPLASTY;  Surgeon: Flo Shanks, MD;  Location: [facility];  Service: ENT;  Laterality: Right;  . VENTRAL HERNIA REPAIR N/A 03/26/2015   Procedure: LAPAROSCOPIC REPAIR OF INCARCERATED SUPERUMBILICAL HERNIA X2 AND UMBILICAL HERNIA REPAIR WITH MESH;  Surgeon: Karie Soda, MD;  Location: [facility];  Service: General;  Laterality: Vertis Kelch, MD 05/08/19 [phone]

## 2019-05-21 MED FILL — DICLOFENAC SODIUM 75 MG TAB: 75 | 7 days supply | Qty: 14 | Fill #0

## 2019-05-31 ENCOUNTER — Ambulatory Visit: Payer: BLUE CROSS/BLUE SHIELD | Admitting: Family Medicine

## 2019-05-31 ENCOUNTER — Other Ambulatory Visit: Payer: Self-pay

## 2019-05-31 VITALS — BP 120/88 | Ht 60.0 in | Wt 136.0 lb

## 2019-05-31 DIAGNOSIS — M79644 Pain in right finger(s): Secondary | ICD-10-CM | POA: Diagnosis not present

## 2019-05-31 DIAGNOSIS — M79605 Pain in left leg: Secondary | ICD-10-CM | POA: Diagnosis not present

## 2019-05-31 NOTE — Patient Instructions (Signed)
Your pain is due to arthritis in your finger. These are the different medications you can take for this: Tylenol 500mg  1-2 tabs three times a day for pain. Capsaicin, aspercreme, or biofreeze topically up to four times a day may also help with pain. Some supplements that may help for arthritis: Boswellia extract, curcumin, pycnogenol Aleve 1-2 tabs twice a day with food as needed for pain and inflammation. Cortisone injections are an option. Consider buddy taping fingers for support. Heat or ice 15 minutes at a time 3-4 times a day as needed to help with pain. Follow up with me in 5-6 weeks.  You have an overuse calf strain/spasms. Compression sleeve or ace wrap when up and walking around. Heat or Icing for 15 minutes at a time 3-4 times a day Tylenol and/or aleve for pain. Do home exercises 3 sets of 10 once a day; stretches hold for 20-30 seconds, repeat 3 times. Follow up with me in 5-6 weeks.

## 2019-06-03 ENCOUNTER — Encounter: Payer: Self-pay | Admitting: Family Medicine

## 2019-06-03 IMAGING — CR DG CHEST 2V
2 series · 2 of 2 positions shown · non-contrast
Comparison: 01/16/2016

CLINICAL DATA: Epigastric pain for 1 week, history hypertension

EXAM:
CHEST - 2 VIEW

[chest pa]
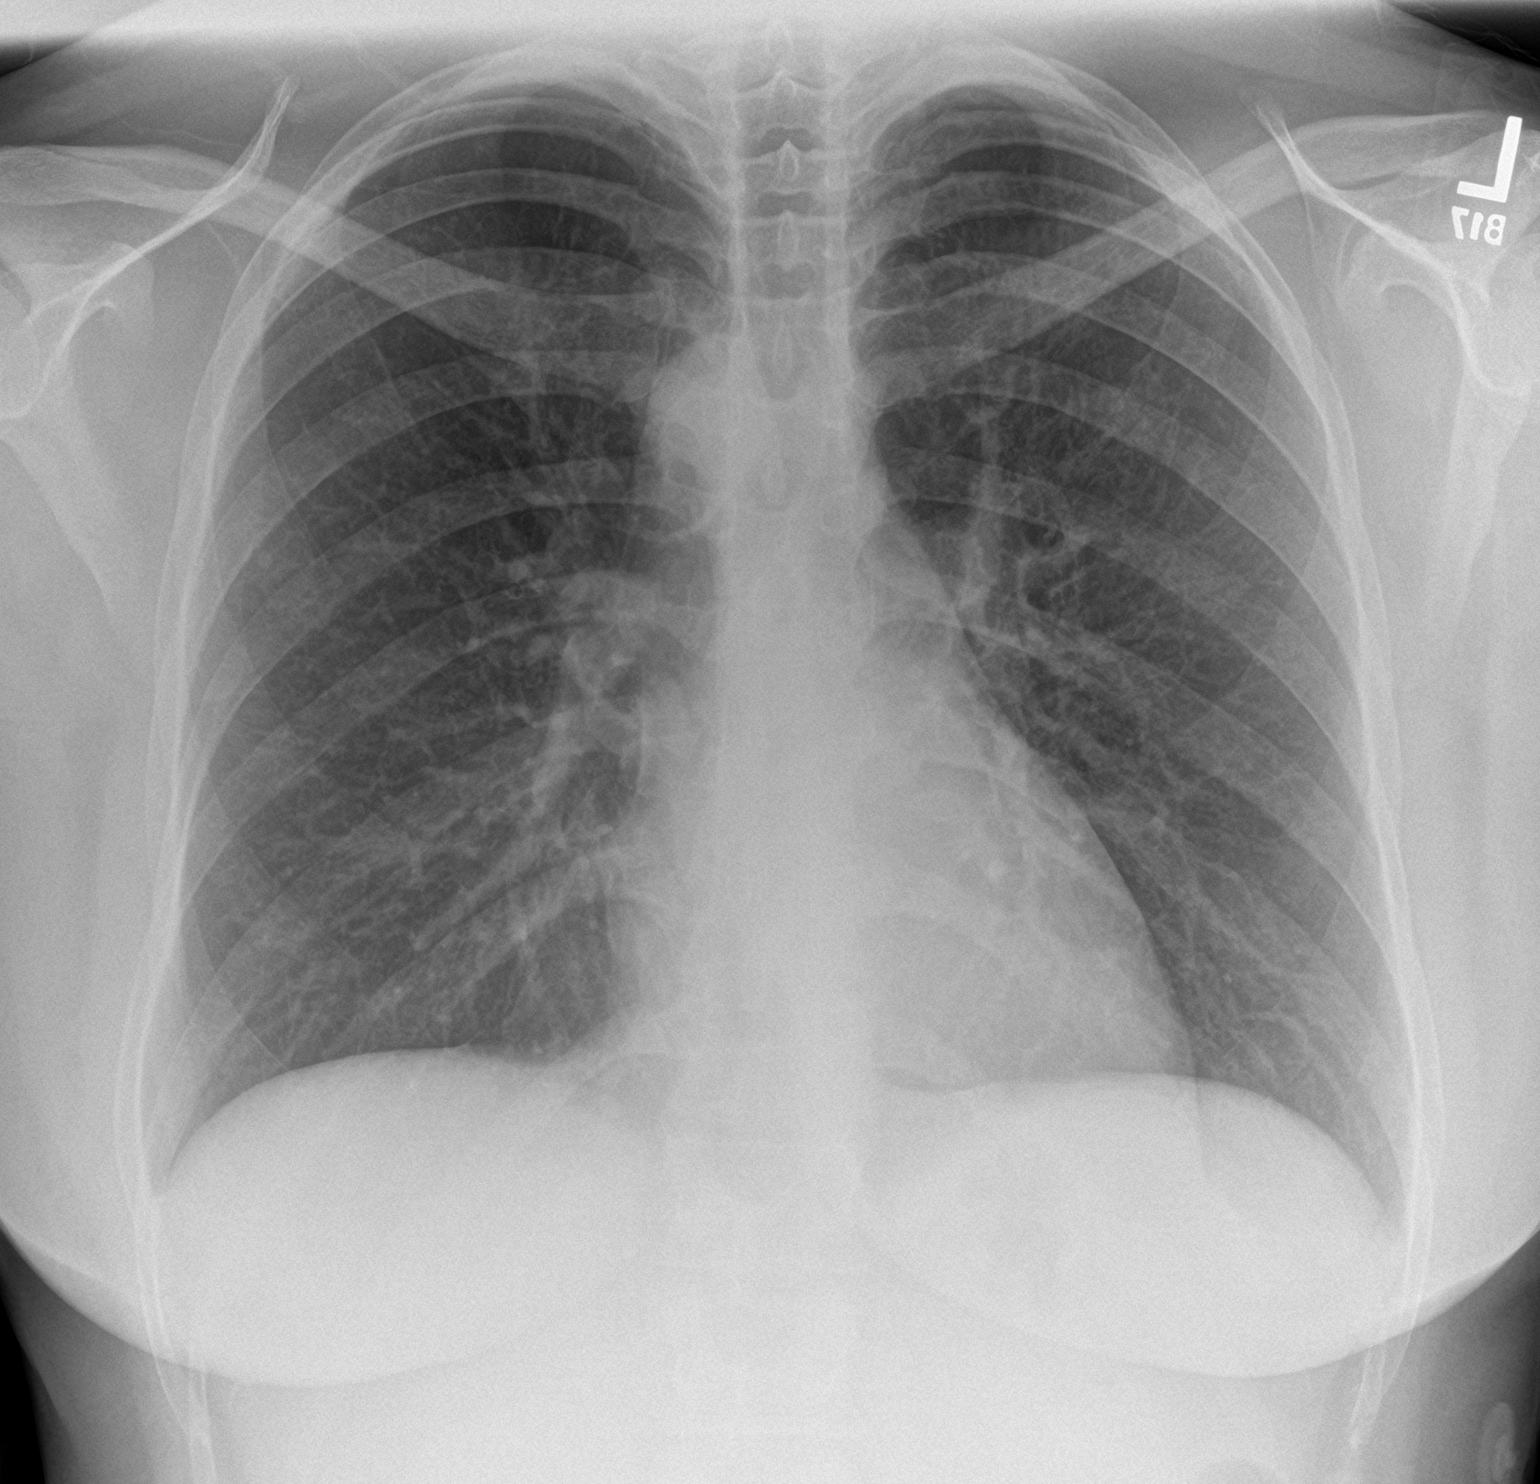

[chest lat]
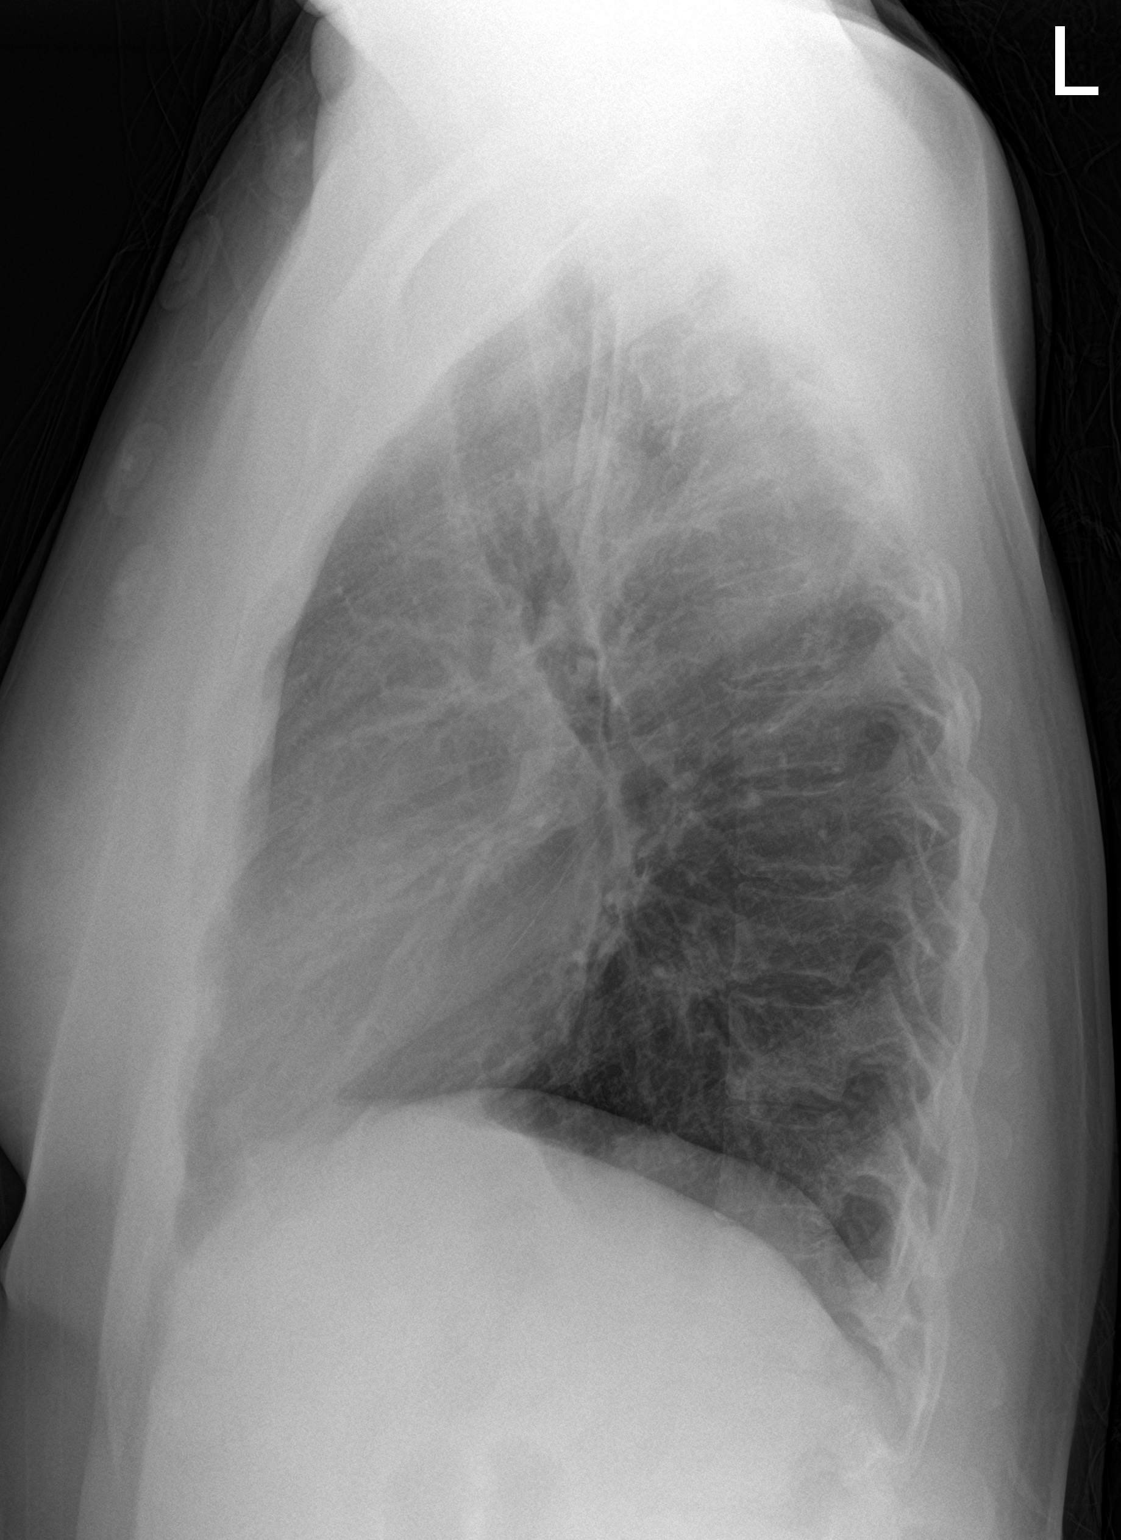

[2 of 2 positions shown; findings below may reference images not displayed]

FINDINGS: Normal heart size and pulmonary vascularity.

RIGHT side aortic arch.

Lungs clear.

No pulmonary infiltrate, pleural effusion, or pneumothorax.

Osseous structures unremarkable.
IMPRESSION: No acute abnormalities.

RIGHT side aortic arch.

## 2019-06-03 NOTE — Progress Notes (Signed)
PCP: System, Provider Not In  Subjective:   HPI: Patient is a 38 y.o. female here for right finger, left leg pain.  Patient reports she's had swelling and pain in her right index finger for months. Localizes this to the PIP joint as primary location at 8/10 level and sharp. Worse the past week and worse with flexion. Feels numbness locally. 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 She is right handed. No catching or locking of digit.  Also reports feeling of spasms in left leg and feels like it will give out. Feels this in her calf muscle where it gets stuck/stiff. Takes a rest for 10 minutes and resolves. She has a 3.75 pack year history of smoking.  HTN in problem list but never on meds and BP normal today. No prolonged immobility. No swelling, numbness.  Past Medical History:  Diagnosis Date  . Anxiety   . Back pain, chronic   . Depression    hx of   . Dysrhythmia 2013   hx of a-fib  . Ectopic pregnancy   . GERD (gastroesophageal reflux disease)   . Headache    occasional   . Hypertension    was told, but never on meds  . Infection    UTI  . Ovarian cyst   . Urinary tract infection     No current outpatient medications on file prior to visit.   No current facility-administered medications on file prior to visit.     Past Surgical History:  Procedure Laterality Date  . CLEFT PALATE REPAIR    . etopical surgery    . LAPAROSCOPY FOR ECTOPIC PREGNANCY  2011  . TYMPANOPLASTY Right 08/03/2015   Procedure: RIGHT MEDIAL TECHNIQUE TYMPANOPLASTY;  Surgeon: Flo ShanksKarol Wolicki, MD;  Location: [facility];  Service: ENT;  Laterality: Right;  . VENTRAL HERNIA REPAIR N/A 03/26/2015   Procedure: LAPAROSCOPIC REPAIR OF INCARCERATED SUPERUMBILICAL HERNIA X2 AND UMBILICAL HERNIA REPAIR WITH MESH;  Surgeon: Karie SodaSteven Gross, MD;  Location: [facility];  Service: General;  Laterality: N/A;    Allergies  Allergen Reactions  . Flexeril [Cyclobenzaprine] Hives     Social History   Socioeconomic History  . Marital status: Single    Spouse name: Not on file  . Number of children: Not on file  . Years of education: Not on file  . Highest education level: Not on file  Occupational History  . Not on file  Social Needs  . Financial resource strain: Not on file  . Food insecurity    Worry: Not on file    Inability: Not on file  . Transportation needs    Medical: Not on file    Non-medical: Not on file  Tobacco Use  . Smoking status: Former Smoker    Packs/day: 0.25    Years: 15.00    Pack years: 3.75    Types: Cigarettes  . Smokeless tobacco: Never Used  Substance and Sexual Activity  . Alcohol use: Yes    Alcohol/week: 1.0 standard drinks    Types: 1 Standard drinks or equivalent per week    Comment: social  . Drug use: No  . Sexual activity: Yes    Partners: Male    Birth control/protection: None  Lifestyle  . Physical activity    Days per week: Not on file    Minutes per session: Not on file  . Stress: Not on file  Relationships  . Social connections    Talks on phone: Not on  file    Gets together: Not on file    Attends religious service: Not on file    Active member of club or organization: Not on file    Attends meetings of clubs or organizations: Not on file    Relationship status: Not on file  . Intimate partner violence    Fear of current or ex partner: Not on file    Emotionally abused: Not on file    Physically abused: Not on file    Forced sexual activity: Not on file  Other Topics Concern  . Not on file  Social History Narrative  . Not on file    Family History  Problem Relation Age of Onset  . Hypertension Mother   . Hypertension Father   . Cancer Sister   . Heart disease Maternal Grandmother   . Cancer Maternal Grandfather   . Other Neg Hx   . Hearing loss Neg Hx     BP 120/88   Ht 5' (1.524 m)   Wt 136 lb (61.7 kg)   BMI 26.56 kg/m   Review of Systems: See HPI above.     Objective:   Physical Exam:  Gen: NAD, comfortable in exam room.  Right hand/wrist: Swelling mild around 2nd PIP joint.  No other swelling elsewhere digits or hand. FROM with 5/5 strength flexion and extension all digits including 2nd PIP, MCP, DIP. Tenderness to palpation circumferentially about 2nd PIP.  No other tenderness. NVI distally.  Left knee/lower leg: No gross deformity, ecchymoses, swelling. Minimal TTP medial gastroc. FROM with 5/5 strength flexion and extension knee, all motions ankle without pain. Negative ant/post drawers. Negative valgus/varus testing. Negative lachmans. Negative mcmurrays, apleys. NV intact distally with 2+ dp and pt pulses.  Right lower leg: No deformity. FROM with 5/5 strength. No tenderness to palpation. NVI distally.  MSK u/s: Mild arthropathy of 2nd PIP joint without effusion.  Right gastroc appears normal.  Popliteal vein easily compressible.    Assessment & Plan:  1. Right 2nd digit pain - consistent with arthritis and not trigger finger - no tenderness at A1 pulley and no catching/locking.  Discussed tylenol, aleve, topical medications.  Consider buddy taping, injection.  Heat or ice.  F/u in 5-6 weeks.  2. Left lower leg pain - 2/2 overuse calf strain/spasms.  Good pulses and cap refill, no skin discoloration or other findings that would suggest PAD with symptoms from claudication.  Compression, home exercises.  Heat or ice, tylenol and/or aleve.  F/u in 5-6 weeks.

## 2019-07-03 ENCOUNTER — Ambulatory Visit: Payer: BLUE CROSS/BLUE SHIELD | Admitting: Family Medicine

## 2019-07-08 ENCOUNTER — Other Ambulatory Visit: Payer: Self-pay

## 2019-07-08 ENCOUNTER — Ambulatory Visit (INDEPENDENT_AMBULATORY_CARE_PROVIDER_SITE_OTHER): Payer: BLUE CROSS/BLUE SHIELD | Admitting: Family Medicine

## 2019-07-08 ENCOUNTER — Encounter: Payer: Self-pay | Admitting: Family Medicine

## 2019-07-08 VITALS — BP 120/80 | Ht 60.0 in | Wt 136.0 lb

## 2019-07-08 DIAGNOSIS — M79644 Pain in right finger(s): Secondary | ICD-10-CM | POA: Diagnosis not present

## 2019-07-08 DIAGNOSIS — M79605 Pain in left leg: Secondary | ICD-10-CM | POA: Diagnosis not present

## 2019-07-08 MED ORDER — DICLOFENAC SODIUM 75 MG PO TBEC: 75.0000 mg | DELAYED_RELEASE_TABLET | Freq: Two times a day (BID) | ORAL | 1 refills | Status: DC

## 2019-07-08 MED FILL — DICLOFENAC SODIUM 75 MG TAB: 75 | 30 days supply | Qty: 60 | Fill #0

## 2019-07-08 NOTE — Patient Instructions (Addendum)
Try using a larger set of gloves at work and buddy taping underneath these. Your pain is due to arthritis. These are the different medications you can take for this: Tylenol 500mg  1-2 tabs three times a day for pain. Capsaicin, aspercreme, or biofreeze topically up to four times a day may also help with pain. Some supplements that may help for arthritis: Boswellia extract, curcumin, pycnogenol Diclofenac 75mg  twice a day with food for pain and inflammation - I'd recommend taking this before going to work for sure. Don't take aleve or ibuprofen while you're taking the diclofenac. Start physical therapy for your calf pain. Follow up with me in 5-6 weeks.

## 2019-07-08 NOTE — Progress Notes (Signed)
PCP: System, Provider Not In  Subjective:   HPI: Patient is a 38 y.o. female here for follow up for left calf pain and right 2nd-digit PIP joint pain.  Calf pain: She continues to have pain in her calf that is "crampy" and feels it is radiating into her lateral left thigh. 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 Pain is worse with plantar flexion, while dorsiflexion creates pain in the anterior aspect of her leg. The pain is better when she is able to rest or elevate her leg at the end of the day. She has been doing her daily assigned home exercises and wearing her calf compression sleeve. Cold compresses to the calf help alleviate the pain minimally. She has not tried warm compresses, tylenol or ibuprofen.  Right finger pain: patient continues to have right 2nd finger pain located medially to the PIP. Flexing the joint (while grabbing an object or forming a fist) makes the pain worse, there is no pain with extending the joint. She has been "buddy taping" the joint in the afternoons after work and before bed time because she has to wear gloves at work. She does not like to sleep with her fingers taped. Buddy taping the joint does relieve the pain. She has not tried tylenol or ibuprofen.  Past Medical History:  Diagnosis Date  . Anxiety   . Back pain, chronic   . Depression    hx of   . Dysrhythmia 2013   hx of a-fib  . Ectopic pregnancy   . GERD (gastroesophageal reflux disease)   . Headache    occasional   . Hypertension    was told, but never on meds  . Infection    UTI  . Ovarian cyst   . Urinary tract infection     No current outpatient medications on file prior to visit.   No current facility-administered medications on file prior to visit.     Past Surgical History:  Procedure Laterality Date  . CLEFT PALATE REPAIR    . etopical surgery    . LAPAROSCOPY FOR ECTOPIC PREGNANCY  2011  . TYMPANOPLASTY Right 08/03/2015   Procedure: RIGHT MEDIAL TECHNIQUE TYMPANOPLASTY;  Surgeon: Flo ShanksKarol Wolicki, MD;  Location: [facility];  Service: ENT;  Laterality: Right;  . VENTRAL HERNIA REPAIR N/A 03/26/2015   Procedure: LAPAROSCOPIC REPAIR OF INCARCERATED SUPERUMBILICAL HERNIA X2 AND UMBILICAL HERNIA REPAIR WITH MESH;  Surgeon: Karie SodaSteven Gross, MD;  Location: [facility];  Service: General;  Laterality: N/A;    Allergies  Allergen Reactions  . Flexeril [Cyclobenzaprine] Hives    Social History   Socioeconomic History  . Marital status: Single    Spouse name: Not on file  . Number of children: Not on file  . Years of education: Not on file  . Highest education level: Not on file  Occupational History  . Not on file  Social Needs  . Financial resource strain: Not on file  . Food insecurity    Worry: Not on file    Inability: Not on file  . Transportation needs    Medical: Not on file    Non-medical: Not on file  Tobacco Use  . Smoking status: Former Smoker    Packs/day: 0.25    Years: 15.00    Pack years: 3.75    Types: Cigarettes  . Smokeless tobacco: Never Used  Substance and Sexual Activity  . Alcohol use: Yes    Alcohol/week:  1.0 standard drinks    Types: 1 Standard drinks or equivalent per week    Comment: social  . Drug use: No  . Sexual activity: Yes    Partners: Male    Birth control/protection: None  Lifestyle  . Physical activity    Days per week: Not on file    Minutes per session: Not on file  . Stress: Not on file  Relationships  . Social Herbalist on phone: Not on file    Gets together: Not on file    Attends religious service: Not on file    Active member of club or organization: Not on file    Attends meetings of clubs or organizations: Not on file    Relationship status: Not on file  . Intimate partner violence    Fear of current or ex partner: Not on file    Emotionally abused: Not on file    Physically abused: Not on file    Forced sexual activity: Not  on file  Other Topics Concern  . Not on file  Social History Narrative  . Not on file    Family History  Problem Relation Age of Onset  . Hypertension Mother   . Hypertension Father   . Cancer Sister   . Heart disease Maternal Grandmother   . Cancer Maternal Grandfather   . Other Neg Hx   . Hearing loss Neg Hx     BP 120/80   Ht 5' (1.524 m)   Wt 136 lb (61.7 kg)   BMI 26.56 kg/m   Review of Systems: no fevers, chest pain, shortness of breath, nausea, vomiting, or abdominal pains. No falls reported. See HPI above.     Objective:  Physical Exam:  Gen: NAD, comfortable in exam room, pleasant patient Pulmo: normal work of breathing Extremities:  Right 2nd finger: minor swelling to PIP, tenderness elicited to medial aspect of joint with all ROM exercises, no erythema or ecchymoses, strength 5/5 in flexion at R PIP, strength 5/5 with left hand grip strength, no laxity appreciated to R 2nd PIP joint, adjacent joints (MCPs and DIPs) without deformity or pain, sensation intact to bilateral hands and all fingers.  Left Calf: no injury or deformity appreciated, tenderness appreciated to palpation of anterior tibialis and calf muscle, sensation intact, 5/5 strength in dorsi and plantar flexion to bilateral lower extremities, FROM ankle and knee.  calf pain elicited to plantar flexion on R   Neuro: no focal neural deficits    Assessment & Plan:  1. Left leg/calf pain:  2/2 calf strain/spasms.  Start physical therapy for this as home exercises are not helping enough.  Tylenol, compression sleeve, diclofenac twice a day.  2. Right 2nd-digit medial PIP Pain 2/2 Arthritis: She will try using a larger set of gloves at work and buddy taping underneath these.  Diclofenac, tylenol, topical medications reviewed.  F/u in 5-6 weeks.   Milus Banister, [place], PGY-2 07/08/2019 10:22 AM

## 2019-07-09 ENCOUNTER — Encounter: Payer: Self-pay | Admitting: Family Medicine

## 2019-07-26 ENCOUNTER — Ambulatory Visit: Payer: BLUE CROSS/BLUE SHIELD | Admitting: Physical Therapy

## 2019-08-19 ENCOUNTER — Ambulatory Visit: Payer: BLUE CROSS/BLUE SHIELD | Admitting: Family Medicine

## 2020-02-18 ENCOUNTER — Encounter: Payer: Self-pay | Admitting: Orthopaedic Surgery

## 2020-02-18 ENCOUNTER — Ambulatory Visit (INDEPENDENT_AMBULATORY_CARE_PROVIDER_SITE_OTHER): Payer: 59 | Admitting: Orthopaedic Surgery

## 2020-02-18 ENCOUNTER — Other Ambulatory Visit: Payer: Self-pay

## 2020-02-18 VITALS — Ht 60.0 in | Wt 138.0 lb

## 2020-02-18 DIAGNOSIS — M7711 Lateral epicondylitis, right elbow: Secondary | ICD-10-CM | POA: Diagnosis not present

## 2020-02-18 MED ORDER — BUPIVACAINE HCL 0.25 % IJ SOLN
0.3300 mL | INTRAMUSCULAR | Status: AC | PRN
  Administered 2020-02-18: .33 mL

## 2020-02-18 MED ORDER — METHYLPREDNISOLONE ACETATE 40 MG/ML IJ SUSP
40.0000 mg | INTRAMUSCULAR | Status: AC | PRN
  Administered 2020-02-18: 40 mg

## 2020-02-18 MED ORDER — LIDOCAINE HCL 1 % IJ SOLN
1.0000 mL | INTRAMUSCULAR | Status: AC | PRN
  Administered 2020-02-18: 1 mL

## 2020-02-18 NOTE — Progress Notes (Signed)
Office Visit Note   Patient: [name]           Date of Birth: [birth year]           MRN: [id number] Visit Date: 02/18/2020              Requested by: No referring provider defined for this encounter. PCP: System, Provider Not In   Assessment & Plan: Visit Diagnoses:  1. Lateral epicondylitis, right elbow     Plan: Impression is right elbow lateral epicondylitis.  We will inject this with cortisone today.  I will also provide the patient with extensor tendon stretches.  She will follow-up with [place] as needed.  Follow-Up Instructions: Return if symptoms worsen or fail to improve.   Orders:  Orders Placed This Encounter  Procedures  . Hand/UE Inj: R elbow   No orders of the defined types were placed in this encounter.     Procedures: Hand/UE Inj: R elbow for lateral epicondylitis on 02/18/2020 9:49 AM Indications: pain Details: 22 G needle Medications: 1 mL lidocaine 1 %; 0.33 mL bupivacaine 0.25 %; 40 mg methylPREDNISolone acetate 40 MG/ML      Clinical Data: No additional findings.   Subjective: Chief Complaint  Patient presents with  . Right Elbow - Pain    HPI patient is a pleasant 39 year old right-hand-dominant female who comes in today with right elbow pain.  This began several months ago and is progressively worsened.  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  The pain is primarily to the lateral elbow but has recently noticed pain to the medial side as well.  Pain is worse when she is holding her phone or when picking up anything heavy.  She has been taking tramadol and diclofenac without relief of symptoms.  She denies any numbness, tingling or burning.  She was seen by urgent care in [place] recently for this where she was given an tennis elbow strap and oral anti-inflammatories.  These have not improved her symptoms.  No previous cortisone  injection to the elbow.  She has not been doing any sort of stretches for the elbow either.  Review of Systems as detailed in HPI.  All others reviewed and are negative.   Objective: Vital Signs: Ht 5' (1.524 m)   Wt 138 lb (62.6 kg)   BMI 26.95 kg/m   Physical Exam well-developed well-nourished female no acute distress.  Alert and oriented x3.  Ortho Exam examination of her right elbow reveals full extension and flexion.  She has moderate tenderness to the lateral epicondyle.  Mild tenderness to the medial epicondyle.  No tenderness to the radial tunnel.  She has no pain with supination, pronation, wrist flexion or extension.  No pain with resisted extension of the long finger.  She does have significant pain with shaking her hand.  She is neurovascularly intact distally.  Specialty Comments:  No specialty comments available.  Imaging: No new imaging   PMFS History: Patient Active Problem List   Diagnosis Date Noted  . Closed fracture of right ankle 04/13/2018  . Ileus ([place]) 03/28/2015  . Incarcerated ventral hernia s/p lap repair w mesh 03/25/2014 03/26/2015  . Umbilical hernia s/p lap repair w mesh 03/25/2014 03/26/2015  . Bipolar disorder, unspecified ([place]) 09/01/2014  . Back muscle spasm 09/01/2014  . Smoking 09/01/2014  . Elevated BP 09/01/2014  . Atrial fibrillation,  new 01/13/2013  . Nausea and vomiting in adult 01/13/2013  . Hypertension 01/13/2013   Past Medical History:  Diagnosis Date  . Anxiety   . Back pain, chronic   . Depression    hx of   . Dysrhythmia 2013   hx of a-fib  . Ectopic pregnancy   . GERD (gastroesophageal reflux disease)   . Headache    occasional   . Hypertension    was told, but never on meds  . Infection    UTI  . Ovarian cyst   . Urinary tract infection     Family History  Problem Relation Age of Onset  . Hypertension Mother   . Hypertension Father   . Cancer Sister   . Heart disease Maternal Grandmother   . Cancer Maternal  Grandfather   . Other Neg Hx   . Hearing loss Neg Hx     Past Surgical History:  Procedure Laterality Date  . CLEFT PALATE REPAIR    . etopical surgery    . LAPAROSCOPY FOR ECTOPIC PREGNANCY  2011  . TYMPANOPLASTY Right 08/03/2015   Procedure: RIGHT MEDIAL TECHNIQUE TYMPANOPLASTY;  Surgeon: Flo Shanks, MD;  Location: [facility];  Service: ENT;  Laterality: Right;  . VENTRAL HERNIA REPAIR N/A 03/26/2015   Procedure: LAPAROSCOPIC REPAIR OF INCARCERATED SUPERUMBILICAL HERNIA X2 AND UMBILICAL HERNIA REPAIR WITH MESH;  Surgeon: Karie Soda, MD;  Location: [facility];  Service: General;  Laterality: N/A;   Social History   Occupational History  . Not on file  Tobacco Use  . Smoking status: Former Smoker    Packs/day: 0.25    Years: 15.00    Pack years: 3.75    Types: Cigarettes  . Smokeless tobacco: Never Used  Substance and Sexual Activity  . Alcohol use: Yes    Alcohol/week: 1.0 standard drinks    Types: 1 Standard drinks or equivalent per week    Comment: social  . Drug use: No  . Sexual activity: Yes    Partners: Male    Birth control/protection: None

## 2020-04-21 ENCOUNTER — Telehealth: Payer: Self-pay | Admitting: Orthopaedic Surgery

## 2020-04-21 NOTE — Telephone Encounter (Signed)
Called patient got recording call can not be completed at this time. Please try call again later. Per mychart message      Right elbow pain

## 2020-04-24 ENCOUNTER — Encounter (HOSPITAL_COMMUNITY): Payer: Self-pay

## 2020-04-24 ENCOUNTER — Other Ambulatory Visit: Payer: Self-pay

## 2020-04-24 ENCOUNTER — Ambulatory Visit (HOSPITAL_COMMUNITY)
Admission: EM | Admit: 2020-04-24 | Discharge: 2020-04-24 | Disposition: A | Discharge status: Home / Self Care | Payer: 59 | Attending: Physician Assistant | Admitting: Physician Assistant

## 2020-04-24 DIAGNOSIS — M7711 Lateral epicondylitis, right elbow: Secondary | ICD-10-CM | POA: Diagnosis not present

## 2020-04-24 MED ORDER — IBUPROFEN 800 MG PO TABS: 800.0000 mg | ORAL_TABLET | Freq: Three times a day (TID) | ORAL | 0 refills | Status: DC

## 2020-04-24 MED ORDER — DICLOFENAC SODIUM 1 % EX GEL: 4.0000 g | Freq: Four times a day (QID) | CUTANEOUS | 0 refills | Status: DC

## 2020-04-24 NOTE — Discharge Instructions (Signed)
Take the ibuprofen every 8 hours for the next 2 days, then use voltaren topically 4 times a day.  Follow up with the ortho office to see if they take your insurance, if not, schedule with the Sports med group

## 2020-04-24 NOTE — ED Provider Notes (Signed)
[facility]    CSN: [id number] Arrival date & time: 04/24/20  1748      History   Chief Complaint Chief Complaint  Patient presents with  . Elbow Pain    HPI [name] is a 39 y.o. female.   Patient with history of lateral epicondylitis presents for evaluation of pain in the right elbow.  [facility] for same.  Patient reports she has been followed by orthopedics and has received injections in the elbow previously however she had a change in her insurance and they told her that they could no longer see her due to her insurance.  She had injection in March.  She since then she has had return of pain.  She reports the pain is getting to the point where it is keeping her up at times.  She reports the pain is consistent with the weights been before.  She has not tried anything else since that time.  Denies significant changes from previous work-ups.     Past Medical History:  Diagnosis Date  . Anxiety   . Back pain, chronic   . Depression    hx of   . Dysrhythmia 2013   hx of a-fib  . Ectopic pregnancy   . GERD (gastroesophageal reflux disease)   . Headache    occasional   . Hypertension    was told, but never on meds  . Infection    UTI  . Ovarian cyst   . Urinary tract infection     Patient Active Problem List   Diagnosis Date Noted  . Closed fracture of right ankle 04/13/2018  . Ileus ([facility]) 03/28/2015  . Incarcerated ventral hernia s/p lap repair w mesh 03/25/2014 03/26/2015  . Umbilical hernia s/p lap repair w mesh 03/25/2014 03/26/2015  . Bipolar disorder, unspecified ([facility]) 09/01/2014  . Back muscle spasm 09/01/2014  . Smoking 09/01/2014  . Elevated BP 09/01/2014  . Atrial fibrillation, new 01/13/2013  . Nausea and vomiting in adult 01/13/2013  . Hypertension 01/13/2013    Past Surgical History:  Procedure Laterality Date  . CLEFT PALATE REPAIR    . etopical surgery    . LAPAROSCOPY FOR ECTOPIC PREGNANCY  2011  .  TYMPANOPLASTY Right 08/03/2015   Procedure: RIGHT MEDIAL TECHNIQUE TYMPANOPLASTY;  Surgeon: Flo Shanks, MD;  Location: [facility];  Service: ENT;  Laterality: Right;  . VENTRAL HERNIA REPAIR N/A 03/26/2015   Procedure: LAPAROSCOPIC REPAIR OF INCARCERATED SUPERUMBILICAL HERNIA X2 AND UMBILICAL HERNIA REPAIR WITH MESH;  Surgeon: Karie Soda, MD;  Location: [facility];  Service: General;  Laterality: N/A;    OB History    Gravida  2   Para      Term      Preterm      AB  2   Living  0     SAB  1   TAB  0   Ectopic  1   Multiple  0   Live Births               Home Medications    Prior to Admission medications   Medication Sig Start Date End Date Taking? Authorizing Provider  diclofenac (VOLTAREN) 75 MG EC tablet Take 1 tablet (75 mg total) by mouth 2 (two) times daily. 07/08/19   Hudnall, Azucena Fallen, MD  diclofenac Sodium (VOLTAREN) 1 % GEL Apply 4 g topically 4 (four) times daily. 04/24/20   Nare Gaspari, Veryl Speak, PA-C  ibuprofen (ADVIL)  800 MG tablet Take 1 tablet (800 mg total) by mouth 3 (three) times daily. 04/24/20   Surya Schroeter, Veryl Speak, PA-C    Family History Family History  Problem Relation Age of Onset  . Hypertension Mother   . Hypertension Father   . Cancer Sister   . Heart disease Maternal Grandmother   . Cancer Maternal Grandfather   . Other Neg Hx   . Hearing loss Neg Hx     Social History Social History   Tobacco Use  . Smoking status: Former Smoker    Packs/day: 0.25    Years: 15.00    Pack years: 3.75    Types: Cigarettes  . Smokeless tobacco: Never Used  Substance Use Topics  . Alcohol use: Yes    Alcohol/week: 1.0 standard drinks    Types: 1 Standard drinks or equivalent per week    Comment: social  . Drug use: No     Allergies   Flexeril [cyclobenzaprine]   Review of Systems Review of Systems   Physical Exam Triage Vital Signs ED Triage Vitals  Enc Vitals Group     BP 04/24/20 1844 139/87     Pulse Rate 04/24/20 1844 78       Resp 04/24/20 1844 14     Temp 04/24/20 1844 98.1 F (36.7 C)     Temp Source 04/24/20 1844 Oral     SpO2 04/24/20 1844 100 %     Weight --      Height --      Head Circumference --      Peak Flow --      Pain Score 04/24/20 1842 9     Pain Loc --      Pain Edu? --      Excl. in GC? --    No data found.  Updated Vital Signs BP 139/87 (BP Location: Right Arm)   Pulse 78   Temp 98.1 F (36.7 C) (Oral)   Resp 14   SpO2 100%   Visual Acuity Right Eye Distance:   Left Eye Distance:   Bilateral Distance:    Right Eye Near:   Left Eye Near:    Bilateral Near:     Physical Exam Vitals and nursing note reviewed.  Constitutional:      Appearance: Normal appearance.  Musculoskeletal:     Comments: Right elbow with tenderness palpation over the lateral epicondyle.  There is tenderness through the extensor musculature on the right forearm.  Pain elicited with resisted wrist extension.  There is no obvious swelling or deformity.  Good cap refill and good sensation.  Neurological:     Mental Status: She is alert.      UC Treatments / Results  Labs (all labs ordered are listed, but only abnormal results are displayed) Labs Reviewed - No data to display  EKG   Radiology No results found.  Procedures Procedures (including critical care time)  Medications Ordered in UC Medications - No data to display  Initial Impression / Assessment and Plan / UC Course  I have reviewed the triage vital signs and the nursing notes.  Pertinent labs & imaging results that were available during my care of the patient were reviewed by me and considered in my medical decision making (see chart for details).     #Lateral epicondylitis Patient is 39 year old with known lateral epicondylitis of right elbow.  Symptomatic today.  Will place on ibuprofen for few days and then recommend utilizing diclofenac gel.  Sports medicine  group information given for follow-up.  Final Clinical  Impressions(s) / UC Diagnoses   Final diagnoses:  Lateral epicondylitis of right elbow     Discharge Instructions     Take the ibuprofen every 8 hours for the next 2 days, then use voltaren topically 4 times a day.  Follow up with the ortho office to see if they take your insurance, if not, schedule with the Sports med group    ED Prescriptions    Medication Sig Dispense Auth. Provider   ibuprofen (ADVIL) 800 MG tablet Take 1 tablet (800 mg total) by mouth 3 (three) times daily. 21 tablet Gorman Safi, Marguerita Beards, PA-C   diclofenac Sodium (VOLTAREN) 1 % GEL Apply 4 g topically 4 (four) times daily. 100 g Ravina Milner, Marguerita Beards, PA-C     PDMP not reviewed this encounter.   Purnell Shoemaker, PA-C 04/25/20 [phone]

## 2020-04-24 NOTE — ED Triage Notes (Signed)
Patient reports exacerbation of chronic elbow pain. Reports that she has been seeing an orthopedist, but they no longer take her insurance.

## 2020-09-15 ENCOUNTER — Other Ambulatory Visit: Payer: Self-pay

## 2020-09-15 ENCOUNTER — Ambulatory Visit (HOSPITAL_COMMUNITY)
Admission: EM | Admit: 2020-09-15 | Discharge: 2020-09-15 | Disposition: A | Discharge status: Home / Self Care | Payer: Self-pay

## 2020-09-15 NOTE — Care Management (Signed)
Patient wants to receive services with a psychiatrist and a therapist on an outpatient basis.  Patient was linked to [facility] outpatient walk in clinic.    Patient denies SI/HI/Psychosis/Substance Abuse.  Patient denies MSE Exam.   

## 2020-09-22 ENCOUNTER — Ambulatory Visit (HOSPITAL_COMMUNITY): Payer: Self-pay | Admitting: Physician Assistant

## 2020-12-22 ENCOUNTER — Ambulatory Visit (HOSPITAL_COMMUNITY)
Admission: EM | Admit: 2020-12-22 | Discharge: 2020-12-22 | Disposition: A | Discharge status: Home / Self Care | Payer: No Typology Code available for payment source

## 2021-04-21 ENCOUNTER — Telehealth (HOSPITAL_COMMUNITY): Payer: Self-pay | Admitting: Family Medicine

## 2021-04-21 ENCOUNTER — Ambulatory Visit (INDEPENDENT_AMBULATORY_CARE_PROVIDER_SITE_OTHER): Payer: No Typology Code available for payment source

## 2021-04-21 ENCOUNTER — Other Ambulatory Visit (HOSPITAL_COMMUNITY): Payer: Self-pay

## 2021-04-21 ENCOUNTER — Other Ambulatory Visit: Payer: Self-pay

## 2021-04-21 ENCOUNTER — Encounter (HOSPITAL_COMMUNITY): Payer: Self-pay

## 2021-04-21 ENCOUNTER — Ambulatory Visit (HOSPITAL_COMMUNITY)
Admission: EM | Admit: 2021-04-21 | Discharge: 2021-04-21 | Disposition: A | Discharge status: Home / Self Care | Payer: No Typology Code available for payment source | Attending: Family Medicine | Admitting: Family Medicine

## 2021-04-21 DIAGNOSIS — R059 Cough, unspecified: Secondary | ICD-10-CM

## 2021-04-21 DIAGNOSIS — R062 Wheezing: Secondary | ICD-10-CM | POA: Diagnosis not present

## 2021-04-21 MED ORDER — HYDROCOD POLST-CPM POLST ER 10-8 MG/5ML PO SUER: 5.0000 mL | Freq: Every evening | ORAL | 0 refills | Status: DC | PRN

## 2021-04-21 MED ORDER — HYDROCOD POLST-CPM POLST ER 10-8 MG/5ML PO SUER
5.0000 mL | Freq: Every evening | ORAL | 0 refills | Status: DC | PRN
  Filled 2021-04-21: qty 60, 12d supply, fill #0

## 2021-04-21 MED ORDER — PREDNISONE 10 MG PO TABS
ORAL_TABLET | ORAL | 0 refills | Status: DC
  Filled 2021-04-21: qty 48, 12d supply, fill #0

## 2021-04-21 MED ORDER — PREDNISONE 10 MG PO TABS: ORAL_TABLET | ORAL | 0 refills | Status: DC

## 2021-04-21 NOTE — ED Triage Notes (Signed)
Pt c/o cough x 1 week. Pt states she went to her PCP and states she was prescribed Prednisone 20 mg and use the inhaler. She states the cough is worse at night.

## 2021-04-21 NOTE — ED Provider Notes (Signed)
[facility]   [id number] 04/21/21 Arrival Time: 0911  ASSESSMENT & PLAN:  1. Cough   2. Wheezing    I have personally viewed the imaging studies ordered this visit. No acute changes or signs of infection.  Begin: Meds ordered this encounter  Medications  . predniSONE (STERAPRED UNI-PAK 48 TAB) 10 MG (48) TBPK tablet    Sig: Take as directed.    Dispense:  48 tablet    Refill:  0  . chlorpheniramine-HYDROcodone (TUSSIONEX PENNKINETIC ER) 10-8 MG/5ML SUER    Sig: Take 5 mLs by mouth at bedtime as needed for cough.    Dispense:  60 mL    Refill:  0   Work note provided.   Discharge Instructions     Be aware, you have been prescribed pain medications that may cause drowsiness. While taking this medication, do not take any other medications containing acetaminophen (Tylenol). Do not combine with alcohol or other illicit drugs. Please do not drive, operate heavy machinery, or take part in activities that require making important decisions while on this medication as your judgement may be clouded.        Follow-up Information    [facility].   Specialty: Urgent Care Why: If worsening or failing to improve as anticipated. Contact information: [address] [place] [address] [phone]               Reviewed expectations re: course of current medical issues. Questions answered. Outlined signs and symptoms indicating need for more acute intervention. Understanding verbalized. After Visit Summary given.   SUBJECTIVE: History from: patient. [name] is a 40 y.o. female who reports cough x 1 week. States she went to her PCP and was prescribed Prednisone 20 mg and use the inhaler. Not much relief. She states the cough is worse at night.  Afebrile. No specific SOB reported.  Social History   Tobacco Use  Smoking Status Former Smoker  . Packs/day: 0.25  . Years: 15.00  . Pack years: 3.75  .  Types: Cigarettes  Smokeless Tobacco Never Used   OBJECTIVE:  Vitals:   04/21/21 0949  BP: (!) 140/92  Pulse: 90  Resp: 18  Temp: 98 F (36.7 C)  TempSrc: Oral  SpO2: 98%    General appearance: alert; no distress Eyes: PERRLA; EOMI; conjunctiva normal HENT: [place]; AT; with nasal congestion Neck: supple  Lungs: speaks full sentences without difficulty; unlabored; active coughing with bilateral exp wheezing Extremities: no edema Skin: warm and dry Neurologic: normal gait Psychological: alert and cooperative; normal mood and affect   Allergies  Allergen Reactions  . Flexeril [Cyclobenzaprine] Hives    Past Medical History:  Diagnosis Date  . Anxiety   . Back pain, chronic   . Depression    hx of   . Dysrhythmia 2013   hx of a-fib  . Ectopic pregnancy   . GERD (gastroesophageal reflux disease)   . Headache    occasional   . Hypertension    was told, but never on meds  . Infection    UTI  . Ovarian cyst   . Urinary tract infection    Social History   Socioeconomic History  . Marital status: Single    Spouse name: Not on file  . Number of children: Not on file  . Years of education: Not on file  . Highest education level: Not on file  Occupational History  . Not on file  Tobacco  Use  . Smoking status: Former Smoker    Packs/day: 0.25    Years: 15.00    Pack years: 3.75    Types: Cigarettes  . Smokeless tobacco: Never Used  Vaping Use  . Vaping Use: Never used  Substance and Sexual Activity  . Alcohol use: Yes    Alcohol/week: 1.0 standard drink    Types: 1 Standard drinks or equivalent per week    Comment: social  . Drug use: No  . Sexual activity: Yes    Partners: Male    Birth control/protection: None  Other Topics Concern  . Not on file  Social History Narrative  . Not on file   Social Determinants of Health   Financial Resource Strain: Not on file  Food Insecurity: Not on file  Transportation Needs: Not on file  Physical Activity: Not  on file  Stress: Not on file  Social Connections: Not on file  Intimate Partner Violence: Not on file   Family History  Problem Relation Age of Onset  . Hypertension Mother   . Hypertension Father   . Cancer Sister   . Heart disease Maternal Grandmother   . Cancer Maternal Grandfather   . Other Neg Hx   . Hearing loss Neg Hx    Past Surgical History:  Procedure Laterality Date  . CLEFT PALATE REPAIR    . etopical surgery    . LAPAROSCOPY FOR ECTOPIC PREGNANCY  2011  . TYMPANOPLASTY Right 08/03/2015   Procedure: RIGHT MEDIAL TECHNIQUE TYMPANOPLASTY;  Surgeon: Flo Shanks, MD;  Location: [facility];  Service: ENT;  Laterality: Right;  . VENTRAL HERNIA REPAIR N/A 03/26/2015   Procedure: LAPAROSCOPIC REPAIR OF INCARCERATED SUPERUMBILICAL HERNIA X2 AND UMBILICAL HERNIA REPAIR WITH MESH;  Surgeon: Karie Soda, MD;  Location: [facility];  Service: General;  Laterality: Vertis Kelch, MD 04/21/21 1125

## 2021-04-21 NOTE — Discharge Instructions (Addendum)

## 2021-04-21 NOTE — Telephone Encounter (Signed)
Needs Rx sent to different pharmacy.  Sent to [facility].  Meds ordered this encounter  Medications  . chlorpheniramine-HYDROcodone (TUSSIONEX PENNKINETIC ER) 10-8 MG/5ML SUER    Sig: Take 5 mLs by mouth at bedtime as needed for cough.    Dispense:  60 mL    Refill:  0  . predniSONE (DELTASONE) 10 MG tablet    Sig: Take as directed.    Dispense:  48 tablet    Refill:  0

## 2021-07-10 ENCOUNTER — Other Ambulatory Visit: Payer: Self-pay

## 2021-07-10 ENCOUNTER — Emergency Department (HOSPITAL_BASED_OUTPATIENT_CLINIC_OR_DEPARTMENT_OTHER)
Admission: EM | Admit: 2021-07-10 | Discharge: 2021-07-11 | Disposition: A | Discharge status: Home / Self Care | Payer: No Typology Code available for payment source | Attending: Emergency Medicine | Admitting: Emergency Medicine

## 2021-07-10 DIAGNOSIS — K2900 Acute gastritis without bleeding: Secondary | ICD-10-CM | POA: Diagnosis not present

## 2021-07-10 DIAGNOSIS — Z79899 Other long term (current) drug therapy: Secondary | ICD-10-CM | POA: Diagnosis not present

## 2021-07-10 DIAGNOSIS — R112 Nausea with vomiting, unspecified: Secondary | ICD-10-CM | POA: Diagnosis present

## 2021-07-10 DIAGNOSIS — Z87891 Personal history of nicotine dependence: Secondary | ICD-10-CM | POA: Diagnosis not present

## 2021-07-10 DIAGNOSIS — K219 Gastro-esophageal reflux disease without esophagitis: Secondary | ICD-10-CM | POA: Insufficient documentation

## 2021-07-10 DIAGNOSIS — F10220 Alcohol dependence with intoxication, uncomplicated: Secondary | ICD-10-CM | POA: Diagnosis not present

## 2021-07-10 DIAGNOSIS — I10 Essential (primary) hypertension: Secondary | ICD-10-CM | POA: Insufficient documentation

## 2021-07-10 LAB — URINALYSIS, ROUTINE W REFLEX MICROSCOPIC
Bilirubin Urine: NEGATIVE
Glucose, UA: NEGATIVE mg/dL
Hgb urine dipstick: NEGATIVE
Ketones, ur: NEGATIVE mg/dL
Leukocytes,Ua: NEGATIVE
Nitrite: NEGATIVE
Protein, ur: NEGATIVE mg/dL
Specific Gravity, Urine: 1.019 (ref 1.005–1.030)
pH: 7 (ref 5.0–8.0)

## 2021-07-10 LAB — COMPREHENSIVE METABOLIC PANEL
ALT: 50 U/L — ABNORMAL HIGH (ref 0–44)
AST: 31 U/L (ref 15–41)
Albumin: 4.8 g/dL (ref 3.5–5.0)
Alkaline Phosphatase: 81 U/L (ref 38–126)
Anion gap: 14 (ref 5–15)
BUN: 10 mg/dL (ref 6–20)
CO2: 25 mmol/L (ref 22–32)
Calcium: 9.9 mg/dL (ref 8.9–10.3)
Chloride: 102 mmol/L (ref 98–111)
Creatinine, Ser: 0.52 mg/dL (ref 0.44–1.00)
GFR, Estimated: >60 mL/min (ref >60)
Glucose, Bld: 144 mg/dL — ABNORMAL HIGH (ref 70–99)
Potassium: 3.8 mmol/L (ref 3.5–5.1)
Sodium: 141 mmol/L (ref 135–145)
Total Bilirubin: 0.3 mg/dL (ref 0.3–1.2)
Total Protein: 8.1 g/dL (ref 6.5–8.1)

## 2021-07-10 LAB — CBC WITH DIFFERENTIAL/PLATELET
Abs Immature Granulocytes: 0.1 10*3/uL — ABNORMAL HIGH (ref 0.00–0.07)
Basophils Absolute: 0.1 10*3/uL (ref 0.0–0.1)
Basophils Relative: 1 %
Eosinophils Absolute: 0.1 10*3/uL (ref 0.0–0.5)
Eosinophils Relative: 1 %
HCT: 47.7 % — ABNORMAL HIGH (ref 36.0–46.0)
Hemoglobin: 16 g/dL — ABNORMAL HIGH (ref 12.0–15.0)
Immature Granulocytes: 1 %
Lymphocytes Relative: 21 %
Lymphs Abs: 3.3 10*3/uL (ref 0.7–4.0)
MCH: 29.9 pg (ref 26.0–34.0)
MCHC: 33.5 g/dL (ref 30.0–36.0)
MCV: 89.2 fL (ref 80.0–100.0)
Monocytes Absolute: 1.3 10*3/uL — ABNORMAL HIGH (ref 0.1–1.0)
Monocytes Relative: 8 %
Neutro Abs: 11.3 10*3/uL — ABNORMAL HIGH (ref 1.7–7.7)
Neutrophils Relative %: 68 %
Platelets: 153 10*3/uL (ref 150–400)
RBC: 5.35 MIL/uL — ABNORMAL HIGH (ref 3.87–5.11)
RDW: 14.6 % (ref 11.5–15.5)
WBC: 16.2 10*3/uL — ABNORMAL HIGH (ref 4.0–10.5)
nRBC: 0 % (ref 0.0–0.2)

## 2021-07-10 LAB — PREGNANCY, URINE: Preg Test, Ur: NEGATIVE

## 2021-07-10 LAB — LIPASE, BLOOD: Lipase: 13 U/L (ref 11–51)

## 2021-07-10 MED ORDER — SODIUM CHLORIDE 0.9 % IV SOLN
12.5000 mg | Freq: Four times a day (QID) | INTRAVENOUS | Status: DC | PRN
  Administered 2021-07-10: 12.5 mg via INTRAVENOUS
  Filled 2021-07-10: qty 0.5

## 2021-07-10 MED ORDER — FAMOTIDINE 20 MG PO TABS: 20.0000 mg | ORAL_TABLET | Freq: Two times a day (BID) | ORAL | 0 refills | Status: DC

## 2021-07-10 MED ORDER — LORAZEPAM 2 MG/ML IJ SOLN
1.0000 mg | Freq: Once | INTRAMUSCULAR | Status: AC
  Administered 2021-07-10: 1 mg via INTRAVENOUS
  Filled 2021-07-10: qty 1

## 2021-07-10 MED ORDER — PANTOPRAZOLE SODIUM 40 MG IV SOLR
40.0000 mg | Freq: Once | INTRAVENOUS | Status: AC
  Administered 2021-07-10: 40 mg via INTRAVENOUS
  Filled 2021-07-10: qty 40

## 2021-07-10 MED ORDER — SODIUM CHLORIDE 0.9 % IV BOLUS
1000.0000 mL | Freq: Once | INTRAVENOUS | Status: AC
  Administered 2021-07-10: 1000 mL via INTRAVENOUS

## 2021-07-10 MED ORDER — SODIUM CHLORIDE 0.9 % IV SOLN
12.5000 mg | Freq: Four times a day (QID) | INTRAVENOUS | Status: DC | PRN
Start: 2021-07-10 — End: 2021-07-10
  Filled 2021-07-10: qty 0.5

## 2021-07-10 MED ORDER — ONDANSETRON HCL 4 MG/2ML IJ SOLN
4.0000 mg | Freq: Once | INTRAMUSCULAR | Status: AC
  Administered 2021-07-10: 4 mg via INTRAVENOUS
  Filled 2021-07-10: qty 2

## 2021-07-10 MED ORDER — ONDANSETRON HCL 4 MG PO TABS: 4.0000 mg | ORAL_TABLET | Freq: Four times a day (QID) | ORAL | 0 refills | Status: DC

## 2021-07-10 MED ORDER — HALOPERIDOL LACTATE 5 MG/ML IJ SOLN
2.0000 mg | Freq: Once | INTRAMUSCULAR | Status: AC
  Administered 2021-07-10: 2 mg via INTRAVENOUS
  Filled 2021-07-10 (×2): qty 1

## 2021-07-10 MED ORDER — PROMETHAZINE HCL 25 MG PO TABS: 25.0000 mg | ORAL_TABLET | Freq: Four times a day (QID) | ORAL | 0 refills | Status: DC | PRN

## 2021-07-10 MED ORDER — SODIUM CHLORIDE 0.9 % IV SOLN: INTRAVENOUS | Status: DC

## 2021-07-10 MED ORDER — PROMETHAZINE HCL 25 MG/ML IJ SOLN
INTRAMUSCULAR | Status: AC
  Administered 2021-07-10: 12.5 mg via INTRAVENOUS
  Filled 2021-07-10: qty 1

## 2021-07-10 NOTE — ED Notes (Signed)
Pt placed on bedpan, unable to void 

## 2021-07-10 NOTE — ED Notes (Signed)
Pt given water, ginger ale, and crackers.

## 2021-07-10 NOTE — Discharge Instructions (Addendum)
Avoid any alcohol.  Take the Pepcid the Zofran and the Phenergan to help with the nausea vomiting.  If it is not able to control it return.  Heart with clear liquid diet then bland diet.

## 2021-07-10 NOTE — ED Notes (Signed)
She has vomited x 2 since receiving Zofran. Dr. Fredderick Phenix is made aware of this.

## 2021-07-10 NOTE — ED Notes (Signed)
She states she feels "a little better". She is snacking as I write this.

## 2021-07-10 NOTE — ED Notes (Signed)
She has just vomited again. Orders rec'd. From Dr. Fredderick Phenix.

## 2021-07-10 NOTE — ED Triage Notes (Signed)
She reports drinking "A lot of Christiane Ha and lots of other stuff last night" [sic]. She c/o n/v this morning. She is in no distress. She vomits shortly after arrival.

## 2021-07-10 NOTE — ED Notes (Signed)
She has just voided again and continues to feel better.

## 2021-07-10 NOTE — ED Notes (Signed)
She continues to feel "better". No emesis for quite some time now.

## 2021-07-10 NOTE — ED Provider Notes (Signed)
[facility] EMERGENCY DEPT Provider Note   CSN: [id number] Arrival date & time: 07/10/21  0915     History Chief Complaint  Patient presents with   Vomiting   Alcohol Intoxication    [name] is a 40 y.o. female.  Patient is a 40 year old female who presents with nausea and vomiting.  She states that she drank a lot of Christiane Ha last night and through the morning hours.  She says that she feels like she is vomiting because she drank too much.  She has had nonbilious, nonbloody emesis.  She also had some loose stools.  No known fevers.  She has some diffuse crampy abdominal pain.  She has not take anything prior to arrival.      Past Medical History:  Diagnosis Date   Anxiety    Back pain, chronic    Depression    hx of    Dysrhythmia 2013   hx of a-fib   Ectopic pregnancy    GERD (gastroesophageal reflux disease)    Headache    occasional    Hypertension    was told, but never on meds   Infection    UTI   Ovarian cyst    Urinary tract infection     Patient Active Problem List   Diagnosis Date Noted   Closed fracture of right ankle 04/13/2018   Ileus (HCC) 03/28/2015   Incarcerated ventral hernia s/p lap repair w mesh 03/25/2014 03/26/2015   Umbilical hernia s/p lap repair w mesh 03/25/2014 03/26/2015   Bipolar disorder, unspecified (HCC) 09/01/2014   Back muscle spasm 09/01/2014   Smoking 09/01/2014   Elevated BP 09/01/2014   Atrial fibrillation, new 01/13/2013   Nausea and vomiting in adult 01/13/2013   Hypertension 01/13/2013    Past Surgical History:  Procedure Laterality Date   CLEFT PALATE REPAIR     etopical surgery     LAPAROSCOPY FOR ECTOPIC PREGNANCY  2011   TYMPANOPLASTY Right 08/03/2015   Procedure: RIGHT MEDIAL TECHNIQUE TYMPANOPLASTY;  Surgeon: Flo Shanks, MD;  Location: [facility];  Service: ENT;  Laterality: Right;   VENTRAL HERNIA REPAIR N/A 03/26/2015   Procedure: LAPAROSCOPIC REPAIR OF  INCARCERATED SUPERUMBILICAL HERNIA X2 AND UMBILICAL HERNIA REPAIR WITH MESH;  Surgeon: Karie Soda, MD;  Location: [facility];  Service: General;  Laterality: N/A;     OB History     Gravida  2   Para      Term      Preterm      AB  2   Living  0      SAB  1   IAB  0   Ectopic  1   Multiple  0   Live Births              Family History  Problem Relation Age of Onset   Hypertension Mother    Hypertension Father    Cancer Sister    Heart disease Maternal Grandmother    Cancer Maternal Grandfather    Other Neg Hx    Hearing loss Neg Hx     Social History   Tobacco Use   Smoking status: Former    Packs/day: 0.25    Years: 15.00    Pack years: 3.75    Types: Cigarettes   Smokeless tobacco: Never  Vaping Use   Vaping Use: Never used  Substance Use Topics   Alcohol use: Yes    Alcohol/week: 1.0 standard drink  Types: 1 Standard drinks or equivalent per week    Comment: social   Drug use: No    Home Medications Prior to Admission medications   Medication Sig Start Date End Date Taking? Authorizing Provider  famotidine (PEPCID) 20 MG tablet Take 1 tablet (20 mg total) by mouth 2 (two) times daily. 07/10/21  Yes Rolan Bucco, MD  ondansetron (ZOFRAN) 4 MG tablet Take 1 tablet (4 mg total) by mouth every 6 (six) hours. 07/10/21  Yes Rolan Bucco, MD  chlorpheniramine-HYDROcodone (TUSSIONEX PENNKINETIC ER) 10-8 MG/5ML SUER Take 5 mLs by mouth at bedtime as needed for cough. 04/21/21   Mardella Layman, MD  diclofenac (VOLTAREN) 75 MG EC tablet Take 1 tablet (75 mg total) by mouth 2 (two) times daily. 07/08/19   Hudnall, Azucena Fallen, MD  diclofenac Sodium (VOLTAREN) 1 % GEL Apply 4 g topically 4 (four) times daily. 04/24/20   Darr, Gerilyn Pilgrim, PA-C  ibuprofen (ADVIL) 800 MG tablet Take 1 tablet (800 mg total) by mouth 3 (three) times daily. 04/24/20   Darr, Gerilyn Pilgrim, PA-C  predniSONE (DELTASONE) 10 MG tablet Take as directed. 04/21/21   Mardella Layman, MD    Allergies     Flexeril [cyclobenzaprine]  Review of Systems   Review of Systems  Constitutional:  Negative for chills, diaphoresis, fatigue and fever.  HENT:  Negative for congestion, rhinorrhea and sneezing.   Eyes: Negative.   Respiratory:  Negative for cough, chest tightness and shortness of breath.   Cardiovascular:  Negative for chest pain and leg swelling.  Gastrointestinal:  Positive for abdominal pain, nausea and vomiting. Negative for blood in stool and diarrhea.  Genitourinary:  Negative for difficulty urinating, flank pain, frequency and hematuria.  Musculoskeletal:  Negative for arthralgias and back pain.  Skin:  Negative for rash.  Neurological:  Negative for dizziness, speech difficulty, weakness, numbness and headaches.   Physical Exam Updated Vital Signs BP (!) 176/115 (BP Location: Right Arm)   Pulse 82   Temp 97.8 F (36.6 C) (Oral)   Resp 16   Ht 5' (1.524 m)   Wt 65.8 kg   SpO2 100%   BMI 28.32 kg/m   Physical Exam Constitutional:      Appearance: She is well-developed.  HENT:     Head: Normocephalic and atraumatic.  Eyes:     Pupils: Pupils are equal, round, and reactive to light.  Cardiovascular:     Rate and Rhythm: Normal rate and regular rhythm.     Heart sounds: Normal heart sounds.  Pulmonary:     Effort: Pulmonary effort is normal. No respiratory distress.     Breath sounds: Normal breath sounds. No wheezing or rales.  Chest:     Chest wall: No tenderness.  Abdominal:     General: Bowel sounds are normal.     Palpations: Abdomen is soft.     Tenderness: There is abdominal tenderness (Mild diffuse tenderness). There is no guarding or rebound.  Musculoskeletal:        General: Normal range of motion.     Cervical back: Normal range of motion and neck supple.  Lymphadenopathy:     Cervical: No cervical adenopathy.  Skin:    General: Skin is warm and dry.     Findings: No rash.  Neurological:     Mental Status: She is alert and oriented to person,  place, and time.    ED Results / Procedures / Treatments   Labs (all labs ordered are listed, but only abnormal results  are displayed) Labs Reviewed  COMPREHENSIVE METABOLIC PANEL - Abnormal; Notable for the following components:      Result Value   Glucose, Bld 144 (*)    ALT 50 (*)    All other components within normal limits  CBC WITH DIFFERENTIAL/PLATELET - Abnormal; Notable for the following components:   WBC 16.2 (*)    RBC 5.35 (*)    Hemoglobin 16.0 (*)    HCT 47.7 (*)    Neutro Abs 11.3 (*)    Monocytes Absolute 1.3 (*)    Abs Immature Granulocytes 0.10 (*)    All other components within normal limits  URINALYSIS, ROUTINE W REFLEX MICROSCOPIC - Abnormal; Notable for the following components:   APPearance HAZY (*)    Bacteria, UA RARE (*)    All other components within normal limits  LIPASE, BLOOD  PREGNANCY, URINE    EKG None  Radiology No results found.  Procedures Procedures   Medications Ordered in ED Medications  promethazine (PHENERGAN) 12.5 mg in sodium chloride 0.9 % 50 mL IVPB (12.5 mg Intravenous Total Dose 07/10/21 1119)  sodium chloride 0.9 % bolus 1,000 mL (0 mLs Intravenous Stopped 07/10/21 1058)  ondansetron (ZOFRAN) injection 4 mg (4 mg Intravenous Given 07/10/21 0951)  pantoprazole (PROTONIX) injection 40 mg (40 mg Intravenous Given 07/10/21 0951)  promethazine (PHENERGAN) 25 MG/ML injection (12.5 mg Intravenous Given 07/10/21 1117)  sodium chloride 0.9 % bolus 1,000 mL (0 mLs Intravenous Stopped 07/10/21 1409)  LORazepam (ATIVAN) injection 1 mg (1 mg Intravenous Given 07/10/21 1305)    ED Course  I have reviewed the triage vital signs and the nursing notes.  Pertinent labs & imaging results that were available during my care of the patient were reviewed by me and considered in my medical decision making (see chart for details).    MDM Rules/Calculators/A&P                           Patient is a 40 year old female who presents with vomiting and  abdominal pain after drinking a large amount of alcohol.  She got IV fluids, Protonix and antiemetics.  She is feeling better.  Her abdominal pain is resolved.  She required 3 different doses of antiemetics.  She is feeling better but still nauseated.  She has been drinking and has had no ongoing vomiting but still is nauseated.  Dr. Deretha Emory did take over care.  If she feels better, she can likely go home.  I did get her prescription for Pepcid and Zofran.  She does not currently have any abdominal pain that would warrant imaging.  Her labs are nonconcerning.  There is no evidence of pancreatitis.  No significant elevation in her LFTs. Final Clinical Impression(s) / ED Diagnoses Final diagnoses:  Acute gastritis without hemorrhage, unspecified gastritis type    Rx / DC Orders ED Discharge Orders          Ordered    famotidine (PEPCID) 20 MG tablet  2 times daily        07/10/21 1537    ondansetron (ZOFRAN) 4 MG tablet  Every 6 hours        07/10/21 1537             Rolan Bucco, MD 07/10/21 1539

## 2021-07-10 NOTE — ED Notes (Addendum)
Pt was vomiting approx of brownish color emesis. Medicated per order. Pt had vomited all in the floor and on her blanket. Clean blanket provided and room cleaned.

## 2021-07-10 NOTE — ED Provider Notes (Signed)
Patient initially improved more so with the Phenergan.  But then had recurrence of symptoms.  Patient read treated with Zofran and Phenergan again got a little bit better.  We decided to add on Haldol.  Patient does have a ride home.  The Haldol helped significantly.  Patient without any electrolyte abnormalities.  Patient will be discharged home with Phenergan Pepcid and the Zofran.   Vanetta Mulders, MD 07/10/21 [phone]

## 2021-07-14 ENCOUNTER — Other Ambulatory Visit: Payer: Self-pay

## 2021-07-14 ENCOUNTER — Emergency Department (HOSPITAL_COMMUNITY)
Admission: EM | Admit: 2021-07-14 | Discharge: 2021-07-14 | Disposition: A | Discharge status: Home / Self Care | Payer: No Typology Code available for payment source | Attending: Emergency Medicine | Admitting: Emergency Medicine

## 2021-07-14 ENCOUNTER — Emergency Department (HOSPITAL_COMMUNITY): Payer: No Typology Code available for payment source

## 2021-07-14 ENCOUNTER — Encounter (HOSPITAL_COMMUNITY): Payer: Self-pay | Admitting: *Deleted

## 2021-07-14 DIAGNOSIS — Z87891 Personal history of nicotine dependence: Secondary | ICD-10-CM | POA: Diagnosis not present

## 2021-07-14 DIAGNOSIS — R1010 Upper abdominal pain, unspecified: Secondary | ICD-10-CM

## 2021-07-14 DIAGNOSIS — R110 Nausea: Secondary | ICD-10-CM

## 2021-07-14 DIAGNOSIS — E860 Dehydration: Secondary | ICD-10-CM

## 2021-07-14 DIAGNOSIS — O0091 Unspecified ectopic pregnancy with intrauterine pregnancy: Secondary | ICD-10-CM | POA: Diagnosis not present

## 2021-07-14 DIAGNOSIS — D72829 Elevated white blood cell count, unspecified: Secondary | ICD-10-CM | POA: Diagnosis not present

## 2021-07-14 DIAGNOSIS — K219 Gastro-esophageal reflux disease without esophagitis: Secondary | ICD-10-CM | POA: Diagnosis not present

## 2021-07-14 DIAGNOSIS — K828 Other specified diseases of gallbladder: Secondary | ICD-10-CM | POA: Diagnosis not present

## 2021-07-14 DIAGNOSIS — I10 Essential (primary) hypertension: Secondary | ICD-10-CM | POA: Insufficient documentation

## 2021-07-14 DIAGNOSIS — R109 Unspecified abdominal pain: Secondary | ICD-10-CM | POA: Diagnosis present

## 2021-07-14 LAB — COMPREHENSIVE METABOLIC PANEL
ALT: 45 U/L — ABNORMAL HIGH (ref 0–44)
AST: 30 U/L (ref 15–41)
Albumin: 4.3 g/dL (ref 3.5–5.0)
Alkaline Phosphatase: 72 U/L (ref 38–126)
Anion gap: 12 (ref 5–15)
BUN: 9 mg/dL (ref 6–20)
CO2: 22 mmol/L (ref 22–32)
Calcium: 9.8 mg/dL (ref 8.9–10.3)
Chloride: 99 mmol/L (ref 98–111)
Creatinine, Ser: 0.67 mg/dL (ref 0.44–1.00)
GFR, Estimated: >60 mL/min (ref >60)
Glucose, Bld: 115 mg/dL — ABNORMAL HIGH (ref 70–99)
Potassium: 3.7 mmol/L (ref 3.5–5.1)
Sodium: 133 mmol/L — ABNORMAL LOW (ref 135–145)
Total Bilirubin: 1 mg/dL (ref 0.3–1.2)
Total Protein: 7.9 g/dL (ref 6.5–8.1)

## 2021-07-14 LAB — URINALYSIS, ROUTINE W REFLEX MICROSCOPIC
Glucose, UA: NEGATIVE mg/dL
Ketones, ur: 40 mg/dL — AB
Leukocytes,Ua: NEGATIVE
Nitrite: NEGATIVE
Protein, ur: 30 mg/dL — AB
Specific Gravity, Urine: >1.03 — ABNORMAL HIGH (ref 1.005–1.030)
pH: 6 (ref 5.0–8.0)

## 2021-07-14 LAB — URINALYSIS, MICROSCOPIC (REFLEX)

## 2021-07-14 LAB — CBC
HCT: 50 % — ABNORMAL HIGH (ref 36.0–46.0)
Hemoglobin: 16.9 g/dL — ABNORMAL HIGH (ref 12.0–15.0)
MCH: 30 pg (ref 26.0–34.0)
MCHC: 33.8 g/dL (ref 30.0–36.0)
MCV: 88.7 fL (ref 80.0–100.0)
Platelets: 411 10*3/uL — ABNORMAL HIGH (ref 150–400)
RBC: 5.64 MIL/uL — ABNORMAL HIGH (ref 3.87–5.11)
RDW: 13.8 % (ref 11.5–15.5)
WBC: 12 10*3/uL — ABNORMAL HIGH (ref 4.0–10.5)
nRBC: 0 % (ref 0.0–0.2)

## 2021-07-14 LAB — I-STAT BETA HCG BLOOD, ED (MC, WL, AP ONLY): I-stat hCG, quantitative: <5 m[IU]/mL (ref <5)

## 2021-07-14 LAB — LIPASE, BLOOD: Lipase: 20 U/L (ref 11–51)

## 2021-07-14 MED ORDER — METOCLOPRAMIDE HCL 5 MG/ML IJ SOLN
10.0000 mg | Freq: Once | INTRAMUSCULAR | Status: AC
  Administered 2021-07-14: 10 mg via INTRAVENOUS
  Filled 2021-07-14: qty 2

## 2021-07-14 MED ORDER — SODIUM CHLORIDE 0.9 % IV BOLUS
1000.0000 mL | Freq: Once | INTRAVENOUS | Status: AC
  Administered 2021-07-14: 1000 mL via INTRAVENOUS

## 2021-07-14 MED ORDER — ALUM & MAG HYDROXIDE-SIMETH 400-400-40 MG/5ML PO SUSP: 10.0000 mL | Freq: Four times a day (QID) | ORAL | 0 refills | Status: DC | PRN

## 2021-07-14 MED ORDER — ONDANSETRON HCL 4 MG/2ML IJ SOLN
4.0000 mg | Freq: Once | INTRAMUSCULAR | Status: AC
  Administered 2021-07-14: 4 mg via INTRAVENOUS
  Filled 2021-07-14: qty 2

## 2021-07-14 MED ORDER — ALUM & MAG HYDROXIDE-SIMETH 200-200-20 MG/5ML PO SUSP
30.0000 mL | Freq: Once | ORAL | Status: AC
  Administered 2021-07-14: 30 mL via ORAL
  Filled 2021-07-14: qty 30

## 2021-07-14 MED ORDER — PANTOPRAZOLE SODIUM 40 MG IV SOLR
40.0000 mg | Freq: Once | INTRAVENOUS | Status: AC
  Administered 2021-07-14: 40 mg via INTRAVENOUS
  Filled 2021-07-14: qty 40

## 2021-07-14 MED ORDER — LIDOCAINE VISCOUS HCL 2 % MT SOLN
15.0000 mL | Freq: Once | OROMUCOSAL | Status: AC
  Administered 2021-07-14: 15 mL via ORAL
  Filled 2021-07-14: qty 15

## 2021-07-14 MED ORDER — DIPHENHYDRAMINE HCL 50 MG/ML IJ SOLN
25.0000 mg | Freq: Once | INTRAMUSCULAR | Status: AC
  Administered 2021-07-14: 25 mg via INTRAVENOUS
  Filled 2021-07-14: qty 1

## 2021-07-14 MED ORDER — ACETAMINOPHEN 325 MG PO TABS
650.0000 mg | ORAL_TABLET | Freq: Once | ORAL | Status: AC
  Administered 2021-07-14: 650 mg via ORAL
  Filled 2021-07-14: qty 2

## 2021-07-14 MED ORDER — PANTOPRAZOLE SODIUM 20 MG PO TBEC: 20.0000 mg | DELAYED_RELEASE_TABLET | Freq: Every day | ORAL | 0 refills | Status: DC

## 2021-07-14 MED ORDER — METOCLOPRAMIDE HCL 10 MG PO TABS: 10.0000 mg | ORAL_TABLET | Freq: Three times a day (TID) | ORAL | 0 refills | Status: DC | PRN

## 2021-07-14 NOTE — ED Provider Notes (Addendum)
[facility] EMERGENCY DEPARTMENT Provider Note   CSN: [id number] Arrival date & time: 07/14/21  0840     History Chief Complaint  Patient presents with   Abdominal Pain   Weakness    [name] is a 40 y.o. female presenting for evaluation of nausea, vomiting, weakness.  Patient states 5 days ago she consumed a lot of alcohol.  The next day she developed persistent nausea, vomiting, abdominal pain.  Was seen at [facility] ER where she was treated symptomatically for what was presumed to be alcoholic gastritis.  Since then, she has continued to feel poorly.  She is no longer vomiting, but still feels very nauseous.  She feels like there is something stuck in her throat.  She reports intermittent abdominal cramping, but no significant pain.  No fevers or chills.  Decreased urination and bowel movements due to decreased p.o. intake.  She has not had any solid food in several days.  She states that now she feels dizzy and lightheaded, especially when she stands up.  No history of previous abdominal problems or similar issues like this.  She does have a history of heartburn, does not take anything for this.   Additional history obtained per chart review.  History of anxiety, depression, GERD, hypertension, bipolar.  I reviewed recent ER visit.  HPI     Past Medical History:  Diagnosis Date   Anxiety    Back pain, chronic    Depression    hx of    Dysrhythmia 2013   hx of a-fib   Ectopic pregnancy    GERD (gastroesophageal reflux disease)    Headache    occasional    Hypertension    was told, but never on meds   Infection    UTI   Ovarian cyst    Urinary tract infection     Patient Active Problem List   Diagnosis Date Noted   Closed fracture of right ankle 04/13/2018   Ileus (HCC) 03/28/2015   Incarcerated ventral hernia s/p lap repair w mesh 03/25/2014 03/26/2015   Umbilical hernia s/p lap repair w mesh 03/25/2014 03/26/2015   Bipolar  disorder, unspecified (HCC) 09/01/2014   Back muscle spasm 09/01/2014   Smoking 09/01/2014   Elevated BP 09/01/2014   Atrial fibrillation, new 01/13/2013   Nausea and vomiting in adult 01/13/2013   Hypertension 01/13/2013    Past Surgical History:  Procedure Laterality Date   CLEFT PALATE REPAIR     etopical surgery     LAPAROSCOPY FOR ECTOPIC PREGNANCY  2011   TYMPANOPLASTY Right 08/03/2015   Procedure: RIGHT MEDIAL TECHNIQUE TYMPANOPLASTY;  Surgeon: Flo Shanks, MD;  Location: [facility];  Service: ENT;  Laterality: Right;   VENTRAL HERNIA REPAIR N/A 03/26/2015   Procedure: LAPAROSCOPIC REPAIR OF INCARCERATED SUPERUMBILICAL HERNIA X2 AND UMBILICAL HERNIA REPAIR WITH MESH;  Surgeon: Karie Soda, MD;  Location: [facility];  Service: General;  Laterality: N/A;     OB History     Gravida  2   Para      Term      Preterm      AB  2   Living  0      SAB  1   IAB  0   Ectopic  1   Multiple  0   Live Births              Family History  Problem Relation Age of Onset   Hypertension  Mother    Hypertension Father    Cancer Sister    Heart disease Maternal Grandmother    Cancer Maternal Grandfather    Other Neg Hx    Hearing loss Neg Hx     Social History   Tobacco Use   Smoking status: Former    Packs/day: 0.25    Years: 15.00    Pack years: 3.75    Types: Cigarettes   Smokeless tobacco: Never  Vaping Use   Vaping Use: Never used  Substance Use Topics   Alcohol use: Yes    Alcohol/week: 1.0 standard drink    Types: 1 Standard drinks or equivalent per week    Comment: social   Drug use: No    Home Medications Prior to Admission medications   Medication Sig Start Date End Date Taking? Authorizing Provider  alum & mag hydroxide-simeth (MAALOX MAX) 400-400-40 MG/5ML suspension Take 10 mLs by mouth every 6 (six) hours as needed for indigestion. 07/14/21  Yes Josette Shimabukuro, PA-C  estradiol (VIVELLE-DOT) 0.05 MG/24HR patch Place 1  patch onto the skin 2 (two) times a week. 05/27/21  Yes [provider]  famotidine (PEPCID) 20 MG tablet Take 1 tablet (20 mg total) by mouth 2 (two) times daily. 07/10/21  Yes Rolan Bucco, MD  metoCLOPramide (REGLAN) 10 MG tablet Take 1 tablet (10 mg total) by mouth every 8 (eight) hours as needed for nausea. 07/14/21  Yes Lanee Chain, PA-C  pantoprazole (PROTONIX) 20 MG tablet Take 1 tablet (20 mg total) by mouth daily. 07/14/21  Yes Girard Koontz, PA-C  progesterone (PROMETRIUM) 100 MG capsule Take 100 mg by mouth at bedtime. 07/05/21  Yes [provider]  chlorpheniramine-HYDROcodone (TUSSIONEX PENNKINETIC ER) 10-8 MG/5ML SUER Take 5 mLs by mouth at bedtime as needed for cough. Patient not taking: Reported on 07/14/2021 04/21/21   Mardella Layman, MD  diclofenac (VOLTAREN) 75 MG EC tablet Take 1 tablet (75 mg total) by mouth 2 (two) times daily. Patient not taking: Reported on 07/14/2021 07/08/19   Lenda Kelp, MD  diclofenac Sodium (VOLTAREN) 1 % GEL Apply 4 g topically 4 (four) times daily. Patient not taking: Reported on 07/14/2021 04/24/20   Darr, Gerilyn Pilgrim, PA-C  ibuprofen (ADVIL) 800 MG tablet Take 1 tablet (800 mg total) by mouth 3 (three) times daily. Patient not taking: No sig reported 04/24/20   Darr, Gerilyn Pilgrim, PA-C  ondansetron (ZOFRAN) 4 MG tablet Take 1 tablet (4 mg total) by mouth every 6 (six) hours. Patient not taking: No sig reported 07/10/21   Rolan Bucco, MD  predniSONE (DELTASONE) 10 MG tablet Take as directed. Patient not taking: No sig reported 04/21/21   Mardella Layman, MD  promethazine (PHENERGAN) 25 MG tablet Take 1 tablet (25 mg total) by mouth every 6 (six) hours as needed for nausea or vomiting. Patient not taking: No sig reported 07/10/21   Vanetta Mulders, MD    Allergies    Flexeril [cyclobenzaprine]  Review of Systems   Review of Systems  Constitutional:  Positive for appetite change.  Gastrointestinal:  Positive for abdominal pain (resolved)  and nausea.  Neurological:  Positive for dizziness (when standing).  All other systems reviewed and are negative.  Physical Exam Updated Vital Signs BP (!) 183/114 (BP Location: Left Arm)   Pulse 92   Temp 99.3 F (37.4 C) (Oral)   Resp 10   Ht 5' (1.524 m)   Wt 65.8 kg   SpO2 100%   BMI 28.32 kg/m   Physical  Exam Vitals and nursing note reviewed.  Constitutional:      General: She is not in acute distress.    Appearance: Normal appearance.     Comments: Resting in bed in no acute distress  HENT:     Head: Normocephalic and atraumatic.  Eyes:     Conjunctiva/sclera: Conjunctivae normal.     Pupils: Pupils are equal, round, and reactive to light.  Cardiovascular:     Rate and Rhythm: Normal rate and regular rhythm.     Pulses: Normal pulses.  Pulmonary:     Effort: Pulmonary effort is normal. No respiratory distress.     Breath sounds: Normal breath sounds. No wheezing.     Comments: Speaking in full sentences.  Clear lung sounds in all fields. Abdominal:     General: There is no distension.     Palpations: Abdomen is soft. There is no mass.     Tenderness: There is abdominal tenderness. There is no guarding or rebound.     Comments: Tenderness palpation of epigastric abdomen.  No significant tenderness palpation elsewhere in the abdomen.  Negative Murphy's.  No rigidity, guarding, distention.  No peritonitis  Musculoskeletal:        General: Normal range of motion.     Cervical back: Normal range of motion and neck supple.  Skin:    General: Skin is warm and dry.     Capillary Refill: Capillary refill takes less than 2 seconds.  Neurological:     Mental Status: She is alert and oriented to person, place, and time.  Psychiatric:        Mood and Affect: Mood and affect normal.        Speech: Speech normal.        Behavior: Behavior normal.    ED Results / Procedures / Treatments   Labs (all labs ordered are listed, but only abnormal results are displayed) Labs  Reviewed  COMPREHENSIVE METABOLIC PANEL - Abnormal; Notable for the following components:      Result Value   Sodium 133 (*)    Glucose, Bld 115 (*)    ALT 45 (*)    All other components within normal limits  URINALYSIS, ROUTINE W REFLEX MICROSCOPIC - Abnormal; Notable for the following components:   Color, Urine AMBER (*)    APPearance HAZY (*)    Specific Gravity, Urine >1.030 (*)    Hgb urine dipstick TRACE (*)    Bilirubin Urine SMALL (*)    Ketones, ur 40 (*)    Protein, ur 30 (*)    All other components within normal limits  CBC - Abnormal; Notable for the following components:   WBC 12.0 (*)    RBC 5.64 (*)    Hemoglobin 16.9 (*)    HCT 50.0 (*)    Platelets 411 (*)    All other components within normal limits  URINALYSIS, MICROSCOPIC (REFLEX) - Abnormal; Notable for the following components:   Bacteria, UA MANY (*)    Non Squamous Epithelial PRESENT (*)    All other components within normal limits  LIPASE, BLOOD  I-STAT BETA HCG BLOOD, ED ([facility], [facility], [facility] ONLY)    EKG None  Radiology US Abdomen Limited RUQ (LIVER/GB)  Result Date: 07/14/2021 CLINICAL DATA:  Upper abdominal pain EXAM: ULTRASOUND ABDOMEN LIMITED RIGHT UPPER QUADRANT COMPARISON:  None. FINDINGS: Gallbladder: Sludge layers in the gallbladder. No visible stones. Gallbladder wall slightly thickened at 3 mm. Common bile duct: Diameter: Normal caliber, 3 mm. Liver: No focal  lesion identified. Within normal limits in parenchymal echogenicity. Portal vein is patent on color Doppler imaging with normal direction of blood flow towards the liver. Other: None. IMPRESSION: Moderate sludge layering in the gallbladder with borderline gallbladder wall thickening. No visible stones or sonographic Murphy sign. Electronically Signed   By: Charlett Nose M.D.   On: 07/14/2021 17:39    Procedures Procedures   Medications Ordered in ED Medications  sodium chloride 0.9 % bolus 1,000 mL (0 mLs Intravenous Stopped 07/14/21 1912)   ondansetron (ZOFRAN) injection 4 mg (4 mg Intravenous Given 07/14/21 1754)  pantoprazole (PROTONIX) injection 40 mg (40 mg Intravenous Given 07/14/21 1754)  alum & mag hydroxide-simeth (MAALOX/MYLANTA) 200-200-20 MG/5ML suspension 30 mL (30 mLs Oral Given 07/14/21 1754)    And  lidocaine (XYLOCAINE) 2 % viscous mouth solution 15 mL (15 mLs Oral Given 07/14/21 1754)  acetaminophen (TYLENOL) tablet 650 mg (650 mg Oral Given 07/14/21 1808)  metoCLOPramide (REGLAN) injection 10 mg (10 mg Intravenous Given 07/14/21 1912)  diphenhydrAMINE (BENADRYL) injection 25 mg (25 mg Intravenous Given 07/14/21 1912)  sodium chloride 0.9 % bolus 1,000 mL (1,000 mLs Intravenous New Bag/Given 07/14/21 1915)    ED Course  I have reviewed the triage vital signs and the nursing notes.  Pertinent labs & imaging results that were available during my care of the patient were reviewed by me and considered in my medical decision making (see chart for details).    MDM Rules/Calculators/A&P                           Patient presented for evaluation of nausea, decreased p.o. intake, weakness.  She also initially had abdominal pain, but this is mostly improved.  On exam, patient appears nontoxic.  She does have some mild epigastric tenderness.  While this could be residual gastritis, also consider pancreatitis, gallbladder etiology, viral GI illness, GERD, PUD.  Labs obtained from triage interpreted by me, overall reassuring.  Patient does have a mild leukocytosis of 12, however this is improved from a few days ago.  Urine does show many bacteria, however there is also squamous cells and no nitrites or leuks.  Without urinary symptoms, doubt UTI.  Lipase and CMP overall reassuring.  In the setting of persistent symptoms and epigastric pain, will obtain right upper quadrant ultrasound to rule out gallbladder etiology.  However favor GERD versus gastritis versus PUD at this time.  Will treat symptomatically and reassess.  On  assessment, patient reports headache and feeling that dysphagia is improved, however she is still having nausea and vomiting.  Will give further antiemetics and reassess.  On reassessment, patient reports she is feeling much better.  She reports she is still belching, however no further nausea or vomiting.  No pain.  I discussed continued symptomatic treatment at home.  Encourage follow-up with GI.  Discussed findings of gallbladder sludge on the ultrasound.  While there is possibly some gallbladder wall thickening, without a fever, white count, or focal right upper quadrant pain, doubt acute cholecystitis.  I still favor GERD versus PUD versus gastritis.  Discussed dietary changes to limit symptoms.  At this time, patient appears safe for discharge.  Return precautions given.  Patient states she understands and agrees to plan.  At discharge, patient had some mild nausea/spitting up some of the viscous lidocaine.  She states she still continues to feel well and would like to go home and try the medications outpatient.  I discussed low  threshold for return for continued symptoms.  Final Clinical Impression(s) / ED Diagnoses Final diagnoses:  Upper abdominal pain  Nausea  Dehydration  Gallbladder sludge    Rx / DC Orders ED Discharge Orders          Ordered    pantoprazole (PROTONIX) 20 MG tablet  Daily        07/14/21 2020    alum & mag hydroxide-simeth (MAALOX MAX) 400-400-40 MG/5ML suspension  Every 6 hours PRN        07/14/21 2020    metoCLOPramide (REGLAN) 10 MG tablet  Every 8 hours PRN        07/14/21 2020             Gradyn Shein, PA-C 07/14/21 2028    Alveria Apley, PA-C 07/14/21 2038    Benjiman Core, MD 07/14/21 [phone]

## 2021-07-14 NOTE — ED Triage Notes (Signed)
Pt states epigastric pain and recently tx for gastritis d/t etoh consumption.  Pt states was sent home from work for weakness.

## 2021-07-14 NOTE — Discharge Instructions (Addendum)
Take Protonix daily for the next 2 weeks. You may also take Pepcid as needed for further symptoms of heartburn. Use Maalox as needed for breakthrough pain, including pain from heartburn, discomfort in your throat (like there is something there), or persistent nausea. Use either Zofran or Reglan as needed for nausea or vomiting. Make sure you are staying well-hydrated water. Slowly progress your diet after 24 hours, starting with bland foods and working your way up to a regular diet. Your ultrasound today showed sludge in the gallbladder, which is a thick liquid.  If you develop fevers, severe right upper quadrant abdominal pain, or persistent vomiting, return to the ER for further evaluation. Call the GI doctor listed below to set up a follow-up appointment. Return to the emergency room with any new, worsening, concerning symptoms

## 2021-07-15 ENCOUNTER — Encounter: Payer: Self-pay | Admitting: Gastroenterology

## 2021-09-10 ENCOUNTER — Ambulatory Visit (INDEPENDENT_AMBULATORY_CARE_PROVIDER_SITE_OTHER): Payer: No Typology Code available for payment source | Admitting: Gastroenterology

## 2021-09-10 ENCOUNTER — Encounter: Payer: Self-pay | Admitting: Gastroenterology

## 2021-09-10 VITALS — BP 112/72 | HR 83 | Ht 60.0 in | Wt 135.4 lb

## 2021-09-10 DIAGNOSIS — R197 Diarrhea, unspecified: Secondary | ICD-10-CM | POA: Diagnosis not present

## 2021-09-10 NOTE — Progress Notes (Signed)
HPI: This is a very pleasant 40 year old woman who was referred to me by Charlane Ferretti, DO  to evaluate recent nausea, vomiting, persistent diarrhea, abnormal gallbladder..    She was in the emergency room twice in early August.  This was after an episode of binge drinking for about 24 hours.  She was just feeling depressed.  Alcohol is normally not much of an issue for her she will generally have 2-3 beers per week.  She does not consider herself an alcoholic.  Seems like this episode of binge drinking really cause some gastrointestinal upset.  It took her about 2 weeks to recover from this but her nausea, her vomiting, or diarrhea and her abdominal discomforts all completely resolved.  She has not had a return of the nausea and the vomiting or the abdominal pains.  She has however started to have diarrhea again.  This started about 2 weeks ago.  She also started drinking red bull 2 weeks ago.  Imodium is not helping.  She has not been on any antibiotics.  She does not drink diet or sugar-free products.  She is only taking Advil, progesterone and estrogen.  Her weight is down overall about 16 pounds slowly increasing lately after her acute illness in early August.  She eats about once a day.  She is quite hyperactive in the room.  She says that is normal for her.  She has not had a red bull yet today.  Old Data Reviewed:   She was in the emergency room twice in early August.  Blood work during the visit showed slightly elevated white count at 16,000 and then 12,000.  Hemoglobin was normal.  Lipase was normal.  Complete metabolic profile was normal.  UA was positive.  Ultrasound of right upper quadrant showed "moderate sludge layering in the gallbladder with borderline gallbladder wall thickening.  No visible stones."   It looks like these ER visits were after she consumed a large amount of alcohol.  She was having nausea, vomiting and weakness.  She had not eaten well for quite a while.  ER  team felt she had "GERD versus gastritis versus PUD".  She was sent home on pantoprazole 20 mg once daily, Maalox, Reglan.   Review of systems: Pertinent positive and negative review of systems were noted in the above HPI section. All other review negative.   Past Medical History:  Diagnosis Date   Anxiety    Back pain, chronic    Depression    hx of    Dysrhythmia 2013   hx of a-fib   Ectopic pregnancy    GERD (gastroesophageal reflux disease)    Headache    occasional    Hypertension    was told, but never on meds   Infection    UTI   Ovarian cyst    Urinary tract infection     Past Surgical History:  Procedure Laterality Date   CLEFT PALATE REPAIR     etopical surgery     LAPAROSCOPY FOR ECTOPIC PREGNANCY  2011   TYMPANOPLASTY Right 08/03/2015   Procedure: RIGHT MEDIAL TECHNIQUE TYMPANOPLASTY;  Surgeon: Flo Shanks, MD;  Location: [facility];  Service: ENT;  Laterality: Right;   VENTRAL HERNIA REPAIR N/A 03/26/2015   Procedure: LAPAROSCOPIC REPAIR OF INCARCERATED SUPERUMBILICAL HERNIA X2 AND UMBILICAL HERNIA REPAIR WITH MESH;  Surgeon: Karie Soda, MD;  Location: [facility];  Service: General;  Laterality: N/A;    Current Outpatient Medications  Medication Sig Dispense  Refill   estradiol (VIVELLE-DOT) 0.05 MG/24HR patch Place 1 patch onto the skin 2 (two) times a week.     ibuprofen (ADVIL) 800 MG tablet Take 1 tablet (800 mg total) by mouth 3 (three) times daily. 21 tablet 0   ondansetron (ZOFRAN) 4 MG tablet Take 1 tablet (4 mg total) by mouth every 6 (six) hours. 12 tablet 0   pantoprazole (PROTONIX) 20 MG tablet Take 1 tablet (20 mg total) by mouth daily. 14 tablet 0   progesterone (PROMETRIUM) 100 MG capsule Take 100 mg by mouth at bedtime.     No current facility-administered medications for this visit.    Allergies as of 09/10/2021 - Review Complete 07/14/2021  Allergen Reaction Noted   Flexeril [cyclobenzaprine] Hives 02/02/2015    Family  History  Problem Relation Age of Onset   Hypertension Mother    Hypertension Father    Cancer Sister    Heart disease Maternal Grandmother    Cancer Maternal Grandfather    Colon cancer Maternal Grandfather    Other Neg Hx    Hearing loss Neg Hx    Rectal cancer Neg Hx    Stomach cancer Neg Hx    Esophageal cancer Neg Hx     Social History   Socioeconomic History   Marital status: Single    Spouse name: Not on file   Number of children: Not on file   Years of education: Not on file   Highest education level: Not on file  Occupational History   Not on file  Tobacco Use   Smoking status: Former    Packs/day: 0.25    Years: 15.00    Pack years: 3.75    Types: Cigarettes   Smokeless tobacco: Never  Vaping Use   Vaping Use: Never used  Substance and Sexual Activity   Alcohol use: Yes    Alcohol/week: 1.0 standard drink    Types: 1 Standard drinks or equivalent per week    Comment: social   Drug use: No   Sexual activity: Yes    Partners: Male    Birth control/protection: None  Other Topics Concern   Not on file  Social History Narrative   Not on file   Social Determinants of Health   Financial Resource Strain: Not on file  Food Insecurity: Not on file  Transportation Needs: Not on file  Physical Activity: Not on file  Stress: Not on file  Social Connections: Not on file  Intimate Partner Violence: Not on file     Physical Exam: BP 112/72   Pulse 83   Ht 5' (1.524 m)   Wt 135 lb 6 oz (61.4 kg)   SpO2 98%   BMI 26.44 kg/m  Constitutional: generally well-appearing Psychiatric: alert and oriented x3 Eyes: extraocular movements intact Mouth: oral pharynx moist, no lesions Neck: supple no lymphadenopathy Cardiovascular: heart regular rate and rhythm Lungs: clear to auscultation bilaterally Abdomen: soft, nontender, nondistended, no obvious ascites, no peritoneal signs, normal bowel sounds Extremities: no lower extremity edema bilaterally Skin: no  lesions on visible extremities   Assessment and plan: 40 y.o. female with sludge in gallbladder, recent acute gastrointestinal illness, persistent diarrhea.  First I think the sludge in her gallbladder was related to her acute GI illness, not eating for 2 to 3 days, nausea and vomiting.  She does not have biliary type symptoms and her liver tests were all normal.  I do not think this needs to be followed up.  Second her diarrhea  which was part of her acute illness improved but then returned about 2 weeks ago.  This is correlating exactly with when she started drinking red bull on a daily basis.  I think the red bowl might be disagreeing with her and causing a lot of her loose stools.  I recommended she completely stop it for now and call back in 1 week if she is still bothered by diarrhea.  If that is indeed the case then I would likely recommend blood work with thyroid testing, inflammatory markers, CBC, stool testing and if no obvious causes and she is still bothered by diarrhea that she would probably need a colonoscopy.  Please see the "Patient Instructions" section for addition details about the plan.   Rob Bunting, MD [facility] 09/10/2021, 9:31 AM  Cc: Charlane Ferretti, DO  Total time on date of encounter was 45 minutes (this included time spent preparing to see the patient reviewing records; obtaining and/or reviewing separately obtained history; performing a medically appropriate exam and/or evaluation; counseling and educating the patient and family if present; ordering medications, tests or procedures if applicable; and documenting clinical information in the health record).

## 2021-09-10 NOTE — Patient Instructions (Addendum)
If you are age 41 or younger, your body mass index should be between 19-25. Your Body mass index is 26.44 kg/m. If this is out of the aformentioned range listed, please consider follow up with your Primary Care Provider.  __________________________________________________________  The [facility] providers would like to encourage you to use [facility] to communicate with providers for non-urgent requests or questions.  Due to long hold times on the telephone, sending your provider a message by [facility] may be a faster and more efficient way to get a response.  Please allow 48 business hours for a response.  Please remember that this is for non-urgent requests.   Please discontinue drinking Red Bulls.  Please call back to our office in 1 week if you are still being bothered by diarrhea.   Thank you for entrusting me with your care and choosing [facility].  Dr Christella Hartigan

## 2022-07-17 ENCOUNTER — Other Ambulatory Visit: Payer: Self-pay

## 2022-07-17 ENCOUNTER — Observation Stay (HOSPITAL_COMMUNITY)
Admission: EM | Admit: 2022-07-17 | Discharge: 2022-07-19 | Disposition: A | Discharge status: Home / Self Care | Payer: No Typology Code available for payment source | Attending: General Practice | Admitting: General Practice

## 2022-07-17 ENCOUNTER — Emergency Department (HOSPITAL_COMMUNITY): Payer: No Typology Code available for payment source

## 2022-07-17 DIAGNOSIS — I10 Essential (primary) hypertension: Secondary | ICD-10-CM | POA: Diagnosis not present

## 2022-07-17 DIAGNOSIS — R112 Nausea with vomiting, unspecified: Principal | ICD-10-CM | POA: Diagnosis present

## 2022-07-17 DIAGNOSIS — I480 Paroxysmal atrial fibrillation: Secondary | ICD-10-CM | POA: Diagnosis not present

## 2022-07-17 DIAGNOSIS — Z800 Family history of malignant neoplasm of digestive organs: Secondary | ICD-10-CM

## 2022-07-17 DIAGNOSIS — F17210 Nicotine dependence, cigarettes, uncomplicated: Secondary | ICD-10-CM | POA: Diagnosis present

## 2022-07-17 DIAGNOSIS — R739 Hyperglycemia, unspecified: Secondary | ICD-10-CM | POA: Diagnosis present

## 2022-07-17 DIAGNOSIS — Z79899 Other long term (current) drug therapy: Secondary | ICD-10-CM | POA: Insufficient documentation

## 2022-07-17 DIAGNOSIS — K2970 Gastritis, unspecified, without bleeding: Secondary | ICD-10-CM | POA: Diagnosis present

## 2022-07-17 DIAGNOSIS — K449 Diaphragmatic hernia without obstruction or gangrene: Secondary | ICD-10-CM

## 2022-07-17 DIAGNOSIS — R1013 Epigastric pain: Secondary | ICD-10-CM | POA: Diagnosis not present

## 2022-07-17 DIAGNOSIS — I4891 Unspecified atrial fibrillation: Principal | ICD-10-CM | POA: Diagnosis present

## 2022-07-17 DIAGNOSIS — R109 Unspecified abdominal pain: Secondary | ICD-10-CM | POA: Diagnosis present

## 2022-07-17 DIAGNOSIS — K219 Gastro-esophageal reflux disease without esophagitis: Secondary | ICD-10-CM | POA: Diagnosis present

## 2022-07-17 DIAGNOSIS — F319 Bipolar disorder, unspecified: Secondary | ICD-10-CM | POA: Diagnosis present

## 2022-07-17 DIAGNOSIS — F419 Anxiety disorder, unspecified: Secondary | ICD-10-CM | POA: Diagnosis present

## 2022-07-17 DIAGNOSIS — E876 Hypokalemia: Secondary | ICD-10-CM | POA: Diagnosis present

## 2022-07-17 DIAGNOSIS — Z8249 Family history of ischemic heart disease and other diseases of the circulatory system: Secondary | ICD-10-CM

## 2022-07-17 LAB — COMPREHENSIVE METABOLIC PANEL WITH GFR
ALT: 18 U/L (ref 0–44)
AST: 16 U/L (ref 15–41)
Albumin: 4.3 g/dL (ref 3.5–5.0)
Alkaline Phosphatase: 74 U/L (ref 38–126)
Anion gap: 13 (ref 5–15)
BUN: 5 mg/dL — ABNORMAL LOW (ref 6–20)
CO2: 24 mmol/L (ref 22–32)
Calcium: 10.1 mg/dL (ref 8.9–10.3)
Chloride: 105 mmol/L (ref 98–111)
Creatinine, Ser: 0.58 mg/dL (ref 0.44–1.00)
GFR, Estimated: >60 mL/min (ref >60)
Glucose, Bld: 187 mg/dL — ABNORMAL HIGH (ref 70–99)
Potassium: 3.7 mmol/L (ref 3.5–5.1)
Sodium: 142 mmol/L (ref 135–145)
Total Bilirubin: 0.6 mg/dL (ref 0.3–1.2)
Total Protein: 8 g/dL (ref 6.5–8.1)

## 2022-07-17 LAB — TSH: TSH: 2.665 u[IU]/mL (ref 0.350–4.500)

## 2022-07-17 LAB — CBC
HCT: 42.9 % (ref 36.0–46.0)
Hemoglobin: 14.9 g/dL (ref 12.0–15.0)
MCH: 29.9 pg (ref 26.0–34.0)
MCHC: 34.7 g/dL (ref 30.0–36.0)
MCV: 86 fL (ref 80.0–100.0)
Platelets: 458 K/uL — ABNORMAL HIGH (ref 150–400)
RBC: 4.99 MIL/uL (ref 3.87–5.11)
RDW: 13.3 % (ref 11.5–15.5)
WBC: 9.3 K/uL (ref 4.0–10.5)
nRBC: 0 % (ref 0.0–0.2)

## 2022-07-17 LAB — ETHANOL: Alcohol, Ethyl (B): 35 mg/dL — ABNORMAL HIGH (ref <10)

## 2022-07-17 LAB — LIPASE, BLOOD: Lipase: 23 U/L (ref 11–51)

## 2022-07-17 LAB — HIV ANTIBODY (ROUTINE TESTING W REFLEX): HIV Screen 4th Generation wRfx: NONREACTIVE

## 2022-07-17 LAB — I-STAT BETA HCG BLOOD, ED (MC, WL, AP ONLY): I-stat hCG, quantitative: <5 m[IU]/mL (ref <5)

## 2022-07-17 LAB — PHOSPHORUS: Phosphorus: 4.2 mg/dL (ref 2.5–4.6)

## 2022-07-17 LAB — MAGNESIUM: Magnesium: 1.8 mg/dL (ref 1.7–2.4)

## 2022-07-17 MED ORDER — HYDROMORPHONE HCL 1 MG/ML IJ SOLN
0.5000 mg | INTRAMUSCULAR | Status: DC | PRN
  Administered 2022-07-17 – 2022-07-18 (×3): 0.5 mg via INTRAVENOUS
  Filled 2022-07-17 (×3): qty 0.5

## 2022-07-17 MED ORDER — METOCLOPRAMIDE HCL 5 MG/ML IJ SOLN
5.0000 mg | Freq: Three times a day (TID) | INTRAMUSCULAR | Status: DC
  Administered 2022-07-17 – 2022-07-19 (×6): 5 mg via INTRAVENOUS
  Filled 2022-07-17 (×6): qty 2

## 2022-07-17 MED ORDER — LORAZEPAM 2 MG/ML IJ SOLN: 1.0000 mg | INTRAMUSCULAR | Status: DC | PRN

## 2022-07-17 MED ORDER — PANTOPRAZOLE SODIUM 40 MG IV SOLR
40.0000 mg | Freq: Two times a day (BID) | INTRAVENOUS | Status: DC
  Administered 2022-07-17 – 2022-07-18 (×2): 40 mg via INTRAVENOUS
  Filled 2022-07-17 (×2): qty 10

## 2022-07-17 MED ORDER — ONDANSETRON HCL 4 MG/2ML IJ SOLN
4.0000 mg | Freq: Once | INTRAMUSCULAR | Status: AC
  Administered 2022-07-17: 4 mg via INTRAVENOUS
  Filled 2022-07-17: qty 2

## 2022-07-17 MED ORDER — DILTIAZEM HCL-DEXTROSE 125-5 MG/125ML-% IV SOLN (PREMIX)
5.0000 mg/h | INTRAVENOUS | Status: DC
  Administered 2022-07-17 – 2022-07-18 (×2): 5 mg/h via INTRAVENOUS
  Filled 2022-07-17 (×2): qty 125

## 2022-07-17 MED ORDER — SODIUM CHLORIDE 0.9 % IV BOLUS
500.0000 mL | Freq: Once | INTRAVENOUS | Status: AC
  Administered 2022-07-17: 500 mL via INTRAVENOUS

## 2022-07-17 MED ORDER — HYDROMORPHONE HCL 1 MG/ML IJ SOLN
1.0000 mg | Freq: Once | INTRAMUSCULAR | Status: AC
  Administered 2022-07-17: 1 mg via INTRAVENOUS
  Filled 2022-07-17: qty 1

## 2022-07-17 MED ORDER — LORAZEPAM 2 MG/ML IJ SOLN: 0.0000 mg | Freq: Four times a day (QID) | INTRAMUSCULAR | Status: DC

## 2022-07-17 MED ORDER — IOHEXOL 300 MG/ML  SOLN
75.0000 mL | Freq: Once | INTRAMUSCULAR | Status: AC | PRN
  Administered 2022-07-17: 75 mL via INTRAVENOUS

## 2022-07-17 MED ORDER — DILTIAZEM HCL 25 MG/5ML IV SOLN
10.0000 mg | Freq: Once | INTRAVENOUS | Status: AC
  Administered 2022-07-17: 10 mg via INTRAVENOUS
  Filled 2022-07-17: qty 5

## 2022-07-17 MED ORDER — PANTOPRAZOLE SODIUM 40 MG IV SOLR
40.0000 mg | Freq: Once | INTRAVENOUS | Status: AC
  Administered 2022-07-17: 40 mg via INTRAVENOUS
  Filled 2022-07-17: qty 10

## 2022-07-17 MED ORDER — LORAZEPAM 1 MG PO TABS: 1.0000 mg | ORAL_TABLET | ORAL | Status: DC | PRN

## 2022-07-17 MED ORDER — HYDROMORPHONE HCL 1 MG/ML IJ SOLN
1.0000 mg | INTRAMUSCULAR | Status: AC | PRN
  Administered 2022-07-17 (×2): 1 mg via INTRAVENOUS
  Filled 2022-07-17 (×2): qty 1

## 2022-07-17 MED ORDER — APIXABAN 5 MG PO TABS: 5.0000 mg | ORAL_TABLET | Freq: Two times a day (BID) | ORAL | Status: DC

## 2022-07-17 MED ORDER — MOMETASONE FURO-FORMOTEROL FUM 200-5 MCG/ACT IN AERO
2.0000 | INHALATION_SPRAY | Freq: Two times a day (BID) | RESPIRATORY_TRACT | Status: DC
  Administered 2022-07-18 – 2022-07-19 (×3): 2 via RESPIRATORY_TRACT
  Filled 2022-07-17: qty 8.8

## 2022-07-17 MED ORDER — METOCLOPRAMIDE HCL 5 MG/ML IJ SOLN: 5.0000 mg | Freq: Three times a day (TID) | INTRAMUSCULAR | Status: DC

## 2022-07-17 MED ORDER — ACETAMINOPHEN 650 MG RE SUPP: 650.0000 mg | Freq: Four times a day (QID) | RECTAL | Status: DC | PRN

## 2022-07-17 MED ORDER — ALUM & MAG HYDROXIDE-SIMETH 200-200-20 MG/5ML PO SUSP: 30.0000 mL | ORAL | Status: DC | PRN

## 2022-07-17 MED ORDER — HYDROXYZINE HCL 25 MG PO TABS: 25.0000 mg | ORAL_TABLET | Freq: Four times a day (QID) | ORAL | Status: DC | PRN

## 2022-07-17 MED ORDER — QUETIAPINE FUMARATE 100 MG PO TABS
400.0000 mg | ORAL_TABLET | Freq: Every day | ORAL | Status: DC
  Administered 2022-07-17 – 2022-07-18 (×2): 400 mg via ORAL
  Filled 2022-07-17 (×2): qty 4

## 2022-07-17 MED ORDER — PROCHLORPERAZINE EDISYLATE 10 MG/2ML IJ SOLN
10.0000 mg | Freq: Four times a day (QID) | INTRAMUSCULAR | Status: DC | PRN
Start: 2022-07-17 — End: 2022-07-19
  Filled 2022-07-17: qty 2

## 2022-07-17 MED ORDER — METOCLOPRAMIDE HCL 5 MG/ML IJ SOLN
10.0000 mg | Freq: Once | INTRAMUSCULAR | Status: AC
  Administered 2022-07-17: 10 mg via INTRAVENOUS
  Filled 2022-07-17: qty 2

## 2022-07-17 MED ORDER — NALOXONE HCL 0.4 MG/ML IJ SOLN: 0.4000 mg | INTRAMUSCULAR | Status: DC | PRN

## 2022-07-17 MED ORDER — MONTELUKAST SODIUM 10 MG PO TABS
10.0000 mg | ORAL_TABLET | Freq: Every day | ORAL | Status: DC
  Administered 2022-07-17 – 2022-07-18 (×2): 10 mg via ORAL
  Filled 2022-07-17 (×2): qty 1

## 2022-07-17 MED ORDER — ALBUTEROL SULFATE (2.5 MG/3ML) 0.083% IN NEBU
2.5000 mg | INHALATION_SOLUTION | Freq: Four times a day (QID) | RESPIRATORY_TRACT | Status: DC | PRN
Start: 2022-07-17 — End: 2022-07-19

## 2022-07-17 MED ORDER — DILTIAZEM LOAD VIA INFUSION
10.0000 mg | Freq: Once | INTRAVENOUS | Status: AC
  Administered 2022-07-17: 10 mg via INTRAVENOUS
  Filled 2022-07-17: qty 10

## 2022-07-17 MED ORDER — ENOXAPARIN SODIUM 60 MG/0.6ML IJ SOSY
1.0000 mg/kg | PREFILLED_SYRINGE | Freq: Two times a day (BID) | INTRAMUSCULAR | Status: DC
  Administered 2022-07-17 – 2022-07-18 (×3): 57.5 mg via SUBCUTANEOUS
  Filled 2022-07-17 (×2): qty 0.57
  Filled 2022-07-17 (×2): qty 0.6

## 2022-07-17 MED ORDER — FOLIC ACID 1 MG PO TABS: 1.0000 mg | ORAL_TABLET | Freq: Every day | ORAL | Status: DC

## 2022-07-17 MED ORDER — SODIUM CHLORIDE 0.9 % IV SOLN
12.5000 mg | Freq: Once | INTRAVENOUS | Status: AC
  Administered 2022-07-17: 12.5 mg via INTRAVENOUS
  Filled 2022-07-17: qty 0.5

## 2022-07-17 MED ORDER — LIDOCAINE VISCOUS HCL 2 % MT SOLN
15.0000 mL | Freq: Once | OROMUCOSAL | Status: AC
  Administered 2022-07-17: 15 mL via ORAL
  Filled 2022-07-17: qty 15

## 2022-07-17 MED ORDER — LORAZEPAM 2 MG/ML IJ SOLN: 0.0000 mg | Freq: Two times a day (BID) | INTRAMUSCULAR | Status: DC

## 2022-07-17 MED ORDER — THIAMINE HCL 100 MG PO TABS: 100.0000 mg | ORAL_TABLET | Freq: Every day | ORAL | Status: DC

## 2022-07-17 MED ORDER — LACTATED RINGERS IV BOLUS
1000.0000 mL | Freq: Once | INTRAVENOUS | Status: AC
  Administered 2022-07-17: 1000 mL via INTRAVENOUS

## 2022-07-17 MED ORDER — ADULT MULTIVITAMIN W/MINERALS CH: 1.0000 | ORAL_TABLET | Freq: Every day | ORAL | Status: DC

## 2022-07-17 MED ORDER — THIAMINE HCL 100 MG/ML IJ SOLN: 100.0000 mg | Freq: Every day | INTRAMUSCULAR | Status: DC

## 2022-07-17 MED ORDER — ACETAMINOPHEN 325 MG PO TABS
650.0000 mg | ORAL_TABLET | Freq: Four times a day (QID) | ORAL | Status: DC | PRN
  Administered 2022-07-18: 650 mg via ORAL
  Filled 2022-07-17: qty 2

## 2022-07-17 MED ORDER — ALUM & MAG HYDROXIDE-SIMETH 200-200-20 MG/5ML PO SUSP
30.0000 mL | Freq: Once | ORAL | Status: AC
  Administered 2022-07-17: 30 mL via ORAL
  Filled 2022-07-17: qty 30

## 2022-07-17 NOTE — H&P (Addendum)
History and Physical    Patient: [name] TZG:[id number] DOB: 28-Jul-1981 DOA: 07/17/2022 DOS: the patient was seen and examined on 07/17/2022 PCP: Charlane Ferretti, DO  Patient coming from: Home  Chief Complaint:  Chief Complaint  Patient presents with   Abdominal Pain   HPI: [name] is a 41 y.o. female with medical history significant of paroxysmal atrial fibrillation not on anticoagulation, anxiety, and depression presents with complaints of abdominal pain with nausea and vomiting.  She had been out drinking socially at [employer] yesterday evening.  She reported having 2 [place] ice teas and a blue motorcycle prior to developing "twisting" epigastric abdominal pain that she rated as a 10 out of 10 on pain scale.  Thereafter she developed nausea and vomiting.  Emesis was reported to be nonbloody in appearance.  Patient only drinks socially and estimates drinking 3-4 drinks per month on average.  She denies drinking alcohol on a daily basis.  She previously had a history of atrial fibrillation for which was reported back in her early 2s.  She had been prescribed Cardizem at that time, but reports only taking it for 1 month possibly has she lacked insurance at that time and medication was very expensive.  To her knowledge she had not had any reoccurrence of this until today.  1 admission to the emergency department patient was seen to be afebrile with respirations 13-33, heart rate elevated up to 125 in atrial fibrillation, blood pressures 98/59-163/119, and O2 saturations maintained on room air.  Labs significant for platelets 458, glucose 187, lipase 23, and alcohol level 35. CT scan of the abdomen pelvis  noted small hiatal hernia without any other significant abnormality. Patient has been given 2 L of lactated Ringer's, Dilaudid IV, Reglan, Zofran, Phenergan, GI cocktail, Protonix 40 mg IV, Eliquis p.o., and started on a Cardizem drip after 10 mg bolus.  Patient had  nausea and vomiting and was unable to keep medications down.  Review of Systems: As mentioned in the history of present illness. All other systems reviewed and are negative. Past Medical History:  Diagnosis Date   Anxiety    Back pain, chronic    Depression    hx of    Dysrhythmia 2013   hx of a-fib   Ectopic pregnancy    GERD (gastroesophageal reflux disease)    Headache    occasional    Hypertension    was told, but never on meds   Infection    UTI   Ovarian cyst    Urinary tract infection    Past Surgical History:  Procedure Laterality Date   CLEFT PALATE REPAIR     etopical surgery     LAPAROSCOPY FOR ECTOPIC PREGNANCY  2011   TYMPANOPLASTY Right 08/03/2015   Procedure: RIGHT MEDIAL TECHNIQUE TYMPANOPLASTY;  Surgeon: Flo Shanks, MD;  Location: [facility];  Service: ENT;  Laterality: Right;   VENTRAL HERNIA REPAIR N/A 03/26/2015   Procedure: LAPAROSCOPIC REPAIR OF INCARCERATED SUPERUMBILICAL HERNIA X2 AND UMBILICAL HERNIA REPAIR WITH MESH;  Surgeon: Karie Soda, MD;  Location: [facility];  Service: General;  Laterality: N/A;   Social History:  reports that she has been smoking cigarettes. She has a 3.75 pack-year smoking history. She has never used smokeless tobacco. She reports current alcohol use of about 1.0 standard drink of alcohol per week. She reports that she does not use drugs.  Allergies  Allergen Reactions   Flexeril [Cyclobenzaprine] Hives    Family  History  Problem Relation Age of Onset   Hypertension Mother    Hypertension Father    Cancer Sister    Heart disease Maternal Grandmother    Cancer Maternal Grandfather    Colon cancer Maternal Grandfather    Other Neg Hx    Hearing loss Neg Hx    Rectal cancer Neg Hx    Stomach cancer Neg Hx    Esophageal cancer Neg Hx     Prior to Admission medications   Medication Sig Start Date End Date Taking? Authorizing Provider  estradiol (VIVELLE-DOT) 0.05 MG/24HR patch Place 1 patch onto the  skin 2 (two) times a week. 05/27/21   [provider]  ibuprofen (ADVIL) 800 MG tablet Take 1 tablet (800 mg total) by mouth 3 (three) times daily. 04/24/20   Darr, Gerilyn Pilgrim, PA-C  ondansetron (ZOFRAN) 4 MG tablet Take 1 tablet (4 mg total) by mouth every 6 (six) hours. 07/10/21   Rolan Bucco, MD  pantoprazole (PROTONIX) 20 MG tablet Take 1 tablet (20 mg total) by mouth daily. 07/14/21   Caccavale, Sophia, PA-C  progesterone (PROMETRIUM) 100 MG capsule Take 100 mg by mouth at bedtime. 07/05/21   [provider]    Physical Exam: Vitals:   07/17/22 1015 07/17/22 1027 07/17/22 1045 07/17/22 1100  BP: 102/80  102/75 (!) 98/59  Pulse: 89   98  Resp: 17  (!) 26 11  Temp:  98.7 F (37.1 C)    TempSrc:  Oral    SpO2: 99%   99%  Exam  Constitutional: Middle-aged female who appears to be in no acute distress Eyes: PERRL, lids and conjunctivae normal ENMT: Mucous membranes are moist. Posterior pharynx clear of any exudate or lesions  Neck: normal, supple, no masses, no thyromegaly Respiratory: clear to auscultation bilaterally, no wheezing, no crackles. Normal respiratory effort. No accessory muscle use.  Cardiovascular: Regular rate and rhythm, no murmurs / rubs / gallops. No extremity edema. 2+ pedal pulses. No carotid bruits.  Abdomen: no tenderness, no masses palpated. No hepatosplenomegaly. Bowel sounds positive.  Musculoskeletal: no clubbing / cyanosis. No joint deformity upper and lower extremities. Good ROM, no contractures. Normal muscle tone.  Skin: no rashes, lesions, ulcers. No induration Neurologic: CN 2-12 grossly intact. Sensation intact, DTR normal. Strength 5/5 in all 4.  Psychiatric: Normal judgment and insight. Alert and oriented x 3. Normal mood.   Data Reviewed:  EKG appears to revealed atrial fibrillation at 128 bpm.  Assessment and Plan: Nausea and vomiting epigastric abdominal pain Acute.  Patient presents after having new onset of nausea and vomiting  after drinking alcohol.  Labs noted lipase within normal limits.  CT scan noted a small hiatal hernia, but no other acute abnormality. Question possibility of a gastritis as a cause of symptoms.  Patient had been given IV fluids, Protonix IV, and multiple antiemetics without significant improvement in symptoms. -Admit to a progressive bed -Aspiration precautions with elevation in bed -Clear liquid diet and advance diet as tolerated -Reglan 5 mg IV every 8 hours -Dilaudid IV for pain -Antiemetics as needed -Continue Protonix IV twice daily    Paroxysmal atrial fibrillation Patient presented in atrial fibrillation with heart rates elevated up to 128.  Prior history of being in atrial fibrillation approximately 20 years ago just temporarily on Cardizem, but discontinued due to cost.  She had not been on anticoagulation.  Patient has been given an bolus of 10 mg of Cardizem IV, and then placed on a drip.  Patient  had been ordered Eliquis, but was unable to keep it down. CHA2DS2-VASc score = 2( sex and HTN) -Lovenox per pharmacy -Continue Cardizem drip -Check TSH and magnesium level -Goal potassium at least 4 and magnesium at least 2 -Check echocardiogram -Cardiology consulted, we will follow-up for any further recommendations  Essential hypertension Blood pressures currently maintained.  Home medication include amlodipine 5 mg daily. -Held amlodipine at this time due to nausea and vomiting  Hyperglycemia  Acute.  Glucose 189 on admission.  No prior history of diabetes. -Add on hemoglobin A1c  Bipolar disorder/anxiety and depression  -Continue Seroquel nightly once able  Hiatal hernia GERD Incidentally noted to have small hiatal hernia on CT scan.  Patient had not been on any antacid medication at home -Continue Protonix IV for now  Advance Care Planning:   Code Status: Full Code   Consults: Cardiology  Family Communication: Declined need to update family when asked  Severity of  Illness: The appropriate patient status for this patient is OBSERVATION. Observation status is judged to be reasonable and necessary in order to provide the required intensity of service to ensure the patient's safety. The patient's presenting symptoms, physical exam findings, and initial radiographic and laboratory data in the context of their medical condition is felt to place them at decreased risk for further clinical deterioration. Furthermore, it is anticipated that the patient will be medically stable for discharge from the hospital within 2 midnights of admission.   Author: Clydie Braun, MD 07/17/2022 11:32 AM  For on call review [email or web address].

## 2022-07-17 NOTE — ED Notes (Addendum)
Pt having vomiting episode and her Heart rate going up to 145bpm. MD made aware

## 2022-07-17 NOTE — ED Triage Notes (Addendum)
Pt reports generalized abdominal pain, vomiting, and diarrhea, onset tonight. Pt restless, unable to sit still in triage.

## 2022-07-17 NOTE — ED Notes (Signed)
ED TO INPATIENT HANDOFF REPORT  ED Nurse Name and Phone #: Paulino Rily [phone]   S Name/Age/Gender Jannette Fogo Genova 41 y.o. female Room/Bed: 022C/022C  Code Status   Code Status: Full Code  Home/SNF/Other Home Patient oriented to: self, place, time, and situation Is this baseline? Yes   Triage Complete: Triage complete  Chief Complaint Nausea and vomiting [R11.2]  Triage Note Pt reports generalized abdominal pain, vomiting, and diarrhea, onset tonight. Pt restless, unable to sit still in triage.    Allergies Allergies  Allergen Reactions   Flexeril [Cyclobenzaprine] Hives   Lamotrigine Hives    Level of Care/Admitting Diagnosis ED Disposition     ED Disposition  Admit   Condition  --   Comment  Hospital Area: [facility] [[id number]]  Level of Care: Progressive [102]  Admit to Progressive based on following criteria: CARDIOVASCULAR & THORACIC of moderate stability with acute coronary syndrome symptoms/low risk myocardial infarction/hypertensive urgency/arrhythmias/heart failure potentially compromising stability and stable post cardiovascular intervention patients.  May place patient in observation at [facility] or [facility] if equivalent level of care is available:: No  Covid Evaluation: Asymptomatic - no recent exposure (last 10 days) testing not required  Diagnosis: Nausea and vomiting [744752]  Admitting Physician: Clydie Braun [[id number]]  Attending Physician: Clydie Braun [[id number]]          B Medical/Surgery History Past Medical History:  Diagnosis Date   Anxiety    Back pain, chronic    Depression    hx of    Dysrhythmia 2013   hx of a-fib   Ectopic pregnancy    GERD (gastroesophageal reflux disease)    Headache    occasional    Hypertension    was told, but never on meds   Infection    UTI   Ovarian cyst    Urinary tract infection    Past Surgical History:  Procedure Laterality Date   CLEFT PALATE REPAIR      etopical surgery     LAPAROSCOPY FOR ECTOPIC PREGNANCY  2011   TYMPANOPLASTY Right 08/03/2015   Procedure: RIGHT MEDIAL TECHNIQUE TYMPANOPLASTY;  Surgeon: Flo Shanks, MD;  Location: [facility];  Service: ENT;  Laterality: Right;   VENTRAL HERNIA REPAIR N/A 03/26/2015   Procedure: LAPAROSCOPIC REPAIR OF INCARCERATED SUPERUMBILICAL HERNIA X2 AND UMBILICAL HERNIA REPAIR WITH MESH;  Surgeon: Karie Soda, MD;  Location: [facility];  Service: General;  Laterality: N/A;     A IV Location/Drains/Wounds Patient Lines/Drains/Airways Status     Active Line/Drains/Airways     Name Placement date Placement time Site Days   Peripheral IV 07/17/22 20 G 1.16" Anterior;Left Forearm 07/17/22  0406  Forearm  less than 1            Intake/Output Last 24 hours No intake or output data in the 24 hours ending 07/17/22 1657  Labs/Imaging Results for orders placed or performed during the hospital encounter of 07/17/22 (from the past 48 hour(s))  Lipase, blood     Status: None   Collection Time: 07/17/22  3:05 AM  Result Value Ref Range   Lipase 23 11 - 51 U/L    Comment: Performed at [facility], [address] [place], [place] [address]  Comprehensive metabolic panel     Status: Abnormal   Collection Time: 07/17/22  3:05 AM  Result Value Ref Range   Sodium 142 135 - 145 mmol/L   Potassium 3.7 3.5 - 5.1 mmol/L  Chloride 105 98 - 111 mmol/L   CO2 24 22 - 32 mmol/L   Glucose, Bld 187 (H) 70 - 99 mg/dL    Comment: Glucose reference range applies only to samples taken after fasting for at least 8 hours.   BUN 5 (L) 6 - 20 mg/dL   Creatinine, Ser 5.88 0.44 - 1.00 mg/dL   Calcium 32.5 8.9 - 49.8 mg/dL   Total Protein 8.0 6.5 - 8.1 g/dL   Albumin 4.3 3.5 - 5.0 g/dL   AST 16 15 - 41 U/L   ALT 18 0 - 44 U/L   Alkaline Phosphatase 74 38 - 126 U/L   Total Bilirubin 0.6 0.3 - 1.2 mg/dL   GFR, Estimated >26 >41 mL/min    Comment: (NOTE) Calculated using the CKD-EPI Creatinine  Equation (2021)    Anion gap 13 5 - 15    Comment: Performed at [facility], [address], [place], [place] [address]  CBC     Status: Abnormal   Collection Time: 07/17/22  3:05 AM  Result Value Ref Range   WBC 9.3 4.0 - 10.5 K/uL   RBC 4.99 3.87 - 5.11 MIL/uL   Hemoglobin 14.9 12.0 - 15.0 g/dL   HCT 40.7 68.0 - 88.1 %   MCV 86.0 80.0 - 100.0 fL   MCH 29.9 26.0 - 34.0 pg   MCHC 34.7 30.0 - 36.0 g/dL   RDW 10.3 15.9 - 45.8 %   Platelets 458 (H) 150 - 400 K/uL   nRBC 0.0 0.0 - 0.2 %    Comment: Performed at [facility], [address], [place], [place] [address]  I-Stat beta hCG blood, ED     Status: None   Collection Time: 07/17/22  3:10 AM  Result Value Ref Range   I-stat hCG, quantitative <5.0 <5 mIU/mL   Comment 3            Comment:   GEST. AGE      CONC.  (mIU/mL)   <=1 WEEK        5 - 50     2 WEEKS       50 - 500     3 WEEKS       100 - 10,000     4 WEEKS     1,000 - 30,000        FEMALE AND NON-PREGNANT FEMALE:     LESS THAN 5 mIU/mL   Ethanol     Status: Abnormal   Collection Time: 07/17/22  4:09 AM  Result Value Ref Range   Alcohol, Ethyl (B) 35 (H) <10 mg/dL    Comment: (NOTE) Lowest detectable limit for serum alcohol is 10 mg/dL.  For medical purposes only. Performed at [facility], [address] [place], [place] [address]    CT ABDOMEN PELVIS W CONTRAST  Result Date: 07/17/2022 CLINICAL DATA:  Abdominal pain, acute, nonlocalized EXAM: CT ABDOMEN AND PELVIS WITH CONTRAST TECHNIQUE: Multidetector CT imaging of the abdomen and pelvis was performed using the standard protocol following bolus administration of intravenous contrast. RADIATION DOSE REDUCTION: This exam was performed according to the departmental dose-optimization program which includes automated exposure control, adjustment of the mA and/or kV according to patient size and/or use of iterative reconstruction technique. CONTRAST:  16mL OMNIPAQUE IOHEXOL 300 MG/ML  SOLN  COMPARISON:  None Available. FINDINGS: Lower chest: No acute abnormality. Hepatobiliary: No focal liver abnormality is seen. No gallstones, gallbladder wall thickening, or biliary  dilatation. Pancreas: Unremarkable. No pancreatic ductal dilatation or surrounding inflammatory changes. Spleen: No splenic injury or perisplenic hematoma. Couple of soft tissue densities at the upper anterior aspect of the spleen with the larger density measuring 1.1 cm likely splenosis (image 23/3). Adrenals/Urinary Tract: Adrenal glands are unremarkable. Kidneys are normal, without renal calculi, focal lesion, or hydronephrosis. Bladder is unremarkable. Stomach/Bowel: Small hiatal hernia. Stomach is within normal limits. Appendix is normal. No evidence of bowel wall thickening, distention, or inflammatory changes. Vascular/Lymphatic: No significant vascular findings are present. No enlarged abdominal or pelvic lymph nodes. Reproductive: Uterus and bilateral adnexa are unremarkable. Other: No abdominal wall hernia or abnormality. No abdominopelvic ascites. Musculoskeletal: No acute or significant osseous findings. IMPRESSION: 1.  Small hiatal hernia. 2. No free air, fluid, inflammatory changes, mass or significant lymphadenopathy. Bowel-gas pattern is nonobstructive. Electronically Signed   By: Marjo Bicker M.D.   On: 07/17/2022 09:37    Pending Labs Unresulted Labs (From admission, onward)     Start     Ordered   07/18/22 0500  CBC  Tomorrow morning,   R        07/17/22 1143   07/18/22 0500  Basic metabolic panel  Tomorrow morning,   R        07/17/22 1143   07/17/22 1252  Hemoglobin A1c  Add-on,   AD        07/17/22 1251   07/17/22 1201  Phosphorus  Once,   R        07/17/22 1200   07/17/22 1143  Magnesium  Once,   R        07/17/22 1143   07/17/22 1143  TSH  Once,   R        07/17/22 1143   07/17/22 1142  HIV Antibody (routine testing w rflx)  (HIV Antibody (Routine testing w reflex) panel)  Once,   R         07/17/22 1143   07/17/22 0300  Urinalysis, Routine w reflex microscopic  Once,   URGENT        07/17/22 0259            Vitals/Pain Today's Vitals   07/17/22 1515 07/17/22 1530 07/17/22 1545 07/17/22 1636  BP: 113/85 104/81 114/80   Pulse: 81 71    Resp: 16 18 14    Temp:    98.6 F (37 C)  TempSrc:    Oral  SpO2: 100% 99%    Weight:      Height:      PainSc:        Isolation Precautions No active isolations  Medications Medications  diltiazem (CARDIZEM) 1 mg/mL load via infusion 10 mg (10 mg Intravenous Bolus from Bag 07/17/22 0934)    And  diltiazem (CARDIZEM) 125 mg in dextrose 5% 125 mL (1 mg/mL) infusion (5 mg/hr Intravenous New Bag/Given 07/17/22 0934)  acetaminophen (TYLENOL) tablet 650 mg (has no administration in time range)    Or  acetaminophen (TYLENOL) suppository 650 mg (has no administration in time range)  albuterol (PROVENTIL) (2.5 MG/3ML) 0.083% nebulizer solution 2.5 mg (has no administration in time range)  enoxaparin (LOVENOX) injection 57.5 mg (57.5 mg Subcutaneous Given 07/17/22 1338)  pantoprazole (PROTONIX) injection 40 mg (has no administration in time range)  mometasone-formoterol (DULERA) 200-5 MCG/ACT inhaler 2 puff (0 puffs Inhalation Hold 07/17/22 1337)  hydrOXYzine (ATARAX) tablet 25 mg (has no administration in time range)  QUEtiapine (SEROQUEL) tablet 400 mg (has no administration in time range)  montelukast (SINGULAIR) tablet 10 mg (has no administration in time range)  metoCLOPramide (REGLAN) injection 5 mg (5 mg Intravenous Given 07/17/22 1336)  prochlorperazine (COMPAZINE) injection 10 mg (has no administration in time range)  lactated ringers bolus 1,000 mL (0 mLs Intravenous Stopped 07/17/22 0507)  HYDROmorphone (DILAUDID) injection 1 mg (1 mg Intravenous Given 07/17/22 0406)  ondansetron (ZOFRAN) injection 4 mg (4 mg Intravenous Given 07/17/22 0406)  pantoprazole (PROTONIX) injection 40 mg (40 mg Intravenous Given 07/17/22 0407)  alum &  mag hydroxide-simeth (MAALOX/MYLANTA) 200-200-20 MG/5ML suspension 30 mL (30 mLs Oral Given 07/17/22 0406)    And  lidocaine (XYLOCAINE) 2 % viscous mouth solution 15 mL (15 mLs Oral Given 07/17/22 0406)  promethazine (PHENERGAN) 12.5 mg in sodium chloride 0.9 % 50 mL IVPB (0 mg Intravenous Stopped 07/17/22 0700)  lactated ringers bolus 1,000 mL (0 mLs Intravenous Stopped 07/17/22 0700)  diltiazem (CARDIZEM) injection 10 mg (10 mg Intravenous Given 07/17/22 0601)  sodium chloride 0.9 % bolus 500 mL (0 mLs Intravenous Stopped 07/17/22 1027)  HYDROmorphone (DILAUDID) injection 1 mg (1 mg Intravenous Given 07/17/22 1315)  metoCLOPramide (REGLAN) injection 10 mg (10 mg Intravenous Given 07/17/22 0933)  iohexol (OMNIPAQUE) 300 MG/ML solution 75 mL (75 mLs Intravenous Contrast Given 07/17/22 2130)    Mobility walks Low fall risk   Focused Assessments Cardiac Assessment Handoff:    Lab Results  Component Value Date   TROPONINI <0.30 01/14/2013   Lab Results  Component Value Date   DDIMER <0.27 08/12/2013   Does the Patient currently have chest pain? No    R Recommendations: See Admitting Provider Note  Report given to:   Additional Notes:

## 2022-07-17 NOTE — ED Provider Notes (Signed)
[facility] EMERGENCY DEPARTMENT Provider Note   CSN: [id number] Arrival date & time: 07/17/22  0242     History  Chief Complaint  Patient presents with   Abdominal Pain    [name] is a 41 y.o. female.  Drank some beer earlier today and started having epigastric pain and vomiting similar to etoh gastritis she has had before. No fever. No diarrhea or constipation.    Abdominal Pain      Home Medications Prior to Admission medications   Medication Sig Start Date End Date Taking? Authorizing Provider  estradiol (VIVELLE-DOT) 0.05 MG/24HR patch Place 1 patch onto the skin 2 (two) times a week. 05/27/21   [provider]  ibuprofen (ADVIL) 800 MG tablet Take 1 tablet (800 mg total) by mouth 3 (three) times daily. 04/24/20   Darr, Gerilyn Pilgrim, PA-C  ondansetron (ZOFRAN) 4 MG tablet Take 1 tablet (4 mg total) by mouth every 6 (six) hours. 07/10/21   Rolan Bucco, MD  pantoprazole (PROTONIX) 20 MG tablet Take 1 tablet (20 mg total) by mouth daily. 07/14/21   Caccavale, Sophia, PA-C  progesterone (PROMETRIUM) 100 MG capsule Take 100 mg by mouth at bedtime. 07/05/21   [provider]      Allergies    Flexeril [cyclobenzaprine]    Review of Systems   Review of Systems  Gastrointestinal:  Positive for abdominal pain.    Physical Exam Updated Vital Signs BP (!) 145/103   Pulse 82   Temp 98.5 F (36.9 C) (Oral)   Resp 16   SpO2 100%  Physical Exam Vitals and nursing note reviewed.  Constitutional:      Appearance: She is well-developed.  HENT:     Head: Normocephalic and atraumatic.  Cardiovascular:     Rate and Rhythm: Regular rhythm. Tachycardia present.  Pulmonary:     Effort: No respiratory distress.     Breath sounds: No stridor.  Abdominal:     General: There is no distension.     Tenderness: There is generalized abdominal tenderness.  Musculoskeletal:     Cervical back: Normal range of motion.  Skin:    General: Skin  is warm and dry.  Neurological:     Mental Status: She is alert.     ED Results / Procedures / Treatments   Labs (all labs ordered are listed, but only abnormal results are displayed) Labs Reviewed  CBC - Abnormal; Notable for the following components:      Result Value   Platelets 458 (*)    All other components within normal limits  LIPASE, BLOOD  COMPREHENSIVE METABOLIC PANEL  URINALYSIS, ROUTINE W REFLEX MICROSCOPIC  ETHANOL  I-STAT BETA HCG BLOOD, ED ([facility], [facility], [facility] ONLY)    EKG None  Radiology No results found.  Procedures .Critical Care  Performed by: Marily Memos, MD Authorized by: Marily Memos, MD   Critical care provider statement:    Critical care time (minutes):  30   Critical care was necessary to treat or prevent imminent or life-threatening deterioration of the following conditions:  Circulatory failure   Critical care was time spent personally by me on the following activities:  Development of treatment plan with patient or surrogate, discussions with consultants, evaluation of patient's response to treatment, examination of patient, ordering and review of laboratory studies, ordering and review of radiographic studies, ordering and performing treatments and interventions, pulse oximetry, re-evaluation of patient's condition and review of old charts     Medications Ordered in  ED Medications  lactated ringers bolus 1,000 mL (has no administration in time range)  HYDROmorphone (DILAUDID) injection 1 mg (has no administration in time range)  ondansetron (ZOFRAN) injection 4 mg (has no administration in time range)  pantoprazole (PROTONIX) injection 40 mg (has no administration in time range)  alum & mag hydroxide-simeth (MAALOX/MYLANTA) 200-200-20 MG/5ML suspension 30 mL (has no administration in time range)    And  lidocaine (XYLOCAINE) 2 % viscous mouth solution 15 mL (has no administration in time range)    ED Course/ Medical Decision Making/ A&P                            Medical Decision Making Amount and/or Complexity of Data Reviewed Labs: ordered. Radiology: ordered.  Risk OTC drugs. Prescription drug management. Decision regarding hospitalization.  Likely recurrent etoh gastritis. Will r/o pancreatitis. Symptomatic treatment, will hold on imaging unless her symptoms arent able to be controlled or abnormal labs.  Care transferred pending reevaluation for disposition.    Final Clinical Impression(s) / ED Diagnoses Final diagnoses:  None    Rx / DC Orders ED Discharge Orders     None         Kyeisha Janowicz, Barbara Cower, MD 07/18/22 [phone]

## 2022-07-17 NOTE — Consult Note (Signed)
Cardiology Consultation:   Patient ID: [name] MRN: [id number]; DOB: 24-Jan-1981  Admit date: 07/17/2022 Date of Consult: 07/17/2022  PCP:  Charlane Ferretti, DO   [facility] Providers Cardiologist:  None    Patient Profile:   [name] is a 41 y.o. female with a hx of anxiety, HTN, GERD, and remote episode of Afib in the past who is being seen 07/17/2022 for the evaluation of Afib with RVR at the request of Dr. Katrinka Blazing.  History of Present Illness:   [name] is a 41 year old female with remote history of Afib in the past not on medications who does not regularly follow with cardiology who presented to the ER with nausea/vomiting and abdominal pain found to be in Afib with RVR for which Cardiology was consulted.  Patient was seen by Cardiology in 2014 when she was initially diagnosed with Afib with RVR after presenting with nausea/vomiting from drinking the night before. During that admission, she was placed on IV dilt and ultimately converted to NSR. TTE at that time with LVEF 55-60%, mild AR. She was discharged home on PO dilt and ASA but she did not continue to take them. She was not seen by Cardiology in follow-up.  She presented on this admission with nausea/vomiting and abdominal pain. On admission here, she was in Afib with RVR with HR 128. Labs unremarkable. CT abdomen/pelvis with small hiatal hernia but no acute pathology. She was placed on dilt gtt with improvement. She was not able to tolerate PO meds and therefore lovenox was started for [facility].  Currently, the patient continues to have significant abdominal discomfort. Denies palpitations, chest pain, or SOB. States she is unaware of any recurrence of her Afib since her prior admission in 2014 and has followed regularly with a PCP.   Past Medical History:  Diagnosis Date   Anxiety    Back pain, chronic    Depression    hx of    Dysrhythmia 2013   hx of a-fib   Ectopic pregnancy    GERD  (gastroesophageal reflux disease)    Headache    occasional    Hypertension    was told, but never on meds   Infection    UTI   Ovarian cyst    Urinary tract infection     Past Surgical History:  Procedure Laterality Date   CLEFT PALATE REPAIR     etopical surgery     LAPAROSCOPY FOR ECTOPIC PREGNANCY  2011   TYMPANOPLASTY Right 08/03/2015   Procedure: RIGHT MEDIAL TECHNIQUE TYMPANOPLASTY;  Surgeon: Flo Shanks, MD;  Location: [facility];  Service: ENT;  Laterality: Right;   VENTRAL HERNIA REPAIR N/A 03/26/2015   Procedure: LAPAROSCOPIC REPAIR OF INCARCERATED SUPERUMBILICAL HERNIA X2 AND UMBILICAL HERNIA REPAIR WITH MESH;  Surgeon: Karie Soda, MD;  Location: [facility];  Service: General;  Laterality: N/A;     Home Medications:  Prior to Admission medications   Medication Sig Start Date End Date Taking? Authorizing Provider  amLODipine (NORVASC) 5 MG tablet Take 5 mg by mouth daily. 06/16/22  Yes [provider]  hydrOXYzine (ATARAX) 25 MG tablet Take 25 mg by mouth every 6 (six) hours as needed for anxiety. 06/23/22  Yes [provider]  montelukast (SINGULAIR) 10 MG tablet Take 10 mg by mouth daily. 06/27/22  Yes [provider]  QUEtiapine (SEROQUEL) 400 MG tablet Take 400 mg by mouth at bedtime. 06/23/22  Yes [provider]  SYMBICORT 160-4.5 MCG/ACT  inhaler Inhale 2 puffs into the lungs daily as needed (SOB). 03/10/22  Yes [provider]    Inpatient Medications: Scheduled Meds:  enoxaparin (LOVENOX) injection  1 mg/kg Subcutaneous Q12H   metoCLOPramide (REGLAN) injection  5 mg Intravenous Q8H   mometasone-formoterol  2 puff Inhalation BID   montelukast  10 mg Oral QHS   pantoprazole (PROTONIX) IV  40 mg Intravenous Q12H   QUEtiapine  400 mg Oral QHS   Continuous Infusions:  diltiazem (CARDIZEM) infusion 5 mg/hr (07/17/22 0934)   PRN Meds: acetaminophen **OR** acetaminophen, albuterol, hydrOXYzine,  prochlorperazine  Allergies:    Allergies  Allergen Reactions   Flexeril [Cyclobenzaprine] Hives   Lamotrigine Hives    Social History:   Social History   Socioeconomic History   Marital status: Single    Spouse name: Not on file   Number of children: Not on file   Years of education: Not on file   Highest education level: Not on file  Occupational History   Not on file  Tobacco Use   Smoking status: Every Day    Packs/day: 0.25    Years: 15.00    Total pack years: 3.75    Types: Cigarettes   Smokeless tobacco: Never  Vaping Use   Vaping Use: Never used  Substance and Sexual Activity   Alcohol use: Yes    Alcohol/week: 1.0 standard drink of alcohol    Types: 1 Standard drinks or equivalent per week    Comment: social   Drug use: No   Sexual activity: Yes    Partners: Male    Birth control/protection: None  Other Topics Concern   Not on file  Social History Narrative   Not on file   Social Determinants of Health   Financial Resource Strain: Not on file  Food Insecurity: Not on file  Transportation Needs: Not on file  Physical Activity: Not on file  Stress: Not on file  Social Connections: Not on file  Intimate Partner Violence: Not on file    Family History:    Family History  Problem Relation Age of Onset   Hypertension Mother    Hypertension Father    Cancer Sister    Heart disease Maternal Grandmother    Cancer Maternal Grandfather    Colon cancer Maternal Grandfather    Other Neg Hx    Hearing loss Neg Hx    Rectal cancer Neg Hx    Stomach cancer Neg Hx    Esophageal cancer Neg Hx      ROS:  Please see the history of present illness.   All other ROS reviewed and negative.     Physical Exam/Data:   Vitals:   07/17/22 1100 07/17/22 1200 07/17/22 1215 07/17/22 1218  BP: (!) 98/59 113/88 (!) 115/98   Pulse: 98  90   Resp: 11 19 (!) 23   Temp:      TempSrc:      SpO2: 99%  100%   Weight:    58.1 kg  Height:    5' (1.524 m)   No  intake or output data in the 24 hours ending 07/17/22 1415    07/17/2022   12:18 PM 09/10/2021    9:28 AM 07/14/2021    8:50 AM  Last 3 Weights  Weight (lbs) 128 lb 135 lb 6 oz 145 lb  Weight (kg) 58.06 kg 61.406 kg 65.772 kg     Body mass index is 25 kg/m.  General:  Uncomfortable appearing HEENT: normal  Neck: no JVD Vascular: No carotid bruits; Distal pulses 2+ bilaterally Cardiac:  Irregularly, irregular Lungs:  clear to auscultation bilaterally, no wheezing, rhonchi or rales  Abd: soft, mildly tender Ext: no edema Musculoskeletal:  No deformities, BUE and BLE strength normal and equal Skin: warm and dry  Neuro:  CNs 2-12 intact, no focal abnormalities noted Psych:  Normal affect   EKG:  The EKG was personally reviewed and demonstrates:  Afib with HR 128 Telemetry:  Telemetry was personally reviewed and demonstrates:  Afib with HR 100-120s  Relevant CV Studies: TTE 01/2013: Study Conclusions   - Left ventricle: The cavity size was normal. Systolic    function was normal. The estimated ejection fraction was    in the range of 55% to 60%. Wall motion was normal; there    were no regional wall motion abnormalities. Left    ventricular diastolic function parameters were normal.  - Aortic valve: Mild regurgitation.   Laboratory Data:  High Sensitivity Troponin:  No results for input(s): "TROPONINIHS" in the last 720 hours.   Chemistry Recent Labs  Lab 07/17/22 0305  NA 142  K 3.7  CL 105  CO2 24  GLUCOSE 187*  BUN 5*  CREATININE 0.58  CALCIUM 10.1  GFRNONAA >60  ANIONGAP 13    Recent Labs  Lab 07/17/22 0305  PROT 8.0  ALBUMIN 4.3  AST 16  ALT 18  ALKPHOS 74  BILITOT 0.6   Lipids No results for input(s): "CHOL", "TRIG", "HDL", "LABVLDL", "LDLCALC", "CHOLHDL" in the last 168 hours.  Hematology Recent Labs  Lab 07/17/22 0305  WBC 9.3  RBC 4.99  HGB 14.9  HCT 42.9  MCV 86.0  MCH 29.9  MCHC 34.7  RDW 13.3  PLT 458*   Thyroid No results for  input(s): "TSH", "FREET4" in the last 168 hours.  BNPNo results for input(s): "BNP", "PROBNP" in the last 168 hours.  DDimer No results for input(s): "DDIMER" in the last 168 hours.   Radiology/Studies:  CT ABDOMEN PELVIS W CONTRAST  Result Date: 07/17/2022 CLINICAL DATA:  Abdominal pain, acute, nonlocalized EXAM: CT ABDOMEN AND PELVIS WITH CONTRAST TECHNIQUE: Multidetector CT imaging of the abdomen and pelvis was performed using the standard protocol following bolus administration of intravenous contrast. RADIATION DOSE REDUCTION: This exam was performed according to the departmental dose-optimization program which includes automated exposure control, adjustment of the mA and/or kV according to patient size and/or use of iterative reconstruction technique. CONTRAST:  21mL OMNIPAQUE IOHEXOL 300 MG/ML  SOLN COMPARISON:  None Available. FINDINGS: Lower chest: No acute abnormality. Hepatobiliary: No focal liver abnormality is seen. No gallstones, gallbladder wall thickening, or biliary dilatation. Pancreas: Unremarkable. No pancreatic ductal dilatation or surrounding inflammatory changes. Spleen: No splenic injury or perisplenic hematoma. Couple of soft tissue densities at the upper anterior aspect of the spleen with the larger density measuring 1.1 cm likely splenosis (image 23/3). Adrenals/Urinary Tract: Adrenal glands are unremarkable. Kidneys are normal, without renal calculi, focal lesion, or hydronephrosis. Bladder is unremarkable. Stomach/Bowel: Small hiatal hernia. Stomach is within normal limits. Appendix is normal. No evidence of bowel wall thickening, distention, or inflammatory changes. Vascular/Lymphatic: No significant vascular findings are present. No enlarged abdominal or pelvic lymph nodes. Reproductive: Uterus and bilateral adnexa are unremarkable. Other: No abdominal wall hernia or abnormality. No abdominopelvic ascites. Musculoskeletal: No acute or significant osseous findings. IMPRESSION:  1.  Small hiatal hernia. 2. No free air, fluid, inflammatory changes, mass or significant lymphadenopathy. Bowel-gas pattern is nonobstructive. Electronically Signed  By: Marjo Bicker M.D.   On: 07/17/2022 09:37     Assessment and Plan:   #Paroxysmal Afib with RVR: CHADs-vasc 2 for HTN and female gender. Has remote history of Afib in the past in 2014 after binge drinking. TTE at that time with normal BiV function. Has not had known recurrence since that time until this admission but she has not followed regularly with Cardiology. Presented on this admission with significant abdominal discomfort which is likely the primary driver of her recurrence of Afib. She is relatively asymptomatic from an Afib standpoint with no chest pain, SOB, palpitations or lightheadedness. Will continue IV dilt for now given she cannot tolerate PO. Ultimately plan to transition to PO nodal agents and apixaban. -Continue dilt gtt as cannot tolerate PO -Continue lovenox for [facility] for now -Check TTE -Plan to change to apixaban for [facility] once able to tolerate PO -May consider repeat monitor as outpatient to assess Afib burden and need for long-term [facility] -If fails to return to NSR despite improvement from abdominal pain standpoint, could plan for DCCV after 3 weeks of AC vs TEE/DCCV as inpatient   #Abdominal Pain: CT abdomen pelvis with no acute pathology. Thought to be due to possible gastritis. -Management per primary  #HTN: On amlodipine as outpatient -On dilt gtt for now    Risk Assessment/Risk Scores:          CHA2DS2-VASc Score = 2  This indicates a 2.2% annual risk of stroke. The patient's score is based upon: CHF History: 0 HTN History: 1 Diabetes History: 0 Stroke History: 0 Vascular Disease History: 0 Age Score: 0 Gender Score: 1          For questions or updates, please contact [facility] Please consult [email or web address] for contact info under    Signed, Meriam Sprague, MD  07/17/2022  2:15 PM

## 2022-07-17 NOTE — Progress Notes (Signed)
ANTICOAGULATION CONSULT NOTE - Initial Consult  Pharmacy Consult for Enoxaparin Indication: atrial fibrillation  Allergies  Allergen Reactions   Flexeril [Cyclobenzaprine] Hives    Patient Measurements:   Pt wt 58.1 kg  Vital Signs: Temp: 98.7 F (37.1 C) (08/13 1027) Temp Source: Oral (08/13 1027) BP: 98/59 (08/13 1100) Pulse Rate: 98 (08/13 1100)  Labs: Recent Labs    07/17/22 0305  HGB 14.9  HCT 42.9  PLT 458*  CREATININE 0.58    CrCl cannot be calculated (Unknown ideal weight.).   Medical History: Past Medical History:  Diagnosis Date   Anxiety    Back pain, chronic    Depression    hx of    Dysrhythmia 2013   hx of a-fib   Ectopic pregnancy    GERD (gastroesophageal reflux disease)    Headache    occasional    Hypertension    was told, but never on meds   Infection    UTI   Ovarian cyst    Urinary tract infection     Medications:  (Not in a hospital admission)  Scheduled:  Infusions:   diltiazem (CARDIZEM) infusion 5 mg/hr (07/17/22 0934)    Assessment: 41 yof with a history of paroxysmal atrial fibrillation, anxiety, depression, HTN. Patient is presenting with abdominal pain. Enoxaparin per pharmacy consult placed for atrial fibrillation.  Patient is not on anticoagulation prior to arrival.  Hgb 14.9; plt 458  Goal of Therapy:  Monitor platelets by anticoagulation protocol: Yes   Plan:  Enoxaparin 1mg /kg q12h F/u transition to PO anticoagulation Monitor for s/s of bleeding , CBC  , PharmD, BCPS 07/17/2022 12:19 PM ED Clinical Pharmacist -  [phone]

## 2022-07-17 NOTE — ED Provider Notes (Signed)
Blood pressure (!) 98/59, pulse 98, temperature 98.7 F (37.1 C), temperature source Oral, resp. rate 11, SpO2 99 %.  Assuming care from Dr. Clayborne Dana.  In short, [name] is a 41 y.o. female with a chief complaint of Abdominal Pain .  Refer to the original H&P for additional details.  The current plan of care is to follow up after medication.  11:30 AM  Patient continues to have vomiting in the ED not controlled by multiple IV medications.  On my repeat assessment she is in A-fib RVR with a rate in the 130 range.  She is not anticoagulated and thus is not a candidate for cardioversion.  CT imaging, which I independently interpreted and agree with radiology, is largely unremarkable.  Suspect gastritis clinically but patient was placed on a diltiazem infusion which is controlling her heart rate better.  Do not feel I can transition her to oral medication with her nausea/vomiting at this time and reached out to the hospitalist for consultation and admission.   CRITICAL CARE Performed by: Maia Plan Total critical care time: 35 minutes Critical care time was exclusive of separately billable procedures and treating other patients. Critical care was necessary to treat or prevent imminent or life-threatening deterioration. Critical care was time spent personally by me on the following activities: development of treatment plan with patient and/or surrogate as well as nursing, discussions with consultants, evaluation of patient's response to treatment, examination of patient, obtaining history from patient or surrogate, ordering and performing treatments and interventions, ordering and review of laboratory studies, ordering and review of radiographic studies, pulse oximetry and re-evaluation of patient's condition.  Alona Bene, MD Emergency Medicine     Arty Lantzy, Arlyss Repress, MD 07/17/22 1140

## 2022-07-18 ENCOUNTER — Other Ambulatory Visit (HOSPITAL_COMMUNITY): Payer: Self-pay

## 2022-07-18 ENCOUNTER — Observation Stay (HOSPITAL_COMMUNITY): Payer: No Typology Code available for payment source

## 2022-07-18 DIAGNOSIS — I4891 Unspecified atrial fibrillation: Secondary | ICD-10-CM

## 2022-07-18 DIAGNOSIS — I10 Essential (primary) hypertension: Secondary | ICD-10-CM | POA: Diagnosis not present

## 2022-07-18 DIAGNOSIS — R112 Nausea with vomiting, unspecified: Secondary | ICD-10-CM | POA: Diagnosis not present

## 2022-07-18 DIAGNOSIS — I480 Paroxysmal atrial fibrillation: Secondary | ICD-10-CM | POA: Diagnosis not present

## 2022-07-18 LAB — HEMOGLOBIN A1C
Hgb A1c MFr Bld: 5.7 % — ABNORMAL HIGH (ref 4.8–5.6)
Mean Plasma Glucose: 116.89 mg/dL

## 2022-07-18 LAB — CBC
HCT: 40.6 % (ref 36.0–46.0)
Hemoglobin: 14.2 g/dL (ref 12.0–15.0)
MCH: 29.8 pg (ref 26.0–34.0)
MCHC: 35 g/dL (ref 30.0–36.0)
MCV: 85.3 fL (ref 80.0–100.0)
Platelets: 390 10*3/uL (ref 150–400)
RBC: 4.76 MIL/uL (ref 3.87–5.11)
RDW: 13.4 % (ref 11.5–15.5)
WBC: 9 10*3/uL (ref 4.0–10.5)
nRBC: 0 % (ref 0.0–0.2)

## 2022-07-18 LAB — ECHOCARDIOGRAM COMPLETE
AR max vel: 1.73 cm2
AV Area VTI: 1.96 cm2
AV Area mean vel: 1.92 cm2
AV Mean grad: 6 mmHg
AV Peak grad: 11.8 mmHg
Ao pk vel: 1.72 m/s
Area-P 1/2: 2.53 cm2
Height: 60 in
P 1/2 time: 367 msec
S' Lateral: 2.2 cm
Weight: 2048 oz

## 2022-07-18 LAB — URINALYSIS, ROUTINE W REFLEX MICROSCOPIC
Bilirubin Urine: NEGATIVE
Glucose, UA: NEGATIVE mg/dL
Hgb urine dipstick: NEGATIVE
Ketones, ur: NEGATIVE mg/dL
Leukocytes,Ua: NEGATIVE
Nitrite: NEGATIVE
Protein, ur: NEGATIVE mg/dL
Specific Gravity, Urine: 1.012 (ref 1.005–1.030)
pH: 6 (ref 5.0–8.0)

## 2022-07-18 LAB — RENAL FUNCTION PANEL
Albumin: 3.8 g/dL (ref 3.5–5.0)
Anion gap: 9 (ref 5–15)
BUN: 5 mg/dL — ABNORMAL LOW (ref 6–20)
CO2: 23 mmol/L (ref 22–32)
Calcium: 9.4 mg/dL (ref 8.9–10.3)
Chloride: 105 mmol/L (ref 98–111)
Creatinine, Ser: 0.61 mg/dL (ref 0.44–1.00)
GFR, Estimated: >60 mL/min (ref >60)
Glucose, Bld: 118 mg/dL — ABNORMAL HIGH (ref 70–99)
Phosphorus: 4.6 mg/dL (ref 2.5–4.6)
Potassium: 3.3 mmol/L — ABNORMAL LOW (ref 3.5–5.1)
Sodium: 137 mmol/L (ref 135–145)

## 2022-07-18 LAB — BASIC METABOLIC PANEL
Anion gap: 8 (ref 5–15)
BUN: <5 mg/dL — ABNORMAL LOW (ref 6–20)
CO2: 25 mmol/L (ref 22–32)
Calcium: 9.4 mg/dL (ref 8.9–10.3)
Chloride: 103 mmol/L (ref 98–111)
Creatinine, Ser: 0.6 mg/dL (ref 0.44–1.00)
GFR, Estimated: >60 mL/min (ref >60)
Glucose, Bld: 97 mg/dL (ref 70–99)
Potassium: 2.8 mmol/L — ABNORMAL LOW (ref 3.5–5.1)
Sodium: 136 mmol/L (ref 135–145)

## 2022-07-18 LAB — RAPID URINE DRUG SCREEN, HOSP PERFORMED
Amphetamines: NOT DETECTED
Barbiturates: NOT DETECTED
Benzodiazepines: NOT DETECTED
Cocaine: NOT DETECTED
Opiates: POSITIVE — AB
Tetrahydrocannabinol: POSITIVE — AB

## 2022-07-18 LAB — LIPASE, BLOOD: Lipase: 26 U/L (ref 11–51)

## 2022-07-18 MED ORDER — APIXABAN 5 MG PO TABS
5.0000 mg | ORAL_TABLET | Freq: Two times a day (BID) | ORAL | Status: DC
  Administered 2022-07-18 – 2022-07-19 (×3): 5 mg via ORAL
  Filled 2022-07-18 (×3): qty 1

## 2022-07-18 MED ORDER — DILTIAZEM HCL ER COATED BEADS 180 MG PO CP24
180.0000 mg | ORAL_CAPSULE | Freq: Every day | ORAL | Status: DC
  Administered 2022-07-18 – 2022-07-19 (×2): 180 mg via ORAL
  Filled 2022-07-18 (×2): qty 1

## 2022-07-18 MED ORDER — SUCRALFATE 1 GM/10ML PO SUSP
1.0000 g | Freq: Three times a day (TID) | ORAL | Status: AC
  Administered 2022-07-18 – 2022-07-19 (×3): 1 g via ORAL
  Filled 2022-07-18 (×3): qty 10

## 2022-07-18 MED ORDER — POTASSIUM CHLORIDE CRYS ER 20 MEQ PO TBCR
40.0000 meq | EXTENDED_RELEASE_TABLET | ORAL | Status: AC
  Administered 2022-07-18 (×3): 40 meq via ORAL
  Filled 2022-07-18 (×3): qty 2

## 2022-07-18 MED ORDER — HYDROMORPHONE HCL 1 MG/ML IJ SOLN
0.5000 mg | Freq: Four times a day (QID) | INTRAMUSCULAR | Status: DC | PRN
  Administered 2022-07-18 – 2022-07-19 (×3): 0.5 mg via INTRAVENOUS
  Filled 2022-07-18 (×3): qty 0.5

## 2022-07-18 MED ORDER — PANTOPRAZOLE SODIUM 40 MG PO TBEC
40.0000 mg | DELAYED_RELEASE_TABLET | Freq: Every day | ORAL | Status: DC
  Administered 2022-07-19: 40 mg via ORAL
  Filled 2022-07-18 (×2): qty 1

## 2022-07-18 MED ORDER — APIXABAN 5 MG PO TABS: 5.0000 mg | ORAL_TABLET | Freq: Two times a day (BID) | ORAL | 0 refills | Status: DC

## 2022-07-18 MED ORDER — PANTOPRAZOLE SODIUM 40 MG PO TBEC: 40.0000 mg | DELAYED_RELEASE_TABLET | Freq: Every day | ORAL | 0 refills | Status: AC

## 2022-07-18 MED ORDER — DILTIAZEM HCL ER COATED BEADS 180 MG PO CP24: 180.0000 mg | ORAL_CAPSULE | Freq: Every day | ORAL | 0 refills | Status: DC

## 2022-07-18 NOTE — Discharge Instructions (Signed)

## 2022-07-18 NOTE — Progress Notes (Signed)
Echocardiogram 2D Echocardiogram has been performed.  Warren Lacy Sondra Blixt RDCS 07/18/2022, 9:26 AM

## 2022-07-18 NOTE — Progress Notes (Signed)
[facility] night cross cover note:  Patient c/o crampy abd pain.  Previously had order for as needed IV Dilaudid x2 doses.  However, after receiving both associated doses, does not currently have pain meds ordered.  Protonix 40 mg IV twice daily noted.  I subsequently placed order for additional prn IV Dilaudid.    Newton Pigg, DO Hospitalist

## 2022-07-18 NOTE — Progress Notes (Signed)
Progress Note  Patient Name: [name] Date of Encounter: 07/18/2022  [facility] Cardiologist: new Shari Prows)  Subjective   Vomiting has resolved.  Still slightly queasy. Has been in normal sinus rhythm since 1700 hrs. yesterday. As in 2014, the episode of paroxysmal atrial fibrillation was associate with alcohol consumption (she only had 3 drinks).  Inpatient Medications    Scheduled Meds:  enoxaparin (LOVENOX) injection  1 mg/kg Subcutaneous Q12H   metoCLOPramide (REGLAN) injection  5 mg Intravenous Q8H   mometasone-formoterol  2 puff Inhalation BID   montelukast  10 mg Oral QHS   pantoprazole (PROTONIX) IV  40 mg Intravenous Q12H   QUEtiapine  400 mg Oral QHS   Continuous Infusions:  diltiazem (CARDIZEM) infusion 5 mg/hr (07/18/22 6712)   PRN Meds: acetaminophen **OR** acetaminophen, albuterol, alum & mag hydroxide-simeth, HYDROmorphone (DILAUDID) injection, hydrOXYzine, naLOXone (NARCAN)  injection, prochlorperazine   Vital Signs    Vitals:   07/18/22 0405 07/18/22 0406 07/18/22 0733 07/18/22 0753  BP: 116/81   111/83  Pulse: 91  92 92  Resp: 19 18 18 15   Temp: 98.9 F (37.2 C)   (!) 97.5 F (36.4 C)  TempSrc: Oral   Oral  SpO2: 93%   93%  Weight:      Height:       No intake or output data in the 24 hours ending 07/18/22 1040    07/17/2022   12:18 PM 09/10/2021    9:28 AM 07/14/2021    8:50 AM  Last 3 Weights  Weight (lbs) 128 lb 135 lb 6 oz 145 lb  Weight (kg) 58.06 kg 61.406 kg 65.772 kg      Telemetry    Normal sinus rhythm- Personally Reviewed  ECG    Atrial fibrillation rapid ventricular response, RSR prime pattern in lead V1- Personally Reviewed  Physical Exam  Appears well. GEN: No acute distress.   Neck: No JVD Cardiac: RRR, no murmurs, rubs, or gallops.  Respiratory: Clear to auscultation bilaterally. GI: Soft, nontender, non-distended  MS: No edema; No deformity. Neuro:  Nonfocal  Psych: Normal affect   Labs     High Sensitivity Troponin:  No results for input(s): "TROPONINIHS" in the last 720 hours.   Chemistry Recent Labs  Lab 07/17/22 0305 07/17/22 1646 07/18/22 0145  NA 142  --  136  K 3.7  --  2.8*  CL 105  --  103  CO2 24  --  25  GLUCOSE 187*  --  97  BUN 5*  --  <5*  CREATININE 0.58  --  0.60  CALCIUM 10.1  --  9.4  MG  --  1.8  --   PROT 8.0  --   --   ALBUMIN 4.3  --   --   AST 16  --   --   ALT 18  --   --   ALKPHOS 74  --   --   BILITOT 0.6  --   --   GFRNONAA >60  --  >60  ANIONGAP 13  --  8    Lipids No results for input(s): "CHOL", "TRIG", "HDL", "LABVLDL", "LDLCALC", "CHOLHDL" in the last 168 hours.  Hematology Recent Labs  Lab 07/17/22 0305 07/18/22 0145  WBC 9.3 9.0  RBC 4.99 4.76  HGB 14.9 14.2  HCT 42.9 40.6  MCV 86.0 85.3  MCH 29.9 29.8  MCHC 34.7 35.0  RDW 13.3 13.4  PLT 458* 390   Thyroid  Recent Labs  Lab 07/17/22 1646  TSH 2.665    BNPNo results for input(s): "BNP", "PROBNP" in the last 168 hours.  DDimer No results for input(s): "DDIMER" in the last 168 hours.   Radiology    CT ABDOMEN PELVIS W CONTRAST  Result Date: 07/17/2022 CLINICAL DATA:  Abdominal pain, acute, nonlocalized EXAM: CT ABDOMEN AND PELVIS WITH CONTRAST TECHNIQUE: Multidetector CT imaging of the abdomen and pelvis was performed using the standard protocol following bolus administration of intravenous contrast. RADIATION DOSE REDUCTION: This exam was performed according to the departmental dose-optimization program which includes automated exposure control, adjustment of the mA and/or kV according to patient size and/or use of iterative reconstruction technique. CONTRAST:  41mL OMNIPAQUE IOHEXOL 300 MG/ML  SOLN COMPARISON:  None Available. FINDINGS: Lower chest: No acute abnormality. Hepatobiliary: No focal liver abnormality is seen. No gallstones, gallbladder wall thickening, or biliary dilatation. Pancreas: Unremarkable. No pancreatic ductal dilatation or surrounding  inflammatory changes. Spleen: No splenic injury or perisplenic hematoma. Couple of soft tissue densities at the upper anterior aspect of the spleen with the larger density measuring 1.1 cm likely splenosis (image 23/3). Adrenals/Urinary Tract: Adrenal glands are unremarkable. Kidneys are normal, without renal calculi, focal lesion, or hydronephrosis. Bladder is unremarkable. Stomach/Bowel: Small hiatal hernia. Stomach is within normal limits. Appendix is normal. No evidence of bowel wall thickening, distention, or inflammatory changes. Vascular/Lymphatic: No significant vascular findings are present. No enlarged abdominal or pelvic lymph nodes. Reproductive: Uterus and bilateral adnexa are unremarkable. Other: No abdominal wall hernia or abnormality. No abdominopelvic ascites. Musculoskeletal: No acute or significant osseous findings. IMPRESSION: 1.  Small hiatal hernia. 2. No free air, fluid, inflammatory changes, mass or significant lymphadenopathy. Bowel-gas pattern is nonobstructive. Electronically Signed   By: Marjo Bicker M.D.   On: 07/17/2022 09:37    Cardiac Studies   Reviewed echocardiogram that was just performed.  Normal left ventricular systolic function.  The left atrium is not dilated.  Trileaflet aortic valve with mild aortic insufficiency (unchanged from 2014).  No other valvular abnormalities.  No pericardial effusion  Patient Profile     41 y.o. female with infrequent occurrences of paroxysmal atrial fibrillation that have been associated with GI upset after alcohol consumption.  Assessment & Plan    Stop intravenous diltiazem.  I think oral diltiazem would be a good substitute for her amlodipine and would provide rate control for possible breakthrough atrial fibrillation in the future. CHA2DS2-VASc score 2 (gender, HTN).  Recommend oral anticoagulation lifelong.  Discussed potential bleeding complications, when bleeding requires additional medical evaluation, interruptions in  anticoagulants for surgical procedure/bleeding complications, etc.  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  Agrees to go ahead with an oral anticoagulant instead of aspirin. [facility] will sign off.   Medication Recommendations: Eliquis 5 mg twice daily, diltiazem sustained-release 180 mg daily.  Stop aspirin.  Stop amlodipine Other recommendations (labs, testing, etc):  n/a.  Ask for Eliquis discount card from case manager. Follow up as an outpatient: We will make arrangements to follow-up with Dr. Shari Prows.  For questions or updates, please contact [facility] Please consult [email or web address] for contact info under        Signed, Thurmon Fair, MD  07/18/2022, 10:40 AM

## 2022-07-18 NOTE — Progress Notes (Signed)
Progress Note   Patient: [name] DOB: 12/30/1980 DOA: 07/17/2022     0 DOS: the patient was seen and examined on 07/18/2022   Brief hospital course: 41 y.o. female with medical history significant of paroxysmal atrial fibrillation not on anticoagulation, anxiety, and depression presents with complaints of abdominal pain with nausea and vomiting.  She had been out drinking socially at [employer] yesterday evening.  She reported having 2 [place] ice teas and a blue motorcycle prior to developing "twisting" epigastric abdominal pain that she rated as a 10 out of 10 on pain scale.  Thereafter she developed nausea and vomiting.  Emesis was reported to be nonbloody in appearance.  Patient only drinks socially and estimates drinking 3-4 drinks per month on average.  She denies drinking alcohol on a daily basis.  She previously had a history of atrial fibrillation for which was reported back in her early 59s.  She had been prescribed Cardizem at that time, but reports only taking it for 1 month possibly has she lacked insurance at that time and medication was very expensive.  To her knowledge she had not had any reoccurrence of this until today.  8/14- cardiology recommends Eliquis, dilt SR 180 daily. Will trial oral agents and see if hemodynamically stable and plan for dc within 24H with close cardiology follow up  Assessment and Plan: Nausea and vomiting epigastric abdominal pain Improving. Patient presents after having new onset of nausea and vomiting after drinking alcohol.  Labs noted lipase within normal limits.  CT scan noted a small hiatal hernia, but no other acute abnormality. Question possibility of a gastritis as a cause of symptoms.  Patient had been given IV fluids, Protonix IV, and multiple antiemetics without significant improvement in symptoms. -Clear liquid diet and advance diet as tolerated, goal heart healthy diet -Reglan 5 mg IV every 8  hours -Dilaudid IV for pain, will attempt to wean off -Antiemetics as needed   Paroxysmal atrial fibrillation Patient presented in atrial fibrillation with heart rates elevated up to 128.  Prior history of being in atrial fibrillation approximately 20 years ago just temporarily on Cardizem, but discontinued due to cost.  She had not been on anticoagulation.  Patient has been given an bolus of 10 mg of Cardizem IV, and then placed on a drip.  Patient had been ordered Eliquis, but was unable to keep it down. CHA2DS2-VASc score = 2( sex and HTN) Transitioned off cardizem gtt-->Dilt SR 180 daily -TTE pending - Eliquis  Cardiology f/u   Essential hypertension Blood pressures currently maintained.  Home medication include amlodipine 5 mg daily. -Held amlodipine at this time due to nausea and vomiting   Hyperglycemia  Acute.  Glucose 189 on admission.  No prior history of diabetes. -Add on hemoglobin A1c   Bipolar disorder/anxiety and depression  -Continue Seroquel nightly once able   Hiatal hernia GERD Incidentally noted to have small hiatal hernia on CT scan.  Patient had not been on any antacid medication at home -Continue Protonix IV for now   Advance Care Planning:   Code Status: Full Code    Consults: Cardiology   Family Communication: Declined need to update family when asked   Severity of Illness: The appropriate patient status for this patient is OBSERVATION. Observation status is judged to be reasonable and necessary in order to provide the required intensity of service to ensure the patient's safety. The patient's presenting symptoms, physical exam findings, and initial radiographic and laboratory  data in the context of their medical condition is felt to place them at decreased risk for further clinical deterioration. Furthermore, it is anticipated that the patient will be medically stable for discharge from the hospital within 2 midnights of admission.         Subjective:    NAEON Somewhat cardiac aware Denies CP but does report epigastric pain after "[employer]" the night prior  Physical Exam: Vitals:   07/18/22 0406 07/18/22 0733 07/18/22 0753 07/18/22 1156  BP:   111/83 117/87  Pulse:  92 92 85  Resp: 18 18 15 16   Temp:   (!) 97.5 F (36.4 C)   TempSrc:   Oral   SpO2:   93%   Weight:      Height:        Exam   Constitutional: Middle-aged female who appears to be in no acute distress Eyes: PERRL, lids and conjunctivae normal ENMT: Mucous membranes are moist. Posterior pharynx clear of any exudate or lesions  Neck: normal, supple, no masses, no thyromegaly Respiratory: clear to auscultation bilaterally, no wheezing, no crackles. Normal respiratory effort. No accessory muscle use.  Cardiovascular: Regular rate and rhythm, no murmurs / rubs / gallops. No extremity edema. 2+ pedal pulses. No carotid bruits.  Abdomen: no tenderness, no masses palpated. No hepatosplenomegaly. Bowel sounds positive.  Musculoskeletal: no clubbing / cyanosis. No joint deformity upper and lower extremities. Good ROM, no contractures. Normal muscle tone.  Skin: no rashes, lesions, ulcers. No induration Neurologic: CN 2-12 grossly intact. Sensation intact, DTR normal. Strength 5/5 in all 4.  Psychiatric: Normal judgment and insight. Alert and oriented x 3. Normal mood.  Data Reviewed:  There are no new results to review at this time.  Family Communication: none available  Disposition: Status is: Observation The patient remains OBS appropriate and will d/c before 2 midnights.  Planned Discharge Destination: Home    Time spent: 25 minutes  Author: , MD 07/18/2022 12:05 PM  For on call review [email or web address].

## 2022-07-19 DIAGNOSIS — R112 Nausea with vomiting, unspecified: Secondary | ICD-10-CM | POA: Diagnosis not present

## 2022-07-19 DIAGNOSIS — I480 Paroxysmal atrial fibrillation: Secondary | ICD-10-CM | POA: Diagnosis not present

## 2022-07-19 LAB — BASIC METABOLIC PANEL
Anion gap: 8 (ref 5–15)
BUN: <5 mg/dL — ABNORMAL LOW (ref 6–20)
CO2: 24 mmol/L (ref 22–32)
Calcium: 9.8 mg/dL (ref 8.9–10.3)
Chloride: 109 mmol/L (ref 98–111)
Creatinine, Ser: 0.65 mg/dL (ref 0.44–1.00)
GFR, Estimated: >60 mL/min (ref >60)
Glucose, Bld: 123 mg/dL — ABNORMAL HIGH (ref 70–99)
Potassium: 4.3 mmol/L (ref 3.5–5.1)
Sodium: 141 mmol/L (ref 135–145)

## 2022-07-19 MED ORDER — SUCRALFATE 1 GM/10ML PO SUSP: 1.0000 g | Freq: Three times a day (TID) | ORAL | 0 refills | Status: DC

## 2022-07-19 NOTE — Plan of Care (Signed)
  Problem: Clinical Measurements: Goal: Respiratory complications will improve Outcome: Progressing Goal: Cardiovascular complication will be avoided Outcome: Progressing   Problem: Activity: Goal: Risk for activity intolerance will decrease Outcome: Progressing   Problem: Nutrition: Goal: Adequate nutrition will be maintained Outcome: Progressing   Problem: Coping: Goal: Level of anxiety will decrease Outcome: Progressing   Problem: Pain Managment: Goal: General experience of comfort will improve Outcome: Not Progressing   

## 2022-07-19 NOTE — Discharge Summary (Addendum)
Physician Discharge Summary   Patient: [name] MRN: [id number] DOB: 10-12-1981  Admit date:     07/17/2022  Discharge date: 07/19/22  Discharge Physician: Thomas Hoff   PCP: Charlane Ferretti, DO     Discharge Diagnoses: Principal Problem:   Nausea and vomiting Active Problems:   Epigastric abdominal pain   PAF (paroxysmal atrial fibrillation) (HCC)   Essential hypertension   Hyperglycemia   Bipolar disorder, unspecified (HCC)   Hiatal hernia   A-fib (HCC)  Resolved Problems:   * No resolved hospital problems. *  Hospital Course:  41 y.o. female with medical history significant of paroxysmal atrial fibrillation not on anticoagulation, anxiety, and depression presents with complaints of abdominal pain with nausea and vomiting.  She had been out drinking socially at [employer] yesterday evening.  She reported having 2 [place] [employer]as and a blue motorcycle prior to developing "twisting" epigastric abdominal pain that she rated as a 10 out of 10 on pain scale.  Thereafter she developed nausea and vomiting.  Emesis was reported to be nonbloody in appearance.  Patient only drinks socially and estimates drinking 3-4 drinks per month on average.  She denies drinking alcohol on a daily basis.  She previously had a history of atrial fibrillation for which was reported back in her early 61s.  She had been prescribed Cardizem at that time, but reports only taking it for 1 month possibly has she lacked insurance at that time and medication was very expensive.  To her knowledge she had not had any reoccurrence of this until today.   8/14- cardiology recommends Eliquis, dilt SR 180 daily. Will trial oral agents and see if hemodynamically stable and plan for dc within 24H with close cardiology follow up Maintained NSR with medication adjustment. Has appointment with Dr. Shari Prows in early October which she was reminded to keep  Assessment and Plan:  Nausea and vomiting epigastric  abdominal pain Improved, tolerating PO intake well without n/V. Discussed need to abstain from alcohol. Recommend PCP follow up in 1-2 weeks, which patient stated she will make. CT scan noted a small hiatal hernia, but no other acute abnormality. Question possibility of a gastritis as a cause of symptoms.    Paroxysmal atrial fibrillation Patient presented in atrial fibrillation with heart rates elevated up to 128.  Prior history of being in atrial fibrillation approximately 20 years ago just temporarily on Cardizem, but discontinued due to cost.  She had not been on anticoagulation.  Patient has been given an bolus of 10 mg of Cardizem IV, and then placed on a drip.  Patient had been ordered Eliquis, but was unable to keep it down. CHA2DS2-VASc score = 2( sex and HTN) Transitioned off cardizem gtt-->Dilt SR 180 daily -TTE  Normal left ventricular systolic function.  The left atrium is not dilated.  Trileaflet aortic valve with mild aortic insufficiency (unchanged from 2014).  No other valvular abnormalities.  No pericardial effusion - Eliquis started Cardiology f/u  sch in October  Essential hypertension Blood pressures currently maintained.  Home medication include amlodipine 5 mg daily. -Held amlodipine at this time due to nausea and vomiting   Hyperglycemia  Acute.  Glucose 189 on admission.  No prior history of diabetes. -Add on hemoglobin A1c   Bipolar disorder/anxiety and depression  -Continue Seroquel nightly once able   Hiatal hernia GERD Incidentally noted to have small hiatal hernia on CT scan.  Patient had not been on any antacid medication at home -Continue  PPI at home         Pain control - [facility] [place] Substance [place] System database was reviewed. and patient was instructed, not to drive, operate heavy machinery, perform activities at heights, swimming or participation in water activities or provide baby-sitting services while on Pain, Sleep and Anxiety  Medications; until their outpatient Physician has advised to do so again. Also recommended to not to take more than prescribed Pain, Sleep and Anxiety Medications.  Consultants: cardiology Procedures performed:n/a Disposition: Home Diet recommendation:  Discharge Diet Orders (From admission, onward)     Start     Ordered   07/19/22 0000  Diet - low sodium heart healthy        07/19/22 0729           Cardiac and Carb modified diet DISCHARGE MEDICATION: Allergies as of 07/19/2022       Reactions   Flexeril [cyclobenzaprine] Hives   Lamotrigine Hives        Medication List     STOP taking these medications    amLODipine 5 MG tablet Commonly known as: NORVASC       TAKE these medications    apixaban 5 MG Tabs tablet Commonly known as: ELIQUIS Take 1 tablet (5 mg total) by mouth 2 (two) times daily.   diltiazem 180 MG 24 hr capsule Commonly known as: CARDIZEM CD Take 1 capsule (180 mg total) by mouth daily.   hydrOXYzine 25 MG tablet Commonly known as: ATARAX Take 25 mg by mouth every 6 (six) hours as needed for anxiety.   montelukast 10 MG tablet Commonly known as: SINGULAIR Take 10 mg by mouth daily.   pantoprazole 40 MG tablet Commonly known as: PROTONIX Take 1 tablet (40 mg total) by mouth daily.   QUEtiapine 400 MG tablet Commonly known as: SEROQUEL Take 400 mg by mouth at bedtime.   sucralfate 1 GM/10ML suspension Commonly known as: CARAFATE Take 10 mLs (1 g total) by mouth 3 (three) times daily for 7 days.   Symbicort 160-4.5 MCG/ACT inhaler Generic drug: budesonide-formoterol Inhale 2 puffs into the lungs daily as needed (SOB).        Follow-up Information     Meriam SpraguePemberton, Heather E, MD Follow up on 09/09/2022.   Specialties: Cardiology, Radiology Why: @8 :20am for hospital follow up. Please arrive 15 minutes early. Contact information: [address] [place] [address] [phone]                Discharge  Exam: Ceasar MonsFiled Weights   07/17/22 1218  Weight: 58.1 kg   Constitutional: Middle-aged female who appears to be in no acute distress Eyes: PERRL, lids and conjunctivae normal ENMT: Mucous membranes are moist. Posterior pharynx clear of any exudate or lesions  Neck: normal, supple, no masses, no thyromegaly Respiratory: clear to auscultation bilaterally, no wheezing, no crackles. Normal respiratory effort. No accessory muscle use.  Cardiovascular: Regular rate and rhythm, no murmurs / rubs / gallops. No extremity edema. 2+ pedal pulses. No carotid bruits.  Abdomen: no tenderness, no masses palpated. No hepatosplenomegaly. Bowel sounds positive.  Musculoskeletal: no clubbing / cyanosis. No joint deformity upper and lower extremities. Good ROM, no contractures. Normal muscle tone.  Skin: no rashes, lesions, ulcers. No induration Neurologic: CN 2-12 grossly intact. Sensation intact, DTR normal. Strength 5/5 in all 4.  Psychiatric: Normal judgment and insight. Alert and oriented x 3. Normal mood.    Condition at discharge: good  The results of significant diagnostics from this  hospitalization (including imaging, microbiology, ancillary and laboratory) are listed below for reference.   Imaging Studies: ECHOCARDIOGRAM COMPLETE  Result Date: 07/18/2022    ECHOCARDIOGRAM REPORT   Patient Name:   [name] Date of Exam: 07/18/2022 Medical Rec #:  [id number]           Height:       60.0 in Accession #:    [id number]          Weight:       128.0 lb Date of Birth:  September 04, 1981           BSA:          1.544 m Patient Age:    41 years            BP:           111/83 mmHg Patient Gender: F                   HR:           82 bpm. Exam Location:  Inpatient Procedure: 2D Echo, 3D Echo, Color Doppler and Cardiac Doppler Indications:    I48.91* Unspecified atrial fibrillation  History:        Patient has prior history of Echocardiogram examinations, most                 recent 01/14/2013. Arrythmias:Atrial  Fibrillation; Risk                 Factors:Hypertension.  Sonographer:    Irving Burton Senior RDCS Referring Phys: [phone] RONDELL A SMITH IMPRESSIONS  1. Left ventricular ejection fraction, by estimation, is 70 to 75%. Left ventricular ejection fraction by PLAX is 72 %. The left ventricle has hyperdynamic function. The left ventricle has no regional wall motion abnormalities. Left ventricular diastolic parameters were normal.  2. Right ventricular systolic function is low normal. The right ventricular size is normal. Tricuspid regurgitation signal is inadequate for assessing PA pressure.  3. The mitral valve is abnormal. Trivial mitral valve regurgitation.  4. The aortic valve is tricuspid. Aortic valve regurgitation is mild. Mild aortic valve stenosis.  5. The inferior vena cava is normal in size with greater than 50% respiratory variability, suggesting right atrial pressure of 3 mmHg. FINDINGS  Left Ventricle: Left ventricular ejection fraction, by estimation, is 70 to 75%. Left ventricular ejection fraction by PLAX is 72 %. The left ventricle has hyperdynamic function. The left ventricle has no regional wall motion abnormalities. The left ventricular internal cavity size was normal in size. There is no left ventricular hypertrophy. Left ventricular diastolic parameters were normal. Right Ventricle: The right ventricular size is normal. No increase in right ventricular wall thickness. Right ventricular systolic function is low normal. Tricuspid regurgitation signal is inadequate for assessing PA pressure. Left Atrium: Left atrial size was normal in size. Right Atrium: Right atrial size was normal in size. Pericardium: There is no evidence of pericardial effusion. Mitral Valve: The mitral valve is abnormal. There is mild calcification of the posterior mitral valve leaflet(s). Mild mitral annular calcification. Trivial mitral valve regurgitation. Tricuspid Valve: The tricuspid valve is grossly normal. Tricuspid valve  regurgitation is trivial. Aortic Valve: The aortic valve is tricuspid. Aortic valve regurgitation is mild. Aortic regurgitation PHT measures 367 msec. Mild aortic stenosis is present. Aortic valve mean gradient measures 6.0 mmHg. Aortic valve peak gradient measures 11.8 mmHg. Aortic valve area, by VTI measures 1.96 cm. Pulmonic Valve: The pulmonic valve was normal in structure. Pulmonic  valve regurgitation is not visualized. Aorta: The aortic root and ascending aorta are structurally normal, with no evidence of dilitation. Venous: The inferior vena cava is normal in size with greater than 50% respiratory variability, suggesting right atrial pressure of 3 mmHg. IAS/Shunts: No atrial level shunt detected by color flow Doppler.  LEFT VENTRICLE PLAX 2D LV EF:         Left            Diastology                ventricular     LV e' medial:    7.94 cm/s                ejection        LV E/e' medial:  7.5                fraction by     LV e' lateral:   11.90 cm/s                PLAX is 72      LV E/e' lateral: 5.0                %. LVIDd:         3.70 cm LVIDs:         2.20 cm LV PW:         0.90 cm LV IVS:        0.90 cm LVOT diam:     1.80 cm LV SV:         56 LV SV Index:   36 LVOT Area:     2.54 cm  RIGHT VENTRICLE RV S prime:     10.60 cm/s TAPSE (M-mode): 2.2 cm LEFT ATRIUM             Index        RIGHT ATRIUM          Index LA diam:        3.10 cm 2.01 cm/m   RA Area:     8.92 cm LA Vol (A2C):   21.9 ml 14.18 ml/m  RA Volume:   17.55 ml 11.37 ml/m LA Vol (A4C):   31.1 ml 20.14 ml/m LA Biplane Vol: 27.7 ml 17.94 ml/m  AORTIC VALVE AV Area (Vmax):    1.73 cm AV Area (Vmean):   1.92 cm AV Area (VTI):     1.96 cm AV Vmax:           172.00 cm/s AV Vmean:          114.000 cm/s AV VTI:            0.285 m AV Peak Grad:      11.8 mmHg AV Mean Grad:      6.0 mmHg LVOT Vmax:         117.00 cm/s LVOT Vmean:        86.000 cm/s LVOT VTI:          0.220 m LVOT/AV VTI ratio: 0.77 AI PHT:            367 msec  AORTA Ao  Root diam: 2.60 cm Ao Asc diam:  2.30 cm MITRAL VALVE MV Area (PHT): 2.53 cm    SHUNTS MV Decel Time: 300 msec    Systemic VTI:  0.22 m MV E velocity: 59.60 cm/s  Systemic Diam: 1.80 cm MV A velocity: 63.80 cm/s MV E/A ratio:  0.93 Zoila Shutter MD Electronically signed by  Zoila Shutter MD Signature Date/Time: 07/18/2022/11:29:21 AM    Final    CT ABDOMEN PELVIS W CONTRAST  Result Date: 07/17/2022 CLINICAL DATA:  Abdominal pain, acute, nonlocalized EXAM: CT ABDOMEN AND PELVIS WITH CONTRAST TECHNIQUE: Multidetector CT imaging of the abdomen and pelvis was performed using the standard protocol following bolus administration of intravenous contrast. RADIATION DOSE REDUCTION: This exam was performed according to the departmental dose-optimization program which includes automated exposure control, adjustment of the mA and/or kV according to patient size and/or use of iterative reconstruction technique. CONTRAST:  69mL OMNIPAQUE IOHEXOL 300 MG/ML  SOLN COMPARISON:  None Available. FINDINGS: Lower chest: No acute abnormality. Hepatobiliary: No focal liver abnormality is seen. No gallstones, gallbladder wall thickening, or biliary dilatation. Pancreas: Unremarkable. No pancreatic ductal dilatation or surrounding inflammatory changes. Spleen: No splenic injury or perisplenic hematoma. Couple of soft tissue densities at the upper anterior aspect of the spleen with the larger density measuring 1.1 cm likely splenosis (image 23/3). Adrenals/Urinary Tract: Adrenal glands are unremarkable. Kidneys are normal, without renal calculi, focal lesion, or hydronephrosis. Bladder is unremarkable. Stomach/Bowel: Small hiatal hernia. Stomach is within normal limits. Appendix is normal. No evidence of bowel wall thickening, distention, or inflammatory changes. Vascular/Lymphatic: No significant vascular findings are present. No enlarged abdominal or pelvic lymph nodes. Reproductive: Uterus and bilateral adnexa are unremarkable. Other:  No abdominal wall hernia or abnormality. No abdominopelvic ascites. Musculoskeletal: No acute or significant osseous findings. IMPRESSION: 1.  Small hiatal hernia. 2. No free air, fluid, inflammatory changes, mass or significant lymphadenopathy. Bowel-gas pattern is nonobstructive. Electronically Signed   By: Marjo Bicker M.D.   On: 07/17/2022 09:37    Microbiology: Results for orders placed or performed in visit on 12/22/17  GC/Chlamydia Probe Amp([facility])     Status: None   Collection Time: 12/22/17  4:53 PM   Specimen: Genital   VA  Result Value Ref Range Status   Chlamydia trachomatis, NAA Negative Negative Final   Neisseria gonorrhoeae by PCR Negative Negative Final    Labs: CBC: Recent Labs  Lab 07/17/22 0305 07/18/22 0145  WBC 9.3 9.0  HGB 14.9 14.2  HCT 42.9 40.6  MCV 86.0 85.3  PLT 458* 390   Basic Metabolic Panel: Recent Labs  Lab 07/17/22 0305 07/17/22 1646 07/18/22 0145 07/18/22 1316  NA 142  --  136 137  K 3.7  --  2.8* 3.3*  CL 105  --  103 105  CO2 24  --  25 23  GLUCOSE 187*  --  97 118*  BUN 5*  --  <5* 5*  CREATININE 0.58  --  0.60 0.61  CALCIUM 10.1  --  9.4 9.4  MG  --  1.8  --   --   PHOS  --  4.2  --  4.6   Liver Function Tests: Recent Labs  Lab 07/17/22 0305 07/18/22 1316  AST 16  --   ALT 18  --   ALKPHOS 74  --   BILITOT 0.6  --   PROT 8.0  --   ALBUMIN 4.3 3.8   CBG: No results for input(s): "GLUCAP" in the last 168 hours.  Discharge time spent: less than 30 minutes.  Signed: Thomas Hoff, MD [facility] 07/19/2022

## 2022-07-19 NOTE — Progress Notes (Signed)
RN went over d/c summary w/ pt and NT removed PIVs. Belongings w/ pt. Pt will d/c once she has letter for work.   Pt has letter for work and NT is transporting pt to for-hire vehicle to transport pt home.

## 2022-07-19 NOTE — Progress Notes (Signed)
Maintained NSR. Hypokalemia corrected. Okay for discharge today from cardiology point of view.  Has appointment scheduled in early October with Dr. Shari Prows.

## 2022-07-19 NOTE — TOC Transition Note (Signed)
Transition of Care [facility]) - CM/SW Discharge Note   Patient Details  Name: [name] MRN: [id number] Date of Birth: February 01, 1981  Transition of Care [facility]) CM/SW Contact:  Beckie Busing, RN Phone Number:[phone]  07/19/2022, 8:11 AM   Clinical Narrative:    Patient with discharge orders no TOC needs noted         Patient Goals and CMS Choice        Discharge Placement                       Discharge Plan and Services                                     Social Determinants of Health (SDOH) Interventions     Readmission Graves Interventions     No data to display

## 2022-09-05 ENCOUNTER — Other Ambulatory Visit: Payer: Self-pay | Admitting: Internal Medicine

## 2022-09-05 DIAGNOSIS — R053 Chronic cough: Secondary | ICD-10-CM

## 2022-09-07 NOTE — Progress Notes (Signed)
Cardiology Office Note:    Date:  09/09/2022   ID:  [name], DOB 1981-03-20, MRN [id number]  PCP:  Sueanne Margarita, [place] [facility] Cardiologist:  Freada Bergeron, MD   Referring MD: Sueanne Margarita, DO     History of Present Illness:    [name] is a 41 y.o. female with a hx of paroxysmal Afib who presents to clinic for follow-up.  Patient was seen by Cardiology in 2014 when she was initially diagnosed with Afib with RVR after presenting with nausea/vomiting from drinking the night before. During that admission, she was placed on IV dilt and ultimately converted to NSR. TTE at that time with LVEF 55-60%, mild AR. She was discharged home on PO dilt and ASA but she did not continue to take them. She was not seen by Cardiology in follow-up.  Presented to [facility] on 07/17/22 with nausea/vomiting and abdominal pain. On admission here, she was in Afib with RVR with HR 128. Labs unremarkable. CT abdomen/pelvis with small hiatal hernia but no acute pathology. She was thought to have gastritis. She was placed on dilt gtt with improvement. She was started on apixaban and dilt with good response.  Today, the patient overall feels well. No palpitations, lightheadedness, dizziness, orthopnea, or chest pain. Tolerating medications without issues. No bleeding issues with the apixaban. Has abstained from alcohol.   Past Medical History:  Diagnosis Date   Anxiety    Back pain, chronic    Depression    hx of    Dysrhythmia 2013   hx of a-fib   Ectopic pregnancy    GERD (gastroesophageal reflux disease)    Headache    occasional    Hypertension    was told, but never on meds   Infection    UTI   Ovarian cyst    Urinary tract infection     Past Surgical History:  Procedure Laterality Date   CLEFT PALATE REPAIR     etopical surgery     LAPAROSCOPY FOR ECTOPIC PREGNANCY  2011   TYMPANOPLASTY Right 08/03/2015   Procedure: RIGHT MEDIAL TECHNIQUE  TYMPANOPLASTY;  Surgeon: Jodi Marble, MD;  Location: [place];  Service: ENT;  Laterality: Right;   VENTRAL HERNIA REPAIR N/A 03/26/2015   Procedure: LAPAROSCOPIC REPAIR OF INCARCERATED SUPERUMBILICAL HERNIA X2 AND UMBILICAL HERNIA REPAIR WITH MESH;  Surgeon: Michael Boston, MD;  Location[facility];  Service: General;  Laterality: N/A;    Current Medications: Current Meds  Medication Sig   amphetamine-dextroamphetamine (ADDERALL XR) 5 MG 24 hr capsule Take 5 mg by mouth daily as needed.   famotidine (PEPCID) 20 MG tablet Take 20 mg by mouth daily as needed.   hydrOXYzine (ATARAX) 25 MG tablet Take 25 mg by mouth every 6 (six) hours as needed for anxiety.   montelukast (SINGULAIR) 10 MG tablet Take 10 mg by mouth daily.   QUEtiapine (SEROQUEL) 400 MG tablet Take 400 mg by mouth at bedtime.   SYMBICORT 160-4.5 MCG/ACT inhaler Inhale 2 puffs into the lungs daily as needed (SOB).     Allergies:   Flexeril [cyclobenzaprine] and Lamotrigine   Social History   Socioeconomic History   Marital status: Single    Spouse name: Not on file   Number of children: Not on file   Years of education: Not on file   Highest education level: Not on file  Occupational History   Not on file  Tobacco Use   Smoking status:  Every Day    Packs/day: 0.25    Years: 15.00    Total pack years: 3.75    Types: Cigarettes   Smokeless tobacco: Never  Vaping Use   Vaping Use: Never used  Substance and Sexual Activity   Alcohol use: Yes    Alcohol/week: 1.0 standard drink of alcohol    Types: 1 Standard drinks or equivalent per week    Comment: social   Drug use: No   Sexual activity: Yes    Partners: Male    Birth control/protection: None  Other Topics Concern   Not on file  Social History Narrative   Not on file   Social Determinants of Health   Financial Resource Strain: Not on file  Food Insecurity: Not on file  Transportation Needs: Not on file  Physical Activity: Not on file   Stress: Not on file  Social Connections: Not on file     Family History: The patient's family history includes Cancer in her maternal grandfather and sister; Colon cancer in her maternal grandfather; Heart disease in her maternal grandmother; Hypertension in her father and mother. There is no history of Other, Hearing loss, Rectal cancer, Stomach cancer, or Esophageal cancer.  ROS:   Please see the history of present illness.     All other systems reviewed and are negative.  EKGs/Labs/Other Studies Reviewed:    The following studies were reviewed today: TTE 2022-08-09: IMPRESSIONS   1. Left ventricular ejection fraction, by estimation, is 70 to 75%. Left  ventricular ejection fraction by PLAX is 72 %. The left ventricle has  hyperdynamic function. The left ventricle has no regional wall motion  abnormalities. Left ventricular  diastolic parameters were normal.   2. Right ventricular systolic function is low normal. The right  ventricular size is normal. Tricuspid regurgitation signal is inadequate  for assessing PA pressure.   3. The mitral valve is abnormal. Trivial mitral valve regurgitation.   4. The aortic valve is tricuspid. Aortic valve regurgitation is mild.  Mild aortic valve stenosis.   5. The inferior vena cava is normal in size with greater than 50%  respiratory variability, suggesting right atrial pressure of 3 mmHg. TTE 01/2013: Study Conclusions   - Left ventricle: The cavity size was normal. Systolic    function was normal. The estimated ejection fraction was    in the range of 55% to 60%. Wall motion was normal; there    were no regional wall motion abnormalities. Left    ventricular diastolic function parameters were normal.  - Aortic valve: Mild regurgitation.   EKG:  EKG is  ordered today.  The ekg ordered today demonstrates NSR with HR 92  Recent Labs: 07/17/2022: ALT 18; Magnesium 1.8; TSH 2.665 08/09/2022: Hemoglobin 14.2; Platelets 390 07/19/2022: BUN  <5; Creatinine, Ser 0.65; Potassium 4.3; Sodium 141  Recent Lipid Panel    Component Value Date/Time   CHOL 103 09/01/2014 1134   TRIG 99 09/01/2014 1134   HDL 56 09/01/2014 1134   CHOLHDL 1.8 09/01/2014 1134   VLDL 20 09/01/2014 1134   LDLCALC 27 09/01/2014 1134     Risk Assessment/Calculations:    CHA2DS2-VASc Score = 2   This indicates a 2.2% annual risk of stroke. The patient's score is based upon: CHF History: 0 HTN History: 1 Diabetes History: 0 Stroke History: 0 Vascular Disease History: 0 Age Score: 0 Gender Score: 1          Physical Exam:    VS:  BP 128/82  Pulse 97   Ht 5' (1.524 m)   Wt 143 lb (64.9 kg)   SpO2 98%   BMI 27.93 kg/m     Wt Readings from Last 3 Encounters:  09/09/22 143 lb (64.9 kg)  07/17/22 128 lb (58.1 kg)  09/10/21 135 lb 6 oz (61.4 kg)     GEN:  Well nourished, well developed in no acute distress HEENT: Normal NECK: No JVD; No carotid bruits CARDIAC: RRR, no murmurs, rubs, gallops RESPIRATORY:  Clear to auscultation without rales, wheezing or rhonchi  ABDOMEN: Soft, non-tender, non-distended MUSCULOSKELETAL:  No edema; No deformity  SKIN: Warm and dry NEUROLOGIC:  Alert and oriented x 3 PSYCHIATRIC:  Normal affect   ASSESSMENT:    1. PAF (paroxysmal atrial fibrillation) (HCC)   2. Primary hypertension   3. Mild aortic regurgitation    PLAN:    In order of problems listed above:  #Paroxysmal Afib with RVR: CHADs-vasc 2 for HTN and female gender. Had initial episode in 2014 and recurrent episode in 07/2022 in the setting of gastritis from alcohol use. TTE with LVEF 70-75%, normal RV, trivial MR, mild AR, mild AS. She is currently on dilt for rate control and apixaban for [facility]. Doing well and maintaining NSR. Discussed option of ILR to monitor for recurrence to see if we can stop the apixaban, however she would like to continue with current regimen. Would be a good candidate for ablation in the future if recurrence.   -Declined ILR -Continue dilt 180mg  daily -Continue apixaban 5mg  BID   #Mild AR: #Borderline Mild AS: TTE 07/18/22 with mild AR and borderline mild AS.  -Continue serial monitoring   #HTN: -Continue dilt 180mg  daily        Medication Adjustments/Labs and Tests Ordered: Current medicines are reviewed at length with the patient today.  Concerns regarding medicines are outlined above.  Orders Placed This Encounter  Procedures   EKG 12-Lead   No orders of the defined types were placed in this encounter.   Patient Instructions  Medication Instructions:  Your physician recommends that you continue on your current medications as directed. Please refer to the Current Medication list given to you today.  *If you need a refill on your cardiac medications before your next appointment, please call your pharmacy*  Follow-Up: At [facility], you and your health needs are our priority.  As part of our continuing mission to provide you with exceptional heart care, we have created designated Provider Care Teams.  These Care Teams include your primary Cardiologist (physician) and Advanced Practice Providers (APPs -  Physician Assistants and Nurse Practitioners) who all work together to provide you with the care you need, when you need it.   Your next appointment:   6 month(s)  The format for your next appointment:   In Person  Provider:   07/20/22, MD     Important Information About Sugar         Signed, , MD  09/09/2022 9:13 AM    [facility]

## 2022-09-08 ENCOUNTER — Ambulatory Visit
Admission: RE | Admit: 2022-09-08 | Discharge: 2022-09-08 | Disposition: A | Discharge status: Home / Self Care | Payer: No Typology Code available for payment source | Source: Ambulatory Visit | Attending: Internal Medicine | Admitting: Internal Medicine

## 2022-09-08 DIAGNOSIS — R053 Chronic cough: Secondary | ICD-10-CM

## 2022-09-09 ENCOUNTER — Ambulatory Visit: Payer: No Typology Code available for payment source | Attending: Cardiology | Admitting: Cardiology

## 2022-09-09 ENCOUNTER — Encounter: Payer: Self-pay | Admitting: Cardiology

## 2022-09-09 VITALS — BP 128/82 | HR 97 | Ht 60.0 in | Wt 143.0 lb

## 2022-09-09 DIAGNOSIS — I10 Essential (primary) hypertension: Secondary | ICD-10-CM | POA: Diagnosis not present

## 2022-09-09 DIAGNOSIS — I480 Paroxysmal atrial fibrillation: Secondary | ICD-10-CM

## 2022-09-09 DIAGNOSIS — I351 Nonrheumatic aortic (valve) insufficiency: Secondary | ICD-10-CM | POA: Diagnosis not present

## 2022-09-09 NOTE — Patient Instructions (Signed)
Medication Instructions:  Your physician recommends that you continue on your current medications as directed. Please refer to the Current Medication list given to you today.  *If you need a refill on your cardiac medications before your next appointment, please call your pharmacy*  Follow-Up: At  HeartCare, you and your health needs are our priority.  As part of our continuing mission to provide you with exceptional heart care, we have created designated Provider Care Teams.  These Care Teams include your primary Cardiologist (physician) and Advanced Practice Providers (APPs -  Physician Assistants and Nurse Practitioners) who all work together to provide you with the care you need, when you need it.  Your next appointment:   6 month(s)  The format for your next appointment:   In Person  Provider:   Heather E Pemberton, MD  Important Information About Sugar       

## 2022-11-03 ENCOUNTER — Telehealth: Payer: Self-pay | Admitting: Cardiology

## 2022-11-03 MED ORDER — RIVAROXABAN 20 MG PO TABS: 20.0000 mg | ORAL_TABLET | Freq: Every day | ORAL | 11 refills | Status: DC

## 2022-11-03 NOTE — Telephone Encounter (Signed)
Called patient with changes. Patient will start Xarelto 20 mg by mouth daily. Patient will pick up samples at our front desk. Included discount cards and Assistance information.

## 2022-11-03 NOTE — Telephone Encounter (Signed)
She qualifies for Xarelto 20mg  daily with supper for afib indication with CrCl > 50.

## 2022-11-03 NOTE — Telephone Encounter (Signed)
Pt c/o medication issue:  1. Name of Medication:   apixaban (ELIQUIS) 5 MG TABS tablet    2. How are you currently taking this medication (dosage and times per day)? Take 1 tablet (5 mg total) by mouth 2 (two) times daily. - Oral   3. Are you having a reaction (difficulty breathing--STAT)?   4. What is your medication issue? Pt states she is having a hard time remembering to take second pill and wants to know if she can switch xarelto one pill a  day

## 2022-11-03 NOTE — Telephone Encounter (Signed)
I spoke with patient. She does not know if Xarelto would be covered by her insurance but she would like to make change to Xarelto if possible as she has difficultly remembering to take 2nd dose of Eliquis daily

## 2022-12-05 DIAGNOSIS — Z419 Encounter for procedure for purposes other than remedying health state, unspecified: Secondary | ICD-10-CM | POA: Diagnosis not present

## 2022-12-14 ENCOUNTER — Ambulatory Visit: Payer: No Typology Code available for payment source | Admitting: Gastroenterology

## 2022-12-29 DIAGNOSIS — L299 Pruritus, unspecified: Secondary | ICD-10-CM | POA: Diagnosis not present

## 2022-12-29 DIAGNOSIS — H90A11 Conductive hearing loss, unilateral, right ear with restricted hearing on the contralateral side: Secondary | ICD-10-CM | POA: Diagnosis not present

## 2022-12-29 DIAGNOSIS — H90A32 Mixed conductive and sensorineural hearing loss, unilateral, left ear with restricted hearing on the contralateral side: Secondary | ICD-10-CM | POA: Diagnosis not present

## 2023-01-03 ENCOUNTER — Other Ambulatory Visit: Payer: Self-pay

## 2023-01-03 MED ORDER — DILTIAZEM HCL ER COATED BEADS 180 MG PO CP24: 180.0000 mg | ORAL_CAPSULE | Freq: Every day | ORAL | 9 refills | Status: DC

## 2023-01-03 NOTE — Telephone Encounter (Signed)
Pt's medication was sent to pt's pharmacy as requested. Confirmation received. Confirmation received.

## 2023-01-04 DIAGNOSIS — M25562 Pain in left knee: Secondary | ICD-10-CM | POA: Diagnosis not present

## 2023-01-05 DIAGNOSIS — Z419 Encounter for procedure for purposes other than remedying health state, unspecified: Secondary | ICD-10-CM | POA: Diagnosis not present

## 2023-01-25 ENCOUNTER — Ambulatory Visit: Payer: No Typology Code available for payment source | Admitting: Gastroenterology

## 2023-01-25 DIAGNOSIS — M25562 Pain in left knee: Secondary | ICD-10-CM | POA: Diagnosis not present

## 2023-02-01 DIAGNOSIS — M25562 Pain in left knee: Secondary | ICD-10-CM | POA: Diagnosis not present

## 2023-02-03 DIAGNOSIS — Z419 Encounter for procedure for purposes other than remedying health state, unspecified: Secondary | ICD-10-CM | POA: Diagnosis not present

## 2023-02-27 NOTE — Progress Notes (Signed)
Cardiology Office Note:    Date:  03/03/2023   ID:  [name], DOB 17-Nov-1981, MRN [id number]  PCP:  Sueanne Margarita, [place] [facility] Cardiologist:  Freada Bergeron, MD   Referring MD: Sueanne Margarita, DO     History of Present Illness:    [name] is a 42 y.o. female with a hx of paroxysmal Afib who presents to clinic for follow-up.  Patient was seen by Cardiology in 2014 when she was initially diagnosed with Afib with RVR after presenting with nausea/vomiting from drinking the night before. During that admission, she was placed on IV dilt and ultimately converted to NSR. TTE at that time with LVEF 55-60%, mild AR. She was discharged home on PO dilt and ASA but she did not continue to take them. She was not seen by Cardiology in follow-up.  Presented to [facility]17[facility]22 with nausea/vomiting and abdominal pain. On admission, she was in Afib with RVR with HR 128. Labs unremarkable. CT abdomen/pelvis with small hiatal hernia but no acute pathology. She was thought to have gastritis. She was placed on dilt gtt with improvement. She was started on apixaban and dilt with good response.  Was last seen in clinic on 09/2022 where she was doing well from a CV standpoint. TTE 07/2022 with LVEF 70-75%, low normal RV systolic function, mild AR.   Today, the patient overall feels well. States she has had no known recurrence of her Afib since prior visit. Has occasional sharp pains in the chest when laying down flat. Symptoms last 3-59min and improve with sitting up. No exertional chest pain or SOB. No LE edema, PND, orthopnea. Tolerating xarelto without issues.   Does not monitor BP at home but states it was well controlled at GI office in 110.   Past Medical History:  Diagnosis Date   Anxiety    Back pain, chronic    Depression    hx of    Dysrhythmia 2013   hx of a-fib   Ectopic pregnancy    GERD (gastroesophageal reflux disease)    Headache     occasional    Hypertension    was told, but never on meds   Infection    UTI   Ovarian cyst    Urinary tract infection     Past Surgical History:  Procedure Laterality Date   CLEFT PALATE REPAIR     etopical surgery     LAPAROSCOPY FOR ECTOPIC PREGNANCY  2011   TYMPANOPLASTY Right 08/03/2015   Procedure: RIGHT MEDIAL TECHNIQUE TYMPANOPLASTY;  Surgeon: Jodi Marble, MD;  Location: [place];  Service: ENT;  Laterality: Right;   VENTRAL HERNIA REPAIR N/A 03/26/2015   Procedure: LAPAROSCOPIC REPAIR OF INCARCERATED SUPERUMBILICAL HERNIA X2 AND UMBILICAL HERNIA REPAIR WITH MESH;  Surgeon: Michael Boston, MD;  Location: [facility];  Service: General;  Laterality: N/A;    Current Medications: Current Meds  Medication Sig   diltiazem (CARDIZEM CD) 180 MG 24 hr capsule Take 1 capsule (180 mg total) by mouth daily.   famotidine (PEPCID) 20 MG tablet Take 20 mg by mouth daily as needed.   hydrOXYzine (ATARAX) 25 MG tablet Take 25 mg by mouth every 6 (six) hours as needed for anxiety.   montelukast (SINGULAIR) 10 MG tablet Take 10 mg by mouth daily.   rivaroxaban (XARELTO) 20 MG TABS tablet Take 1 tablet (20 mg total) by mouth daily with supper.     Allergies:   Flexeril [  cyclobenzaprine] and Lamotrigine   Social History   Socioeconomic History   Marital status: Single    Spouse name: Not on file   Number of children: Not on file   Years of education: Not on file   Highest education level: Not on file  Occupational History   Not on file  Tobacco Use   Smoking status: Every Day    Packs/day: 0.25    Years: 15.00    Additional pack years: 0.00    Total pack years: 3.75    Types: Cigarettes   Smokeless tobacco: Never  Vaping Use   Vaping Use: Never used  Substance and Sexual Activity   Alcohol use: Yes    Alcohol/week: 1.0 standard drink of alcohol    Types: 1 Standard drinks or equivalent per week    Comment: social   Drug use: No   Sexual activity: Yes     Partners: Male    Birth control/protection: None  Other Topics Concern   Not on file  Social History Narrative   Not on file   Social Determinants of Health   Financial Resource Strain: Not on file  Food Insecurity: Not on file  Transportation Needs: Not on file  Physical Activity: Not on file  Stress: Not on file  Social Connections: Not on file     Family History: The patient's family history includes Cancer in her maternal grandfather and sister; Colon cancer in her maternal grandfather; Heart disease in her maternal grandmother; Hypertension in her father and mother. There is no history of Other, Hearing loss, Rectal cancer, Stomach cancer, or Esophageal cancer.  ROS:   Please see the history of present illness.     All other systems reviewed and are negative.  EKGs/Labs/Other Studies Reviewed:    The following studies were reviewed today: TTE July 23, 2022: IMPRESSIONS   1. Left ventricular ejection fraction, by estimation, is 70 to 75%. Left  ventricular ejection fraction by PLAX is 72 %. The left ventricle has  hyperdynamic function. The left ventricle has no regional wall motion  abnormalities. Left ventricular  diastolic parameters were normal.   2. Right ventricular systolic function is low normal. The right  ventricular size is normal. Tricuspid regurgitation signal is inadequate  for assessing PA pressure.   3. The mitral valve is abnormal. Trivial mitral valve regurgitation.   4. The aortic valve is tricuspid. Aortic valve regurgitation is mild.  Mild aortic valve stenosis.   5. The inferior vena cava is normal in size with greater than 50%  respiratory variability, suggesting right atrial pressure of 3 mmHg. TTE 01/2013: Study Conclusions   - Left ventricle: The cavity size was normal. Systolic    function was normal. The estimated ejection fraction was    in the range of 55% to 60%. Wall motion was normal; there    were no regional wall motion abnormalities.  Left    ventricular diastolic function parameters were normal.  - Aortic valve: Mild regurgitation.   EKG:  No new ECG today.  Recent Labs: 07/17/2022: ALT 18; Magnesium 1.8; TSH 2.665 Jul 23, 2022: Hemoglobin 14.2; Platelets 390 07/19/2022: BUN <5; Creatinine, Ser 0.65; Potassium 4.3; Sodium 141  Recent Lipid Panel    Component Value Date/Time   CHOL 103 09/01/2014 1134   TRIG 99 09/01/2014 1134   HDL 56 09/01/2014 1134   CHOLHDL 1.8 09/01/2014 1134   VLDL 20 09/01/2014 1134   LDLCALC 27 09/01/2014 1134     Risk Assessment/Calculations:    CHA2DS2-VASc  Score = 2   This indicates a 2.2% annual risk of stroke. The patient's score is based upon: CHF History: 0 HTN History: 1 Diabetes History: 0 Stroke History: 0 Vascular Disease History: 0 Age Score: 0 Gender Score: 1          Physical Exam:    VS:  BP 130/82   Pulse 79   Ht 5' (1.524 m)   Wt 156 lb (70.8 kg)   SpO2 98%   BMI 30.47 kg/m     Wt Readings from Last 3 Encounters:  03/03/23 156 lb (70.8 kg)  02/28/23 156 lb 3.2 oz (70.9 kg)  09/09/22 143 lb (64.9 kg)     GEN:  Well nourished, well developed in no acute distress HEENT: Normal NECK: No JVD; No carotid bruits CARDIAC: RRR, no murmurs, rubs, gallops RESPIRATORY:  Clear to auscultation without rales, wheezing or rhonchi  ABDOMEN: Soft, non-tender, non-distended MUSCULOSKELETAL:  No edema; No deformity  SKIN: Warm and dry NEUROLOGIC:  Alert and oriented x 3 PSYCHIATRIC:  Normal affect   ASSESSMENT:    1. PAF (paroxysmal atrial fibrillation) ([place])   2. Primary hypertension   3. Mild aortic regurgitation   4. Secondary hypercoagulable state ([place])     PLAN:    In order of problems listed above:  #Paroxysmal Afib with RVR: CHADs-vasc 2 for HTN and female gender. Had initial episode in 2014 and recurrent episode in 07/2022 in the setting of gastritis from alcohol use. TTE with LVEF 70-75%, normal RV, trivial MR, mild AR, mild AS. She is  currently on dilt for rate control and xarelto for [facility]. Doing well and maintaining NSR. Discussed option of ILR to monitor for recurrence to see if we can stop the [facility], however she would like to continue with current regimen. Would be a good candidate for ablation in the future if recurrence.  -Declined ILR -Continue dilt 180mg  daily -Continue xarelto 20mg  daily   #Mild AR: #Borderline Mild AS: TTE 07/18/22 with mild AR and borderline mild AS.  -Continue serial monitoring   #HTN: -Continue dilt 180mg  daily -Well controlled on repeat check today  #CV screening: -Check lipid panel today     Medication Adjustments/Labs and Tests Ordered: Current medicines are reviewed at length with the patient today.  Concerns regarding medicines are outlined above.  Orders Placed This Encounter  Procedures   Lipid panel   No orders of the defined types were placed in this encounter.   Patient Instructions  Medication Instructions:  Your physician recommends that you continue on your current medications as directed. Please refer to the Current Medication list given to you today.  *If you need a refill on your cardiac medications before your next appointment, please call your pharmacy*   Lab Work: lipids If you have labs (blood work) drawn today and your tests are completely normal, you will receive your results only by: [place] (if you have MyChart) OR A paper copy in the mail If you have any lab test that is abnormal or we need to change your treatment, we will call you to review the results.   Follow-Up: At [facility], you and your health needs are our priority.  As part of our continuing mission to provide you with exceptional heart care, we have created designated Provider Care Teams.  These Care Teams include your primary Cardiologist (physician) and Advanced Practice Providers (APPs -  Physician Assistants and Nurse Practitioners) who all work together to provide you  with  the care you need, when you need it.   Your next appointment:   1 year(s)  Provider:   Freada Bergeron, MD        Signed, Freada Bergeron, MD  03/03/2023 8:53 AM    [place]

## 2023-02-28 ENCOUNTER — Telehealth: Payer: Self-pay

## 2023-02-28 ENCOUNTER — Encounter: Payer: Self-pay | Admitting: Gastroenterology

## 2023-02-28 ENCOUNTER — Ambulatory Visit (INDEPENDENT_AMBULATORY_CARE_PROVIDER_SITE_OTHER): Payer: Medicaid Other | Admitting: Gastroenterology

## 2023-02-28 VITALS — BP 110/70 | HR 76 | Ht 60.0 in | Wt 156.2 lb

## 2023-02-28 DIAGNOSIS — R194 Change in bowel habit: Secondary | ICD-10-CM | POA: Diagnosis not present

## 2023-02-28 DIAGNOSIS — K219 Gastro-esophageal reflux disease without esophagitis: Secondary | ICD-10-CM

## 2023-02-28 DIAGNOSIS — Z7901 Long term (current) use of anticoagulants: Secondary | ICD-10-CM | POA: Insufficient documentation

## 2023-02-28 NOTE — Telephone Encounter (Signed)
[facility] Pre-operative Risk Assessment     Request for surgical clearance:     Endoscopy Procedure  What type of surgery is being performed?     05/05/23  When is this surgery scheduled?    Colonoscopy and EGD  What type of clearance is required ?   Pharmacy  Are there any medications that need to be held prior to surgery and how long? Xarelto 24 hours   Practice name and name of physician performing surgery?      [facility]  What is your office phone and fax number?      Phone- [phone]  Fax717 [phone]  Anesthesia type (None, local, MAC, general) ?       MAC

## 2023-02-28 NOTE — Telephone Encounter (Signed)
Patient with diagnosis of atrial fibrillation on Xarelto for anticoagulation.    Procedure:colonoscopy/EGD Date of procedure: 05/05/23   CHA2DS2-VASc Score = 2   This indicates a 2.2% annual risk of stroke. The patient's score is based upon: CHF History: 0 HTN History: 1 Diabetes History: 0 Stroke History: 0 Vascular Disease History: 0 Age Score: 0 Gender Score: 1       CrCl 126 Platelet count 390  Per office protocol, patient can hold Xarelto for 1 days prior to procedure.   Patient will not need bridging with Lovenox (enoxaparin) around procedure.  **This guidance is not considered finalized until pre-operative APP has relayed final recommendations.**

## 2023-02-28 NOTE — Patient Instructions (Signed)
_______________________________________________________  If your blood pressure at your visit was 140/90 or greater, please contact your primary care physician to follow up on this.  _______________________________________________________  If you are age 44 or older, your body mass index should be between 23-30. Your Body mass index is 30.51 kg/m. If this is out of the aforementioned range listed, please consider follow up with your Primary Care Provider.  If you are age 20 or younger, your body mass index should be between 19-25. Your Body mass index is 30.51 kg/m. If this is out of the aformentioned range listed, please consider follow up with your Primary Care Provider.   You have been scheduled for a colonoscopy and Pre-visit today.  The [facility] providers would like to encourage you to use [facility] to communicate with providers for non-urgent requests or questions.  Due to long hold times on the telephone, sending your provider a message by [facility] may be a faster and more efficient way to get a response.  Please allow 48 business hours for a response.  Please remember that this is for non-urgent requests.   It was a pleasure to see you today!  Thank you for trusting me with your gastrointestinal care!

## 2023-02-28 NOTE — Telephone Encounter (Signed)
Will update all parties involved, pt has appt with DR. Pemberton 03/03/23. Will add need pre op clearance to appt notes.

## 2023-02-28 NOTE — Telephone Encounter (Signed)
Primary Cardiologist:Heather Renae Fickle, MD  Chart reviewed as part of pre-operative protocol coverage. Because of Zakari O Sear's past medical history and time since last visit, he/she will require a follow-up visit in order to better assess preoperative cardiovascular risk.  Pre-op covering staff: - Patient has existing appointment with Dr. Johney Frame on 03/03/23 at which time clearance will be addressed. Appointment notes have been updated.  - Please contact requesting surgeon's office via preferred method (i.e, phone, fax) to inform them of need for appointment prior to surgery.  If applicable, this message will also be routed to pharmacy pool for input on holding anticoagulant agent as requested below so that this information is available at time of patient's appointment.

## 2023-02-28 NOTE — Progress Notes (Signed)
02/28/2023 [name] [id number] 26-Sep-1981   HISTORY OF PRESENT ILLNESS: This is a 42 year old female who was a patient of Dr. Ardis Hughs.  Her care will be assumed by Dr. Lyndel Safe in Dr. Ardis Hughs absence.  She presents here today with complaints of reflux and diarrhea.  She tells me that lately she has been having a lot of acid reflux.  She has pantoprazole 40 mg daily and Pepcid 20 mg daily, but her PCP has been trying to take her off of pantoprazole.  She says that when she does not take it she has a lot of symptoms.  She has had some burning discomfort in her chest.  She has also been having loose and watery stool with stomach cramps.  Does have occasional solid stools.  She complained of diarrhea back in 2022 when she last saw Dr. Ardis Hughs as well and they previously thought that maybe it was due to her red bull consumption.  Says that now she really only drinks water.  She denies any rectal bleeding.  She is on Xarelto 20 mg daily as prescribed by Dr. Johney Frame for atrial fibrillation.  Past Medical History:  Diagnosis Date   Anxiety    Back pain, chronic    Depression    hx of    Dysrhythmia 2013   hx of a-fib   Ectopic pregnancy    GERD (gastroesophageal reflux disease)    Headache    occasional    Hypertension    was told, but never on meds   Infection    UTI   Ovarian cyst    Urinary tract infection    Past Surgical History:  Procedure Laterality Date   CLEFT PALATE REPAIR     etopical surgery     LAPAROSCOPY FOR ECTOPIC PREGNANCY  2011   TYMPANOPLASTY Right 08/03/2015   Procedure: RIGHT MEDIAL TECHNIQUE TYMPANOPLASTY;  Surgeon: Jodi Marble, MD;  Location: [facility];  Service: ENT;  Laterality: Right;   VENTRAL HERNIA REPAIR N/A 03/26/2015   Procedure: LAPAROSCOPIC REPAIR OF INCARCERATED SUPERUMBILICAL HERNIA X2 AND UMBILICAL HERNIA REPAIR WITH MESH;  Surgeon: Michael Boston, MD;  Location: [facility];  Service: General;  Laterality: N/A;    reports that  she has been smoking cigarettes. She has a 3.75 pack-year smoking history. She has never used smokeless tobacco. She reports current alcohol use of about 1.0 standard drink of alcohol per week. She reports that she does not use drugs. family history includes Cancer in her maternal grandfather and sister; Colon cancer in her maternal grandfather; Heart disease in her maternal grandmother; Hypertension in her father and mother. Allergies  Allergen Reactions   Flexeril [Cyclobenzaprine] Hives   Lamotrigine Hives      Outpatient Encounter Medications as of 02/28/2023  Medication Sig   diltiazem (CARDIZEM CD) 180 MG 24 hr capsule Take 1 capsule (180 mg total) by mouth daily.   famotidine (PEPCID) 20 MG tablet Take 20 mg by mouth daily as needed.   hydrOXYzine (ATARAX) 25 MG tablet Take 25 mg by mouth every 6 (six) hours as needed for anxiety.   montelukast (SINGULAIR) 10 MG tablet Take 10 mg by mouth daily.   rivaroxaban (XARELTO) 20 MG TABS tablet Take 1 tablet (20 mg total) by mouth daily with supper.   pantoprazole (PROTONIX) 40 MG tablet Take 1 tablet (40 mg total) by mouth daily.   [DISCONTINUED] amphetamine-dextroamphetamine (ADDERALL XR) 5 MG 24 hr capsule Take 5 mg by mouth daily as needed.   [  DISCONTINUED] QUEtiapine (SEROQUEL) 400 MG tablet Take 400 mg by mouth at bedtime.   [DISCONTINUED] sucralfate (CARAFATE) 1 GM/10ML suspension Take 10 mLs (1 g total) by mouth 3 (three) times daily for 7 days.   [DISCONTINUED] SYMBICORT 160-4.5 MCG/ACT inhaler Inhale 2 puffs into the lungs daily as needed (SOB).   No facility-administered encounter medications on file as of 02/28/2023.     REVIEW OF SYSTEMS  : All other systems reviewed and negative except where noted in the History of Present Illness.   PHYSICAL EXAM: BP 110/70   Pulse 76   Ht 5' (1.524 m)   Wt 156 lb 3.2 oz (70.9 kg)   BMI 30.51 kg/m  General: Well developed AA female in no acute distress Head: Normocephalic and  atraumatic Eyes:  Sclerae anicteric, conjunctiva pink. Ears: Normal auditory acuity Lungs: Clear throughout to auscultation; no W/R/R. Heart: Regular rate and rhythm; no M/R/G. Abdomen: Soft, non-distended.  BS present.  Mild diffuse TTP. Rectal:  Will be done at the time of colonoscopy. Musculoskeletal: Symmetrical with no gross deformities  Skin: No lesions on visible extremities Extremities: No edema  Neurological: Alert oriented x 4, grossly non-focal Psychological:  Alert and cooperative. Normal mood and affect  ASSESSMENT AND PLAN: *GERD: Longstanding symptoms.  Never had EGD in the past.  PCP has been trying to reduce her use of pantoprazole, but has a lot of symptoms without it.  Has more symptoms in the evening.  I suggested that she take pantoprazole 40 mg with dinner and then use Pepcid 20 mg at bedtime regularly.  Will plan for EGD with Dr. Lyndel Safe. *Diarrhea: This has been a complaint on and off as well.  Reported this to Dr. Ardis Hughs in 2022, but then she says that it seemed to get better for a while before recurring again.  Will plan for colonoscopy with Dr. Lyndel Safe also. *Chronic anticoagulation with Xarelto for history of atrial fibrillation:  Will hold Xarelto for one day prior to endoscopic procedures - will instruct when and how to resume after procedure. Benefits and risks of procedure explained including risks of bleeding, perforation, infection, missed lesions, reactions to medications and possible need for hospitalization and surgery for complications. Additional rare but real risk of stroke or other vascular clotting events off of Xarelto also explained and need to seek urgent help if any signs of these problems occur. Will communicate by phone or EMR with patient's prescribing provider, Dr. Johney Frame, to confirm that holding Xarelto is reasonable in this case.     CC:  Sueanne Margarita, DO

## 2023-03-01 NOTE — Telephone Encounter (Signed)
Called and spoke to patient. She understands that she is cleared by cardiology to have the EGD in May and she will need to hold her Xarelto for 1 day, on 05-04-23. She expressed understanding.

## 2023-03-01 NOTE — Telephone Encounter (Signed)
Cleared by cardiology to proceed RG

## 2023-03-03 ENCOUNTER — Encounter: Payer: Self-pay | Admitting: Cardiology

## 2023-03-03 ENCOUNTER — Ambulatory Visit: Payer: Medicaid Other | Attending: Cardiology | Admitting: Cardiology

## 2023-03-03 VITALS — BP 130/82 | HR 79 | Ht 60.0 in | Wt 156.0 lb

## 2023-03-03 DIAGNOSIS — I10 Essential (primary) hypertension: Secondary | ICD-10-CM

## 2023-03-03 DIAGNOSIS — I480 Paroxysmal atrial fibrillation: Secondary | ICD-10-CM

## 2023-03-03 DIAGNOSIS — I351 Nonrheumatic aortic (valve) insufficiency: Secondary | ICD-10-CM

## 2023-03-03 DIAGNOSIS — D6869 Other thrombophilia: Secondary | ICD-10-CM

## 2023-03-03 NOTE — Patient Instructions (Signed)
Medication Instructions:  Your physician recommends that you continue on your current medications as directed. Please refer to the Current Medication list given to you today.  *If you need a refill on your cardiac medications before your next appointment, please call your pharmacy*   Lab Work: lipids If you have labs (blood work) drawn today and your tests are completely normal, you will receive your results only by: [place] (if you have MyChart) OR A paper copy in the mail If you have any lab test that is abnormal or we need to change your treatment, we will call you to review the results.   Follow-Up: At [facility], you and your health needs are our priority.  As part of our continuing mission to provide you with exceptional heart care, we have created designated Provider Care Teams.  These Care Teams include your primary Cardiologist (physician) and Advanced Practice Providers (APPs -  Physician Assistants and Nurse Practitioners) who all work together to provide you with the care you need, when you need it.   Your next appointment:   1 year(s)  Provider:   Freada Bergeron, MD

## 2023-03-04 LAB — LIPID PANEL
Chol/HDL Ratio: 3.4 ratio (ref 0.0–4.4)
Cholesterol, Total: 143 mg/dL (ref 100–199)
HDL: 42 mg/dL (ref >39)
LDL Chol Calc (NIH): 81 mg/dL (ref 0–99)
Triglycerides: 108 mg/dL (ref 0–149)
VLDL Cholesterol Cal: 20 mg/dL (ref 5–40)

## 2023-03-06 DIAGNOSIS — Z419 Encounter for procedure for purposes other than remedying health state, unspecified: Secondary | ICD-10-CM | POA: Diagnosis not present

## 2023-03-12 NOTE — Progress Notes (Signed)
Agree with assessment/plan.  Raj Sabri Teal, MD [facility] [phone]  

## 2023-03-29 DIAGNOSIS — F411 Generalized anxiety disorder: Secondary | ICD-10-CM | POA: Diagnosis not present

## 2023-03-29 DIAGNOSIS — F331 Major depressive disorder, recurrent, moderate: Secondary | ICD-10-CM | POA: Diagnosis not present

## 2023-04-05 DIAGNOSIS — Z419 Encounter for procedure for purposes other than remedying health state, unspecified: Secondary | ICD-10-CM | POA: Diagnosis not present

## 2023-04-05 DIAGNOSIS — M545 Low back pain, unspecified: Secondary | ICD-10-CM | POA: Diagnosis not present

## 2023-04-11 DIAGNOSIS — F331 Major depressive disorder, recurrent, moderate: Secondary | ICD-10-CM | POA: Diagnosis not present

## 2023-04-11 DIAGNOSIS — F411 Generalized anxiety disorder: Secondary | ICD-10-CM | POA: Diagnosis not present

## 2023-04-21 ENCOUNTER — Other Ambulatory Visit (HOSPITAL_COMMUNITY): Payer: Self-pay

## 2023-04-21 ENCOUNTER — Other Ambulatory Visit: Payer: Self-pay

## 2023-04-21 ENCOUNTER — Ambulatory Visit (AMBULATORY_SURGERY_CENTER): Payer: Medicaid Other

## 2023-04-21 VITALS — Ht 60.0 in | Wt 150.0 lb

## 2023-04-21 DIAGNOSIS — F411 Generalized anxiety disorder: Secondary | ICD-10-CM | POA: Diagnosis not present

## 2023-04-21 DIAGNOSIS — R197 Diarrhea, unspecified: Secondary | ICD-10-CM

## 2023-04-21 DIAGNOSIS — F331 Major depressive disorder, recurrent, moderate: Secondary | ICD-10-CM | POA: Diagnosis not present

## 2023-04-21 DIAGNOSIS — Z1211 Encounter for screening for malignant neoplasm of colon: Secondary | ICD-10-CM

## 2023-04-21 DIAGNOSIS — R194 Change in bowel habit: Secondary | ICD-10-CM

## 2023-04-21 MED ORDER — PLENVU 140 G PO SOLR
1.0000 | Freq: Once | ORAL | 0 refills | Status: DC
  Filled 2023-04-21: qty 1, 30d supply, fill #0
  Filled 2023-04-21: qty 3, 1d supply, fill #0

## 2023-04-21 MED ORDER — PLENVU 140 G PO SOLR: 1.0000 | ORAL | Status: DC

## 2023-04-21 NOTE — Progress Notes (Addendum)
[facility] Pre-Visit Note  Pre visit Date 04/21/23     [name]    [id number]    May 13, 1981  Primary Care Physician:Skakle, Eliberto Ivory, DO  Referring Physician: Charlane Ferretti, DO [address] [place],  [place] [address]  Pre visit completed via phone call  Direct   Last Colonoscopy:  Last office visit:  Procedure: Colonoscopy/ EGD Date/Time of procedure: 05/05/23 @ 1:30 Procedure MD: Chales Abrahams Procedure Diagnosis Code: R19.4 & R.19.7  Blood thinners: No   Bowel Prep: Plenvu  Constipation: No   Insurance: Medicaid [facility]  Patient instructed to use Singlecare[employer]com or GoodRx for a price reduction on prep  There were no vitals filed for this visit.  Body mass index is 29.29 kg/m.  Height: 5'0" Weight: 150lbs  Last Weight  Most recent update: 04/21/2023 10:04 AM    Weight  68 kg (150 lb)              No egg or soy allergy known to patient No issues known to patient with past sedation with any surgeries or procedures No history of difficulty with intubation No FH of Malignant Hyperthermia No weight loss medications or diet pills Patient is not on home 02 No history of A fib or A flutter  ECHO/ EF: 07/18/22 AICD/Pacemaker: no  Any cardiac testing pending: no  Allergies as of 04/21/2023 - Review Complete 03/03/2023  Allergen Reaction Noted   Flexeril [cyclobenzaprine] Hives 02/02/2015   Lamotrigine Hives 07/17/2022     MEDICATIONs Instructed to HOLD:Xarelto 1 day 05/04/23   Outpatient Encounter Medications as of 04/21/2023  Medication Sig   diltiazem (CARDIZEM CD) 180 MG 24 hr capsule Take 1 capsule (180 mg total) by mouth daily.   famotidine (PEPCID) 20 MG tablet Take 20 mg by mouth daily as needed.   hydrOXYzine (ATARAX) 25 MG tablet Take 25 mg by mouth every 6 (six) hours as needed for anxiety.   montelukast (SINGULAIR) 10 MG tablet Take 10 mg by mouth daily.   pantoprazole (PROTONIX) 40 MG tablet Take 1 tablet (40 mg total)  by mouth daily.   rivaroxaban (XARELTO) 20 MG TABS tablet Take 1 tablet (20 mg total) by mouth daily with supper.   No facility-administered encounter medications on file as of 04/21/2023.     Past Medical History:  Diagnosis Date   Anxiety    Back pain, chronic    Depression    hx of    Dysrhythmia 2013   hx of a-fib   Ectopic pregnancy    GERD (gastroesophageal reflux disease)    Headache    occasional    Hypertension    was told, but never on meds   Infection    UTI   Ovarian cyst    Urinary tract infection     Past Surgical History:  Procedure Laterality Date   CLEFT PALATE REPAIR     etopical surgery     LAPAROSCOPY FOR ECTOPIC PREGNANCY  2011   TYMPANOPLASTY Right 08/03/2015   Procedure: RIGHT MEDIAL TECHNIQUE TYMPANOPLASTY;  Surgeon: Flo Shanks, MD;  Location: [facility];  Service: ENT;  Laterality: Right;   VENTRAL HERNIA REPAIR N/A 03/26/2015   Procedure: LAPAROSCOPIC REPAIR OF INCARCERATED SUPERUMBILICAL HERNIA X2 AND UMBILICAL HERNIA REPAIR WITH MESH;  Surgeon: Karie Soda, MD;  Location: [facility];  Service: General;  Laterality: N/A;    Family History  Problem Relation Age of Onset   Hypertension Mother    Hypertension Father    Cancer Sister  Heart disease Maternal Grandmother    Cancer Maternal Grandfather    Colon cancer Maternal Grandfather    Other Neg Hx    Hearing loss Neg Hx    Rectal cancer Neg Hx    Stomach cancer Neg Hx    Esophageal cancer Neg Hx      Fall Risk:     07/17/2022    2:57 AM 07/17/2022    7:30 PM 07/18/2022    7:10 AM 07/18/2022    7:50 PM 07/19/2022    8:12 AM  Fall Risk  (RETIRED) Patient Fall Risk Level Low fall risk Low fall risk Low fall risk Low fall risk Low fall risk     Patient's chart reviewed by Cathlyn Parsons CNRA prior to previsit and patient is appropriate to undergo procedure in [facility].  Previsit completed and red dot placed by patient's name on their procedure day (on provider's schedule).       To be filled in admitting on day of procedure:  BP:               HR:                O2 Sat:               Temp:                       Blood glucose:  LMP: Menopause -  yes  Pregnancy Test:  Blood Thinners: no  Last Dose:

## 2023-05-01 ENCOUNTER — Encounter: Payer: Self-pay | Admitting: Certified Registered Nurse Anesthetist

## 2023-05-02 ENCOUNTER — Other Ambulatory Visit (HOSPITAL_COMMUNITY): Payer: Self-pay

## 2023-05-02 ENCOUNTER — Other Ambulatory Visit: Payer: Self-pay

## 2023-05-02 ENCOUNTER — Telehealth: Payer: Self-pay | Admitting: Gastroenterology

## 2023-05-02 DIAGNOSIS — F411 Generalized anxiety disorder: Secondary | ICD-10-CM | POA: Diagnosis not present

## 2023-05-02 DIAGNOSIS — Z1211 Encounter for screening for malignant neoplasm of colon: Secondary | ICD-10-CM

## 2023-05-02 DIAGNOSIS — F331 Major depressive disorder, recurrent, moderate: Secondary | ICD-10-CM | POA: Diagnosis not present

## 2023-05-02 MED ORDER — PLENVU 140 G PO SOLR: 1.0000 | Freq: Once | ORAL | 0 refills | Status: AC

## 2023-05-02 NOTE — Telephone Encounter (Signed)
New rx sent pt made aware  

## 2023-05-02 NOTE — Telephone Encounter (Signed)
Patient called stating her Plenvu medication for her colonoscopy on 05/31 has not been sent in yet. [facility]ing [facility] [address] [facility] [place]. Please advise, thank you.

## 2023-05-05 ENCOUNTER — Telehealth: Payer: Self-pay | Admitting: Gastroenterology

## 2023-05-05 ENCOUNTER — Encounter: Payer: Self-pay | Admitting: Gastroenterology

## 2023-05-05 ENCOUNTER — Ambulatory Visit (AMBULATORY_SURGERY_CENTER): Payer: Medicaid Other | Admitting: Gastroenterology

## 2023-05-05 VITALS — BP 102/58 | HR 65 | Temp 97.1°F | Resp 14 | Ht 60.0 in | Wt 150.0 lb

## 2023-05-05 DIAGNOSIS — D124 Benign neoplasm of descending colon: Secondary | ICD-10-CM

## 2023-05-05 DIAGNOSIS — I10 Essential (primary) hypertension: Secondary | ICD-10-CM | POA: Diagnosis not present

## 2023-05-05 DIAGNOSIS — K219 Gastro-esophageal reflux disease without esophagitis: Secondary | ICD-10-CM

## 2023-05-05 DIAGNOSIS — R194 Change in bowel habit: Secondary | ICD-10-CM | POA: Diagnosis not present

## 2023-05-05 DIAGNOSIS — R197 Diarrhea, unspecified: Secondary | ICD-10-CM

## 2023-05-05 DIAGNOSIS — K2970 Gastritis, unspecified, without bleeding: Secondary | ICD-10-CM

## 2023-05-05 DIAGNOSIS — D125 Benign neoplasm of sigmoid colon: Secondary | ICD-10-CM

## 2023-05-05 MED ORDER — SODIUM CHLORIDE 0.9 % IV SOLN
500.0000 mL | Freq: Once | INTRAVENOUS | Status: DC
Start: 2023-05-05 — End: 2023-05-05

## 2023-05-05 NOTE — Progress Notes (Signed)
Called to room to assist during endoscopic procedure.  Patient ID and intended procedure confirmed with present staff. Received instructions for my participation in the procedure from the performing physician.  

## 2023-05-05 NOTE — Progress Notes (Signed)
02/28/2023 [name] [id number] 1981-02-13     HISTORY OF PRESENT ILLNESS: This is a 42 year old female who was a patient of Dr. Christella Hartigan.  Her care will be assumed by Dr. Chales Abrahams in Dr. Christella Hartigan absence.  She presents here today with complaints of reflux and diarrhea.  She tells me that lately she has been having a lot of acid reflux.  She has pantoprazole 40 mg daily and Pepcid 20 mg daily, but her PCP has been trying to take her off of pantoprazole.  She says that when she does not take it she has a lot of symptoms.  She has had some burning discomfort in her chest.  She has also been having loose and watery stool with stomach cramps.  Does have occasional solid stools.  She complained of diarrhea back in 2022 when she last saw Dr. Christella Hartigan as well and they previously thought that maybe it was due to her red bull consumption.  Says that now she really only drinks water.  She denies any rectal bleeding.  She is on Xarelto 20 mg daily as prescribed by Dr. Shari Prows for atrial fibrillation.       Past Medical History:  Diagnosis Date   Anxiety     Back pain, chronic     Depression      hx of    Dysrhythmia 2013    hx of a-fib   Ectopic pregnancy     GERD (gastroesophageal reflux disease)     Headache      occasional    Hypertension      was told, but never on meds   Infection      UTI   Ovarian cyst     Urinary tract infection           Past Surgical History:  Procedure Laterality Date   CLEFT PALATE REPAIR       etopical surgery       LAPAROSCOPY FOR ECTOPIC PREGNANCY   2011   TYMPANOPLASTY Right 08/03/2015    Procedure: RIGHT MEDIAL TECHNIQUE TYMPANOPLASTY;  Surgeon: Flo Shanks, MD;  Location: [facility];  Service: ENT;  Laterality: Right;   VENTRAL HERNIA REPAIR N/A 03/26/2015    Procedure: LAPAROSCOPIC REPAIR OF INCARCERATED SUPERUMBILICAL HERNIA X2 AND UMBILICAL HERNIA REPAIR WITH MESH;  Surgeon: Karie Soda, MD;  Location: [facility];  Service: General;   Laterality: N/A;     reports that she has been smoking cigarettes. She has a 3.75 pack-year smoking history. She has never used smokeless tobacco. She reports current alcohol use of about 1.0 standard drink of alcohol per week. She reports that she does not use drugs. family history includes Cancer in her maternal grandfather and sister; Colon cancer in her maternal grandfather; Heart disease in her maternal grandmother; Hypertension in her father and mother.     Allergies  Allergen Reactions   Flexeril [Cyclobenzaprine] Hives   Lamotrigine Hives            Outpatient Encounter Medications as of 02/28/2023  Medication Sig   diltiazem (CARDIZEM CD) 180 MG 24 hr capsule Take 1 capsule (180 mg total) by mouth daily.   famotidine (PEPCID) 20 MG tablet Take 20 mg by mouth daily as needed.   hydrOXYzine (ATARAX) 25 MG tablet Take 25 mg by mouth every 6 (six) hours as needed for anxiety.   montelukast (SINGULAIR) 10 MG tablet Take 10 mg by mouth daily.   rivaroxaban (XARELTO) 20 MG TABS  tablet Take 1 tablet (20 mg total) by mouth daily with supper.   pantoprazole (PROTONIX) 40 MG tablet Take 1 tablet (40 mg total) by mouth daily.   [DISCONTINUED] amphetamine-dextroamphetamine (ADDERALL XR) 5 MG 24 hr capsule Take 5 mg by mouth daily as needed.   [DISCONTINUED] QUEtiapine (SEROQUEL) 400 MG tablet Take 400 mg by mouth at bedtime.   [DISCONTINUED] sucralfate (CARAFATE) 1 GM/10ML suspension Take 10 mLs (1 g total) by mouth 3 (three) times daily for 7 days.   [DISCONTINUED] SYMBICORT 160-4.5 MCG/ACT inhaler Inhale 2 puffs into the lungs daily as needed (SOB).    No facility-administered encounter medications on file as of 02/28/2023.        REVIEW OF SYSTEMS  : All other systems reviewed and negative except where noted in the History of Present Illness.     PHYSICAL EXAM: BP 110/70   Pulse 76   Ht 5' (1.524 m)   Wt 156 lb 3.2 oz (70.9 kg)   BMI 30.51 kg/m  General: Well developed AA female  in no acute distress Head: Normocephalic and atraumatic Eyes:  Sclerae anicteric, conjunctiva pink. Ears: Normal auditory acuity Lungs: Clear throughout to auscultation; no W/R/R. Heart: Regular rate and rhythm; no M/R/G. Abdomen: Soft, non-distended.  BS present.  Mild diffuse TTP. Rectal:  Will be done at the time of colonoscopy. Musculoskeletal: Symmetrical with no gross deformities  Skin: No lesions on visible extremities Extremities: No edema  Neurological: Alert oriented x 4, grossly non-focal Psychological:  Alert and cooperative. Normal mood and affect   ASSESSMENT AND PLAN: *GERD: Longstanding symptoms.  Never had EGD in the past.  PCP has been trying to reduce her use of pantoprazole, but has a lot of symptoms without it.  Has more symptoms in the evening.  I suggested that she take pantoprazole 40 mg with dinner and then use Pepcid 20 mg at bedtime regularly.  Will plan for EGD with Dr. Chales Abrahams. *Diarrhea: This has been a complaint on and off as well.  Reported this to Dr. Christella Hartigan in 2022, but then she says that it seemed to get better for a while before recurring again.  Will plan for colonoscopy with Dr. Chales Abrahams also. *Chronic anticoagulation with Xarelto for history of atrial fibrillation:  Will hold Xarelto for one day prior to endoscopic procedures - will instruct when and how to resume after procedure. Benefits and risks of procedure explained including risks of bleeding, perforation, infection, missed lesions, reactions to medications and possible need for hospitalization and surgery for complications. Additional rare but real risk of stroke or other vascular clotting events off of Xarelto also explained and need to seek urgent help if any signs of these problems occur. Will communicate by phone or EMR with patient's prescribing provider, Dr. Shari Prows, to confirm that holding Xarelto is reasonable in this case.       CC:  Charlane Ferretti, DO   Attending physician's note   I have  taken history, reviewed the chart and examined the patient. I performed a substantive portion of this encounter, including complete performance of at least one of the key components, in conjunction with the APP. I agree with the Advanced Practitioner's note, impression and recommendations.   For EGD/colon today   Edman Circle, MD [facility] [phone]

## 2023-05-05 NOTE — Op Note (Signed)
[name] Procedure Date: 05/05/2023 1:21 PM MRN: [id number] Endoscopist: Lynann Bologna , MD, [id number] Age: 42 Referring MD:  Date of Birth: May 16, 1981 Gender: Female Account #: [id number] Procedure:                Upper GI endoscopy Indications:              Heartburn with occasional nausea Medicines:                Monitored Anesthesia Care Procedure:                Pre-Anesthesia Assessment:                           - Prior to the procedure, a History and Physical                            was performed, and patient medications and                            allergies were reviewed. The patient's tolerance of                            previous anesthesia was also reviewed. The risks                            and benefits of the procedure and the sedation                            options and risks were discussed with the patient.                            All questions were answered, and informed consent                            was obtained. Prior Anticoagulants: The patient has                            taken Xarelto (rivaroxaban), last dose was 2 days                            prior to procedure. ASA Grade Assessment: II - A                            patient with mild systemic disease. After reviewing                            the risks and benefits, the patient was deemed in                            satisfactory condition to undergo the procedure.                           After obtaining informed consent, the endoscope was  passed under direct vision. Throughout the                            procedure, the patient's blood pressure, pulse, and                            oxygen saturations were monitored continuously. The                            GIF [id number] #[id number] was introduced through the                            mouth, and advanced to the second part of duodenum.                             The upper GI endoscopy was accomplished without                            difficulty. The patient tolerated the procedure                            well. Scope In: Scope Out: Findings:                 The examined esophagus was mildly tortuous but                            normal. Biopsies were taken with a cold forceps for                            histology.                           The Z-line was irregular and was found 35 cm from                            the incisors with salmon-colored mucosa projecting                            1 cm above the GE junction. Biopsies were taken                            with a cold forceps for histology, directed by NBI.                           Diffuse mild inflammation characterized by erythema                            was found in the gastric body and in the gastric                            antrum. Biopsies were taken with a cold forceps for  histology.                           The examined duodenum was normal. Biopsies for                            histology were taken with a cold forceps for                            evaluation of celiac disease. Complications:            No immediate complications. Estimated Blood Loss:     Estimated blood loss: none. Impression:               - Z-line irregular, 35 cm from the incisors with                            ?Short segment Barrett's. Biopsied.                           - Gastritis. Biopsied. Recommendation:           - Patient has a contact number available for                            emergencies. The signs and symptoms of potential                            delayed complications were discussed with the                            patient. Return to normal activities tomorrow.                            Written discharge instructions were provided to the                            patient.                           - Resume previous diet.                            - Continue present medications including Protonix                            40 mg p.o.QAM, Pepcid 20 mg p.o. nightly.                           - Await pathology results.                           - Proceed with colonoscopy.                           - FU with Doug Sou in 8-12 weeks. If still with  problems, further workup                           - The findings and recommendations were discussed                            with the patient's family. Lynann Bologna, MD 05/05/2023 1:56:49 PM This report has been signed electronically.

## 2023-05-05 NOTE — Op Note (Signed)
[facility] Patient Name: [name] Procedure Date: 05/05/2023 1:20 PM MRN: [id number] Endoscopist: Lynann Bologna , MD, [id number] Age: 42 Referring MD:  Date of Birth: [birth year] Gender: Female Account #: [id number] Procedure:                Colonoscopy Indications:              Clinically significant diarrhea of unexplained                            origin Medicines:                Monitored Anesthesia Care Procedure:                Pre-Anesthesia Assessment:                           - Prior to the procedure, a History and Physical                            was performed, and patient medications and                            allergies were reviewed. The patient's tolerance of                            previous anesthesia was also reviewed. The risks                            and benefits of the procedure and the sedation                            options and risks were discussed with the patient.                            All questions were answered, and informed consent                            was obtained. Prior Anticoagulants: The patient has                            taken Xarelto (rivaroxaban), last dose was 2 days                            prior to procedure. ASA Grade Assessment: II - A                            patient with mild systemic disease. After reviewing                            the risks and benefits, the patient was deemed in                            satisfactory condition to undergo the procedure.  After obtaining informed consent, the colonoscope                            was passed under direct vision. Throughout the                            procedure, the patient's blood pressure, pulse, and                            oxygen saturations were monitored continuously. The                            Olympus PCF-[id number] (#[id number]) Colonoscope was                            introduced through the anus and  advanced to the 2                            cm into the ileum. The colonoscopy was performed                            without difficulty. The patient tolerated the                            procedure well. The quality of the bowel                            preparation was good. The terminal ileum, ileocecal                            valve, appendiceal orifice, and rectum were                            photographed. Scope In: 1:35:43 PM Scope Out: 1:52:32 PM Scope Withdrawal Time: 0 hours 13 minutes 29 seconds  Total Procedure Duration: 0 hours 16 minutes 49 seconds  Findings:                 A 10 mm polyp was found in the mid descending                            colon, 50 cm from the anal verge. The polyp was                            pedunculated. The polyp was removed with a hot                            snare. Resection and retrieval were complete.                           A 12 mm polyp was found in the distal sigmoid                            colon, 25 cm from  the anal verge. The polyp was                            pedunculated. The polyp was removed with a hot                            snare. Resection and retrieval were complete.                           The colon (entire examined portion) appeared                            normal. Biopsies for histology were taken with a                            cold forceps from the entire colon for evaluation                            of microscopic colitis.                           Rare small-mouthed diverticula were found in the                            sigmoid colon and ascending colon.                           Non-bleeding internal hemorrhoids were found during                            retroflexion. The hemorrhoids were small and Grade                            I (internal hemorrhoids that do not prolapse).                           The terminal ileum appeared normal.                           The exam was otherwise  without abnormality on                            direct and retroflexion views. Complications:            No immediate complications. Estimated Blood Loss:     Estimated blood loss: none. Impression:               - One 10 mm polyp in the mid descending colon,                            removed with a hot snare. Resected and retrieved.                           - One 12 mm polyp in the distal sigmoid colon,  removed with a hot snare. Resected and retrieved.                           - Very minimal colonic diverticulosis.                           - Non-bleeding internal hemorrhoids.                           - The examined portion of the ileum was normal.                           - The examination was otherwise normal on direct                            and retroflexion views. Recommendation:           - Patient has a contact number available for                            emergencies. The signs and symptoms of potential                            delayed complications were discussed with the                            patient. Return to normal activities tomorrow.                            Written discharge instructions were provided to the                            patient.                           - Resume previous diet.                           - Resume Xarelto (rivaroxaban) at prior dose in 2                            days (from 6/3).                           - The findings and recommendations were discussed                            with the patient's family.                           - Follow biopsies. Further colonoscopy depending                            upon biopsy results. Lynann Bologna, MD 05/05/2023 2:02:09 PM This report has been signed electronically.

## 2023-05-05 NOTE — Telephone Encounter (Signed)
Patient called stating she is running late for her arrival time of 12:30pm for her procedure today at 1:30. States she should arrive at 12:45. Please advise, thank you.

## 2023-05-05 NOTE — Progress Notes (Signed)
1340 BP  167/114, Labetalol given IV, MD update, vss

## 2023-05-05 NOTE — Patient Instructions (Signed)
Resume Xarelto on Monday, 05/08/23 as normal  Await pathology results   YOU HAD AN ENDOSCOPIC PROCEDURE TODAY AT THE [facility]:   Refer to the procedure report that was given to you for any specific questions about what was found during the examination.  If the procedure report does not answer your questions, please call your gastroenterologist to clarify.  If you requested that your care partner not be given the details of your procedure findings, then the procedure report has been included in a sealed envelope for you to review at your convenience later.  YOU SHOULD EXPECT: Some feelings of bloating in the abdomen. Passage of more gas than usual.  Walking can help get rid of the air that was put into your GI tract during the procedure and reduce the bloating. If you had a lower endoscopy (such as a colonoscopy or flexible sigmoidoscopy) you may notice spotting of blood in your stool or on the toilet paper. If you underwent a bowel prep for your procedure, you may not have a normal bowel movement for a few days.  Please Note:  You might notice some irritation and congestion in your nose or some drainage.  This is from the oxygen used during your procedure.  There is no need for concern and it should clear up in a day or so.  SYMPTOMS TO REPORT IMMEDIATELY:  Following lower endoscopy (colonoscopy or flexible sigmoidoscopy):  Excessive amounts of blood in the stool  Significant tenderness or worsening of abdominal pains  Swelling of the abdomen that is new, acute  Fever of 100F or higher  Following upper endoscopy (EGD)  Vomiting of blood or coffee ground material  New chest pain or pain under the shoulder blades  Painful or persistently difficult swallowing  New shortness of breath  Fever of 100F or higher  Black, tarry-looking stools  For urgent or emergent issues, a gastroenterologist can be reached at any hour by calling [phone]. Do not use MyChart messaging for  urgent concerns.    DIET:  We do recommend a small meal at first, but then you may proceed to your regular diet.  Drink plenty of fluids but you should avoid alcoholic beverages for 24 hours.  ACTIVITY:  You should plan to take it easy for the rest of today and you should NOT DRIVE or use heavy machinery until tomorrow (because of the sedation medicines used during the test).    FOLLOW UP: Our staff will call the number listed on your records the next business day following your procedure.  We will call around 7:15- 8:00 am to check on you and address any questions or concerns that you may have regarding the information given to you following your procedure. If we do not reach you, we will leave a message.     If any biopsies were taken you will be contacted by phone or by letter within the next 1-3 weeks.  Please call us at [phone] if you have not heard about the biopsies in 3 weeks.    SIGNATURES/CONFIDENTIALITY: You and/or your care partner have signed paperwork which will be entered into your electronic medical record.  These signatures attest to the fact that that the information above on your After Visit Summary has been reviewed and is understood.  Full responsibility of the confidentiality of this discharge information lies with you and/or your care-partner.

## 2023-05-05 NOTE — Progress Notes (Signed)
Report given to PACU, vss 

## 2023-05-05 NOTE — Progress Notes (Signed)
1310 Robinul 0.1 mg IV given due large amount of secretions upon assessment.  MD made aware, vss  

## 2023-05-05 NOTE — Progress Notes (Signed)
Pt's states no medical or surgical changes since previsit or office visit. 

## 2023-05-06 DIAGNOSIS — Z419 Encounter for procedure for purposes other than remedying health state, unspecified: Secondary | ICD-10-CM | POA: Diagnosis not present

## 2023-05-08 ENCOUNTER — Telehealth: Payer: Self-pay

## 2023-05-08 NOTE — Telephone Encounter (Signed)
Left message on follow up call. 

## 2023-05-09 DIAGNOSIS — R197 Diarrhea, unspecified: Secondary | ICD-10-CM | POA: Insufficient documentation

## 2023-05-16 ENCOUNTER — Encounter: Payer: Self-pay | Admitting: Gastroenterology

## 2023-06-05 DIAGNOSIS — Z419 Encounter for procedure for purposes other than remedying health state, unspecified: Secondary | ICD-10-CM | POA: Diagnosis not present

## 2023-06-13 DIAGNOSIS — F331 Major depressive disorder, recurrent, moderate: Secondary | ICD-10-CM | POA: Diagnosis not present

## 2023-06-13 DIAGNOSIS — F411 Generalized anxiety disorder: Secondary | ICD-10-CM | POA: Diagnosis not present

## 2023-06-19 NOTE — Therapy (Addendum)
OUTPATIENT PHYSICAL THERAPY EVALUATION/DISCHARGE  PHYSICAL THERAPY DISCHARGE SUMMARY  Visits from Start of Care: 1  Current functional level related to goals / functional outcomes: See goals/objective   Remaining deficits: Unable to assess   Education / Equipment: HEP   Patient agrees to discharge. Patient goals were unable to assess. Patient is being discharged due to not returning since the last visit.     Patient Name: [name] MRN: [id number] DOB:19-Jun-1981, 42 y.o., female Today's Date: 06/20/2023  END OF SESSION:  PT End of Session - 06/20/23 0814     Visit Number 1    Number of Visits 9    Date for PT Re-Evaluation 08/15/23    Authorization Type [place] [employer]    PT Start Time 0740    PT Stop Time 0810    PT Time Calculation (min) 30 min    Activity Tolerance Patient tolerated treatment well    Behavior During Therapy WFL for tasks assessed/performed             Past Medical History:  Diagnosis Date   Anxiety    Asthma    Back pain, chronic    Depression    hx of    Dysrhythmia 12/06/2011   hx of a-fib   Ectopic pregnancy    GERD (gastroesophageal reflux disease)    Headache    occasional    Hypertension    was told, but never on meds   Infection    UTI   Ovarian cyst    Urinary tract infection    Past Surgical History:  Procedure Laterality Date   CLEFT PALATE REPAIR     etopical surgery     LAPAROSCOPY FOR ECTOPIC PREGNANCY  2011   TYMPANOPLASTY Right 08/03/2015   Procedure: RIGHT MEDIAL TECHNIQUE TYMPANOPLASTY;  Surgeon: Flo Shanks, MD;  Location: [facility];  Service: ENT;  Laterality: Right;   VENTRAL HERNIA REPAIR N/A 03/26/2015   Procedure: LAPAROSCOPIC REPAIR OF INCARCERATED SUPERUMBILICAL HERNIA X2 AND UMBILICAL HERNIA REPAIR WITH MESH;  Surgeon: Karie Soda, MD;  Location: [facility];  Service: General;  Laterality: N/A;   Patient Active Problem List   Diagnosis Date Noted   Diarrhea 05/09/2023    Gastroesophageal reflux disease 02/28/2023   Change in bowel habits 02/28/2023   Chronic anticoagulation 02/28/2023   A-fib (HCC) 07/18/2022   Nausea and vomiting 07/17/2022   Hyperglycemia 07/17/2022   Epigastric abdominal pain 07/17/2022   Hiatal hernia 07/17/2022   Closed fracture of right ankle 04/13/2018   Ileus (HCC) 03/28/2015   Incarcerated ventral hernia s/p lap repair w mesh 03/25/2014 03/26/2015   Umbilical hernia s/p lap repair w mesh 03/25/2014 03/26/2015   Bipolar disorder, unspecified (HCC) 09/01/2014   Back muscle spasm 09/01/2014   Smoking 09/01/2014   Elevated BP 09/01/2014   PAF (paroxysmal atrial fibrillation) (HCC) 01/13/2013   Essential hypertension 01/13/2013    PCP: Charlane Ferretti, DO  REFERRING PROVIDER: Sheral Apley, MD  REFERRING DIAG: M54.50 (ICD-10-CM) - Lumbago   Rationale for Evaluation and Treatment: Rehabilitation  THERAPY DIAG:  Other low back pain  Muscle weakness (generalized)  ONSET DATE:    SUBJECTIVE:  SUBJECTIVE STATEMENT: Pt presents to PT with reports of chronic LBP. Denies overt N/T to LE, but did have instance of L LE cramping a few days ago. 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 Denies b/b changes or saddle anesthesia.   PERTINENT HISTORY:  HTN  PAIN:  Are you having pain?  Yes: NPRS scale: 8/10 Worst: 10/10 Pain location: lower back Pain description: tight, dull ache Aggravating factors: prolonged standing, twisting Relieving factors: none  PRECAUTIONS: None  RED FLAGS: None   WEIGHT BEARING RESTRICTIONS: No  FALLS:  Has patient fallen in last 6 months? No  LIVING ENVIRONMENT: Lives with: lives with their family Lives in:  House/apartment  OCCUPATION: [employer]   PLOF: Independent  PATIENT GOALS: decrease back pain to improve comfort with ADLs and work  OBJECTIVE:   DIAGNOSTIC FINDINGS:  See imaging  PATIENT SURVEYS:  FOTO: 52% function; 57% predicted  COGNITION: Overall cognitive status: Within functional limits for tasks assessed     SENSATION: WFL  MUSCLE LENGTH: Hamstrings: Right (-); Left (-) Thomas test: Right (+); Left (+)   POSTURE: increased lumbar lordosis  PALPATION: TTP to bilateral lumbar paraspinals  LUMBAR ROM:   AROM eval  Flexion   Extension   Right lateral flexion   Left lateral flexion   Right rotation   Left rotation    (Blank rows = not tested)  LOWER EXTREMITY MMT:    MMT Right eval Left eval  Hip flexion 3/5 3/5  Hip extension    Hip abduction 3/5 3/5  Hip adduction 3+/5 3+/5  Hip internal rotation    Hip external rotation    Knee flexion    Knee extension    Ankle dorsiflexion    Ankle plantarflexion    Ankle inversion    Ankle eversion     (Blank rows = not tested)  LUMBAR SPECIAL TESTS:  Straight leg raise test: Negative and Slump test: Negative  FUNCTIONAL TESTS:  30 Second Sit to Stand: 7 reps  GAIT: Distance walked: 45ft Assistive device utilized: None Level of assistance: Complete Independence Comments: no overt deviations  TREATMENT: [facility] Adult PT Treatment:                                                DATE: 06/20/2023 Therapeutic Exercise: Modified thomas stretch x 60"  Supine PPT x 5 - 5" hold LTR x 5  Supine clamshell x 10 GTB Bridge x 5  PATIENT EDUCATION:  Education details: eval findings, FOTO, HEP, POC Person educated: Patient Education method: Explanation, Demonstration, and Handouts Education comprehension: verbalized understanding and returned demonstration  HOME EXERCISE PROGRAM: Access Code: [email or web address] URL: [email or web address] Date: 06/20/2023 Prepared by: Edwinna Areola  Exercises - Modified Maisie Fus Stretch  - 1 x daily - 7 x weekly - 2 reps - 60 sec hold - Supine Posterior Pelvic Tilt  - 1 x daily - 7 x weekly - 2 sets - 10 reps - 5 sec hold - Supine Lower Trunk Rotation  - 1 x daily - 7 x weekly - 2 sets - 10 reps - 5 sec hold - Hooklying Clamshell with Resistance  - 1 x daily - 7 x weekly - 3 sets - 10 reps - green band hold - Supine Bridge  - 1 x  daily - 7 x weekly - 2 sets - 10 reps  ASSESSMENT:  CLINICAL IMPRESSION: Patient is a 42 y.o. F who was seen today for physical therapy evaluation and treatment for chronic LBP. Physical findings are consistent with MD impression as pt demonstrates decrease in core/proximal hip strength and painful lumbar ROM. Her FOTO score shows she is operating below subjective PLOF. She would benefit from skilled PT services working on improving core and proximal hip strength to decrease pain.    OBJECTIVE IMPAIRMENTS: decreased activity tolerance, decreased mobility, difficulty walking, decreased ROM, decreased strength, postural dysfunction, and pain.   ACTIVITY LIMITATIONS: lifting, bending, standing, squatting, and locomotion level  PARTICIPATION LIMITATIONS: shopping, community activity, occupation, and yard work  PERSONAL FACTORS: Time since onset of injury/illness/exacerbation and 1 comorbidity: HTN  are also affecting patient's functional outcome.   REHAB POTENTIAL: Excellent  CLINICAL DECISION MAKING: Stable/uncomplicated  EVALUATION COMPLEXITY: Low   GOALS: Goals reviewed with patient? No  SHORT TERM GOALS: Target date: 07/11/2023    Pt will be compliant and knowledgeable with initial HEP for improved comfort and carryover Baseline: initial HEP given  Goal status: INITIAL  2.  Pt will self report lower back pain no greater than 6/10 for improved comfort and functional ability Baseline: 10/10 at worst Goal status: INITIAL   LONG TERM GOALS: Target date: 08/15/2023    Pt will improve FOTO  function score to no less than 57% as proxy for functional improvement with home ADLs and work Baseline: 52% function Goal status: INITIAL   2.  Pt will self report lower back pain no greater than 3/10 for improved comfort and functional ability Baseline: 10/10 at worst Goal status: INITIAL   3.  Pt will increase 30 Second Sit to Stand rep count to no less than 10 reps for improved balance, strength, and functional mobility Baseline: 7 reps  Goal status: INITIAL   4.  Pt will improve bilateral hip MMT to no less than 4/5 for improved functional mobility and decreased pain Baseline: see MMT chart Goal status: INITIAL   PLAN:  PT FREQUENCY: 1x/week  PT DURATION: 8 weeks  PLANNED INTERVENTIONS: Therapeutic exercises, Therapeutic activity, Neuromuscular re-education, Balance training, Gait training, Patient/Family education, Self Care, Joint mobilization, Dry Needling, Electrical stimulation, Cryotherapy, Moist heat, Vasopneumatic device, Manual therapy, and Re-evaluation.  PLAN FOR NEXT SESSION: assess HEP response, neutral spine strengthening, hip strengthening   [employer] Authorization   Choose one: Rehabilitative  Standardized Assessment or Functional Outcome Tool: FOTO  Score or Percent Disability: 52% function; 57% predicted  Body Parts Treated (Select each separately):  Lumbopelvic. Overall deficits/functional limitations for body part selected: moderate N/A. Overall deficits/functional limitations for body part selected: N/A N/A. Overall deficits/functional limitations for body part selected: N/A   If treatment provided at initial evaluation, no treatment charged due to lack of authorization.     Eloy End, PT 06/20/2023, 8:14 AM

## 2023-06-20 ENCOUNTER — Other Ambulatory Visit: Payer: Self-pay

## 2023-06-20 ENCOUNTER — Ambulatory Visit: Payer: Medicaid Other | Attending: Orthopedic Surgery

## 2023-06-20 DIAGNOSIS — M6281 Muscle weakness (generalized): Secondary | ICD-10-CM | POA: Diagnosis not present

## 2023-06-20 DIAGNOSIS — M5459 Other low back pain: Secondary | ICD-10-CM | POA: Diagnosis not present

## 2023-06-26 ENCOUNTER — Ambulatory Visit: Payer: Medicaid Other

## 2023-07-03 ENCOUNTER — Ambulatory Visit: Payer: Medicaid Other

## 2023-07-06 DIAGNOSIS — Z419 Encounter for procedure for purposes other than remedying health state, unspecified: Secondary | ICD-10-CM | POA: Diagnosis not present

## 2023-07-12 DIAGNOSIS — I10 Essential (primary) hypertension: Secondary | ICD-10-CM | POA: Diagnosis not present

## 2023-07-12 DIAGNOSIS — E46 Unspecified protein-calorie malnutrition: Secondary | ICD-10-CM | POA: Diagnosis not present

## 2023-07-12 DIAGNOSIS — E781 Pure hyperglyceridemia: Secondary | ICD-10-CM | POA: Diagnosis not present

## 2023-07-12 LAB — LAB REPORT - SCANNED: EGFR: 132

## 2023-07-17 DIAGNOSIS — F5105 Insomnia due to other mental disorder: Secondary | ICD-10-CM | POA: Diagnosis not present

## 2023-07-17 DIAGNOSIS — F331 Major depressive disorder, recurrent, moderate: Secondary | ICD-10-CM | POA: Diagnosis not present

## 2023-07-17 DIAGNOSIS — F411 Generalized anxiety disorder: Secondary | ICD-10-CM | POA: Diagnosis not present

## 2023-07-19 DIAGNOSIS — M25569 Pain in unspecified knee: Secondary | ICD-10-CM | POA: Diagnosis not present

## 2023-07-19 DIAGNOSIS — I4891 Unspecified atrial fibrillation: Secondary | ICD-10-CM | POA: Diagnosis not present

## 2023-07-19 DIAGNOSIS — F319 Bipolar disorder, unspecified: Secondary | ICD-10-CM | POA: Diagnosis not present

## 2023-07-19 DIAGNOSIS — R12 Heartburn: Secondary | ICD-10-CM | POA: Diagnosis not present

## 2023-07-19 DIAGNOSIS — D6869 Other thrombophilia: Secondary | ICD-10-CM | POA: Diagnosis not present

## 2023-07-19 DIAGNOSIS — E781 Pure hyperglyceridemia: Secondary | ICD-10-CM | POA: Diagnosis not present

## 2023-07-19 DIAGNOSIS — Z1331 Encounter for screening for depression: Secondary | ICD-10-CM | POA: Diagnosis not present

## 2023-07-19 DIAGNOSIS — I10 Essential (primary) hypertension: Secondary | ICD-10-CM | POA: Diagnosis not present

## 2023-07-19 DIAGNOSIS — Z0000 Encounter for general adult medical examination without abnormal findings: Secondary | ICD-10-CM | POA: Diagnosis not present

## 2023-07-19 DIAGNOSIS — R053 Chronic cough: Secondary | ICD-10-CM | POA: Diagnosis not present

## 2023-08-02 ENCOUNTER — Ambulatory Visit (INDEPENDENT_AMBULATORY_CARE_PROVIDER_SITE_OTHER): Payer: Medicaid Other | Admitting: Gastroenterology

## 2023-08-02 ENCOUNTER — Encounter: Payer: Self-pay | Admitting: Gastroenterology

## 2023-08-02 VITALS — BP 110/80 | HR 70 | Ht 60.0 in | Wt 142.0 lb

## 2023-08-02 DIAGNOSIS — R112 Nausea with vomiting, unspecified: Secondary | ICD-10-CM | POA: Diagnosis not present

## 2023-08-02 DIAGNOSIS — R630 Anorexia: Secondary | ICD-10-CM | POA: Diagnosis not present

## 2023-08-02 MED ORDER — FAMOTIDINE 20 MG PO TABS: 20.0000 mg | ORAL_TABLET | Freq: Every day | ORAL | 3 refills | Status: DC

## 2023-08-02 MED ORDER — MIRTAZAPINE 15 MG PO TABS: 15.0000 mg | ORAL_TABLET | Freq: Every day | ORAL | 2 refills | Status: DC

## 2023-08-02 NOTE — Progress Notes (Addendum)
08/02/2023 MCKENLEY BIRENBAUM [id number] 1981/04/19   HISTORY OF PRESENT ILLNESS: This is a 42 year old female who is now a patient of Dr. Urban Gibson.  She was seen by me in March with complaints of severe acid reflux.  She was complaining of burning discomfort in her chest.  She was also complaining of diarrhea so at her last visit with me we scheduled EGD and colonoscopy.  Those studies are listed below.  She is here today for follow-up.  She tells me that she has continued to have a lot of nausea.  No vomiting, but has a significant loss in appetite.  She thinks that she is lost 3-4 pounds last week and overall looks like she might be down about 14 pounds recently.  She says that even the thoughts of food make her feel sick.  She can tolerate liquids just fine.  Even just eating a very small amount is nearly impossible.  She is on pantoprazole 40 mg daily.  EGD in May 05, 2023 with Dr. Chales Abrahams showed irregular Z-line and gastritis  Colonoscopy May 05, 2023 with Dr. Chales Abrahams showed a 10 mm polyp and a 12 mm polyp that were both removed and minimal diverticulosis as well as internal hemorrhoids.  1. Surgical [P], small bowel - BENIGN SMALL BOWEL MUCOSA WITH NO SIGNIFICANT PATHOLOGIC CHANGES 2. Surgical [P], gastric body and antrum - GASTRIC ANTRAL AND OXYNTIC MUCOSA WITH NO SPECIFIC PATHOLOGIC CHANGES - NEGATIVE FOR H. PYLORI ON H&E STAIN - NEGATIVE FOR INTESTINAL METAPLASIA OR MALIGNANCY 3. Surgical [P], distal esophagus - BENIGN SQUAMOUS MUCOSA WITH MILD REFLUX CHANGES - NEGATIVE FOR INTESTINAL METAPLASIA, DYSPLASIA OR MALIGNANCY 4. Surgical [P], esophageal - SQUAMOUS MUCOSA WITH NO SPECIFIC PATHOLOGIC CHANGES - NEGATIVE FOR INCREASED INTRAEPITHELIAL EOSINOPHILS - NEGATIVE FOR DYSPLASIA OR MALIGNANCY 5. Surgical [P], random colon - BENIGN COLONIC MUCOSA WITH NO SPECIFIC PATHOLOGIC CHANGES - NEGATIVE FOR INCREASED INTRAEPITHELIAL LYMPHOCYTES OR THICKENED SUBEPITHELIAL COLLAGEN TABLE -  NEGATIVE FOR DYSPLASIA OR MALIGNANCY 6. Surgical [P], colon, descending, polyp (1) - TUBULOVILLOUS ADENOMA - NO HIGH-GRADE DYSPLASIA OR MALIGNANCY 7. Surgical [P], colon, sigmoid, polyp (1) - TUBULAR ADENOMA. - NO HIGH GRADE DYSPLASIA OR MALIGNANCY.  Repeat colonoscopy recommended at 3-year interval.  Past Medical History:  Diagnosis Date   Anxiety    Asthma    Back pain, chronic    Depression    hx of    Dysrhythmia 12/06/2011   hx of a-fib   Ectopic pregnancy    GERD (gastroesophageal reflux disease)    Headache    occasional    Hypertension    was told, but never on meds   Infection    UTI   Ovarian cyst    Urinary tract infection    Past Surgical History:  Procedure Laterality Date   CLEFT PALATE REPAIR     etopical surgery     LAPAROSCOPY FOR ECTOPIC PREGNANCY  2011   TYMPANOPLASTY Right 08/03/2015   Procedure: RIGHT MEDIAL TECHNIQUE TYMPANOPLASTY;  Surgeon: Flo Shanks, MD;  Location: [facility];  Service: ENT;  Laterality: Right;   VENTRAL HERNIA REPAIR N/A 03/26/2015   Procedure: LAPAROSCOPIC REPAIR OF INCARCERATED SUPERUMBILICAL HERNIA X2 AND UMBILICAL HERNIA REPAIR WITH MESH;  Surgeon: Karie Soda, MD;  Location: [facility];  Service: General;  Laterality: N/A;    reports that she has been smoking cigarettes. She has a 3.8 pack-year smoking history. She has never used smokeless tobacco. She reports current alcohol use of about 1.0 standard drink of  alcohol per week. She reports that she does not use drugs. family history includes Cancer in her maternal grandfather and sister; Colon cancer in her maternal grandfather; Heart disease in her maternal grandmother; Hypertension in her father and mother. Allergies  Allergen Reactions   Flexeril [Cyclobenzaprine] Hives   Lamotrigine Hives      Outpatient Encounter Medications as of 08/02/2023  Medication Sig   cloNIDine (CATAPRES) 0.1 MG tablet Take 0.1 mg by mouth at bedtime.   diltiazem (CARDIZEM CD)  180 MG 24 hr capsule Take 1 capsule (180 mg total) by mouth daily.   famotidine (PEPCID) 20 MG tablet Take 20 mg by mouth daily as needed.   FLUoxetine (PROZAC) 20 MG capsule Take 20 mg by mouth daily.   montelukast (SINGULAIR) 10 MG tablet Take 10 mg by mouth daily.   rivaroxaban (XARELTO) 20 MG TABS tablet Take 1 tablet (20 mg total) by mouth daily with supper.   pantoprazole (PROTONIX) 40 MG tablet Take 1 tablet (40 mg total) by mouth daily.   [DISCONTINUED] hydrOXYzine (ATARAX) 25 MG tablet Take 25 mg by mouth every 6 (six) hours as needed for anxiety.   [DISCONTINUED] traZODone (DESYREL) 50 MG tablet Take 50 mg by mouth at bedtime.   No facility-administered encounter medications on file as of 08/02/2023.    REVIEW OF SYSTEMS  : All other systems reviewed and negative except where noted in the History of Present Illness.   PHYSICAL EXAM: BP 110/80   Pulse 70   Ht 5' (1.524 m)   Wt 142 lb (64.4 kg)   BMI 27.73 kg/m  General: Well developed female in no acute distress Head: Normocephalic and atraumatic Eyes:  Sclerae anicteric, conjunctiva pink. Ears: Normal auditory acuity Lungs: Clear throughout to auscultation; no W/R/R. Heart: Regular rate and rhythm; no M/R/G. Abdomen: Soft, non-distended.  BS present.  Mild diffuse TTP. Musculoskeletal: Symmetrical with no gross deformities  Skin: No lesions on visible extremities Extremities: No edema  Neurological: Alert oriented x 4, grossly non-focal Psychological:  Alert and cooperative. Normal mood and affect  ASSESSMENT AND PLAN: *GERD: EGD with irregular Z-line, but no Barrett's esophagus on biopsies.  She is on pantoprazole 40 mg daily.  Her main complaints now are of nausea, and poor appetite as well as associated weight loss.  I am going to add Pepcid 20 mg at bedtime.  Will also have her try mirtazapine 15 mg at bedtime to see if this helps with the nausea, stimulate appetite, etc.  Prescription sent to pharmacy.  Will plan for  gastric emptying scan to rule out gastroparesist.  She will follow-up with Dr. Chales Abrahams or myself in the next few months.  **Addendum: CBC, CMP, TSH from earlier this month (August 2024) all look good.  CC:  Charlane Ferretti, DO

## 2023-08-02 NOTE — Patient Instructions (Addendum)
We have sent the following medications to your pharmacy for you to pick up at your convenience: Remeron 15 mg nightly.  Pepcid 20 mg nightly.   You have been scheduled for a gastric emptying scan at [facility] on Friday 08/25/23 at 8:30 am. Please arrive at least 30 minutes prior to your appointment for registration. Please make certain not to have anything to eat or drink after midnight the night before your test. Hold all stomach medications (ex: Zofran, phenergan, Reglan) 24 hours prior to your test. If you need to reschedule your appointment, please contact radiology scheduling at [phone]. _____________________________________________________________________ A gastric-emptying study measures how long it takes for food to move through your stomach. There are several ways to measure stomach emptying. In the most common test, you eat food that contains a small amount of radioactive material. A scanner that detects the movement of the radioactive material is placed over your abdomen to monitor the rate at which food leaves your stomach. This test normally takes about 4 hours to complete. _____________________________________________________________________  _______________________________________________________  If your blood pressure at your visit was 140/90 or greater, please contact your primary care physician to follow up on this.  _______________________________________________________  If you are age 43 or older, your body mass index should be between 23-30. Your Body mass index is 27.73 kg/m. If this is out of the aforementioned range listed, please consider follow up with your Primary Care Provider.  If you are age 39 or younger, your body mass index should be between 19-25. Your Body mass index is 27.73 kg/m. If this is out of the aformentioned range listed, please consider follow up with your Primary Care Provider.    ________________________________________________________  [facility] providers would like to encourage you to use [facility] to communicate with providers for non-urgent requests or questions.  Due to long hold times on the telephone, sending your provider a message by [facility] may be a faster and more efficient way to get a response.  Please allow 48 business hours for a response.  Please remember that this is for non-urgent requests.  _______________________________________________________

## 2023-08-03 ENCOUNTER — Encounter: Payer: Self-pay | Admitting: Gastroenterology

## 2023-08-03 ENCOUNTER — Encounter: Payer: Self-pay | Admitting: Neurology

## 2023-08-03 ENCOUNTER — Ambulatory Visit: Payer: Medicaid Other | Admitting: Neurology

## 2023-08-03 VITALS — BP 136/82 | Ht 60.0 in | Wt 140.0 lb

## 2023-08-03 DIAGNOSIS — Z9189 Other specified personal risk factors, not elsewhere classified: Secondary | ICD-10-CM

## 2023-08-03 DIAGNOSIS — R630 Anorexia: Secondary | ICD-10-CM | POA: Insufficient documentation

## 2023-08-03 DIAGNOSIS — R519 Headache, unspecified: Secondary | ICD-10-CM | POA: Diagnosis not present

## 2023-08-03 DIAGNOSIS — G4719 Other hypersomnia: Secondary | ICD-10-CM

## 2023-08-03 DIAGNOSIS — I480 Paroxysmal atrial fibrillation: Secondary | ICD-10-CM | POA: Diagnosis not present

## 2023-08-03 DIAGNOSIS — R351 Nocturia: Secondary | ICD-10-CM

## 2023-08-03 DIAGNOSIS — G250 Essential tremor: Secondary | ICD-10-CM | POA: Diagnosis not present

## 2023-08-03 DIAGNOSIS — G479 Sleep disorder, unspecified: Secondary | ICD-10-CM | POA: Diagnosis not present

## 2023-08-03 DIAGNOSIS — R0683 Snoring: Secondary | ICD-10-CM

## 2023-08-03 NOTE — Patient Instructions (Signed)
You have a mild tremor of both hands, likely essential tremor.  I do not see any signs or symptoms of parkinson's like disease or what we call parkinsonism.  For your tremor, I would not recommend any new medications at this time.  You are not a good candidate for a beta-blocker such as propranolol because of your depression history as well as your asthma.  I would be reluctant to suggest primidone for tremor control as you are already on potentially sedating medications and primidone can cause sleepiness, GI side effects and balance issues.  As you are not sleeping well currently, and you may be at risk for obstructive sleep apnea given snoring and nighttime urination as well as morning headaches, I would like to suggest that we proceed with a home sleep test to look into the possibility of obstructive sleep apnea.  If you have obstructive sleep apnea I will likely recommend treatment with an AutoPap machine.  As discussed, we will proceed with a brain MRI with and without contrast to rule out a structural cause of your tremors.   For now, we will keep you posted as to your test results by phone call and plan to follow-up in this clinic accordingly.  Please remember, that any kind of tremor may be exacerbated by anxiety, anger, nervousness, excitement, dehydration, sleep deprivation, thyroid dysfunction, by caffeine, and low blood sugar values or blood sugar fluctuations. Some medications can exacerbate tremors, this includes certain asthma or COPD medications and certain antidepressants, including fluoxetine.

## 2023-08-03 NOTE — Progress Notes (Signed)
Subjective:    Patient ID: [name] is a 42 y.o. female.  HPI    Huston Foley, MD, PhD [facility] [address], [address] [address] [place], [place] [address]  Dear Dr. Thornell Mule,   I saw your patient, [name], upon your kind request and my neurologic clinic today for initial consultation of her hand tremors.  The patient is unaccompanied today.  As you know, [name] is a 42 year old female with an underlying medical history of paroxysmal A-fib, prior smoking, aortic regurgitation, asthma, reflux disease, chronic back pain hypertension, history of ovarian cysts, diabetes, depression and mildly overweight state, who reports a longstanding history of hand tremors essentially since she was a child.  She reports that her mom told her that her dad had a tremor, she has seen a tremor in his hands before.  She does not know much about his side of the family otherwise.  She does have half siblings on his dad side.   Tremor is embarrassing to her, triggers include nervousness and anxiety.  She has wondered if the Prozac is causing her tremors.  She has seen GI for nausea and appetite loss, she had an upper and lower endoscopy.  She reports that she is supposed to have a CT scan.  I do see in her chart that she is scheduled for a gastric emptying study.  I reviewed your office visit note from 07/19/2023.  Of note she is on Prozac 40 mg daily.  This was increased by her psychiatrist in or around May 2024.  She reports that she no longer takes Seroquel or hydroxyzine, reports that she has not taken these medicines in about a year, she no longer takes trazodone.  She has been on Ambien 10 mg nightly per psychiatry.  Her GI doctor recently started her on mirtazapine for her nausea and appetite loss.   She does not drink any alcohol, reports that she has not had any alcohol since she was started on diltiazem.  She does not drink any caffeine.  She quit smoking  in May 2024.  She tries to hydrate well with water.  She reports that she was advised to drink less water as she was drinking too much water.  She does not sleep well.  Per husband, she snores, she has nocturia about twice per average night and has had occasional morning headaches.   Her Epworth sleepiness score is 12 out of 24, fatigue severity score is 42 out of 63. She has had some right-sided sciatica type symptoms.   She has not tried any medications for tremor control.  She has been on diltiazem per cardiology.  She is also on Xarelto.  Full medication list as below.  I have reviewed blood work which was done through your office on 07/12/2023.  CMP showed glucose of 77, creatinine 0.6, sodium 136, potassium 4.9, AST 21, ALT 20, alk phos 59, BUN 6.  CBC with differential and platelets benign, hemoglobin was 13.4 hematocrit 42.8, lipid panel showed total cholesterol 128, triglycerides 47, HDL 56, LDL 63, TSH in the normal range at 2.56.  Her Past Medical History Is Significant For: Past Medical History:  Diagnosis Date   Anxiety    Asthma    Back pain, chronic    Depression    hx of    Dysrhythmia 12/06/2011   hx of a-fib   Ectopic pregnancy    GERD (gastroesophageal reflux disease)    Headache    occasional  Hypertension    was told, but never on meds   Infection    UTI   Ovarian cyst    Urinary tract infection     Her Past Surgical History Is Significant For: Past Surgical History:  Procedure Laterality Date   CLEFT PALATE REPAIR     etopical surgery     LAPAROSCOPY FOR ECTOPIC PREGNANCY  2011   TYMPANOPLASTY Right 08/03/2015   Procedure: RIGHT MEDIAL TECHNIQUE TYMPANOPLASTY;  Surgeon: Flo Shanks, MD;  Location: [facility];  Service: ENT;  Laterality: Right;   VENTRAL HERNIA REPAIR N/A 03/26/2015   Procedure: LAPAROSCOPIC REPAIR OF INCARCERATED SUPERUMBILICAL HERNIA X2 AND UMBILICAL HERNIA REPAIR WITH MESH;  Surgeon: Karie Soda, MD;  Location: [facility];   Service: General;  Laterality: N/A;    Her Family History Is Significant For: Family History  Problem Relation Age of Onset   Hypertension Mother    Hypertension Father    Cancer Sister    Heart disease Maternal Grandmother    Cancer Maternal Grandfather    Colon cancer Maternal Grandfather    Other Neg Hx    Hearing loss Neg Hx    Rectal cancer Neg Hx    Stomach cancer Neg Hx    Esophageal cancer Neg Hx     Her Social History Is Significant For: Social History   Socioeconomic History   Marital status: Single    Spouse name: Not on file   Number of children: Not on file   Years of education: Not on file   Highest education level: Not on file  Occupational History   Not on file  Tobacco Use   Smoking status: Former    Current packs/day: 0.25    Average packs/day: 0.3 packs/day for 15.0 years (3.8 ttl pk-yrs)    Types: Cigarettes   Smokeless tobacco: Never  Vaping Use   Vaping status: Never Used  Substance and Sexual Activity   Alcohol use: Not Currently    Alcohol/week: 1.0 standard drink of alcohol    Types: 1 Standard drinks or equivalent per week    Comment: social   Drug use: No   Sexual activity: Yes    Partners: Male    Birth control/protection: None  Other Topics Concern   Not on file  Social History Narrative   Right handed   Caffeine-none   Lives with fiance   Social Determinants of Health   Financial Resource Strain: Not on file  Food Insecurity: Not on file  Transportation Needs: Not on file  Physical Activity: Not on file  Stress: Not on file  Social Connections: Not on file    Her Allergies Are:  Allergies  Allergen Reactions   Flexeril [Cyclobenzaprine] Hives   Lamotrigine Hives  :   Her Current Medications Are:  Outpatient Encounter Medications as of 08/03/2023  Medication Sig   cloNIDine (CATAPRES) 0.1 MG tablet Take 0.1 mg by mouth at bedtime.   diltiazem (CARDIZEM CD) 180 MG 24 hr capsule Take 1 capsule (180 mg total) by mouth  daily.   famotidine (PEPCID) 20 MG tablet Take 1 tablet (20 mg total) by mouth at bedtime.   FLUoxetine (PROZAC) 20 MG capsule Take 40 mg by mouth daily.   mirtazapine (REMERON) 15 MG tablet Take 1 tablet (15 mg total) by mouth at bedtime.   montelukast (SINGULAIR) 10 MG tablet Take 10 mg by mouth daily.   rivaroxaban (XARELTO) 20 MG TABS tablet Take 1 tablet (20 mg total) by mouth daily  with supper.   pantoprazole (PROTONIX) 40 MG tablet Take 1 tablet (40 mg total) by mouth daily.   No facility-administered encounter medications on file as of 08/03/2023.  :   Review of Systems:  Out of a complete 14 point review of systems, all are reviewed and negative with the exception of these symptoms as listed below:  Review of Systems  Neurological:        Rm 5 alone, here for bilateral hand tremors    Objective:  Neurological Exam  Physical Exam Physical Examination:   Vitals:   08/03/23 0826  BP: 136/82    General Examination: The patient is a very pleasant 42 y.o. female in no acute distress. She appears well-developed and well-nourished and well groomed.   HEENT: Normocephalic, atraumatic, pupils are equal, round and reactive to light, corrective glasses in place.  Extraocular tracking is good without limitation to gaze excursion or nystagmus noted. Hearing is grossly intact. Face is symmetric with normal facial animation. Speech is clear with no dysarthria noted. There is no hypophonia. There is no lip, neck/head, jaw or voice tremor. Neck is supple with full range of passive and active motion. There are no carotid bruits on auscultation. Oropharynx exam reveals: moderate mouth dryness, adequate dental hygiene and mild airway crowding, due to redundant soft palate, scarring noted, status post uvulectomy?,  Status post tonsillectomy? Slightly asymmetrical palatal elevation.  Tongue protrudes centrally and normal tongue movements noted.  Moderate to significant overbite noted.  Chest:  Clear to auscultation without wheezing, rhonchi or crackles noted.  Heart: S1+S2+0, regular and normal without murmurs, rubs or gallops noted.   Abdomen: Soft, non-tender and non-distended.  Extremities: There is no pitting edema in the distal lower extremities bilaterally.   Skin: Warm and dry without trophic changes noted.   Musculoskeletal: exam reveals no obvious joint deformities.   Neurologically:  Mental status: The patient is awake, alert and oriented in all 4 spheres. Her immediate and remote memory, attention, language skills and fund of knowledge are appropriate. There is no evidence of aphasia, agnosia, apraxia or anomia. Speech is clear with normal prosody and enunciation. Thought process is linear. Mood is normal and affect is normal.  Cranial nerves II - XII are as described above under HEENT exam.  Motor exam: Normal bulk, strength and tone is noted. There is no obvious action or resting tremor.   There is no resting tremor. There is a bilateral upper extremity postural and action tremor, which is on the mild side, no intention tremor.  Tremor frequency is fairly fast and the amplitude is small.   On 08/03/2023: On Archimedes spiral drawing there is minimal to mild trembling with the right hand which is her dominant hand, mild to moderate trembling with the left hand, handwriting with printed sentence is very neat, not tremulous, not micrographic but on the smaller side.   Fine motor skills and coordination: Intact finger taps, hand movements and rapid alternating patting in both upper extremities, normal foot taps bilaterally, no decrement in amplitude, no lateralization noted.   Cerebellar testing: No dysmetria or intention tremor. There is no truncal or gait ataxia.  Normal finger-to-nose, normal heel-to-shin bilaterally. Sensory exam: intact to light touch in the upper and lower extremities.  Gait, station and balance: She stands easily. No veering to one side is noted. No  leaning to one side is noted. Posture is age-appropriate and stance is narrow based. Gait shows normal stride length and normal pace. No problems turning are  noted.   Assessment and plan:  In summary, Bryelle O Langhorne is a very pleasant 42 y.o.-year old female with an underlying medical history of paroxysmal A-fib, prior smoking, aortic regurgitation, asthma, reflux disease, chronic back pain hypertension, history of ovarian cysts, diabetes, depression and mildly overweight state, who presents for evaluation of her longstanding history of hand tremors since she was approximately 42 years old by her recollection.  She recalls that her dad has a tremor.  History and examination are supportive of essential tremor, findings on the mild side currently can fluctuate.  We talked about essential tremor, its nature, and triggers as well as exacerbating factors such as medication effect.  Prozac can exacerbate tremors, she did have an increase in the dose a few months back.  She is no longer on Seroquel.  She also is advised that sleep deprivation or poor sleep quality can exacerbate tremors.  She may be at risk for obstructive sleep apnea given her snoring, nocturia and occasional morning headaches as well as a prior diagnosis of paroxysmal A-fib.  We mutually agreed to proceed with testing through our office namely of brain MRI with and without contrast to rule out a structural cause of her tremors, as well as a home sleep test to look into the possibility of underlying obstructive sleep apnea.  We talked about obstructive sleep apnea as well today.  She is advised that if she has obstructive sleep apnea I will likely recommend treatment with an AutoPap machine.  She would be willing to consider this.  She is reassured that ther.  Medication changes, she was started on mirtazapine and Ambien recently, both can be sedating and primidone can also be sedating and in fact cause GI side effects.  She has recently seen GI  for nausea and appetite loss.  For now, we will keep her posted as to her test results by phone call and plan to follow-up in this clinic accordingly.  She is agreeable to monitoring her symptoms. This was an extended visit of over 60 minutes with multiple problems addressed, record review, significant counseling and coordination of care. Thank you very much for allowing me to participate in the care of this nice patient. If I can be of any further assistance to you please do not hesitate to call me at [phone].  Sincerely,   Huston Foley, MD, PhD

## 2023-08-06 DIAGNOSIS — Z419 Encounter for procedure for purposes other than remedying health state, unspecified: Secondary | ICD-10-CM | POA: Diagnosis not present

## 2023-08-08 ENCOUNTER — Ambulatory Visit: Payer: Medicaid Other

## 2023-08-10 ENCOUNTER — Telehealth: Payer: Self-pay | Admitting: Neurology

## 2023-08-10 NOTE — Telephone Encounter (Signed)
HST- [facility] [employer]kley Harvey: [id number] (exp. 08/08/23 to 11/06/23)   Patient is scheduled at [facility] for 08/22/23 at 8:30 AM  Mailed packet to the patient.

## 2023-08-10 NOTE — Telephone Encounter (Signed)
[employer] Berkley Harvey: [id number] exp. 08/08/23-10/07/23 sent to GI [phone]

## 2023-08-12 NOTE — Progress Notes (Signed)
Agree with assessment/plan.  Raj Gupta, MD [facility] [phone]  

## 2023-08-15 ENCOUNTER — Ambulatory Visit: Payer: Medicaid Other

## 2023-08-22 ENCOUNTER — Ambulatory Visit: Payer: Medicaid Other | Admitting: Neurology

## 2023-08-22 DIAGNOSIS — R0683 Snoring: Secondary | ICD-10-CM

## 2023-08-22 DIAGNOSIS — G4733 Obstructive sleep apnea (adult) (pediatric): Secondary | ICD-10-CM | POA: Diagnosis not present

## 2023-08-22 DIAGNOSIS — G4734 Idiopathic sleep related nonobstructive alveolar hypoventilation: Secondary | ICD-10-CM

## 2023-08-22 DIAGNOSIS — G250 Essential tremor: Secondary | ICD-10-CM

## 2023-08-22 DIAGNOSIS — G4719 Other hypersomnia: Secondary | ICD-10-CM

## 2023-08-22 DIAGNOSIS — G479 Sleep disorder, unspecified: Secondary | ICD-10-CM

## 2023-08-22 DIAGNOSIS — I480 Paroxysmal atrial fibrillation: Secondary | ICD-10-CM

## 2023-08-22 DIAGNOSIS — Z9189 Other specified personal risk factors, not elsewhere classified: Secondary | ICD-10-CM

## 2023-08-22 DIAGNOSIS — R519 Headache, unspecified: Secondary | ICD-10-CM

## 2023-08-22 DIAGNOSIS — R351 Nocturia: Secondary | ICD-10-CM

## 2023-08-23 ENCOUNTER — Ambulatory Visit (INDEPENDENT_AMBULATORY_CARE_PROVIDER_SITE_OTHER): Payer: Medicaid Other | Admitting: Obstetrics and Gynecology

## 2023-08-23 ENCOUNTER — Encounter: Payer: Self-pay | Admitting: Obstetrics and Gynecology

## 2023-08-23 ENCOUNTER — Other Ambulatory Visit (HOSPITAL_COMMUNITY)
Admission: RE | Admit: 2023-08-23 | Discharge: 2023-08-23 | Disposition: A | Discharge status: Home / Self Care | Payer: Medicaid Other | Source: Ambulatory Visit | Attending: Obstetrics and Gynecology | Admitting: Obstetrics and Gynecology

## 2023-08-23 VITALS — BP 129/85 | HR 78 | Wt 146.0 lb

## 2023-08-23 DIAGNOSIS — Z01419 Encounter for gynecological examination (general) (routine) without abnormal findings: Secondary | ICD-10-CM | POA: Insufficient documentation

## 2023-08-23 DIAGNOSIS — N951 Menopausal and female climacteric states: Secondary | ICD-10-CM

## 2023-08-23 DIAGNOSIS — E2839 Other primary ovarian failure: Secondary | ICD-10-CM

## 2023-08-23 DIAGNOSIS — N941 Unspecified dyspareunia: Secondary | ICD-10-CM

## 2023-08-23 MED ORDER — GABAPENTIN 100 MG PO CAPS
100.0000 mg | ORAL_CAPSULE | Freq: Every day | ORAL | 2 refills | Status: DC
Start: 2023-08-23 — End: 2023-11-30

## 2023-08-23 NOTE — Progress Notes (Addendum)
ANNUAL EXAM Patient name: [name] MRN [id number]  Date of birth: 1981/05/06 Chief Complaint:   Gynecologic Exam  History of Present Illness:   [name] is a 42 y.o. G2P0020 being seen today for a routine annual exam.  Current complaints: annual, menopause  Menstrual concerns? No  was on HRT previously, stopped menses at 31. Continues to have hot flashes. Was on it for ashort, not even a year On clonidine for sleep/anxiety Not on prozac currently due to having tremors and told they may be makign them worse  Vaginal dryness present occasionally   Breast or nipple changes? No  Contraception use? Yes menopause  Sexually active? Yes occasional pain with intercourse, new onset not due to dryness. Only hurts during intercourse, can be with both entry and deep penetration. No tearing sensation, feels like he is "going too far", has been same partner x 5 years   No LMP recorded. Patient is perimenopausal.   The pregnancy intention screening data noted above was reviewed. Potential methods of contraception were discussed. The patient elected to proceed with No data recorded.   Last pap No results found for: "DIAGPAP", "HPVHIGH", "ADEQPAP"   H/O abnormal pap: no Last mammogram: none prior.  Last colonoscopy: 04/2023.   FH cancer:  Grandfather (maternal) - colon  Sister - brain      07/17/2018    8:15 AM 02/23/2018   10:24 AM 12/22/2017    1:27 PM 08/03/2016   11:40 AM 03/08/2016   10:50 AM  Depression screen PHQ 2/9  Decreased Interest 0 0 0 0 0  Down, Depressed, Hopeless 0 0 0 0 0  PHQ - 2 Score 0 0 0 0 0         No data to display           Review of Systems:   Pertinent items are noted in HPI Denies any headaches, blurred vision, fatigue, shortness of breath, chest pain, abdominal pain, abnormal vaginal discharge/itching/odor/irritation, problems with periods, bowel movements, urination, or intercourse unless otherwise stated above. Pertinent  History Reviewed:  Reviewed past medical,surgical, social and family history.  Reviewed problem list, medications and allergies. Physical Assessment:   Vitals:   08/23/23 1034  BP: (!) 140/104  Pulse: 99  Weight: 146 lb (66.2 kg)  Body mass index is 28.51 kg/m.        Physical Examination:   General appearance - well appearing, and in no distress  Mental status - alert, oriented to person, place, and time  Psych:  She has a normal mood and affect  Skin - warm and dry, normal color, no suspicious lesions noted  Chest - effort normal, all lung fields clear to auscultation bilaterally  Heart - normal rate and regular rhythm  Breasts - breasts appear normal, no suspicious masses, no skin or nipple changes or  axillary nodes  Abdomen - soft, nontender, nondistended, no masses or organomegaly  Pelvic -  VULVA: normal appearing vulva with no masses, tenderness or lesions   VAGINA: normal appearing vagina with normal color and discharge, no lesions   CERVIX: normal appearing cervix without discharge or lesions, no CMT  Thin prep pap is done with HR HPV cotesting  UTERUS: uterus is felt to be normal size, shape, consistency and nontender   ADNEXA: No adnexal masses or tenderness noted. Normal vulvar sensation bilaterally Allodynia at introitus: No  Anal wink present Posterior vaginal wall nontender Right levator ani 1/10 Right ischiococcygeous 1/10 Right obturator internus 1/10  Left levator ani 4/10 Left ischioccocygeous 1/10 Left obturator internus 1/10 Anterior vaginal wall nontender Uterus nontender   Extremities:  No swelling or varicosities noted  Chaperone present for exam  No results found for this or any previous visit (from the past 24 hour(s)).    Assessment & Plan:  1. Well woman exam with routine gynecological exam - Cervical cancer screening: Discussed guidelines. Pap with HPV collected - Gardasil:  aged out - STD Testing: accepts - Birth Control: Discussed  options and their risks, benefits and common side effects; discussed VTE with estrogen containing options. Desires:  POF/menopause - Breast Health: Encouraged self breast awareness/SBE. Teaching provided. Discussed limits of clinical breast exam for detecting breast cancer. Rx given for MXR - F/U 12 months and prn - MM 3D SCREENING MAMMOGRAM BILATERAL BREAST; Future  2. Premature ovarian failure Amenorrhea x10 years, DEXA ordered due to increased risk of early osteoporosis, especially in the absence of HRT use.  - DG Bone Density; Future  3. Vasomotor symptoms due to menopause Trial of low dose gabapentin for VSM. Currently on clonidine for anxiety/sleep and possible side effect to SSRI.  Noted that gabapetin can cause drowsiness, therefore may help with sleep as well. Can titrate as needed, follow up response in 4-6 weeks. No urinary symptoms. Minimal vaginal dryness. If no response, can trial veozah, will just need LFTs prior to initiation. Avoid HRT given hx of A.Fib and increased CVD risk.  - gabapentin (NEURONTIN) 100 MG capsule; Take 1 capsule (100 mg total) by mouth at bedtime.  Dispense: 30 capsule; Refill: 2  4. Dyspareunia, female Pelvic myalgia noted on exam, no evidence or history of vaginal dryness. Referral to PFPT for dyspareunia. Noted that if vaginal dryness is an issue, vaginal estrogen therapy safe with history of afib.   Orders Placed This Encounter  Procedures   MM 3D SCREENING MAMMOGRAM BILATERAL BREAST   DG Bone Density    Meds:  Meds ordered this encounter  Medications   gabapentin (NEURONTIN) 100 MG capsule    Sig: Take 1 capsule (100 mg total) by mouth at bedtime.    Dispense:  30 capsule    Refill:  2    Follow-up: Return for GYN Follow Up.  Lorriane Shire, MD 08/23/2023 10:44 AM

## 2023-08-23 NOTE — Progress Notes (Signed)
See procedure note.

## 2023-08-25 ENCOUNTER — Encounter (HOSPITAL_COMMUNITY)
Admission: RE | Admit: 2023-08-25 | Discharge: 2023-08-25 | Disposition: A | Discharge status: Home / Self Care | Payer: Medicaid Other | Source: Ambulatory Visit | Attending: Gastroenterology | Admitting: Gastroenterology

## 2023-08-25 ENCOUNTER — Encounter (HOSPITAL_COMMUNITY): Payer: Self-pay | Admitting: Radiology

## 2023-08-25 DIAGNOSIS — R112 Nausea with vomiting, unspecified: Secondary | ICD-10-CM | POA: Insufficient documentation

## 2023-08-25 DIAGNOSIS — R1110 Vomiting, unspecified: Secondary | ICD-10-CM | POA: Diagnosis not present

## 2023-08-25 DIAGNOSIS — R630 Anorexia: Secondary | ICD-10-CM | POA: Diagnosis not present

## 2023-08-25 DIAGNOSIS — R6881 Early satiety: Secondary | ICD-10-CM | POA: Diagnosis not present

## 2023-08-25 MED ORDER — TECHNETIUM TC 99M SULFUR COLLOID
2.2000 | Freq: Once | INTRAVENOUS | Status: AC
  Administered 2023-08-25: 2.2 via INTRAVENOUS

## 2023-08-29 ENCOUNTER — Other Ambulatory Visit: Payer: Self-pay | Admitting: Obstetrics and Gynecology

## 2023-08-29 DIAGNOSIS — A5901 Trichomonal vulvovaginitis: Secondary | ICD-10-CM

## 2023-08-29 LAB — CYTOLOGY - PAP
Chlamydia: NEGATIVE
Comment: NEGATIVE
Comment: NEGATIVE
Comment: NEGATIVE
Comment: NORMAL
Diagnosis: NEGATIVE
High risk HPV: NEGATIVE
Neisseria Gonorrhea: NEGATIVE
Trichomonas: POSITIVE — AB

## 2023-08-29 MED ORDER — METRONIDAZOLE 500 MG PO TABS
ORAL_TABLET | ORAL | 0 refills | Status: DC
Start: 2023-08-29 — End: 2023-10-05

## 2023-08-29 NOTE — Procedures (Signed)
[facility]  HOME SLEEP TEST (Watch PAT) REPORT  STUDY DATE: 08/22/23  DOB: 12/30/1980  MRN: [id number]  ORDERING CLINICIAN: Huston Foley, MD, PhD   REFERRING CLINICIAN: Charlane Ferretti, DO   CLINICAL INFORMATION/HISTORY: 42 year old female with an underlying medical history of paroxysmal A-fib, prior smoking, aortic regurgitation, asthma, reflux disease, chronic back pain hypertension, history of ovarian cysts, diabetes, depression and mildly overweight state, who reports hand tremors, snoring, nocturia and morning headaches.   Epworth sleepiness score: 12/24.  BMI: 27.7 kg/m  FINDINGS:   Sleep Summary:   Total Recording Time (hours, min): 10 hours, 45 min  Total Sleep Time (hours, min):  8 hours, 4 min  Percent REM (%):    30.2%   Respiratory Indices:   Calculated pAHI (per hour):  26.7/hour         REM pAHI:    42.7/hour       NREM pAHI: 21.8/hour  Central pAHI: 0.4/hour  Oxygen Saturation Statistics:    Oxygen Saturation (%) Mean: 92%   Minimum oxygen saturation (%):                 69%   O2 Saturation Range (%): 69 - 99%    O2 Saturation (minutes) <=88%: 27.5 min  Pulse Rate Statistics:   Pulse Mean (bpm):    71/min    Pulse Range (52 - 135/min)   IMPRESSION: OSA (obstructive sleep apnea), moderate Nocturnal Hypoxemia  RECOMMENDATION:  This home sleep test demonstrates moderate obstructive sleep apnea with a total AHI of 26.7/hour and O2 nadir of 69% with significant time below or at 88% saturation of over 25 minutes, indicating nocturnal hypoxemia.  However, there were possible errors with her oxygen sensor and dislodged sensor several times during wakefulness.  Snoring was detected, ranging from mild to louder. Treatment with a positive airway pressure (PAP) device is recommended. The patient will be advised to proceed with an autoPAP titration/trial at home for now. A full night titration study may be considered to optimize  treatment settings, monitor proper oxygen saturations and aid with improvement of tolerance and adherence, if needed down the road. Alternative treatment options may include a dental device through dentistry or orthodontics in selected patients or Inspire (hypoglossal nerve stimulator) in carefully selected patients (meeting inclusion criteria).  Concomitant weight loss is recommended (where clinically appropriate). Please note that untreated obstructive sleep apnea may carry additional perioperative morbidity. Patients with significant obstructive sleep apnea should receive perioperative PAP therapy and the surgeons and particularly the anesthesiologist should be informed of the diagnosis and the severity of the sleep disordered breathing. The patient should be cautioned not to drive, work at heights, or operate dangerous or heavy equipment when tired or sleepy. Review and reiteration of good sleep hygiene measures should be pursued with any patient. Other causes of the patient's symptoms, including circadian rhythm disturbances, an underlying mood disorder, medication effect and/or an underlying medical problem cannot be ruled out based on this test. Clinical correlation is recommended.  The patient and her referring provider will be notified of the test results. The patient will be seen in follow up in sleep clinic at [facility].  I certify that I have reviewed the raw data recording prior to the issuance of this report in accordance with the standards of the American Academy of Sleep Medicine (AASM).    INTERPRETING PHYSICIAN:   Huston Foley, MD, PhD Medical Director, [facility]) Diplomat, ABPN (Neurology and Sleep)  [facility] [address] [place], [place] [address] [phone]

## 2023-08-29 NOTE — Addendum Note (Signed)
Addended by: Huston Foley on: 08/29/2023 05:45 PM   Modules accepted: Orders

## 2023-08-31 ENCOUNTER — Telehealth: Payer: Self-pay | Admitting: *Deleted

## 2023-08-31 NOTE — Telephone Encounter (Signed)
I called and LMVM for pt on google assist to call back.

## 2023-08-31 NOTE — Telephone Encounter (Signed)
-----   Message from Huston Foley sent at 08/29/2023  5:45 PM EDT ----- Patient referred by PCP, seen by me on 08/03/2023, patient had a HST on 08/22/2023.    Please call and notify the patient that the recent home sleep test showed obstructive sleep apnea in the moderate range. I recommend treatment in the form of autoPAP, which means, that we don't have to bring her in for a sleep study with CPAP, but will let her start using a so called autoPAP machine at home, which is a CPAP-like machine with self-adjusting pressures. We will send the order to a local DME company (of her choice, or as per insurance requirement). The DME representative will fit her with a mask, educate her on how to use the machine, how to put the mask on, etc. I have placed an order in the chart. Please send the order, talk to patient, send report to referring MD. We will need a FU in sleep clinic for 10 weeks post-PAP set up, please arrange that with me or one of our NPs. Also reinforce the need for compliance with treatment. Thanks,   Huston Foley, MD, PhD [facility])

## 2023-08-31 NOTE — Telephone Encounter (Signed)
Pt returned call.  I gave her the sleep study results. She verbalized understanding.  DME [facility].  Appt made VV [phone] at 1445.  Order send to [facility].

## 2023-09-04 NOTE — Telephone Encounter (Signed)
RE: new autopap user Received: 3 days ago Zott, Hennie Duos, RN; Melvern Sample Got It Thank You     Previous Messages    ----- Message ----- From: Guy Begin, RN Sent: 08/31/2023   5:05 PM EDT To: Marlou Porch Zott Subject: new autopap user                              New autopap user See order in EPIC  [name] Female, 42 y.o., 09-Dec-1980 MRN: [id number] Phone: [phone]   Andrey Campanile

## 2023-09-05 DIAGNOSIS — Z419 Encounter for procedure for purposes other than remedying health state, unspecified: Secondary | ICD-10-CM | POA: Diagnosis not present

## 2023-09-06 ENCOUNTER — Telehealth: Payer: Self-pay | Admitting: Neurology

## 2023-09-06 MED ORDER — ALPRAZOLAM 0.5 MG PO TABS: ORAL_TABLET | ORAL | 0 refills | Status: DC

## 2023-09-06 NOTE — Telephone Encounter (Signed)
Has MRI scheduled 09-13-2023.. no allergies to xanax.

## 2023-09-06 NOTE — Telephone Encounter (Signed)
Pt called wanting to know if a calming med can be called in for her when she gets her MRI done and have it sent to the [employer].

## 2023-09-06 NOTE — Telephone Encounter (Signed)
I called pt & discussed Xanax prescription dosage and instructions. I advised pt to be sure she has a driver to and from MRI due to side effects of Xanax (drowsiness, etc). She verbalized understanding and appreciation.

## 2023-09-06 NOTE — Telephone Encounter (Signed)
I have ordered Xanax for patient's upcoming MRI due to anxiety/claustrophobia reported. Please inform patient or caregiver and remind them, that she should not drive after taking Xanax and have someone take her for the MRI appointment.

## 2023-09-07 ENCOUNTER — Ambulatory Visit
Admission: RE | Admit: 2023-09-07 | Discharge: 2023-09-07 | Disposition: A | Discharge status: Home / Self Care | Payer: Medicaid Other | Source: Ambulatory Visit | Attending: Obstetrics and Gynecology | Admitting: Obstetrics and Gynecology

## 2023-09-07 DIAGNOSIS — Z01419 Encounter for gynecological examination (general) (routine) without abnormal findings: Secondary | ICD-10-CM

## 2023-09-12 DIAGNOSIS — F411 Generalized anxiety disorder: Secondary | ICD-10-CM | POA: Diagnosis not present

## 2023-09-12 DIAGNOSIS — F331 Major depressive disorder, recurrent, moderate: Secondary | ICD-10-CM | POA: Diagnosis not present

## 2023-09-13 ENCOUNTER — Ambulatory Visit
Admission: RE | Admit: 2023-09-13 | Discharge: 2023-09-13 | Disposition: A | Discharge status: Home / Self Care | Payer: Medicaid Other | Source: Ambulatory Visit | Attending: Neurology | Admitting: Neurology

## 2023-09-13 ENCOUNTER — Telehealth: Payer: Self-pay | Admitting: Neurology

## 2023-09-13 DIAGNOSIS — Z9189 Other specified personal risk factors, not elsewhere classified: Secondary | ICD-10-CM | POA: Diagnosis not present

## 2023-09-13 DIAGNOSIS — G4719 Other hypersomnia: Secondary | ICD-10-CM

## 2023-09-13 DIAGNOSIS — G479 Sleep disorder, unspecified: Secondary | ICD-10-CM | POA: Diagnosis not present

## 2023-09-13 DIAGNOSIS — R0683 Snoring: Secondary | ICD-10-CM | POA: Diagnosis not present

## 2023-09-13 DIAGNOSIS — G250 Essential tremor: Secondary | ICD-10-CM | POA: Diagnosis not present

## 2023-09-13 DIAGNOSIS — R519 Headache, unspecified: Secondary | ICD-10-CM

## 2023-09-13 DIAGNOSIS — I480 Paroxysmal atrial fibrillation: Secondary | ICD-10-CM

## 2023-09-13 DIAGNOSIS — R351 Nocturia: Secondary | ICD-10-CM | POA: Diagnosis not present

## 2023-09-13 DIAGNOSIS — I779 Disorder of arteries and arterioles, unspecified: Secondary | ICD-10-CM

## 2023-09-13 DIAGNOSIS — R9089 Other abnormal findings on diagnostic imaging of central nervous system: Secondary | ICD-10-CM

## 2023-09-13 MED ORDER — GADOPICLENOL 0.5 MMOL/ML IV SOLN
8.0000 mL | Freq: Once | INTRAVENOUS | Status: AC | PRN
  Administered 2023-09-13: 8 mL via INTRAVENOUS

## 2023-09-13 NOTE — Telephone Encounter (Signed)
Please call patient regarding her recent brain MRI.  Her brain MRI findings and contrast uptake were normal.  However, on the left side, the main artery called carotid artery was not displayed as well or did not show up as expected with uniform contrast uptake.  To clarify this finding and evaluate this further, it was recommended to do a special MRI of the head and neck focusing mostly on the blood vessels.  I have ordered a MR angiogram of the head and neck for her.  We will have to get insurance approval for these additional tests.  We will keep her posted as to the results by phone call.

## 2023-09-14 NOTE — Telephone Encounter (Signed)
I called pt and relayed the results of the MRI brain,  relayed that the LCA was not as clear with contrast so wants to evaluate with MRA head and neck.  Once done will call with results.  She appreciated call back and had no questions.

## 2023-09-15 DIAGNOSIS — G4733 Obstructive sleep apnea (adult) (pediatric): Secondary | ICD-10-CM | POA: Diagnosis not present

## 2023-09-26 ENCOUNTER — Telehealth: Payer: Self-pay | Admitting: Neurology

## 2023-09-26 NOTE — Telephone Encounter (Signed)
MRA neck [employer] auth: [id number] exp. 09/20/23-11/19/23  MRA head [employer] auth: [id number] exp. 09/20/23-11/19/23 sent to GI [phone]

## 2023-10-05 ENCOUNTER — Telehealth: Payer: Medicaid Other | Admitting: Obstetrics and Gynecology

## 2023-10-05 DIAGNOSIS — N951 Menopausal and female climacteric states: Secondary | ICD-10-CM

## 2023-10-05 NOTE — Progress Notes (Signed)
GYNECOLOGY VIRTUAL VISIT ENCOUNTER NOTE  Provider location: Center for [facility]   Patient location: Home  I connected with [name] on 10/05/23 at  8:35 AM EDT by MyChart Video Encounter and verified that I am speaking with the correct person using two identifiers.   I discussed the limitations, risks, security and privacy concerns of performing an evaluation and management service virtually and the availability of in person appointments. I also discussed with the patient that there may be a patient responsible charge related to this service. The patient expressed understanding and agreed to proceed.   History:  [name] is a 42 y.o. G20P0020 female being evaluated today for VSM symptom management. Feels the gabapentin has been helping some but not as much as she wishes. No excess drowsiness or other concerns.      Past Medical History:  Diagnosis Date   Anxiety    Asthma    Back pain, chronic    Depression    hx of    Dysrhythmia 12/06/2011   hx of a-fib   Ectopic pregnancy    GERD (gastroesophageal reflux disease)    Headache    occasional    Hypertension    was told, but never on meds   Infection    UTI   Ovarian cyst    Urinary tract infection    Past Surgical History:  Procedure Laterality Date   CLEFT PALATE REPAIR     etopical surgery     LAPAROSCOPY FOR ECTOPIC PREGNANCY  2011   TYMPANOPLASTY Right 08/03/2015   Procedure: RIGHT MEDIAL TECHNIQUE TYMPANOPLASTY;  Surgeon: Flo Shanks, MD;  Location: [facility];  Service: ENT;  Laterality: Right;   VENTRAL HERNIA REPAIR N/A 03/26/2015   Procedure: LAPAROSCOPIC REPAIR OF INCARCERATED SUPERUMBILICAL HERNIA X2 AND UMBILICAL HERNIA REPAIR WITH MESH;  Surgeon: Karie Soda, MD;  Location: [facility];  Service: General;  Laterality: N/A;   The following portions of the patient's history were reviewed and updated as appropriate: allergies, current  medications, past family history, past medical history, past social history, past surgical history and problem list.   Health Maintenance:      Component Value Date/Time   DIAGPAP  08/23/2023 1140    - Negative for intraepithelial lesion or malignancy (NILM)   HPVHIGH Negative 08/23/2023 1140   ADEQPAP  08/23/2023 1140    Satisfactory for evaluation; transformation zone component PRESENT.    High Risk HPV: Positive  Adequacy:  Satisfactory for evaluation, transformation zone component PRESENT  Diagnosis:  Atypical squamous cells of undetermined significance (ASC-US)    Review of Systems:  Pertinent items noted in HPI and remainder of comprehensive ROS otherwise negative.  Physical Exam:   General:  Alert, oriented and cooperative. Patient appears to be in no acute distress.  Mental Status: Normal mood and affect. Normal behavior. Normal judgment and thought content.   Respiratory: Normal respiratory effort, no problems with respiration noted  Rest of physical exam deferred due to type of encounter   Assessment and Plan:     1. Vasomotor symptoms due to menopause Currently on 100mg  at bedtime, increase to 200mg  with her remaining script. If she notes satisfactory improvement, will send in new rx for 200mg  dosing, if she feels response is still inadequate, will send script fo r300mg  at bedtime and re-evaluate response.       I discussed the assessment and treatment plan with the patient. The patient was provided an  opportunity to ask questions and all were answered. The patient agreed with the plan and demonstrated an understanding of the instructions.   The patient was advised to call back or seek an in-person evaluation/go to the ED if the symptoms worsen or if the condition fails to improve as anticipated.  I provided 10 minutes of face-to-face time during this encounter.   Lorriane Shire, MD Center for [employer], [facility]

## 2023-10-06 DIAGNOSIS — Z419 Encounter for procedure for purposes other than remedying health state, unspecified: Secondary | ICD-10-CM | POA: Diagnosis not present

## 2023-10-06 DIAGNOSIS — G4733 Obstructive sleep apnea (adult) (pediatric): Secondary | ICD-10-CM | POA: Diagnosis not present

## 2023-10-06 DIAGNOSIS — M25561 Pain in right knee: Secondary | ICD-10-CM | POA: Diagnosis not present

## 2023-10-06 DIAGNOSIS — S8001XA Contusion of right knee, initial encounter: Secondary | ICD-10-CM | POA: Diagnosis not present

## 2023-10-09 DIAGNOSIS — F331 Major depressive disorder, recurrent, moderate: Secondary | ICD-10-CM | POA: Diagnosis not present

## 2023-10-09 DIAGNOSIS — F411 Generalized anxiety disorder: Secondary | ICD-10-CM | POA: Diagnosis not present

## 2023-10-13 DIAGNOSIS — M25552 Pain in left hip: Secondary | ICD-10-CM | POA: Diagnosis not present

## 2023-10-13 DIAGNOSIS — M7918 Myalgia, other site: Secondary | ICD-10-CM | POA: Diagnosis not present

## 2023-10-26 ENCOUNTER — Telehealth: Payer: Self-pay | Admitting: Anesthesiology

## 2023-10-26 NOTE — Telephone Encounter (Signed)
Message sent to patient through MyChart inquiring about her CPAP compliance.

## 2023-10-30 ENCOUNTER — Telehealth: Payer: Medicaid Other | Admitting: Adult Health

## 2023-10-30 NOTE — Progress Notes (Deleted)
[facility] [address] [place]. [place] [address] [phone]       OFFICE FOLLOW UP NOTE  Ms. [name] Date of Birth:  1981-05-21 Medical Record Number:  [id number]    Primary neurologist: Dr. Frances Furbish Reason for visit: Initial CPAP follow-up  Virtual Visit via Video Note  I connected with [name] on 10/30/23 at  2:45 PM EST by a video enabled telemedicine application and verified that I am speaking with the correct person using two identifiers.  Location: Patient: *** Provider: in office   I discussed the limitations of evaluation and management by telemedicine and the availability of in person appointments. The patient expressed understanding and agreed to proceed.    SUBJECTIVE:  Follow-up visit:   Brief HPI:   [name] is a 42 y.o. female who was evaluated by Dr. Frances Furbish on 08/03/2023 for longstanding history of tremor likely in setting of essential tremor.  Discussed factors affecting tremor such as medications and sleep deprivation or poor sleep quality and concern of underlying sleep apnea with noted snoring, nocturia and occasional morning headaches.  Recommended completion of MRI brain to rule out structural cause of tremor and HST to evaluate for possible underlying OSA.  HST 08/22/2023 showed moderate sleep apnea with total AHI 26.7/h and accentuated during REM sleep with REM AHI 42.7/h and O2 nadir of 69% with significant time below or at 88% saturation of over 25 minutes indicating nocturnal hypoxemia although possible errors with O2 sensor and his/sensor several times during wakefulness.  AutoPap initiated 09/15/2023.     Interval history:  Being seen today via MyChart video visit for initial CPAP compliance visit.  CPAP compliance report as below showing poor usage although optimal residual AHI with use.          ROS:   14 system review of systems performed and negative with exception of those  listed in HPI  PMH:  Past Medical History:  Diagnosis Date   Anxiety    Asthma    Back pain, chronic    Depression    hx of    Dysrhythmia 12/06/2011   hx of a-fib   Ectopic pregnancy    GERD (gastroesophageal reflux disease)    Headache    occasional    Hypertension    was told, but never on meds   Infection    UTI   Ovarian cyst    Urinary tract infection     PSH:  Past Surgical History:  Procedure Laterality Date   CLEFT PALATE REPAIR     etopical surgery     LAPAROSCOPY FOR ECTOPIC PREGNANCY  2011   TYMPANOPLASTY Right 08/03/2015   Procedure: RIGHT MEDIAL TECHNIQUE TYMPANOPLASTY;  Surgeon: Flo Shanks, MD;  Location: [facility];  Service: ENT;  Laterality: Right;   VENTRAL HERNIA REPAIR N/A 03/26/2015   Procedure: LAPAROSCOPIC REPAIR OF INCARCERATED SUPERUMBILICAL HERNIA X2 AND UMBILICAL HERNIA REPAIR WITH MESH;  Surgeon: Karie Soda, MD;  Location: [facility];  Service: General;  Laterality: N/A;    Social History:  Social History   Socioeconomic History   Marital status: Single    Spouse name: Not on file   Number of children: Not on file   Years of education: Not on file   Highest education level: Not on file  Occupational History   Not on file  Tobacco Use   Smoking status: Former    Current packs/day: 0.25    Average packs/day: 0.3 packs/day for  15.0 years (3.8 ttl pk-yrs)    Types: Cigarettes   Smokeless tobacco: Never  Vaping Use   Vaping status: Never Used  Substance and Sexual Activity   Alcohol use: Not Currently    Alcohol/week: 1.0 standard drink of alcohol    Types: 1 Standard drinks or equivalent per week    Comment: social   Drug use: No   Sexual activity: Yes    Partners: Male    Birth control/protection: None  Other Topics Concern   Not on file  Social History Narrative   Right handed   Caffeine-none   Lives with fiance   Social Determinants of Health   Financial Resource Strain: Not on file  Food Insecurity:  Not on file  Transportation Needs: Not on file  Physical Activity: Not on file  Stress: Not on file  Social Connections: Not on file  Intimate Partner Violence: Not on file    Family History:  Family History  Problem Relation Age of Onset   Hypertension Mother    Hypertension Father    Cancer Sister    Heart disease Maternal Grandmother    Cancer Maternal Grandfather    Colon cancer Maternal Grandfather    Other Neg Hx    Hearing loss Neg Hx    Rectal cancer Neg Hx    Stomach cancer Neg Hx    Esophageal cancer Neg Hx     Medications:   Current Outpatient Medications on File Prior to Visit  Medication Sig Dispense Refill   albuterol (VENTOLIN HFA) 108 (90 Base) MCG/ACT inhaler Inhale 2 puffs into the lungs every 4 (four) hours as needed.     cloNIDine (CATAPRES) 0.1 MG tablet Take 0.1 mg by mouth at bedtime.     diltiazem (CARDIZEM CD) 180 MG 24 hr capsule Take 1 capsule (180 mg total) by mouth daily. 30 capsule 9   famotidine (PEPCID) 20 MG tablet Take 1 tablet (20 mg total) by mouth at bedtime. 30 tablet 3   Fluticasone-Salmeterol (ADVAIR HFA IN) Inhale 2 puffs into the lungs daily.     gabapentin (NEURONTIN) 100 MG capsule Take 1 capsule (100 mg total) by mouth at bedtime. 30 capsule 2   mirtazapine (REMERON) 15 MG tablet Take 1 tablet (15 mg total) by mouth at bedtime. 30 tablet 2   mirtazapine (REMERON) 30 MG tablet Take 30 mg by mouth at bedtime.     montelukast (SINGULAIR) 10 MG tablet Take 10 mg by mouth daily.     nicotine (NICODERM CQ - DOSED IN MG/24 HOURS) 14 mg/24hr patch 14 mg daily.     nicotine polacrilex (COMMIT) 4 MG lozenge 1 lozenge as needed Mouth/Throat up to 10 time(s) a day as needed for cravings     NICOTINE STEP 3 7 MG/24HR patch Place 7 mg onto the skin daily.     pantoprazole (PROTONIX) 40 MG tablet Take 1 tablet (40 mg total) by mouth daily. 30 tablet 0   rivaroxaban (XARELTO) 20 MG TABS tablet Take 1 tablet (20 mg total) by mouth daily with supper.  30 tablet 11   zolpidem (AMBIEN) 10 MG tablet Take 10 mg by mouth at bedtime as needed.     No current facility-administered medications on file prior to visit.    Allergies:   Allergies  Allergen Reactions   Flexeril [Cyclobenzaprine] Hives   Lamotrigine Hives   Tramadol Diarrhea and Hives      OBJECTIVE:  Physical Exam  There were no vitals filed for this  visit. There is no height or weight on file to calculate BMI. No results found.   General: well developed, well nourished, seated, in no evident distress Head: head normocephalic and atraumatic.   Neck: supple with no carotid or supraclavicular bruits Cardiovascular: regular rate and rhythm, no murmurs Musculoskeletal: no deformity Skin:  no rash/petichiae Vascular:  Normal pulses all extremities   Neurologic Exam Mental Status: Awake and fully alert. Oriented to place and time. Recent and remote memory intact. Attention span, concentration and fund of knowledge appropriate. Mood and affect appropriate.  Cranial Nerves: Pupils equal, briskly reactive to light. Extraocular movements full without nystagmus. Visual fields full to confrontation. Hearing intact. Facial sensation intact. Face, tongue, palate moves normally and symmetrically.  Motor: Normal bulk and tone. Normal strength in all tested extremity muscles Sensory.: intact to touch , pinprick , position and vibratory sensation.  Coordination: Rapid alternating movements normal in all extremities. Finger-to-nose and heel-to-shin performed accurately bilaterally. Gait and Station: Arises from chair without difficulty. Stance is normal. Gait demonstrates normal stride length and balance without use of AD. Tandem walk and heel toe without difficulty.  Reflexes: 1+ and symmetric. Toes downgoing.         ASSESSMENT/PLAN: [name] is a 42 y.o. year old female    OSA on CPAP : Compliance report shows satisfactory usage with optimal residual AHI.   Discussed continued nightly usage with ensuring greater than 4 hours nightly for optimal benefit and per insurance purposes.  Continue to follow with DME company for any needed supplies or CPAP related concerns     Follow up in *** or call earlier if needed   CC:  PCP: Charlane Ferretti, DO    I spent *** minutes of face-to-face and non-face-to-face time with patient.  This included previsit chart review, lab review, study review, order entry, electronic health record documentation, patient education and discussion regarding above diagnoses and treatment plan and answered all other questions to patient's satisfaction    Ihor Austin, [facility]  [facility] [address] [place], [place] [address]  Phone [phone] Fax [phone] Note: This document was prepared with digital dictation and possible smart phrase technology. Any transcriptional errors that result from this process are unintentional.

## 2023-11-05 DIAGNOSIS — G4733 Obstructive sleep apnea (adult) (pediatric): Secondary | ICD-10-CM | POA: Diagnosis not present

## 2023-11-05 DIAGNOSIS — Z419 Encounter for procedure for purposes other than remedying health state, unspecified: Secondary | ICD-10-CM | POA: Diagnosis not present

## 2023-11-08 ENCOUNTER — Ambulatory Visit: Payer: Medicaid Other | Admitting: Gastroenterology

## 2023-11-14 ENCOUNTER — Encounter: Payer: Self-pay | Admitting: Neurology

## 2023-11-15 ENCOUNTER — Telehealth: Payer: Self-pay | Admitting: Neurology

## 2023-11-15 MED ORDER — ALPRAZOLAM 0.5 MG PO TABS: ORAL_TABLET | ORAL | 0 refills | Status: DC

## 2023-11-15 NOTE — Telephone Encounter (Signed)
I have ordered Xanax for patient's upcoming MRAs due to anxiety/claustrophobia reported. Please inform patient or caregiver and remind them, that she should not drive after taking Xanax and have someone take her for the MRI appointment.

## 2023-11-15 NOTE — Telephone Encounter (Signed)
Spoke with the patient and discussed the Xanax prescription and instructions and advised patient that she should not drive after taking Xanax and she will need to have someone take her to and from the appointment.  She verbalized understanding.  She did not have any questions and states she has had Xanax before. She appreciated the call.

## 2023-11-15 NOTE — Telephone Encounter (Signed)
Pt requesting medication for claustrophobia for MRI scheduled 11/16/23

## 2023-11-16 ENCOUNTER — Ambulatory Visit
Admission: RE | Admit: 2023-11-16 | Discharge: 2023-11-16 | Disposition: A | Discharge status: Home / Self Care | Payer: Medicaid Other | Source: Ambulatory Visit | Attending: Neurology | Admitting: Neurology

## 2023-11-16 DIAGNOSIS — I779 Disorder of arteries and arterioles, unspecified: Secondary | ICD-10-CM

## 2023-11-16 DIAGNOSIS — R9089 Other abnormal findings on diagnostic imaging of central nervous system: Secondary | ICD-10-CM | POA: Diagnosis not present

## 2023-11-16 DIAGNOSIS — I6522 Occlusion and stenosis of left carotid artery: Secondary | ICD-10-CM

## 2023-11-16 MED ORDER — GADOPICLENOL 0.5 MMOL/ML IV SOLN
6.0000 mL | Freq: Once | INTRAVENOUS | Status: AC | PRN
  Administered 2023-11-16: 6 mL via INTRAVENOUS

## 2023-11-17 ENCOUNTER — Telehealth: Payer: Self-pay | Admitting: *Deleted

## 2023-11-17 NOTE — Telephone Encounter (Signed)
-----   Message from Huston Foley sent at 11/16/2023  5:04 PM EST ----- Please call patient and advise her that her MR angiogram of her neck shows a blockage in her left main neck artery which is called the internal carotid artery.  This is not a new finding and is likely chronic, unclear how long this blockage has been there but it was suspected on the basis of her recent brain MRI as well.  Again, it looks like there is a complete blockage which means that there is nothing that can be done about it at this very moment.  She is already on a blood thinner and she does not need to change anything in her management but I would like to get input from a vascular surgeon about this and her further risk for other/additional blockages in the future.  We will go ahead with a referral to a vascular surgeon if she is agreeable.

## 2023-11-17 NOTE — Telephone Encounter (Signed)
I called pt relayed the results of the MRV per below,  L internal carotid artery blockage. Not new, likely chronic. (Not sure how long there).  She verbalized understanding.  She is ok to see vascular surgeon.

## 2023-11-20 NOTE — Telephone Encounter (Signed)
noted 

## 2023-11-20 NOTE — Telephone Encounter (Signed)
I canceled the pending referral to vascular surgery as I had placed 1 on 11/16/2023. Thank you for speaking to the patient to explain.

## 2023-11-27 ENCOUNTER — Encounter: Payer: Self-pay | Admitting: Obstetrics and Gynecology

## 2023-11-27 ENCOUNTER — Other Ambulatory Visit: Payer: Self-pay | Admitting: Obstetrics and Gynecology

## 2023-11-27 DIAGNOSIS — N951 Menopausal and female climacteric states: Secondary | ICD-10-CM

## 2023-11-30 MED ORDER — GABAPENTIN 300 MG PO CAPS
300.0000 mg | ORAL_CAPSULE | Freq: Every day | ORAL | 3 refills | Status: AC
Start: 2023-11-30 — End: ?

## 2023-12-06 DIAGNOSIS — G4733 Obstructive sleep apnea (adult) (pediatric): Secondary | ICD-10-CM | POA: Diagnosis not present

## 2023-12-06 DIAGNOSIS — Z419 Encounter for procedure for purposes other than remedying health state, unspecified: Secondary | ICD-10-CM | POA: Diagnosis not present

## 2023-12-11 ENCOUNTER — Other Ambulatory Visit: Payer: Self-pay

## 2023-12-11 ENCOUNTER — Ambulatory Visit (HOSPITAL_COMMUNITY)
Admission: EM | Admit: 2023-12-11 | Discharge: 2023-12-11 | Disposition: A | Discharge status: Home / Self Care | Payer: Medicaid Other | Attending: Emergency Medicine | Admitting: Emergency Medicine

## 2023-12-11 ENCOUNTER — Other Ambulatory Visit (HOSPITAL_COMMUNITY): Payer: Self-pay

## 2023-12-11 ENCOUNTER — Ambulatory Visit (INDEPENDENT_AMBULATORY_CARE_PROVIDER_SITE_OTHER): Payer: Medicaid Other

## 2023-12-11 ENCOUNTER — Encounter (HOSPITAL_COMMUNITY): Payer: Self-pay | Admitting: Emergency Medicine

## 2023-12-11 DIAGNOSIS — S93491A Sprain of other ligament of right ankle, initial encounter: Secondary | ICD-10-CM

## 2023-12-11 DIAGNOSIS — M79671 Pain in right foot: Secondary | ICD-10-CM

## 2023-12-11 MED ORDER — IBUPROFEN 800 MG PO TABS
800.0000 mg | ORAL_TABLET | Freq: Three times a day (TID) | ORAL | 0 refills | Status: AC | PRN
Start: 2023-12-11 — End: 2023-12-25
  Filled 2023-12-11: qty 42, 14d supply, fill #0

## 2023-12-11 NOTE — ED Triage Notes (Signed)
 Stepping off bur felt something in right foot.  Pain started in back of foot and radiates around lateral foot.  Has taken ibuprofen 800 mg, but this is not helping.  Extension of foot causes pain

## 2023-12-11 NOTE — Discharge Instructions (Signed)
 I have enclosed some information about ankle sprains and rehabilitation from ankle sprains that I hope you find helpful.  Please wear the cam boot at all times when you are walking, even right after you get up in the morning.    I sent prescription for ibuprofen  800 mg tablets to your pharmacy that you can take every 8 hours as needed for pain.  Please keep your foot elevated when you are not up and about and try to make time to ice your foot for 20 minutes 4 times daily.  If you are not feeling better in the next 5 to 7 days, please consider following up with an orthopedic provider for more advanced imaging such as an ultrasound or an MRI.  Thank you for visiting [facility] today.  We appreciate the opportunity to participate in your care.

## 2023-12-11 NOTE — ED Provider Notes (Signed)
 [facility]    CSN: [id number] Arrival date & time: 12/11/23  1512    HISTORY   Chief Complaint  Patient presents with   Foot Pain   HPI [name] is a pleasant, 43 y.o. female who presents to urgent care today. Patient states that 2 days ago, while stepping off the bus with her left foot her toe of her right foot lingered on the rubber mat on the last step with the bus causing her foot to hyperextend and rotate inward.  Patient states she did not hear or feel a pop but did not immediately feel soreness in the lateral aspect of her posterior foot.   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  The history is provided by the patient.   Past Medical History:  Diagnosis Date   Anxiety    Asthma    Back pain, chronic    Depression    hx of    Dysrhythmia 12/06/2011   hx of a-fib   Ectopic pregnancy    GERD (gastroesophageal reflux disease)    Headache    occasional    Hypertension    was told, but never on meds   Infection    UTI   Ovarian cyst    Urinary tract infection    Patient Active Problem List   Diagnosis Date Noted   Loss of appetite 08/03/2023   Diarrhea 05/09/2023   Gastroesophageal reflux disease 02/28/2023   Change in bowel habits 02/28/2023   Chronic anticoagulation 02/28/2023   A-fib (HCC) 07/18/2022   Nausea and vomiting 07/17/2022   Hyperglycemia 07/17/2022   Epigastric abdominal pain 07/17/2022   Hiatal hernia 07/17/2022   Closed fracture of right ankle 04/13/2018   Ileus (HCC) 03/28/2015   Incarcerated ventral hernia s/p lap repair w mesh 03/25/2014 03/26/2015   Umbilical hernia s/p lap repair w mesh 03/25/2014 03/26/2015   Bipolar disorder, unspecified (HCC) 09/01/2014   Back muscle spasm 09/01/2014   Smoking  09/01/2014   Elevated BP 09/01/2014   PAF (paroxysmal atrial fibrillation) (HCC) 01/13/2013   Essential hypertension 01/13/2013   Past Surgical History:  Procedure Laterality Date   CLEFT PALATE REPAIR     etopical surgery     LAPAROSCOPY FOR ECTOPIC PREGNANCY  2011   TYMPANOPLASTY Right 08/03/2015   Procedure: RIGHT MEDIAL TECHNIQUE TYMPANOPLASTY;  Surgeon: Marlyce Finer, MD;  [facility];  Service: ENT;  Laterality: Right;   VENTRAL HERNIA REPAIR N/A 03/26/2015   Procedure: LAPAROSCOPIC REPAIR OF INCARCERATED SUPERUMBILICAL HERNIA X2 AND UMBILICAL HERNIA REPAIR WITH MESH;  Surgeon: Elspeth Schultze, MD;  Location: [facility];  Service: General;  Laterality: N/A;   OB History     Gravida  2   Para      Term      Preterm      AB  2   Living  0      SAB  1   IAB  0   Ectopic  1   Multiple  0   Live Births             Home Medications    Prior to Admission medications   Medication Sig Start Date End Date Taking? Authorizing Provider  albuterol  (VENTOLIN  HFA) 108 (90 Base) MCG/ACT  inhaler Inhale 2 puffs into the lungs every 4 (four) hours as needed.    [provider]  ALPRAZolam  (XANAX ) 0.5 MG tablet Take 1-2 pills as needed on call to MRI.  May take a 3rd pill if needed. Patient not taking: Reported on 12/11/2023 11/15/23   Athar, Saima, MD  cloNIDine (CATAPRES) 0.1 MG tablet Take 0.1 mg by mouth at bedtime. 04/11/23   [provider]  diltiazem  (CARDIZEM  CD) 180 MG 24 hr capsule Take 1 capsule (180 mg total) by mouth daily. 01/03/23   Hobart Powell BRAVO, MD  famotidine  (PEPCID ) 20 MG tablet Take 1 tablet (20 mg total) by mouth at bedtime. 08/02/23   Zehr, Jessica D, PA-C  Fluticasone -Salmeterol (ADVAIR HFA IN) Inhale 2 puffs into the lungs daily.    [provider]  gabapentin  (NEURONTIN ) 300 MG capsule Take 1 capsule (300 mg total) by mouth at bedtime. 11/30/23   Fredirick Glenys RAMAN, MD  mirtazapine  (REMERON ) 15 MG tablet Take  1 tablet (15 mg total) by mouth at bedtime. 08/02/23   Zehr, Jessica D, PA-C  mirtazapine  (REMERON ) 30 MG tablet Take 30 mg by mouth at bedtime. 09/12/23   [provider]  montelukast  (SINGULAIR ) 10 MG tablet Take 10 mg by mouth daily. 06/27/22   [provider]  nicotine  (NICODERM CQ  - DOSED IN MG/24 HOURS) 14 mg/24hr patch 14 mg daily.    [provider]  nicotine  polacrilex (COMMIT) 4 MG lozenge 1 lozenge as needed Mouth/Throat up to 10 time(s) a day as needed for cravings    [provider]  NICOTINE  STEP 3 7 MG/24HR patch Place 7 mg onto the skin daily.    [provider]  pantoprazole  (PROTONIX ) 40 MG tablet Take 1 tablet (40 mg total) by mouth daily. 07/18/22 10/05/23  Kaul, Aseem, MD  rivaroxaban  (XARELTO ) 20 MG TABS tablet Take 1 tablet (20 mg total) by mouth daily with supper. 11/03/22   Hobart Powell BRAVO, MD  zolpidem  (AMBIEN ) 10 MG tablet Take 10 mg by mouth at bedtime as needed. 05/15/23   [provider]    Family History Family History  Problem Relation Age of Onset   Hypertension Mother    Hypertension Father    Cancer Sister    Heart disease Maternal Grandmother    Cancer Maternal Grandfather    Colon cancer Maternal Grandfather    Other Neg Hx    Hearing loss Neg Hx    Rectal cancer Neg Hx    Stomach cancer Neg Hx    Esophageal cancer Neg Hx    Social History Social History   Tobacco Use   Smoking status: Former    Current packs/day: 0.25    Average packs/day: 0.3 packs/day for 15.0 years (3.8 ttl pk-yrs)    Types: Cigarettes   Smokeless tobacco: Never  Vaping Use   Vaping status: Never Used  Substance Use Topics   Alcohol  use: Not Currently    Alcohol /week: 1.0 standard drink of alcohol     Types: 1 Standard drinks or equivalent per week    Comment: social   Drug use: No   Allergies   Flexeril  [cyclobenzaprine ], Lamotrigine, and Tramadol   Review of Systems Review of Systems Pertinent findings  revealed after performing a 14 point review of systems has been noted in the history of present illness.  Physical Exam Vital Signs BP (!) 149/101 (BP Location: Left Arm)   Pulse 80   Temp 98.1 F (36.7 C) (Oral)   Resp 20  LMP 02/26/2015   SpO2 98%   No data found.  Physical Exam Vitals and nursing note reviewed.  Constitutional:      General: She is not in acute distress.    Appearance: Normal appearance.  HENT:     Head: Normocephalic and atraumatic.  Eyes:     Pupils: Pupils are equal, round, and reactive to light.  Cardiovascular:     Rate and Rhythm: Normal rate and regular rhythm.  Pulmonary:     Effort: Pulmonary effort is normal.     Breath sounds: Normal breath sounds.  Musculoskeletal:     Cervical back: Normal range of motion and neck supple.     Right foot: Decreased range of motion. Normal capillary refill. Tenderness present. No swelling, deformity, bony tenderness or crepitus. Normal pulse.       Feet:  Skin:    General: Skin is warm and dry.  Neurological:     General: No focal deficit present.     Mental Status: She is alert and oriented to person, place, and time. Mental status is at baseline.  Psychiatric:        Mood and Affect: Mood normal.        Behavior: Behavior normal.        Thought Content: Thought content normal.        Judgment: Judgment normal.     UC Couse / Diagnostics / Procedures:     Radiology No results found.  Procedures Procedures (including critical care time) EKG  Pending results:  Labs Reviewed - No data to display  Medications Ordered in UC: Medications - No data to display  UC Diagnoses / Final Clinical Impressions(s)   I have reviewed the triage vital signs and the nursing notes.  Pertinent labs & imaging results that were available during my care of the patient were reviewed by me and considered in my medical decision making (see chart for details).    Final diagnoses:  Right foot pain  Sprain of  other ligament of right ankle, initial encounter   Patient was placed in a cam boot and provided with a prescription for ibuprofen  100 mg tablets that she can take 3 times daily as needed for pain.  Patient advised to keep her foot elevated is much as possible and ice it is much as possible.  Orthopedic follow-up recommended if no better in 5 to 7 days.  Please see discharge instructions below for details of plan of care as provided to patient. ED Prescriptions     Medication Sig Dispense Auth. Provider   ibuprofen  (ADVIL ) 800 MG tablet Take 1 tablet (800 mg total) by mouth every 8 (eight) hours as needed for up to 14 days. 42 tablet Joesph Shaver Scales, PA-C      PDMP not reviewed this encounter.    Discharge Instructions      I have enclosed some information about ankle sprains and rehabilitation from ankle sprains that I hope you find helpful.  Please wear the cam boot at all times when you are walking, even right after you get up in the morning.    I sent prescription for ibuprofen  800 mg tablets to your pharmacy that you can take every 8 hours as needed for pain.  Please keep your foot elevated when you are not up and about and try to make time to ice your foot for 20 minutes 4 times daily.  If you are not feeling better in the next 5 to 7 days, please  consider following up with an orthopedic provider for more advanced imaging such as an ultrasound or an MRI.  Thank you for visiting [facility] today.  We appreciate the opportunity to participate in your care.      Disposition Upon Discharge:  Condition: stable for discharge home Home: take medications as prescribed; routine discharge instructions as discussed; follow up as advised.  Patient presented with an acute illness with associated systemic symptoms and significant discomfort requiring urgent management. In my opinion, this is a condition that a prudent lay person (someone who possesses an average  knowledge of health and medicine) may potentially expect to result in complications if not addressed urgently such as respiratory distress, impairment of bodily function or dysfunction of bodily organs.   Routine symptom specific, illness specific and/or disease specific instructions were discussed with the patient and/or caregiver at length.   As such, the patient has been evaluated and assessed, work-up was performed and treatment was provided in alignment with urgent care protocols and evidence based medicine.  Patient/parent/caregiver has been advised that the patient may require follow up for further testing and treatment if the symptoms continue in spite of treatment, as clinically indicated and appropriate.  Patient/parent/caregiver has been advised to report to orthopedic urgent care clinic or return to the [facility] or PCP in 3-5 days if no better; follow-up with orthopedics, PCP or the Emergency Department if new signs and symptoms develop or if the current signs or symptoms continue to change or worsen for further workup, evaluation and treatment as clinically indicated and appropriate  The patient will follow up with their current PCP if and as advised. If the patient does not currently have a PCP we will have assisted them in obtaining one.   The patient may need specialty follow up if the symptoms continue, in spite of conservative treatment and management, for further workup, evaluation, consultation and treatment as clinically indicated and appropriate.  Patient/parent/caregiver verbalized understanding and agreement of plan as discussed.  All questions were addressed during visit.  Please see discharge instructions below for further details of plan.  This office note has been dictated using Teaching laboratory technician.  Unfortunately, this method of dictation can sometimes lead to typographical or grammatical errors.  I apologize for your inconvenience in advance if this occurs.   Please do not hesitate to reach out to me if clarification is needed.      Joesph Shaver Scales, PA-C 12/11/23 1757

## 2023-12-12 DIAGNOSIS — F411 Generalized anxiety disorder: Secondary | ICD-10-CM | POA: Diagnosis not present

## 2023-12-12 DIAGNOSIS — F331 Major depressive disorder, recurrent, moderate: Secondary | ICD-10-CM | POA: Diagnosis not present

## 2023-12-13 ENCOUNTER — Encounter: Payer: Self-pay | Admitting: Physician Assistant

## 2023-12-13 ENCOUNTER — Ambulatory Visit: Payer: Medicaid Other | Admitting: Physician Assistant

## 2023-12-13 DIAGNOSIS — S93401A Sprain of unspecified ligament of right ankle, initial encounter: Secondary | ICD-10-CM | POA: Diagnosis not present

## 2023-12-13 MED ORDER — HYDROCODONE-ACETAMINOPHEN 5-325 MG PO TABS: 1.0000 | ORAL_TABLET | ORAL | 0 refills | Status: DC | PRN

## 2023-12-13 NOTE — Progress Notes (Signed)
 Office Visit Note   Patient: [name]           Date of Birth: October 18, 1981           MRN: [id number] Visit Date: 12/13/2023              Requested by: Valentin Skates, DO [address] [place],  [place] [address] PCP: Valentin Skates, DO   Assessment & Plan: Visit Diagnoses:  1. Sprain of unspecified ligament of right ankle, initial encounter     Plan: Dawna is a pleasant 43 year old woman who is 4 days status post stepping off a bus awkwardly and hyperextending her foot.  She is complaining of pain in the posterior part of her heel.  Some pain laterally.  She was seen by urgent care who placed her in a cam walker boot which helps.  She has good range of motion though painful.  Achilles is intact.  Tender more on the posterior calcaneus.  Will keep her in the cam boot and reexamine her in 2 weeks.  Given her small amount of hydrocodone .  Follow-Up Instructions: Return in about 2 weeks (around 12/27/2023).   Orders:  No orders of the defined types were placed in this encounter.  Meds ordered this encounter  Medications   HYDROcodone -acetaminophen  (NORCO/VICODIN) 5-325 MG tablet    Sig: Take 1 tablet by mouth every 4 (four) hours as needed for moderate pain (pain score 4-6).    Dispense:  20 tablet    Refill:  0      Procedures: No procedures performed   Clinical Data: No additional findings.   Subjective: Chief Complaint  Patient presents with   Right Heel - Pain    HPI pleasant 43 year old woman who is 4 days status post spraining the back of her right foot after stepping off of a bus awkwardly.  She does have pain in the posterior calcaneus but no swelling no ecchymosis.  She has been taking ibuprofen  and was given a boot at urgent care which she thinks is helping a little bit  Review of Systems  All other systems reviewed and are negative.    Objective: Vital Signs: LMP 02/26/2015   Physical Exam Constitutional:      Appearance: Normal appearance.   Pulmonary:     Effort: Pulmonary effort is normal.  Skin:    General: Skin is warm and dry.  Neurological:     Mental Status: She is alert.  Psychiatric:        Mood and Affect: Mood normal.        Behavior: Behavior normal.     Ortho Exam Examination of her right ankle she has mild soft tissue swelling pulses are intact she is able to wiggle her toes she is able to flex her ankle though very painful posteriorly she has good eversion inversion.  No ecchymosis.  She is tender over the posterior heel and a little bit in the lateral ligaments negative Homans' sign Thompson's test her Achilles is palpable and she is not tender over the Achilles but more on the posterior calcaneus itself Specialty Comments:  No specialty comments available.  Imaging: No results found.   PMFS History: Patient Active Problem List   Diagnosis Date Noted   Sprain of unspecified ligament of right ankle, initial encounter 12/13/2023   Loss of appetite 08/03/2023   Diarrhea 05/09/2023   Gastroesophageal reflux disease 02/28/2023   Change in bowel habits 02/28/2023   Chronic anticoagulation 02/28/2023   A-fib (  HCC) 07/18/2022   Nausea and vomiting 07/17/2022   Hyperglycemia 07/17/2022   Epigastric abdominal pain 07/17/2022   Hiatal hernia 07/17/2022   Closed fracture of right ankle 04/13/2018   Ileus (HCC) 03/28/2015   Incarcerated ventral hernia s/p lap repair w mesh 03/25/2014 03/26/2015   Umbilical hernia s/p lap repair w mesh 03/25/2014 03/26/2015   Bipolar disorder, unspecified (HCC) 09/01/2014   Back muscle spasm 09/01/2014   Smoking 09/01/2014   Elevated BP 09/01/2014   PAF (paroxysmal atrial fibrillation) (HCC) 01/13/2013   Essential hypertension 01/13/2013   Past Medical History:  Diagnosis Date   Anxiety    Asthma    Back pain, chronic    Depression    hx of    Dysrhythmia 12/06/2011   hx of a-fib   Ectopic pregnancy    GERD (gastroesophageal reflux disease)    Headache     occasional    Hypertension    was told, but never on meds   Infection    UTI   Ovarian cyst    Urinary tract infection     Family History  Problem Relation Age of Onset   Hypertension Mother    Hypertension Father    Cancer Sister    Heart disease Maternal Grandmother    Cancer Maternal Grandfather    Colon cancer Maternal Grandfather    Other Neg Hx    Hearing loss Neg Hx    Rectal cancer Neg Hx    Stomach cancer Neg Hx    Esophageal cancer Neg Hx     Past Surgical History:  Procedure Laterality Date   CLEFT PALATE REPAIR     etopical surgery     LAPAROSCOPY FOR ECTOPIC PREGNANCY  2011   TYMPANOPLASTY Right 08/03/2015   Procedure: RIGHT MEDIAL TECHNIQUE TYMPANOPLASTY;  Surgeon: Marlyce Finer, MD;  Location: [facility];  Service: ENT;  Laterality: Right;   VENTRAL HERNIA REPAIR N/A 03/26/2015   Procedure: LAPAROSCOPIC REPAIR OF INCARCERATED SUPERUMBILICAL HERNIA X2 AND UMBILICAL HERNIA REPAIR WITH MESH;  Surgeon: Elspeth Schultze, MD;  Location: [facility];  Service: General;  Laterality: N/A;   Social History   Occupational History   Not on file  Tobacco Use   Smoking status: Former    Current packs/day: 0.25    Average packs/day: 0.3 packs/day for 15.0 years (3.8 ttl pk-yrs)    Types: Cigarettes   Smokeless tobacco: Never  Vaping Use   Vaping status: Never Used  Substance and Sexual Activity   Alcohol  use: Not Currently    Alcohol /week: 1.0 standard drink of alcohol     Types: 1 Standard drinks or equivalent per week    Comment: social   Drug use: No   Sexual activity: Yes    Partners: Male    Birth control/protection: None

## 2023-12-27 ENCOUNTER — Ambulatory Visit: Payer: Medicaid Other | Admitting: Physician Assistant

## 2023-12-28 ENCOUNTER — Other Ambulatory Visit: Payer: Self-pay | Admitting: Cardiology

## 2023-12-28 DIAGNOSIS — I480 Paroxysmal atrial fibrillation: Secondary | ICD-10-CM

## 2023-12-28 MED ORDER — DILTIAZEM HCL ER COATED BEADS 180 MG PO CP24: 180.0000 mg | ORAL_CAPSULE | Freq: Every day | ORAL | 0 refills | Status: DC

## 2023-12-28 MED ORDER — RIVAROXABAN 20 MG PO TABS: 20.0000 mg | ORAL_TABLET | Freq: Every day | ORAL | 1 refills | Status: DC

## 2023-12-28 NOTE — Telephone Encounter (Signed)
Pt's medication was sent to pt's pharmacy as requested. Confirmation received.  °

## 2023-12-28 NOTE — Telephone Encounter (Signed)
Prescription refill request for Xarelto received.  Indication: Afib  Last office visit:03/03/23 Shari Prows)  Weight: 66.2kg Age: 43 Scr: 0.6 (07/12/23)  CrCl: 126.41ml/min  Appropriate dose. Refill sent.

## 2023-12-28 NOTE — Telephone Encounter (Signed)
*  STAT* If patient is at the pharmacy, call can be transferred to refill team.   1. Which medications need to be refilled? (please list name of each medication and dose if known)  rivaroxaban (XARELTO) 20 MG TABS tablet  diltiazem (CARDIZEM CD) 180 MG 24 hr capsule   2. Which pharmacy/location (including street and city if local pharmacy) is medication to be sent to?  [id number] - [place], [place] - [address] AT [facility] [address]   3. Do they need a 30 day or 90 day supply? 90

## 2024-01-01 DIAGNOSIS — J449 Chronic obstructive pulmonary disease, unspecified: Secondary | ICD-10-CM | POA: Insufficient documentation

## 2024-01-06 DIAGNOSIS — G4733 Obstructive sleep apnea (adult) (pediatric): Secondary | ICD-10-CM | POA: Diagnosis not present

## 2024-01-06 DIAGNOSIS — Z419 Encounter for procedure for purposes other than remedying health state, unspecified: Secondary | ICD-10-CM | POA: Diagnosis not present

## 2024-01-09 DIAGNOSIS — R12 Heartburn: Secondary | ICD-10-CM | POA: Diagnosis not present

## 2024-01-09 DIAGNOSIS — E28319 Asymptomatic premature menopause: Secondary | ICD-10-CM | POA: Diagnosis not present

## 2024-01-09 DIAGNOSIS — I10 Essential (primary) hypertension: Secondary | ICD-10-CM | POA: Diagnosis not present

## 2024-01-09 DIAGNOSIS — D6869 Other thrombophilia: Secondary | ICD-10-CM | POA: Diagnosis not present

## 2024-01-09 DIAGNOSIS — I739 Peripheral vascular disease, unspecified: Secondary | ICD-10-CM | POA: Diagnosis not present

## 2024-01-09 DIAGNOSIS — I4891 Unspecified atrial fibrillation: Secondary | ICD-10-CM | POA: Diagnosis not present

## 2024-01-09 DIAGNOSIS — J439 Emphysema, unspecified: Secondary | ICD-10-CM | POA: Diagnosis not present

## 2024-01-09 DIAGNOSIS — F419 Anxiety disorder, unspecified: Secondary | ICD-10-CM | POA: Diagnosis not present

## 2024-01-09 DIAGNOSIS — F319 Bipolar disorder, unspecified: Secondary | ICD-10-CM | POA: Diagnosis not present

## 2024-01-09 DIAGNOSIS — J45909 Unspecified asthma, uncomplicated: Secondary | ICD-10-CM | POA: Diagnosis not present

## 2024-01-09 DIAGNOSIS — M25569 Pain in unspecified knee: Secondary | ICD-10-CM | POA: Diagnosis not present

## 2024-01-22 ENCOUNTER — Other Ambulatory Visit: Payer: Self-pay | Admitting: Gastroenterology

## 2024-02-03 DIAGNOSIS — G4733 Obstructive sleep apnea (adult) (pediatric): Secondary | ICD-10-CM | POA: Diagnosis not present

## 2024-02-03 DIAGNOSIS — Z419 Encounter for procedure for purposes other than remedying health state, unspecified: Secondary | ICD-10-CM | POA: Diagnosis not present

## 2024-02-14 DIAGNOSIS — F331 Major depressive disorder, recurrent, moderate: Secondary | ICD-10-CM | POA: Diagnosis not present

## 2024-02-14 DIAGNOSIS — F411 Generalized anxiety disorder: Secondary | ICD-10-CM | POA: Diagnosis not present

## 2024-02-25 ENCOUNTER — Telehealth: Admitting: Physician Assistant

## 2024-02-25 DIAGNOSIS — J069 Acute upper respiratory infection, unspecified: Secondary | ICD-10-CM

## 2024-02-25 DIAGNOSIS — B9689 Other specified bacterial agents as the cause of diseases classified elsewhere: Secondary | ICD-10-CM | POA: Diagnosis not present

## 2024-02-26 MED ORDER — PROMETHAZINE-DM 6.25-15 MG/5ML PO SYRP
5.0000 mL | ORAL_SOLUTION | Freq: Four times a day (QID) | ORAL | 0 refills | Status: DC | PRN
Start: 2024-02-26 — End: 2024-04-07

## 2024-02-26 MED ORDER — BENZONATATE 100 MG PO CAPS
100.0000 mg | ORAL_CAPSULE | Freq: Three times a day (TID) | ORAL | 0 refills | Status: DC | PRN
Start: 2024-02-26 — End: 2024-06-27

## 2024-02-26 MED ORDER — PREDNISONE 20 MG PO TABS
40.0000 mg | ORAL_TABLET | Freq: Every day | ORAL | 0 refills | Status: DC
Start: 2024-02-26 — End: 2024-06-27

## 2024-02-26 MED ORDER — IPRATROPIUM BROMIDE 0.03 % NA SOLN
2.0000 | Freq: Two times a day (BID) | NASAL | 0 refills | Status: AC
Start: 2024-02-26 — End: ?

## 2024-02-26 MED ORDER — DOXYCYCLINE HYCLATE 100 MG PO TABS
100.0000 mg | ORAL_TABLET | Freq: Two times a day (BID) | ORAL | 0 refills | Status: DC
Start: 2024-02-26 — End: 2024-06-27

## 2024-02-26 NOTE — Progress Notes (Signed)
 E-Visit for Cough   We are sorry that you are not feeling well.  Here is how we plan to help!  Based on your presentation I believe you most likely have A cough due to bacteria.  When patients have a fever and a productive cough with a change in color or increased sputum production, we are concerned about bacterial bronchitis.  If left untreated it can progress to pneumonia.  If your symptoms do not improve with your treatment plan it is important that you contact your provider.   I have prescribed Doxycycline 100 mg twice a day for 7 days     In addition you may use A non-prescription cough medication called Mucinex DM: take 2 tablets every 12 hours. and A prescription cough medication called Tessalon Perles 100mg . You may take 1-2 capsules every 8 hours as needed for your cough.  I have prescribed Prednisone 20mg  Take 2 tablets (40mg ) daily for 5 days  AND  Ipratropium nasal spray Use 1 spray every 12 hours for runny nose and post nasal drainage.  From your responses in the eVisit questionnaire you describe inflammation in the upper respiratory tract which is causing a significant cough.  This is commonly called Bronchitis and has four common causes:   Allergies Viral Infections Acid Reflux Bacterial Infection Allergies, viruses and acid reflux are treated by controlling symptoms or eliminating the cause. An example might be a cough caused by taking certain blood pressure medications. You stop the cough by changing the medication. Another example might be a cough caused by acid reflux. Controlling the reflux helps control the cough.  USE OF BRONCHODILATOR ("RESCUE") INHALERS: There is a risk from using your bronchodilator too frequently.  The risk is that over-reliance on a medication which only relaxes the muscles surrounding the breathing tubes can reduce the effectiveness of medications prescribed to reduce swelling and congestion of the tubes themselves.  Although you feel brief relief  from the bronchodilator inhaler, your asthma may actually be worsening with the tubes becoming more swollen and filled with mucus.  This can delay other crucial treatments, such as oral steroid medications. If you need to use a bronchodilator inhaler daily, several times per day, you should discuss this with your provider.  There are probably better treatments that could be used to keep your asthma under control.     HOME CARE Only take medications as instructed by your medical team. Complete the entire course of an antibiotic. Drink plenty of fluids and get plenty of rest. Avoid close contacts especially the very young and the elderly Cover your mouth if you cough or cough into your sleeve. Always remember to wash your hands A steam or ultrasonic humidifier can help congestion.   GET HELP RIGHT AWAY IF: You develop worsening fever. You become short of breath You cough up blood. Your symptoms persist after you have completed your treatment plan MAKE SURE YOU  Understand these instructions. Will watch your condition. Will get help right away if you are not doing well or get worse.    Thank you for choosing an e-visit.  Your e-visit answers were reviewed by a board certified advanced clinical practitioner to complete your personal care plan. Depending upon the condition, your plan could have included both over the counter or prescription medications.  Please review your pharmacy choice. Make sure the pharmacy is open so you can pick up prescription now. If there is a problem, you may contact your provider through [employer] and have  the prescription routed to another pharmacy.  Your safety is important to [place]. If you have drug allergies check your prescription carefully.   For the next 24 hours you can use MyChart to ask questions about today's visit, request a non-urgent call back, or ask for a work or school excuse. You will get an email in the next two days asking about your  experience. I hope that your e-visit has been valuable and will speed your recovery.   I have spent 5 minutes in review of e-visit questionnaire, review and updating patient chart, medical decision making and response to patient.   Margaretann Loveless, PA-C

## 2024-02-26 NOTE — Addendum Note (Signed)
 Addended by: Margaretann Loveless on: 02/26/2024 11:28 AM   Modules accepted: Orders

## 2024-02-27 ENCOUNTER — Ambulatory Visit (HOSPITAL_COMMUNITY)
Admission: EM | Admit: 2024-02-27 | Discharge: 2024-02-27 | Disposition: A | Discharge status: Home / Self Care | Attending: Family Medicine | Admitting: Family Medicine

## 2024-02-27 ENCOUNTER — Ambulatory Visit (INDEPENDENT_AMBULATORY_CARE_PROVIDER_SITE_OTHER)

## 2024-02-27 ENCOUNTER — Encounter (HOSPITAL_COMMUNITY): Payer: Self-pay | Admitting: Emergency Medicine

## 2024-02-27 DIAGNOSIS — J984 Other disorders of lung: Secondary | ICD-10-CM | POA: Diagnosis not present

## 2024-02-27 DIAGNOSIS — R112 Nausea with vomiting, unspecified: Secondary | ICD-10-CM | POA: Diagnosis not present

## 2024-02-27 DIAGNOSIS — R059 Cough, unspecified: Secondary | ICD-10-CM | POA: Diagnosis not present

## 2024-02-27 DIAGNOSIS — J45909 Unspecified asthma, uncomplicated: Secondary | ICD-10-CM | POA: Diagnosis not present

## 2024-02-27 DIAGNOSIS — F319 Bipolar disorder, unspecified: Secondary | ICD-10-CM | POA: Diagnosis not present

## 2024-02-27 DIAGNOSIS — R0602 Shortness of breath: Secondary | ICD-10-CM

## 2024-02-27 DIAGNOSIS — H60391 Other infective otitis externa, right ear: Secondary | ICD-10-CM

## 2024-02-27 DIAGNOSIS — R051 Acute cough: Secondary | ICD-10-CM | POA: Diagnosis not present

## 2024-02-27 DIAGNOSIS — J45998 Other asthma: Secondary | ICD-10-CM | POA: Diagnosis not present

## 2024-02-27 DIAGNOSIS — J4521 Mild intermittent asthma with (acute) exacerbation: Secondary | ICD-10-CM

## 2024-02-27 DIAGNOSIS — R918 Other nonspecific abnormal finding of lung field: Secondary | ICD-10-CM | POA: Diagnosis not present

## 2024-02-27 DIAGNOSIS — I10 Essential (primary) hypertension: Secondary | ICD-10-CM | POA: Diagnosis not present

## 2024-02-27 DIAGNOSIS — J439 Emphysema, unspecified: Secondary | ICD-10-CM | POA: Diagnosis not present

## 2024-02-27 DIAGNOSIS — Q2547 Right aortic arch: Secondary | ICD-10-CM | POA: Diagnosis not present

## 2024-02-27 MED ORDER — ONDANSETRON 4 MG PO TBDP: 4.0000 mg | ORAL_TABLET | Freq: Three times a day (TID) | ORAL | 0 refills | Status: DC | PRN

## 2024-02-27 MED ORDER — IPRATROPIUM-ALBUTEROL 0.5-2.5 (3) MG/3ML IN SOLN
RESPIRATORY_TRACT | Status: AC
  Filled 2024-02-27: qty 3

## 2024-02-27 MED ORDER — ALBUTEROL SULFATE (2.5 MG/3ML) 0.083% IN NEBU: 2.5000 mg | INHALATION_SOLUTION | Freq: Four times a day (QID) | RESPIRATORY_TRACT | 12 refills | Status: AC | PRN

## 2024-02-27 MED ORDER — IPRATROPIUM-ALBUTEROL 0.5-2.5 (3) MG/3ML IN SOLN
3.0000 mL | Freq: Once | RESPIRATORY_TRACT | Status: AC
  Administered 2024-02-27: 3 mL via RESPIRATORY_TRACT

## 2024-02-27 MED ORDER — OFLOXACIN 0.3 % OT SOLN: 10.0000 [drp] | Freq: Every day | OTIC | 0 refills | Status: AC

## 2024-02-27 MED ORDER — DEXAMETHASONE SODIUM PHOSPHATE 10 MG/ML IJ SOLN
INTRAMUSCULAR | Status: AC
  Filled 2024-02-27: qty 1

## 2024-02-27 MED ORDER — DEXAMETHASONE SODIUM PHOSPHATE 10 MG/ML IJ SOLN
10.0000 mg | Freq: Once | INTRAMUSCULAR | Status: AC
  Administered 2024-02-27: 10 mg via INTRAMUSCULAR

## 2024-02-27 MED ORDER — ONDANSETRON 4 MG PO TBDP
4.0000 mg | ORAL_TABLET | Freq: Once | ORAL | Status: AC
  Administered 2024-02-27: 4 mg via ORAL

## 2024-02-27 MED ORDER — ONDANSETRON 4 MG PO TBDP
ORAL_TABLET | ORAL | Status: AC
  Filled 2024-02-27: qty 1

## 2024-02-27 NOTE — ED Provider Notes (Addendum)
 [facility]    CSN: [id number] Arrival date & time: 02/27/24  1514      History   Chief Complaint No chief complaint on file.   HPI [name] is a 43 y.o. female.   [name] is a 43 y.o. female presenting for chief complaint of cough, nasal congestion, generalized fatigue bilateral ear itching, and scratchy throat that started 7 days ago.  She reports shortness of breath and wheezing associated with cough.  History of asthma, using Breztri inhaler as well as albuterol inhaler with slight temporary relief of shortness of breath.  Cough is mostly dry and nonproductive.  Sore throat is worsened by coughing and swallowing.  Denies recent fevers and chills.  She did an e-visit 2 days ago where she was prescribed doxycycline, prednisone, Tessalon Perles, and Promethazine DM.  She took her first dose of the medication this morning and states she threw all of it up due to nausea and vomiting.  She is minimally nauseous currently.  Denies abdominal pain, diarrhea, rash, dizziness, headache, chest pain, leg swelling, orthopnea, and heart palpitations.  Taking many different over-the-counter medications without any relief.     Past Medical History:  Diagnosis Date   Anxiety    Asthma    Back pain, chronic    Depression    hx of    Dysrhythmia 12/06/2011   hx of a-fib   Ectopic pregnancy    GERD (gastroesophageal reflux disease)    Headache    occasional    Hypertension    was told, but never on meds   Infection    UTI   Ovarian cyst    Urinary tract infection     Patient Active Problem List   Diagnosis Date Noted   Sprain of unspecified ligament of right ankle, initial encounter 12/13/2023   Loss of appetite 08/03/2023   Diarrhea 05/09/2023   Gastroesophageal reflux disease 02/28/2023   Change in bowel habits 02/28/2023   Chronic anticoagulation 02/28/2023   A-fib (HCC) 07/18/2022   Nausea and vomiting 07/17/2022   Hyperglycemia 07/17/2022    Epigastric abdominal pain 07/17/2022   Hiatal hernia 07/17/2022   Closed fracture of right ankle 04/13/2018   Ileus (HCC) 03/28/2015   Incarcerated ventral hernia s/p lap repair w mesh 03/25/2014 03/26/2015   Umbilical hernia s/p lap repair w mesh 03/25/2014 03/26/2015   Bipolar disorder, unspecified (HCC) 09/01/2014   Back muscle spasm 09/01/2014   Smoking 09/01/2014   Elevated BP 09/01/2014   PAF (paroxysmal atrial fibrillation) (HCC) 01/13/2013   Essential hypertension 01/13/2013    Past Surgical History:  Procedure Laterality Date   CLEFT PALATE REPAIR     etopical surgery     LAPAROSCOPY FOR ECTOPIC PREGNANCY  2011   TYMPANOPLASTY Right 08/03/2015   Procedure: RIGHT MEDIAL TECHNIQUE TYMPANOPLASTY;  Surgeon: Flo Shanks, MD;  Location: [facility];  Service: ENT;  Laterality: Right;   VENTRAL HERNIA REPAIR N/A 03/26/2015   Procedure: LAPAROSCOPIC REPAIR OF INCARCERATED SUPERUMBILICAL HERNIA X2 AND UMBILICAL HERNIA REPAIR WITH MESH;  Surgeon: Karie Soda, MD;  Location: [facility];  Service: General;  Laterality: N/A;    OB History     Gravida  2   Para      Term      Preterm      AB  2   Living  0      SAB  1   IAB  0   Ectopic  1   Multiple  0   Live Births               Home Medications    Prior to Admission medications   Medication Sig Start Date End Date Taking? Authorizing Provider  albuterol (PROVENTIL) (2.5 MG/3ML) 0.083% nebulizer solution Take 3 mLs (2.5 mg total) by nebulization every 6 (six) hours as needed for wheezing or shortness of breath. 02/27/24  Yes Man Bonneau, Donavan Burnet, FNP  ofloxacin (FLOXIN) 0.3 % OTIC solution Place 10 drops into the left ear daily for 7 days. 02/27/24 03/05/24 Yes Carlisle Beers, FNP  ondansetron (ZOFRAN-ODT) 4 MG disintegrating tablet Take 1 tablet (4 mg total) by mouth every 8 (eight) hours as needed for nausea or vomiting. 02/27/24  Yes Carlisle Beers, FNP  albuterol (VENTOLIN HFA) 108  (90 Base) MCG/ACT inhaler Inhale 2 puffs into the lungs every 4 (four) hours as needed.    [provider]  benzonatate (TESSALON) 100 MG capsule Take 1-2 capsules (100-200 mg total) by mouth 3 (three) times daily as needed. 02/26/24   Margaretann Loveless, PA-C  cloNIDine (CATAPRES) 0.1 MG tablet Take 0.1 mg by mouth at bedtime. 04/11/23   [provider]  diltiazem (CARDIZEM CD) 180 MG 24 hr capsule Take 1 capsule (180 mg total) by mouth daily. 12/28/23   Quintella Reichert, MD  doxycycline (VIBRA-TABS) 100 MG tablet Take 1 tablet (100 mg total) by mouth 2 (two) times daily. 02/26/24   Margaretann Loveless, PA-C  famotidine (PEPCID) 20 MG tablet TAKE 1 TABLET BY MOUTH DAILY AT BEDTIME 01/23/24   Zehr, Shanda Bumps D, PA-C  Fluticasone-Salmeterol (ADVAIR HFA IN) Inhale 2 puffs into the lungs daily.    [provider]  gabapentin (NEURONTIN) 300 MG capsule Take 1 capsule (300 mg total) by mouth at bedtime. 11/30/23   Reva Bores, MD  HYDROcodone-acetaminophen (NORCO/VICODIN) 5-325 MG tablet Take 1 tablet by mouth every 4 (four) hours as needed for moderate pain (pain score 4-6). 12/13/23 12/12/24  Persons, [place], PA  ipratropium (ATROVENT) 0.03 % nasal spray Place 2 sprays into both nostrils every 12 (twelve) hours. 02/26/24   Margaretann Loveless, PA-C  mirtazapine (REMERON) 15 MG tablet Take 1 tablet (15 mg total) by mouth at bedtime. 08/02/23   Zehr, Princella Pellegrini, PA-C  mirtazapine (REMERON) 30 MG tablet Take 30 mg by mouth at bedtime. 09/12/23   [provider]  montelukast (SINGULAIR) 10 MG tablet Take 10 mg by mouth daily. 06/27/22   [provider]  nicotine (NICODERM CQ - DOSED IN MG/24 HOURS) 14 mg/24hr patch 14 mg daily.    [provider]  nicotine polacrilex (COMMIT) 4 MG lozenge 1 lozenge as needed Mouth/Throat up to 10 time(s) a day as needed for cravings    [provider]  NICOTINE STEP 3 7 MG/24HR patch Place 7 mg onto the skin daily.     [provider]  pantoprazole (PROTONIX) 40 MG tablet Take 1 tablet (40 mg total) by mouth daily. 07/18/22 10/05/23  Thomas Hoff, MD  predniSONE (DELTASONE) 20 MG tablet Take 2 tablets (40 mg total) by mouth daily with breakfast. 02/26/24   Margaretann Loveless, PA-C  promethazine-dextromethorphan (PROMETHAZINE-DM) 6.25-15 MG/5ML syrup Take 5 mLs by mouth 4 (four) times daily as needed. 02/26/24   Margaretann Loveless, PA-C  rivaroxaban (XARELTO) 20 MG TABS tablet Take 1 tablet (20 mg total) by mouth daily with supper. 12/28/23   Quintella Reichert, MD  zolpidem (AMBIEN) 10 MG  tablet Take 10 mg by mouth at bedtime as needed. 05/15/23   [provider]    Family History Family History  Problem Relation Age of Onset   Hypertension Mother    Hypertension Father    Cancer Sister    Heart disease Maternal Grandmother    Cancer Maternal Grandfather    Colon cancer Maternal Grandfather    Other Neg Hx    Hearing loss Neg Hx    Rectal cancer Neg Hx    Stomach cancer Neg Hx    Esophageal cancer Neg Hx     Social History Social History   Tobacco Use   Smoking status: Former    Current packs/day: 0.25    Average packs/day: 0.3 packs/day for 15.0 years (3.8 ttl pk-yrs)    Types: Cigarettes   Smokeless tobacco: Never  Vaping Use   Vaping status: Never Used  Substance Use Topics   Alcohol use: Not Currently    Alcohol/week: 1.0 standard drink of alcohol    Types: 1 Standard drinks or equivalent per week    Comment: social   Drug use: No     Allergies   Flexeril [cyclobenzaprine], Lamotrigine, and Tramadol   Review of Systems Review of Systems Per HPI  Physical Exam Triage Vital Signs ED Triage Vitals  Encounter Vitals Group     BP 02/27/24 1604 (!) 140/93     Systolic BP Percentile --      Diastolic BP Percentile --      Pulse Rate 02/27/24 1604 92     Resp 02/27/24 1604 17     Temp 02/27/24 1604 98.8 F (37.1 C)     Temp Source 02/27/24 1604 Oral     SpO2  02/27/24 1604 97 %     Weight --      Height --      Head Circumference --      Peak Flow --      Pain Score 02/27/24 1603 0     Pain Loc --      Pain Education --      Exclude from Growth Chart --    No data found.  Updated Vital Signs BP (!) 140/93 (BP Location: Left Arm)   Pulse 92   Temp 98.8 F (37.1 C) (Oral)   Resp 17   LMP 02/26/2015   SpO2 97%   Visual Acuity Right Eye Distance:   Left Eye Distance:   Bilateral Distance:    Right Eye Near:   Left Eye Near:    Bilateral Near:     Physical Exam Vitals and nursing note reviewed.  Constitutional:      Appearance: She is not ill-appearing or toxic-appearing.  HENT:     Head: Normocephalic and atraumatic.     Jaw: There is normal jaw occlusion.     Right Ear: Hearing, tympanic membrane, ear canal and external ear normal. Drainage (Thick purulent drainage to the right ear canal, TM intact.) present.     Left Ear: Hearing, tympanic membrane, ear canal and external ear normal.     Nose: Congestion present.     Mouth/Throat:     Lips: Pink.     Mouth: Mucous membranes are moist. No injury or oral lesions.     Dentition: Normal dentition.     Tongue: No lesions.     Pharynx: Oropharynx is clear. Uvula midline. No pharyngeal swelling, oropharyngeal exudate, posterior oropharyngeal erythema, uvula swelling or postnasal drip.     Tonsils: No  tonsillar exudate.  Eyes:     General: Lids are normal. Vision grossly intact. Gaze aligned appropriately.     Extraocular Movements: Extraocular movements intact.     Conjunctiva/sclera: Conjunctivae normal.  Neck:     Trachea: Trachea and phonation normal.  Cardiovascular:     Rate and Rhythm: Normal rate and regular rhythm.     Heart sounds: Normal heart sounds, S1 normal and S2 normal.  Pulmonary:     Effort: Pulmonary effort is normal. No respiratory distress.     Breath sounds: Normal air entry. Examination of the left-lower field reveals wheezing. Decreased breath  sounds and wheezing present. No rhonchi or rales.     Comments: Diminished breath sounds throughout. Left lower wheezing on exam. Harsh and dry cough on exam.  Musculoskeletal:     Cervical back: Neck supple.  Lymphadenopathy:     Cervical: No cervical adenopathy.  Skin:    General: Skin is warm and dry.     Capillary Refill: Capillary refill takes less than 2 seconds.     Findings: No rash.  Neurological:     General: No focal deficit present.     Mental Status: She is alert and oriented to person, place, and time. Mental status is at baseline.     Cranial Nerves: No dysarthria or facial asymmetry.  Psychiatric:        Mood and Affect: Mood normal.        Speech: Speech normal.        Behavior: Behavior normal.        Thought Content: Thought content normal.        Judgment: Judgment normal.      UC Treatments / Results  Labs (all labs ordered are listed, but only abnormal results are displayed) Labs Reviewed - No data to display  EKG   Radiology DG Chest 2 View Result Date: 02/27/2024 CLINICAL DATA:  Cough. EXAM: CHEST - 2 VIEW COMPARISON:  Chest x-ray 04/21/2021.  Chest CT 09/08/2022. FINDINGS: The heart size and mediastinal contours are within normal limits. Right-sided aortic arch is again seen. Both lungs are clear. There are prominent central bronchovascular markings bilaterally. The visualized skeletal structures are unremarkable. IMPRESSION: 1. Prominent central bronchovascular markings bilaterally may reflect bronchiolitis or reactive airways disease. 2. Right-sided aortic arch. Electronically Signed   By: Darliss Cheney M.D.   On: 02/27/2024 17:48    Procedures Procedures (including critical care time)  Medications Ordered in UC Medications  ipratropium-albuterol (DUONEB) 0.5-2.5 (3) MG/3ML nebulizer solution 3 mL (3 mLs Nebulization Given 02/27/24 1633)  dexamethasone (DECADRON) injection 10 mg (10 mg Intramuscular Given 02/27/24 1634)  ondansetron (ZOFRAN-ODT)  disintegrating tablet 4 mg (4 mg Oral Given 02/27/24 1633)    Initial Impression / Assessment and Plan / UC Course  I have reviewed the triage vital signs and the nursing notes.  Pertinent labs & imaging results that were available during my care of the patient were reviewed by me and considered in my medical decision making (see chart for details).   1. Moderate persistent asthma with acute exacerbation, shortness of breath, acute cough Evaluation suggests asthma exacerbation likely triggered by viral URI.  Will treat with steroid, bronchodilator, cough suppressants for symptomatic relief, and expectorants (mucinex) as needed.  Imaging: chest x-ray unremarkable for signs of pneumonia/acute cardiopulmonary abnormality and shows endings consistent with reactive airway disease/bronchitis.  Interventions in clinic: breathing treatment improved shortness of breath and lung sounds on reassessment prior to discharge.   Wheezing fully  resolved, breath sounds normal throughout on reassessment. Zofran every 8 hours as needed for nausea and vomiting.  Dose given in clinic with significant improvement in nausea prior to discharge.  Discussed PCP follow-up to discuss asthma action plan to prevent future asthma exacerbations.   2.  Otitis externa Ofloxacin ordered to treat right otitis externa. Advised to avoid getting water into the ear canal.  May follow-up with PCP for ongoing evaluation.  Counseled patient on potential for adverse effects with medications prescribed/recommended today, strict ER and return-to-clinic precautions discussed, patient verbalized understanding.    Final Clinical Impressions(s) / UC Diagnoses   Final diagnoses:  Shortness of breath  Mild intermittent asthma with (acute) exacerbation     Discharge Instructions      Your viral upper respiratory infection has triggered your asthma. Your chest x-ray shows signs of asthma exacerbation without evidence of pneumonia.   Staff will call if the radiologist reread shows abnormality requiring change in treatment plan.  We will treat this with steroids. Start taking the prednisone prescribed at your visit tomorrow as prescribed (once daily for 5 days). Do not take any NSAIDs with steroid pills (no ibuprofen, naproxen while taking steroid, this could cause stomach upset).   Use albuterol nebulizer every 4-6 hours as needed for cough, shortness of breath, and wheezing.  Continue using Breztri inhaler.  Use Zofran every 8 hours as needed for nausea and vomiting.  Continue use of Promethazine DM at bedtime as needed for cough and congestion.  Use ofloxacin eardrops as prescribed to treat external ear infection.  If you develop any new or worsening symptoms or if your symptoms do not start to improve, please return here or follow-up with your primary care provider. If your symptoms are severe, please go to the emergency room.      ED Prescriptions     Medication Sig Dispense Auth. Provider   albuterol (PROVENTIL) (2.5 MG/3ML) 0.083% nebulizer solution Take 3 mLs (2.5 mg total) by nebulization every 6 (six) hours as needed for wheezing or shortness of breath. 75 mL Reita May M, FNP   ofloxacin (FLOXIN) 0.3 % OTIC solution Place 10 drops into the left ear daily for 7 days. 5 mL Reita May M, FNP   ondansetron (ZOFRAN-ODT) 4 MG disintegrating tablet Take 1 tablet (4 mg total) by mouth every 8 (eight) hours as needed for nausea or vomiting. 20 tablet Carlisle Beers, FNP      PDMP not reviewed this encounter.   Carlisle Beers, FNP 02/27/24 1800    Carlisle Beers, FNP 02/27/24 1801

## 2024-02-27 NOTE — ED Triage Notes (Signed)
 Pt having sinus pressure-headache, runny nose, decreased appetite for a week. no sleep in two days. Having lots of pressure and itching in bilateral ears.  Did e-visit yesterday and prescribed medications and vomited them up this morning. Had tried Delsym, Mucinex DM, Claritin, allegra al did not help before e-visit yesterday.

## 2024-02-27 NOTE — Discharge Instructions (Addendum)
 Your viral upper respiratory infection has triggered your asthma. Your chest x-ray shows signs of asthma exacerbation without evidence of pneumonia.  Staff will call if the radiologist reread shows abnormality requiring change in treatment plan.  We will treat this with steroids. Start taking the prednisone prescribed at your visit tomorrow as prescribed (once daily for 5 days). Do not take any NSAIDs with steroid pills (no ibuprofen, naproxen while taking steroid, this could cause stomach upset).   Use albuterol nebulizer every 4-6 hours as needed for cough, shortness of breath, and wheezing.  Continue using Breztri inhaler.  Use Zofran every 8 hours as needed for nausea and vomiting.  Continue use of Promethazine DM at bedtime as needed for cough and congestion.  Use ofloxacin eardrops as prescribed to treat external ear infection.  If you develop any new or worsening symptoms or if your symptoms do not start to improve, please return here or follow-up with your primary care provider. If your symptoms are severe, please go to the emergency room.

## 2024-03-04 ENCOUNTER — Telehealth: Payer: Self-pay | Admitting: *Deleted

## 2024-03-04 NOTE — Telephone Encounter (Signed)
 Received FAX from [facility] that pt returned her cpap, refusal of service. Personnel from [facility] have informed pt of the potential dangers / consequences to myself due to the refusal/removal of said equipment. With this knowledge, it releases [facility] and attending MD frm any or all responsibility for any consequences associated with or created by the removal of said equipment done so with out medical approval.

## 2024-03-05 ENCOUNTER — Ambulatory Visit: Payer: Medicaid Other | Attending: Cardiology | Admitting: Cardiology

## 2024-03-06 ENCOUNTER — Inpatient Hospital Stay: Admission: RE | Admit: 2024-03-06 | Payer: Medicaid Other | Source: Ambulatory Visit

## 2024-03-06 ENCOUNTER — Other Ambulatory Visit: Payer: Self-pay | Admitting: Obstetrics and Gynecology

## 2024-03-06 DIAGNOSIS — E2839 Other primary ovarian failure: Secondary | ICD-10-CM

## 2024-03-07 ENCOUNTER — Encounter: Payer: Self-pay | Admitting: Cardiology

## 2024-03-08 ENCOUNTER — Ambulatory Visit (HOSPITAL_COMMUNITY): Admission: EM | Admit: 2024-03-08 | Discharge: 2024-03-08 | Disposition: A | Discharge status: Home / Self Care

## 2024-03-08 ENCOUNTER — Ambulatory Visit (HOSPITAL_COMMUNITY)

## 2024-03-08 ENCOUNTER — Encounter (HOSPITAL_COMMUNITY): Payer: Self-pay

## 2024-03-08 DIAGNOSIS — J4521 Mild intermittent asthma with (acute) exacerbation: Secondary | ICD-10-CM

## 2024-03-08 DIAGNOSIS — H66001 Acute suppurative otitis media without spontaneous rupture of ear drum, right ear: Secondary | ICD-10-CM

## 2024-03-08 DIAGNOSIS — K0889 Other specified disorders of teeth and supporting structures: Secondary | ICD-10-CM | POA: Diagnosis not present

## 2024-03-08 MED ORDER — KETOROLAC TROMETHAMINE 60 MG/2ML IM SOLN
30.0000 mg | Freq: Once | INTRAMUSCULAR | Status: AC
  Administered 2024-03-08: 30 mg via INTRAMUSCULAR

## 2024-03-08 MED ORDER — DEXAMETHASONE SODIUM PHOSPHATE 10 MG/ML IJ SOLN
INTRAMUSCULAR | Status: AC
  Filled 2024-03-08: qty 1

## 2024-03-08 MED ORDER — IBUPROFEN 800 MG PO TABS: 800.0000 mg | ORAL_TABLET | Freq: Three times a day (TID) | ORAL | 0 refills | Status: DC

## 2024-03-08 MED ORDER — CLINDAMYCIN HCL 300 MG PO CAPS: 300.0000 mg | ORAL_CAPSULE | Freq: Three times a day (TID) | ORAL | 0 refills | Status: AC

## 2024-03-08 MED ORDER — KETOROLAC TROMETHAMINE 30 MG/ML IJ SOLN
INTRAMUSCULAR | Status: AC
  Filled 2024-03-08: qty 1

## 2024-03-08 MED ORDER — DEXAMETHASONE SODIUM PHOSPHATE 10 MG/ML IJ SOLN
10.0000 mg | Freq: Once | INTRAMUSCULAR | Status: AC
  Administered 2024-03-08: 10 mg via INTRAMUSCULAR

## 2024-03-08 NOTE — ED Provider Notes (Signed)
 [facility]    CSN: [id number] Arrival date & time: 03/08/24  8295      History   Chief Complaint Chief Complaint  Patient presents with   Facial Pain    Right sided facial pain, right sided ear pain and jaw pain    HPI [name] is a 43 y.o. female.   Patient presents to clinic for multiple concerns.  Has been having right sided ear pain that has been ongoing for the past week and feels like the area in front of her ear is swollen.  She was seen at this clinic on 3/25 where she was started on ofloxacin drops, finished these recently.  Is also having right sided dental pain.  Is unable to chew, eat hot or cold foods or have air exposed to the side of her mouth without extreme pain.  She contacted her primary care provider and she had been placed on Augmentin.  She has been taking this for the past 3 days without any improvement.  Has not tried any Tylenol or ibuprofen for pain.  Continues to have ongoing cough, sore throat, wheezing and shortness of breath.  Was seen here on 3/25 where she had recently completed oral steroids for an asthma exacerbation.  She was given IM steroids at this visit.  Has been taking Promethazine DM but that her cough continues.  The history is provided by the patient and medical records.    Past Medical History:  Diagnosis Date   Anxiety    Asthma    Back pain, chronic    Depression    hx of    Dysrhythmia 12/06/2011   hx of a-fib   Ectopic pregnancy    GERD (gastroesophageal reflux disease)    Headache    occasional    Hypertension    was told, but never on meds   Infection    UTI   Ovarian cyst    Urinary tract infection     Patient Active Problem List   Diagnosis Date Noted   Sprain of unspecified ligament of right ankle, initial encounter 12/13/2023   Loss of appetite 08/03/2023   Diarrhea 05/09/2023   Gastroesophageal reflux disease 02/28/2023   Change in bowel habits 02/28/2023   Chronic anticoagulation  02/28/2023   A-fib (HCC) 07/18/2022   Nausea and vomiting 07/17/2022   Hyperglycemia 07/17/2022   Epigastric abdominal pain 07/17/2022   Hiatal hernia 07/17/2022   Closed fracture of right ankle 04/13/2018   Ileus (HCC) 03/28/2015   Incarcerated ventral hernia s/p lap repair w mesh 03/25/2014 03/26/2015   Umbilical hernia s/p lap repair w mesh 03/25/2014 03/26/2015   Bipolar disorder, unspecified (HCC) 09/01/2014   Back muscle spasm 09/01/2014   Smoking 09/01/2014   Elevated BP 09/01/2014   PAF (paroxysmal atrial fibrillation) (HCC) 01/13/2013   Essential hypertension 01/13/2013    Past Surgical History:  Procedure Laterality Date   CLEFT PALATE REPAIR     etopical surgery     LAPAROSCOPY FOR ECTOPIC PREGNANCY  2011   TYMPANOPLASTY Right 08/03/2015   Procedure: RIGHT MEDIAL TECHNIQUE TYMPANOPLASTY;  Surgeon: Flo Shanks, MD;  Location: [facility];  Service: ENT;  Laterality: Right;   VENTRAL HERNIA REPAIR N/A 03/26/2015   Procedure: LAPAROSCOPIC REPAIR OF INCARCERATED SUPERUMBILICAL HERNIA X2 AND UMBILICAL HERNIA REPAIR WITH MESH;  Surgeon: Karie Soda, MD;  Location: [facility];  Service: General;  Laterality: N/A;    OB History     Gravida  2  Para      Term      Preterm      AB  2   Living  0      SAB  1   IAB  0   Ectopic  1   Multiple  0   Live Births               Home Medications    Prior to Admission medications   Medication Sig Start Date End Date Taking? Authorizing Provider  albuterol (PROVENTIL) (2.5 MG/3ML) 0.083% nebulizer solution Take 3 mLs (2.5 mg total) by nebulization every 6 (six) hours as needed for wheezing or shortness of breath. 02/27/24  Yes Stanhope, Donavan Burnet, FNP  albuterol (VENTOLIN HFA) 108 (90 Base) MCG/ACT inhaler Inhale 2 puffs into the lungs every 4 (four) hours as needed.   Yes [provider]  amoxicillin-clavulanate (AUGMENTIN) 875-125 MG tablet 1 tablet Orally every 12 hrs for 7 days 03/05/24   Yes [provider]  clindamycin (CLEOCIN) 300 MG capsule Take 1 capsule (300 mg total) by mouth 3 (three) times daily for 7 days. 03/08/24 03/15/24 Yes Rinaldo Ratel, [place] N, FNP  cloNIDine (CATAPRES) 0.1 MG tablet Take 0.1 mg by mouth at bedtime. 04/11/23  Yes [provider]  diltiazem (CARDIZEM CD) 180 MG 24 hr capsule Take 1 capsule (180 mg total) by mouth daily. 12/28/23  Yes Turner, Cornelious Bryant, MD  famotidine (PEPCID) 20 MG tablet TAKE 1 TABLET BY MOUTH DAILY AT BEDTIME 01/23/24  Yes Zehr, Jessica D, PA-C  Fluticasone-Salmeterol (ADVAIR HFA IN) Inhale 2 puffs into the lungs daily.   Yes [provider]  gabapentin (NEURONTIN) 300 MG capsule Take 1 capsule (300 mg total) by mouth at bedtime. 11/30/23  Yes Reva Bores, MD  ibuprofen (ADVIL) 800 MG tablet Take 1 tablet (800 mg total) by mouth 3 (three) times daily. 03/08/24  Yes Rinaldo Ratel, [place] N, FNP  ipratropium (ATROVENT) 0.03 % nasal spray Place 2 sprays into both nostrils every 12 (twelve) hours. 02/26/24  Yes Margaretann Loveless, PA-C  mirtazapine (REMERON) 30 MG tablet Take 30 mg by mouth at bedtime. 09/12/23  Yes [provider]  montelukast (SINGULAIR) 10 MG tablet Take 10 mg by mouth daily. 06/27/22  Yes [provider]  nicotine (NICODERM CQ - DOSED IN MG/24 HOURS) 14 mg/24hr patch 14 mg daily.   Yes [provider]  NICOTINE STEP 3 7 MG/24HR patch Place 7 mg onto the skin daily.   Yes [provider]  ondansetron (ZOFRAN-ODT) 4 MG disintegrating tablet Take 1 tablet (4 mg total) by mouth every 8 (eight) hours as needed for nausea or vomiting. 02/27/24  Yes Carlisle Beers, FNP  promethazine-dextromethorphan (PROMETHAZINE-DM) 6.25-15 MG/5ML syrup Take 5 mLs by mouth 4 (four) times daily as needed. 02/26/24  Yes Margaretann Loveless, PA-C  rivaroxaban (XARELTO) 20 MG TABS tablet Take 1 tablet (20 mg total) by mouth daily with supper. 12/28/23  Yes Turner, Cornelious Bryant, MD  zolpidem  (AMBIEN) 10 MG tablet Take 10 mg by mouth at bedtime as needed. 05/15/23  Yes [provider]  benzonatate (TESSALON) 100 MG capsule Take 1-2 capsules (100-200 mg total) by mouth 3 (three) times daily as needed. 02/26/24   Margaretann Loveless, PA-C  doxycycline (VIBRA-TABS) 100 MG tablet Take 1 tablet (100 mg total) by mouth 2 (two) times daily. 02/26/24   Margaretann Loveless, PA-C  HYDROcodone-acetaminophen (NORCO/VICODIN) 5-325 MG tablet Take 1 tablet by mouth every 4 (  four) hours as needed for moderate pain (pain score 4-6). 12/13/23 12/12/24  Persons, [place], PA  mirtazapine (REMERON) 15 MG tablet Take 1 tablet (15 mg total) by mouth at bedtime. 08/02/23   Zehr, Princella Pellegrini, PA-C  nicotine polacrilex (COMMIT) 4 MG lozenge 1 lozenge as needed Mouth/Throat up to 10 time(s) a day as needed for cravings    [provider]  pantoprazole (PROTONIX) 40 MG tablet Take 1 tablet (40 mg total) by mouth daily. 07/18/22 10/05/23  Thomas Hoff, MD  predniSONE (DELTASONE) 20 MG tablet Take 2 tablets (40 mg total) by mouth daily with breakfast. 02/26/24   Margaretann Loveless, PA-C    Family History Family History  Problem Relation Age of Onset   Hypertension Mother    Hypertension Father    Cancer Sister    Heart disease Maternal Grandmother    Cancer Maternal Grandfather    Colon cancer Maternal Grandfather    Other Neg Hx    Hearing loss Neg Hx    Rectal cancer Neg Hx    Stomach cancer Neg Hx    Esophageal cancer Neg Hx     Social History Social History   Tobacco Use   Smoking status: Former    Current packs/day: 0.25    Average packs/day: 0.3 packs/day for 15.0 years (3.8 ttl pk-yrs)    Types: Cigarettes   Smokeless tobacco: Never  Vaping Use   Vaping status: Never Used  Substance Use Topics   Alcohol use: Not Currently    Alcohol/week: 1.0 standard drink of alcohol    Types: 1 Standard drinks or equivalent per week    Comment: social   Drug use: No     Allergies    Flexeril [cyclobenzaprine], Lamotrigine, and Tramadol   Review of Systems Review of Systems  Per HPI  Physical Exam Triage Vital Signs ED Triage Vitals  Encounter Vitals Group     BP 03/08/24 1049 132/73     Systolic BP Percentile --      Diastolic BP Percentile --      Pulse Rate 03/08/24 1049 80     Resp 03/08/24 1049 18     Temp 03/08/24 1049 98.3 F (36.8 C)     Temp Source 03/08/24 1049 Oral     SpO2 03/08/24 1049 96 %     Weight 03/08/24 1048 143 lb (64.9 kg)     Height 03/08/24 1048 5' (1.524 m)     Head Circumference --      Peak Flow --      Pain Score 03/08/24 1048 10     Pain Loc --      Pain Education --      Exclude from Growth Chart --    No data found.  Updated Vital Signs BP 132/73 (BP Location: Right Arm)   Pulse 80   Temp 98.3 F (36.8 C) (Oral)   Resp 18   Ht 5' (1.524 m)   Wt 143 lb (64.9 kg)   LMP 02/26/2015   SpO2 96%   BMI 27.93 kg/m   Visual Acuity Right Eye Distance:   Left Eye Distance:   Bilateral Distance:    Right Eye Near:   Left Eye Near:    Bilateral Near:     Physical Exam Vitals and nursing note reviewed.  Constitutional:      Appearance: Normal appearance.  HENT:     Head: Normocephalic and atraumatic.     Nose: Nose normal.  Mouth/Throat:     Mouth: Mucous membranes are moist.  Eyes:     Conjunctiva/sclera: Conjunctivae normal.  Cardiovascular:     Rate and Rhythm: Normal rate and regular rhythm.     Heart sounds: Normal heart sounds. No murmur heard. Pulmonary:     Effort: Pulmonary effort is normal. No respiratory distress.     Breath sounds: Normal breath sounds.  Neurological:     Mental Status: She is alert.      UC Treatments / Results  Labs (all labs ordered are listed, but only abnormal results are displayed) Labs Reviewed - No data to display  EKG   Radiology No results found.  Procedures Procedures (including critical care time)  Medications Ordered in UC Medications   ketorolac (TORADOL) injection 30 mg (30 mg Intramuscular Given 03/08/24 1126)  dexamethasone (DECADRON) injection 10 mg (10 mg Intramuscular Given 03/08/24 1126)    Initial Impression / Assessment and Plan / UC Course  I have reviewed the triage vital signs and the nursing notes.  Pertinent labs & imaging results that were available during my care of the patient were reviewed by me and considered in my medical decision making (see chart for details).  Vitals in triage reviewed, patient is hemodynamically stable.  Lungs are vesicular, or with a good rate and rhythm.  Right-sided tympanic membrane is erythematous and bulging.  No signs of otitis externa at this time.  Multiple filled cavities on dental examination.  No obvious abscess or broken tooth.  She did see a dentist recently and had a tooth pulled and was told she needed another one pulled on this side but did not get that completed yet.  Concern over dental infection not improving with Augmentin, will cover with clindamycin.  For pain management will give IM Toradol.  IM steroid for continued asthma exacerbation with her wheezing and shortness of breath.  Oxygenation is reassuring and will withhold another taper of oral steroids.   Plan of care, follow-up care return precautions given, no questions at this time.     Final Clinical Impressions(s) / UC Diagnoses   Final diagnoses:  Mild intermittent asthma with acute exacerbation  Pain, dental  Acute suppurative otitis media of right ear without spontaneous rupture of tympanic membrane, recurrence not specified     Discharge Instructions      You may have a dental infection, start taking the clindamycin as prescribed with food.  Avoid hot or cold foods and chewing on the side.  Please follow-up with your dentist on Monday for reevaluation.  You can take 800 milligrams of ibuprofen every 8 hours starting tomorrow for any continued pain and inflammation.  I suggest continuing out  the Augmentin as this will cover for sinusitis, otitis media and other common upper respiratory pathogens.  Follow-up with your primary care provider if your pain persists.  Seek immediate care at the nearest emergency department if your pain remains severe, or you have any new concerning symptoms.      ED Prescriptions     Medication Sig Dispense Auth. Provider   clindamycin (CLEOCIN) 300 MG capsule Take 1 capsule (300 mg total) by mouth 3 (three) times daily for 7 days. 21 capsule Rinaldo Ratel, [place] N, [place]   ibuprofen (ADVIL) 800 MG tablet Take 1 tablet (800 mg total) by mouth 3 (three) times daily. 21 tablet Oma Marzan, [place], [place]      I have reviewed the PDMP during this encounter.   Clayten Allcock, [place] N, [place] 03/08/24 [phone]

## 2024-03-08 NOTE — Discharge Instructions (Addendum)
 You may have a dental infection, start taking the clindamycin as prescribed with food.  Avoid hot or cold foods and chewing on the side.  Please follow-up with your dentist on Monday for reevaluation.  You can take 800 milligrams of ibuprofen every 8 hours starting tomorrow for any continued pain and inflammation.  I suggest continuing out the Augmentin as this will cover for sinusitis, otitis media and other common upper respiratory pathogens.  Follow-up with your primary care provider if your pain persists.  Seek immediate care at the nearest emergency department if your pain remains severe, or you have any new concerning symptoms.

## 2024-03-08 NOTE — ED Triage Notes (Signed)
 Pt states that she has some right ear pain x1 week Pt states that she has some right sided jaw pain as well x2 days Pt states that she still has a cough and scratchy throat. X1 week   Pt states that she has been here for the same symptoms recently.

## 2024-03-16 DIAGNOSIS — Z419 Encounter for procedure for purposes other than remedying health state, unspecified: Secondary | ICD-10-CM | POA: Diagnosis not present

## 2024-03-21 DIAGNOSIS — F419 Anxiety disorder, unspecified: Secondary | ICD-10-CM | POA: Diagnosis not present

## 2024-03-22 ENCOUNTER — Ambulatory Visit: Admitting: Family Medicine

## 2024-04-07 ENCOUNTER — Encounter (HOSPITAL_BASED_OUTPATIENT_CLINIC_OR_DEPARTMENT_OTHER): Payer: Self-pay | Admitting: Emergency Medicine

## 2024-04-07 ENCOUNTER — Other Ambulatory Visit: Payer: Self-pay

## 2024-04-07 ENCOUNTER — Emergency Department (HOSPITAL_BASED_OUTPATIENT_CLINIC_OR_DEPARTMENT_OTHER)

## 2024-04-07 ENCOUNTER — Emergency Department (HOSPITAL_BASED_OUTPATIENT_CLINIC_OR_DEPARTMENT_OTHER)
Admission: EM | Admit: 2024-04-07 | Discharge: 2024-04-07 | Disposition: A | Discharge status: Home / Self Care | Attending: Emergency Medicine | Admitting: Emergency Medicine

## 2024-04-07 DIAGNOSIS — I480 Paroxysmal atrial fibrillation: Secondary | ICD-10-CM | POA: Diagnosis not present

## 2024-04-07 DIAGNOSIS — R059 Cough, unspecified: Secondary | ICD-10-CM | POA: Diagnosis not present

## 2024-04-07 DIAGNOSIS — K439 Ventral hernia without obstruction or gangrene: Secondary | ICD-10-CM | POA: Diagnosis not present

## 2024-04-07 DIAGNOSIS — I10 Essential (primary) hypertension: Secondary | ICD-10-CM | POA: Diagnosis not present

## 2024-04-07 DIAGNOSIS — R1032 Left lower quadrant pain: Secondary | ICD-10-CM | POA: Diagnosis not present

## 2024-04-07 DIAGNOSIS — Z7901 Long term (current) use of anticoagulants: Secondary | ICD-10-CM | POA: Insufficient documentation

## 2024-04-07 DIAGNOSIS — Z79899 Other long term (current) drug therapy: Secondary | ICD-10-CM | POA: Insufficient documentation

## 2024-04-07 DIAGNOSIS — R051 Acute cough: Secondary | ICD-10-CM | POA: Diagnosis not present

## 2024-04-07 DIAGNOSIS — K219 Gastro-esophageal reflux disease without esophagitis: Secondary | ICD-10-CM | POA: Diagnosis not present

## 2024-04-07 LAB — CBC
HCT: 41.9 % (ref 36.0–46.0)
Hemoglobin: 14.1 g/dL (ref 12.0–15.0)
MCH: 29.8 pg (ref 26.0–34.0)
MCHC: 33.7 g/dL (ref 30.0–36.0)
MCV: 88.6 fL (ref 80.0–100.0)
Platelets: 367 10*3/uL (ref 150–400)
RBC: 4.73 MIL/uL (ref 3.87–5.11)
RDW: 13.7 % (ref 11.5–15.5)
WBC: 9 10*3/uL (ref 4.0–10.5)
nRBC: 0 % (ref 0.0–0.2)

## 2024-04-07 LAB — HCG, QUANTITATIVE, PREGNANCY: hCG, Beta Chain, Quant, S: 2 m[IU]/mL (ref <5)

## 2024-04-07 LAB — COMPREHENSIVE METABOLIC PANEL WITH GFR
ALT: 14 U/L (ref 0–44)
AST: 26 U/L (ref 15–41)
Albumin: 4.2 g/dL (ref 3.5–5.0)
Alkaline Phosphatase: 70 U/L (ref 38–126)
Anion gap: 10 (ref 5–15)
BUN: 11 mg/dL (ref 6–20)
CO2: 26 mmol/L (ref 22–32)
Calcium: 9.6 mg/dL (ref 8.9–10.3)
Chloride: 105 mmol/L (ref 98–111)
Creatinine, Ser: 0.58 mg/dL (ref 0.44–1.00)
GFR, Estimated: >60 mL/min (ref >60)
Glucose, Bld: 88 mg/dL (ref 70–99)
Potassium: 4.8 mmol/L (ref 3.5–5.1)
Sodium: 141 mmol/L (ref 135–145)
Total Bilirubin: 0.2 mg/dL (ref 0.0–1.2)
Total Protein: 6.8 g/dL (ref 6.5–8.1)

## 2024-04-07 LAB — URINALYSIS, ROUTINE W REFLEX MICROSCOPIC
Bilirubin Urine: NEGATIVE
Glucose, UA: NEGATIVE mg/dL
Hgb urine dipstick: NEGATIVE
Ketones, ur: NEGATIVE mg/dL
Leukocytes,Ua: NEGATIVE
Nitrite: NEGATIVE
Protein, ur: NEGATIVE mg/dL
Specific Gravity, Urine: 1.029 (ref 1.005–1.030)
pH: 7 (ref 5.0–8.0)

## 2024-04-07 LAB — LIPASE, BLOOD: Lipase: 28 U/L (ref 11–51)

## 2024-04-07 MED ORDER — HYDROCODONE BIT-HOMATROP MBR 5-1.5 MG/5ML PO SOLN: 5.0000 mL | Freq: Four times a day (QID) | ORAL | 0 refills | Status: DC | PRN

## 2024-04-07 MED ORDER — IOHEXOL 300 MG/ML  SOLN
100.0000 mL | Freq: Once | INTRAMUSCULAR | Status: AC | PRN
  Administered 2024-04-07: 100 mL via INTRAVENOUS

## 2024-04-07 MED ORDER — HYDROCODONE-ACETAMINOPHEN 5-325 MG PO TABS
1.0000 | ORAL_TABLET | Freq: Once | ORAL | Status: AC
  Administered 2024-04-07: 1 via ORAL
  Filled 2024-04-07: qty 1

## 2024-04-07 MED ORDER — IBUPROFEN 800 MG PO TABS: 800.0000 mg | ORAL_TABLET | Freq: Three times a day (TID) | ORAL | 0 refills | Status: DC | PRN

## 2024-04-07 NOTE — ED Triage Notes (Signed)
 Patient coming to ED for evaluation lower left sided abdominal pain.  No complaints of diarrhea or nausea.  Has had decreased appetite.  No fevers or blood noted in stool.  Hx of hernia repair with mesh.

## 2024-04-07 NOTE — ED Notes (Signed)
 Patient reports she is unable to provide a urine sample at this time.  Will attempt shortly

## 2024-04-07 NOTE — Discharge Instructions (Signed)
 As discussed, your workup today was overall reassuring.  Labs and CT scan appeared normal.  Suspect muscular etiology of your abdominal pain.  Will send medicine for cough.  You may take NSAIDs on top of this for the abdominal pain.  Please not hesitate to return if the worrisome signs and symptoms we discussed become apparent.

## 2024-04-07 NOTE — ED Provider Notes (Signed)
 [place] EMERGENCY DEPARTMENT AT [facility] Provider Note   CSN: [id number] Arrival date & time: 04/07/24  1042     History  Chief Complaint  Patient presents with   Abdominal Pain    [name] is a 43 y.o. female.   Abdominal Pain   43 year old female presents emergency department with complaints of left lower abdominal pain.  States that she had cough since middle of March that has not been improving with over-the-counter medications.  Was diagnosed with viral illness but with continued cough.  Denies any fever, shortness of breath, chest pain.  States that as the cough is continued, has noticed bulging in her left lower abdomen worse with coughing.  States it is painful when she notices the bulging.  Has history of hernia repair on that side and is concerned about possible recurrence.  Hernia repair was in 2016.  Denies any fever, chills, nausea, vomiting, urinary symptoms, change in bowel habits.  Past medical history significant for anxiety, dysrhythmia, GERD, hypertension, ovarian cyst, paroxysmal atrial fibrillation on Xarelto , GERD, hernia  Home Medications Prior to Admission medications   Medication Sig Start Date End Date Taking? Authorizing Provider  HYDROcodone  bit-homatropine (HYCODAN) 5-1.5 MG/5ML syrup Take 5 mLs by mouth every 6 (six) hours as needed for cough. 04/07/24  Yes Neil Balls A, PA  ibuprofen  (ADVIL ) 800 MG tablet Take 1 tablet (800 mg total) by mouth every 8 (eight) hours as needed. 04/07/24  Yes Neil Balls A, PA  albuterol  (PROVENTIL ) (2.5 MG/3ML) 0.083% nebulizer solution Take 3 mLs (2.5 mg total) by nebulization every 6 (six) hours as needed for wheezing or shortness of breath. 02/27/24   Starlene Eaton, FNP  albuterol  (VENTOLIN  HFA) 108 (90 Base) MCG/ACT inhaler Inhale 2 puffs into the lungs every 4 (four) hours as needed.    [provider]  amoxicillin -clavulanate (AUGMENTIN ) 875-125 MG tablet 1 tablet Orally  every 12 hrs for 7 days 03/05/24   [provider]  benzonatate  (TESSALON ) 100 MG capsule Take 1-2 capsules (100-200 mg total) by mouth 3 (three) times daily as needed. 02/26/24   Angelia Kelp, PA-C  cloNIDine (CATAPRES) 0.1 MG tablet Take 0.1 mg by mouth at bedtime. 04/11/23   [provider]  diltiazem  (CARDIZEM  CD) 180 MG 24 hr capsule Take 1 capsule (180 mg total) by mouth daily. 12/28/23   Jacqueline Matsu, MD  doxycycline  (VIBRA -TABS) 100 MG tablet Take 1 tablet (100 mg total) by mouth 2 (two) times daily. 02/26/24   Angelia Kelp, PA-C  famotidine  (PEPCID ) 20 MG tablet TAKE 1 TABLET BY MOUTH DAILY AT BEDTIME 01/23/24   Zehr, Jessica D, PA-C  Fluticasone -Salmeterol (ADVAIR HFA IN) Inhale 2 puffs into the lungs daily.    [provider]  gabapentin  (NEURONTIN ) 300 MG capsule Take 1 capsule (300 mg total) by mouth at bedtime. 11/30/23   Granville Layer, MD  ipratropium (ATROVENT ) 0.03 % nasal spray Place 2 sprays into both nostrils every 12 (twelve) hours. 02/26/24   Angelia Kelp, PA-C  mirtazapine  (REMERON ) 15 MG tablet Take 1 tablet (15 mg total) by mouth at bedtime. 08/02/23   Zehr, Jessica D, PA-C  mirtazapine  (REMERON ) 30 MG tablet Take 30 mg by mouth at bedtime. 09/12/23   [provider]  montelukast  (SINGULAIR ) 10 MG tablet Take 10 mg by mouth daily. 06/27/22   [provider]  nicotine  (NICODERM CQ  - DOSED IN MG/24 HOURS) 14 mg/24hr patch 14 mg daily.  [provider]  nicotine  polacrilex (COMMIT) 4 MG lozenge 1 lozenge as needed Mouth/Throat up to 10 time(s) a day as needed for cravings    [provider]  NICOTINE  STEP 3 7 MG/24HR patch Place 7 mg onto the skin daily.    [provider]  ondansetron  (ZOFRAN -ODT) 4 MG disintegrating tablet Take 1 tablet (4 mg total) by mouth every 8 (eight) hours as needed for nausea or vomiting. 02/27/24   Starlene Eaton, FNP  pantoprazole  (PROTONIX ) 40 MG tablet  Take 1 tablet (40 mg total) by mouth daily. 07/18/22 10/05/23  Kaul, Aseem, MD  predniSONE  (DELTASONE ) 20 MG tablet Take 2 tablets (40 mg total) by mouth daily with breakfast. 02/26/24   Angelia Kelp, PA-C  rivaroxaban  (XARELTO ) 20 MG TABS tablet Take 1 tablet (20 mg total) by mouth daily with supper. 12/28/23   Jacqueline Matsu, MD  zolpidem (AMBIEN) 10 MG tablet Take 10 mg by mouth at bedtime as needed. 05/15/23   [provider]      Allergies    Flexeril  [cyclobenzaprine ], Lamotrigine, and Tramadol     Review of Systems   Review of Systems  Gastrointestinal:  Positive for abdominal pain.  All other systems reviewed and are negative.   Physical Exam Updated Vital Signs BP (!) 141/105 (BP Location: Left Arm)   Pulse 99   Temp 98.1 F (36.7 C) (Oral)   Resp 16   Ht 5' (1.524 m)   Wt 65.8 kg   LMP 02/26/2015   SpO2 100%   BMI 28.32 kg/m  Physical Exam Vitals and nursing note reviewed.  Constitutional:      General: She is not in acute distress.    Appearance: She is well-developed.  HENT:     Head: Normocephalic and atraumatic.  Eyes:     Conjunctiva/sclera: Conjunctivae normal.  Cardiovascular:     Rate and Rhythm: Normal rate and regular rhythm.     Heart sounds: No murmur heard. Pulmonary:     Effort: Pulmonary effort is normal. No respiratory distress.     Breath sounds: Normal breath sounds.  Abdominal:     Palpations: Abdomen is soft.     Tenderness: There is abdominal tenderness in the left lower quadrant. There is no right CVA tenderness, left CVA tenderness or guarding.     Comments: Patient with tenderness left lower quadrant of abdomen with Valsalva.  No obvious appreciable mass.  No overlying skin changes such as erythema, induration, palpable fluctuance.  Musculoskeletal:        General: No swelling.     Cervical back: Neck supple.  Skin:    General: Skin is warm and dry.     Capillary Refill: Capillary refill takes less than 2 seconds.   Neurological:     Mental Status: She is alert.  Psychiatric:        Mood and Affect: Mood normal.     ED Results / Procedures / Treatments   Labs (all labs ordered are listed, but only abnormal results are displayed) Labs Reviewed  CBC  URINALYSIS, ROUTINE W REFLEX MICROSCOPIC  COMPREHENSIVE METABOLIC PANEL WITH GFR  HCG, QUANTITATIVE, PREGNANCY  LIPASE, BLOOD    EKG None  Radiology CT ABDOMEN PELVIS W CONTRAST Result Date: 04/07/2024 CLINICAL DATA:  Left lower quadrant abdominal pain. EXAM: CT ABDOMEN AND PELVIS WITH CONTRAST TECHNIQUE: Multidetector CT imaging of the abdomen and pelvis was performed using the standard protocol following bolus administration of intravenous contrast. RADIATION DOSE REDUCTION: This  exam was performed according to the departmental dose-optimization program which includes automated exposure control, adjustment of the mA and/or kV according to patient size and/or use of iterative reconstruction technique. CONTRAST:  OMNIPAQUE  IOHEXOL  300 MG/ML  SOLN COMPARISON:  CT abdomen pelvis dated 07/17/2022. FINDINGS: Lower chest: The visualized lung bases are clear. No intra-abdominal free air.  No significant free fluid. Hepatobiliary: No focal liver abnormality is seen. No gallstones, gallbladder wall thickening, or biliary dilatation. Pancreas: Unremarkable. No pancreatic ductal dilatation or surrounding inflammatory changes. Spleen: Normal in size without focal abnormality. Adrenals/Urinary Tract: The adrenal glands unremarkable. There is no hydronephrosis on either side. The visualized ureters and urinary bladder appear unremarkable. Stomach/Bowel: There is no bowel obstruction or active inflammation. The appendix is normal. Vascular/Lymphatic: The abdominal aorta and IVC unremarkable. No portal venous gas. There is no adenopathy. Reproductive: The uterus is anteverted. No suspicious adnexal masses Other: Ventral hernia repair. Musculoskeletal: No acute or  significant osseous findings. IMPRESSION: No acute intra-abdominal or pelvic pathology. Electronically Signed   By: Angus Bark M.D.   On: 04/07/2024 15:04    Procedures Procedures    Medications Ordered in ED Medications  HYDROcodone -acetaminophen  (NORCO/VICODIN) 5-325 MG per tablet 1 tablet (1 tablet Oral Given 04/07/24 1356)  iohexol  (OMNIPAQUE ) 300 MG/ML solution 100 mL (100 mLs Intravenous Contrast Given 04/07/24 1417)    ED Course/ Medical Decision Making/ A&P                                 Medical Decision Making Amount and/or Complexity of Data Reviewed Labs: ordered. Radiology: ordered.  Risk Prescription drug management.   This patient presents to the ED for concern of pain, this involves an extensive number of treatment options, and is a complaint that carries with it a high risk of complications and morbidity.  The differential diagnosis includes gastritis, gastroparesis, CBD pathology, pancreatitis, SBO/LBO, volvulus, diverticulitis, appendicitis, IUP, hyperemesis cannabinoid syndrome, ovarian torsion, tubo-ovarian abscess, ectopic pregnancy, hernia, other other   Co morbidities that complicate the patient evaluation  See HPI   Additional history obtained:  Additional history obtained from EMR External records from outside source obtained and reviewed including hospital records   Lab Tests:  I Ordered, and personally interpreted labs.  The pertinent results include: No leukocytosis.  No evidence of anemia.  Platelets within range.  Nontraumatic.  No transaminitis.  No renal dysfunction.  hCG negative.  Lipase within normal limits.  UA without abnormality.   Imaging Studies ordered:  I ordered imaging studies including CT abdomen pelvis I independently visualized and interpreted imaging which showed no acute abnormality I agree with the radiologist interpretation   Cardiac Monitoring: / EKG:  The patient was maintained on a cardiac monitor.  I  personally viewed and interpreted the cardiac monitored which showed an underlying rhythm of: Rhythm   Consultations Obtained:  N/a   Problem List / ED Course / Critical interventions / Medication management  Left lower abdominal pain I ordered medication including Norco   Reevaluation of the patient after these medicines showed that the patient improved I have reviewed the patients home medicines and have made adjustments as needed   Social Determinants of Health:  Full cigarette use.  Denies illicit drug use.   Test / Admission - Considered:  Left lower abdominal pain Vitals signs significant for hypertension blood pressure 141/105. Otherwise within normal range and stable throughout visit. Laboratory/imaging studies significant for: See  above 43 year old female presents emergency department with complaints of left lower abdominal pain.  States that she had cough since middle of March that has not been improving with over-the-counter medications.  Was diagnosed with viral illness but with continued cough.  Denies any fever, shortness of breath, chest pain.  States that as the cough is continued, has noticed bulging in her left lower abdomen worse with coughing.  States it is painful when she notices the bulging.  Has history of hernia repair on that side and is concerned about possible recurrence.  Hernia repair was in 2016.  Denies any fever, chills, nausea, vomiting, urinary symptoms, change in bowel habits. On exam, tenderness left lower quadrant of abdomen mostly with patient Valsalva.  No obvious appreciable mass or overlying skin changes concerning for second infectious process.  Labs unremarkable for any acute process.  CT imaging appeared normal.  Suspect the patient has muscular injury of abdominal wall given excessive coughing in the outpatient setting.  Will trial additional cough medicine to try to treat patient's symptoms and recommend continue some Tylenol /Motrin  for pain.   Will recommend follow-up with PCP for reevaluation of symptoms.  Treatment plan discussed with patient and she acknowledged understanding was agreeable to said plan.  Patient overall well-appearing, afebrile in no acute distress. Worrisome signs and symptoms were discussed with the patient, and the patient acknowledged understanding to return to the ED if noticed. Patient was stable upon discharge.          Final Clinical Impression(s) / ED Diagnoses Final diagnoses:  Left lower quadrant abdominal pain  Acute cough    Rx / DC Orders ED Discharge Orders     None         [place], [place] 04/07/24 1556    Nicklas Barns, MD 04/09/24 [phone]

## 2024-04-08 DIAGNOSIS — J984 Other disorders of lung: Secondary | ICD-10-CM | POA: Diagnosis not present

## 2024-04-09 DIAGNOSIS — I10 Essential (primary) hypertension: Secondary | ICD-10-CM | POA: Diagnosis not present

## 2024-04-09 DIAGNOSIS — J984 Other disorders of lung: Secondary | ICD-10-CM | POA: Diagnosis not present

## 2024-04-09 DIAGNOSIS — J45909 Unspecified asthma, uncomplicated: Secondary | ICD-10-CM | POA: Diagnosis not present

## 2024-04-09 DIAGNOSIS — J069 Acute upper respiratory infection, unspecified: Secondary | ICD-10-CM | POA: Diagnosis not present

## 2024-04-09 DIAGNOSIS — R051 Acute cough: Secondary | ICD-10-CM | POA: Diagnosis not present

## 2024-04-09 DIAGNOSIS — J439 Emphysema, unspecified: Secondary | ICD-10-CM | POA: Diagnosis not present

## 2024-04-15 DIAGNOSIS — Z419 Encounter for procedure for purposes other than remedying health state, unspecified: Secondary | ICD-10-CM | POA: Diagnosis not present

## 2024-04-17 DIAGNOSIS — F419 Anxiety disorder, unspecified: Secondary | ICD-10-CM | POA: Diagnosis not present

## 2024-04-22 ENCOUNTER — Telehealth: Payer: Self-pay | Admitting: Family Medicine

## 2024-04-23 NOTE — Telephone Encounter (Signed)
 Please excuse this error

## 2024-05-01 DIAGNOSIS — F331 Major depressive disorder, recurrent, moderate: Secondary | ICD-10-CM | POA: Diagnosis not present

## 2024-05-01 DIAGNOSIS — F411 Generalized anxiety disorder: Secondary | ICD-10-CM | POA: Diagnosis not present

## 2024-05-01 DIAGNOSIS — F5105 Insomnia due to other mental disorder: Secondary | ICD-10-CM | POA: Diagnosis not present

## 2024-05-13 ENCOUNTER — Ambulatory Visit: Payer: Medicaid Other | Admitting: Dermatology

## 2024-05-16 DIAGNOSIS — Z419 Encounter for procedure for purposes other than remedying health state, unspecified: Secondary | ICD-10-CM | POA: Diagnosis not present

## 2024-05-21 DIAGNOSIS — Z789 Other specified health status: Secondary | ICD-10-CM | POA: Diagnosis not present

## 2024-05-21 DIAGNOSIS — R053 Chronic cough: Secondary | ICD-10-CM | POA: Diagnosis not present

## 2024-05-21 DIAGNOSIS — J438 Other emphysema: Secondary | ICD-10-CM | POA: Diagnosis not present

## 2024-05-21 DIAGNOSIS — G4733 Obstructive sleep apnea (adult) (pediatric): Secondary | ICD-10-CM | POA: Diagnosis not present

## 2024-05-21 DIAGNOSIS — J849 Interstitial pulmonary disease, unspecified: Secondary | ICD-10-CM | POA: Diagnosis not present

## 2024-05-24 DIAGNOSIS — F331 Major depressive disorder, recurrent, moderate: Secondary | ICD-10-CM | POA: Diagnosis not present

## 2024-05-24 DIAGNOSIS — F411 Generalized anxiety disorder: Secondary | ICD-10-CM | POA: Diagnosis not present

## 2024-06-12 DIAGNOSIS — J849 Interstitial pulmonary disease, unspecified: Secondary | ICD-10-CM | POA: Diagnosis not present

## 2024-06-15 DIAGNOSIS — Z419 Encounter for procedure for purposes other than remedying health state, unspecified: Secondary | ICD-10-CM | POA: Diagnosis not present

## 2024-06-21 DIAGNOSIS — F331 Major depressive disorder, recurrent, moderate: Secondary | ICD-10-CM | POA: Diagnosis not present

## 2024-06-21 DIAGNOSIS — F411 Generalized anxiety disorder: Secondary | ICD-10-CM | POA: Diagnosis not present

## 2024-06-24 DIAGNOSIS — R053 Chronic cough: Secondary | ICD-10-CM | POA: Diagnosis not present

## 2024-06-27 ENCOUNTER — Ambulatory Visit: Attending: Cardiology | Admitting: Cardiology

## 2024-06-27 VITALS — BP 124/72 | HR 67 | Ht 60.0 in | Wt 144.2 lb

## 2024-06-27 DIAGNOSIS — Z01812 Encounter for preprocedural laboratory examination: Secondary | ICD-10-CM | POA: Diagnosis not present

## 2024-06-27 DIAGNOSIS — I10 Essential (primary) hypertension: Secondary | ICD-10-CM | POA: Diagnosis not present

## 2024-06-27 DIAGNOSIS — I480 Paroxysmal atrial fibrillation: Secondary | ICD-10-CM

## 2024-06-27 DIAGNOSIS — I351 Nonrheumatic aortic (valve) insufficiency: Secondary | ICD-10-CM

## 2024-06-27 DIAGNOSIS — R072 Precordial pain: Secondary | ICD-10-CM | POA: Diagnosis not present

## 2024-06-27 MED ORDER — DILTIAZEM HCL ER COATED BEADS 180 MG PO CP24: 180.0000 mg | ORAL_CAPSULE | Freq: Every day | ORAL | 0 refills | Status: DC

## 2024-06-27 MED ORDER — METOPROLOL TARTRATE 50 MG PO TABS: 50.0000 mg | ORAL_TABLET | Freq: Once | ORAL | 0 refills | Status: DC

## 2024-06-27 NOTE — Patient Instructions (Addendum)
 Medication Instructions:  Your physician recommends that you continue on your current medications as directed. Please refer to the Current Medication list given to you today.  *If you need a refill on your cardiac medications before your next appointment, please call your pharmacy*  Lab Work: Please complete a BMET in our first floor lab today before you leave. You do not need to be fasting for this test.  If you have labs (blood work) drawn today and your tests are completely normal, you will receive your results only by: MyChart Message (if you have MyChart) OR A paper copy in the mail If you have any lab test that is abnormal or we need to change your treatment, we will call you to review the results.  Testing/Procedures: Your physician has requested that you have an echocardiogram. Echocardiography is a painless test that uses sound waves to create images of your heart. It provides your doctor with information about the size and shape of your heart and how well your heart's chambers and valves are working. This procedure takes approximately one hour. There are no restrictions for this procedure. Please do NOT wear cologne, perfume, aftershave, or lotions (deodorant is allowed). Please arrive 15 minutes prior to your appointment time.  Please note: We ask at that you not bring children with you during ultrasound (echo/ vascular) testing. Due to room size and safety concerns, children are not allowed in the ultrasound rooms during exams. Our front office staff cannot provide observation of children in our lobby area while testing is being conducted. An adult accompanying a patient to their appointment will only be allowed in the ultrasound room at the discretion of the ultrasound technician under special circumstances. We apologize for any inconvenience.  Coronary CT Angiogram - see instructions below  Follow-Up: At [facility], you and your health needs are our priority.  As part  of our continuing mission to provide you with exceptional heart care, our providers are all part of one team.  This team includes your primary Cardiologist (physician) and Advanced Practice Providers or APPs (Physician Assistants and Nurse Practitioners) who all work together to provide you with the care you need, when you need it.  Your next appointment:   1 year(s)  Provider:   Dr. Wilbert Bihari, MD     Your cardiac CT will be scheduled at one of the below locations:   [facility] [address] [place], [place] [address] [phone]  OR  [facility] [address]  [place], [place] [address]   If scheduled at [facility], please arrive at the [facility] and Children's Entrance (Entrance C2) of [facility] 30 minutes prior to test start time. You can use the FREE valet parking offered at entrance C (encouraged to control the heart rate for the test)  Proceed to the [facility] Radiology Department (first floor) to check-in and test prep.  All radiology patients and guests should use entrance C2 at [facility], accessed from [facility], even though the hospital's physical address listed is [address].  If scheduled at the Heart and Vascular Tower at [address], please enter the parking lot using the [address] entrance and use the FREE valet service at the patient drop-off area. Enter the buidling and check-in with registration on the main floor.  Please follow these instructions carefully (unless otherwise directed):  An IV will be required for this test and Nitroglycerin will  be given.   On the Night Before the Test: Be sure to Drink plenty of water . Do not consume any caffeinated/decaffeinated beverages or chocolate 12 hours prior to your test. Do not take any antihistamines 12 hours prior to your test.  On the Day of the Test: Drink plenty of water  until 1 hour prior to the  test. Do not eat any food 1 hour prior to test. You may take your regular medications prior to the test.  Take metoprolol  (Lopressor ) two hours prior to test. Patients who wear a continuous glucose monitor MUST remove the device prior to scanning. FEMALES- please wear underwire-free bra if available, avoid dresses & tight clothing      After the Test: Drink plenty of water . After receiving IV contrast, you may experience a mild flushed feeling. This is normal. On occasion, you may experience a mild rash up to 24 hours after the test. This is not dangerous. If this occurs, you can take Benadryl  25 mg, Zyrtec, Claritin , or Allegra and increase your fluid intake. (Patients taking Tikosyn should avoid Benadryl , and may take Zyrtec, Claritin , or Allegra) If you experience trouble breathing, this can be serious. If it is severe call 911 IMMEDIATELY. If it is mild, please call our office.  We will call to schedule your test 2-4 weeks out understanding that some insurance companies will need an authorization prior to the service being performed.   For more information and frequently asked questions, please visit our website : [email or web address]  For non-scheduling related questions, please contact the cardiac imaging nurse navigator should you have any questions/concerns: Cardiac Imaging Nurse Navigators Direct Office Dial: [phone]   For scheduling needs, including cancellations and rescheduling, please call [place], [phone].

## 2024-06-27 NOTE — Addendum Note (Signed)
 Addended by: JANIT GENI CROME on: 06/27/2024 10:16 AM   Modules accepted: Orders

## 2024-06-27 NOTE — Addendum Note (Signed)
 Addended by: Mckinze Poirier L on: 06/27/2024 10:18 AM   Modules accepted: Orders

## 2024-06-27 NOTE — Progress Notes (Signed)
 Cardiology Office Note:    Date:  06/27/2024   ID:  EILIDH MARCANO, DOB 1981/05/31, MRN [id number]  PCP:  Valentin Skates, DO   [facility] Providers Cardiologist:  Powell FORBES Sorrow, MD (Inactive)   Referring MD: Valentin Skates, DO     History of Present Illness:    [name] is a 43 y.o. female with a hx of paroxysmal Afib who presents to clinic for follow-up.  Patient was seen by Cardiology in 2014 when she was initially diagnosed with Afib with RVR after presenting with nausea/vomiting from drinking the night before. During that admission, she was placed on IV dilt and ultimately converted to NSR. TTE at that time with LVEF 55-60%, mild AR. She was discharged home on PO dilt and ASA but she did not continue to take them. She was not seen by Cardiology in follow-up.  Presented to [facility] on 07/17/22 with nausea/vomiting and abdominal pain. On admission, she was in Afib with RVR with HR 128. Labs unremarkable. CT abdomen/pelvis with small hiatal hernia but no acute pathology. She was thought to have gastritis. She was placed on dilt gtt with improvement. She was started on apixaban  and dilt with good response.  TTE 07/2022 with LVEF 70-75%, low normal RV systolic function, mild AR.   She is here for follow-up today.  She tells me that recently she has been having some sharp pains in her chest that occur mainly when she is worrying about how to pay bills and thinks it is anxiety.  She was placed on Zoloft  for anxiety but still gets the pains. She will feel nauseated when she gets the pain.  She does have sweats but thinks it is related to menopause.  She will also feel like she cannot take a deep breath in at that time.  She has asthma so has DOE that is chronic and is seeing pulmonary and being worked up for emphysema.  She denies any PND, orthopnea, lower extreme edema or syncope. She has had some problems with vertigo.   Past Medical History:  Diagnosis Date    Anxiety    Asthma    Back pain, chronic    Depression    hx of    Dysrhythmia 12/06/2011   hx of a-fib   Ectopic pregnancy    GERD (gastroesophageal reflux disease)    Headache    occasional    Hypertension    was told, but never on meds   Infection    UTI   Ovarian cyst    Urinary tract infection     Past Surgical History:  Procedure Laterality Date   CLEFT PALATE REPAIR     etopical surgery     LAPAROSCOPY FOR ECTOPIC PREGNANCY  2011   TYMPANOPLASTY Right 08/03/2015   Procedure: RIGHT MEDIAL TECHNIQUE TYMPANOPLASTY;  Surgeon: Marlyce Finer, MD;  Location: [facility];  Service: ENT;  Laterality: Right;   VENTRAL HERNIA REPAIR N/A 03/26/2015   Procedure: LAPAROSCOPIC REPAIR OF INCARCERATED SUPERUMBILICAL HERNIA X2 AND UMBILICAL HERNIA REPAIR WITH MESH;  Surgeon: Elspeth Schultze, MD;  Location: [facility];  Service: General;  Laterality: N/A;    Current Medications: Current Meds  Medication Sig   albuterol  (PROVENTIL ) (2.5 MG/3ML) 0.083% nebulizer solution Take 3 mLs (2.5 mg total) by nebulization every 6 (six) hours as needed for wheezing or shortness of breath.   albuterol  (VENTOLIN  HFA) 108 (90 Base) MCG/ACT inhaler Inhale 2 puffs into the lungs every 4 (four) hours as  needed.   BREZTRI AEROSPHERE 160-9-4.8 MCG/ACT AERO inhaler Inhale 2 puffs into the lungs 2 (two) times daily.   cloNIDine (CATAPRES) 0.1 MG tablet Take 0.1 mg by mouth at bedtime.   famotidine  (PEPCID ) 20 MG tablet TAKE 1 TABLET BY MOUTH DAILY AT BEDTIME   gabapentin  (NEURONTIN ) 300 MG capsule Take 1 capsule (300 mg total) by mouth at bedtime.   ibuprofen  (ADVIL ) 800 MG tablet Take 1 tablet (800 mg total) by mouth every 8 (eight) hours as needed.   ipratropium (ATROVENT ) 0.03 % nasal spray Place 2 sprays into both nostrils every 12 (twelve) hours.   montelukast  (SINGULAIR ) 10 MG tablet Take 10 mg by mouth daily.   pantoprazole  (PROTONIX ) 40 MG tablet Take 1 tablet (40 mg total) by mouth daily.    rivaroxaban  (XARELTO ) 20 MG TABS tablet Take 1 tablet (20 mg total) by mouth daily with supper.   sertraline  (ZOLOFT ) 50 MG tablet Take 50 mg by mouth daily.   traZODone  (DESYREL ) 100 MG tablet Take 100 mg by mouth at bedtime.   zolpidem (AMBIEN) 10 MG tablet Take 10 mg by mouth at bedtime as needed.   [DISCONTINUED] diltiazem  (CARDIZEM  CD) 180 MG 24 hr capsule Take 1 capsule (180 mg total) by mouth daily.   [DISCONTINUED] diltiazem  (CARDIZEM  LA) 180 MG 24 hr tablet Take 180 mg by mouth daily.     Allergies:   Doxycycline , Flexeril  [cyclobenzaprine ], Lamotrigine, and Tramadol    Social History   Socioeconomic History   Marital status: Single    Spouse name: Not on file   Number of children: Not on file   Years of education: Not on file   Highest education level: Associate degree: academic program  Occupational History   Not on file  Tobacco Use   Smoking status: Former    Current packs/day: 0.25    Average packs/day: 0.3 packs/day for 15.0 years (3.8 ttl pk-yrs)    Types: Cigarettes   Smokeless tobacco: Never  Vaping Use   Vaping status: Never Used  Substance and Sexual Activity   Alcohol use: Not Currently    Alcohol/week: 1.0 standard drink of alcohol    Types: 1 Standard drinks or equivalent per week    Comment: social   Drug use: No   Sexual activity: Yes    Partners: Male    Birth control/protection: None  Other Topics Concern   Not on file  Social History Narrative   Right handed   Caffeine-none   Lives with fiance   Social Drivers of Health   Financial Resource Strain: High Risk (03/21/2024)   Overall Financial Resource Strain (CARDIA)    Difficulty of Paying Living Expenses: Very hard  Food Insecurity: Low Risk  (05/21/2024)   Received from [facility]   Hunger Vital Sign    Within the past 12 months, you worried that your food would run out before you got money to buy more: Never true    Within the past 12 months, the food you bought just didn't last and  you didn't have money to get more. : Never true  Recent Concern: Food Insecurity - Food Insecurity Present (03/21/2024)   Hunger Vital Sign    Worried About Running Out of Food in the Last Year: Sometimes true    Ran Out of Food in the Last Year: Often true  Transportation Needs: No Transportation Needs (05/21/2024)   Received from [facility]    In the past 12 months, has lack of reliable  transportation kept you from medical appointments, meetings, work or from getting things needed for daily living? : No  Recent Concern: Transportation Needs - Unmet Transportation Needs (03/21/2024)   PRAPARE - Transportation    Lack of Transportation (Medical): No    Lack of Transportation (Non-Medical): Yes  Physical Activity: Insufficiently Active (03/21/2024)   Exercise Vital Sign    Days of Exercise per Week: 3 days    Minutes of Exercise per Session: 10 min  Stress: Stress Concern Present (03/21/2024)   [employer] of Occupational Health - Occupational Stress Questionnaire    Feeling of Stress : Very much  Social Connections: Socially Isolated (03/21/2024)   Social Connection and Isolation Panel    Frequency of Communication with Friends and Family: Once a week    Frequency of Social Gatherings with Friends and Family: Never    Attends Religious Services: Never    Database administrator or Organizations: No    Attends Engineer, structural: Not on file    Marital Status: Never married     Family History: The patient's family history includes Cancer in her maternal grandfather and sister; Colon cancer in her maternal grandfather; Heart disease in her maternal grandmother; Hypertension in her father and mother. There is no history of Other, Hearing loss, Rectal cancer, Stomach cancer, or Esophageal cancer.  ROS:   Please see the history of present illness.     All other systems reviewed and are negative.  EKGs/Labs/Other Studies Reviewed:    The following  studies were reviewed today: TTE 07-26-22: IMPRESSIONS   1. Left ventricular ejection fraction, by estimation, is 70 to 75%. Left  ventricular ejection fraction by PLAX is 72 %. The left ventricle has  hyperdynamic function. The left ventricle has no regional wall motion  abnormalities. Left ventricular  diastolic parameters were normal.   2. Right ventricular systolic function is low normal. The right  ventricular size is normal. Tricuspid regurgitation signal is inadequate  for assessing PA pressure.   3. The mitral valve is abnormal. Trivial mitral valve regurgitation.   4. The aortic valve is tricuspid. Aortic valve regurgitation is mild.  Mild aortic valve stenosis.   5. The inferior vena cava is normal in size with greater than 50%  respiratory variability, suggesting right atrial pressure of 3 mmHg. TTE 01/2013: Study Conclusions   - Left ventricle: The cavity size was normal. Systolic    function was normal. The estimated ejection fraction was    in the range of 55% to 60%. Wall motion was normal; there    were no regional wall motion abnormalities. Left    ventricular diastolic function parameters were normal.  - Aortic valve: Mild regurgitation.    Recent Labs: 04/07/2024: ALT 14; BUN 11; Creatinine, Ser 0.58; Hemoglobin 14.1; Platelets 367; Potassium 4.8; Sodium 141  Recent Lipid Panel    Component Value Date/Time   CHOL 143 03/03/2023 0837   TRIG 108 03/03/2023 0837   HDL 42 03/03/2023 0837   CHOLHDL 3.4 03/03/2023 0837   CHOLHDL 1.8 09/01/2014 1134   VLDL 20 09/01/2014 1134   LDLCALC 81 03/03/2023 0837        Physical Exam:    VS:  BP 124/72   Pulse 67   Ht 5' (1.524 m)   Wt 144 lb 3.2 oz (65.4 kg)   LMP 02/26/2015   SpO2 99%   BMI 28.16 kg/m     Wt Readings from Last 3 Encounters:  06/27/24 144 lb 3.2  oz (65.4 kg)  04/07/24 145 lb (65.8 kg)  03/08/24 143 lb (64.9 kg)    GEN: Well nourished, well developed in no acute distress HEENT: Normal NECK:  No JVD; No carotid bruits LYMPHATICS: No lymphadenopathy CARDIAC:RRR, no  rubs, gallops 1/6 SM at RUSB to LUSB RESPIRATORY:  Clear to auscultation without rales, wheezing or rhonchi  ABDOMEN: Soft, non-tender, non-distended MUSCULOSKELETAL:  No edema; No deformity  SKIN: Warm and dry NEUROLOGIC:  Alert and oriented x 3 PSYCHIATRIC:  Normal affect  ASSESSMENT:    1. PAF (paroxysmal atrial fibrillation) (HCC)   2. Primary hypertension   3. Mild aortic regurgitation     PLAN:    In order of problems listed above:  #Paroxysmal Afib with RVR: -CHADs-vasc 2 for HTN and female gender.  -Had initial episode in 2014 and recurrent episode in 07/2022 in the setting of gastritis from alcohol use.  -TTE with LVEF 70-75%, normal RV, trivial MR, mild AR, mild AS.  -She is currently on dilt for A-fib suppression and xarelto  for [facility].  -She is maintaining sinus rhythm on exam today denies any palpitations -Declined ILR to monitor for A-fib burden to determine if she could come off anticoagulation.  Patient wants to stay on anticoagulation  -Continue diltiazem  180 mg daily and Xarelto  20 mg daily with as needed refills -I have personally reviewed and interpreted outside labs performed by patient's PCP which showed serum creatinine 0.58 and potassium 4.8, hemoglobin 14.1 on 04/07/2024  #Mild AR: #Borderline Mild AS: -TTE 07/18/22 with mild AR and borderline mild AS.  -Repeat 2D echo   #HTN: - BP controlled on exam today  - Continue diltiazem  180 mg daily with PRN refills  #Chest Pain -she has been having atypical CP associated with nausea and SOB but thinks it is related to anxiety -she has a fm hx of premature CAD (brother had stent at age 44) -I will get a coronary CTA to define coronary anatomy     Medication Adjustments/Labs and Tests Ordered: Current medicines are reviewed at length with the patient today.  Concerns regarding medicines are outlined above.  Orders Placed This Encounter   Procedures   EKG 12-Lead   Meds ordered this encounter  Medications   diltiazem  (CARDIZEM  CD) 180 MG 24 hr capsule    Sig: Take 1 capsule (180 mg total) by mouth daily.    Dispense:  90 capsule    Refill:  0    Pt must keep upcoming appt in April 2025 with new Cardiologist Dr. Shlomo before anymore refills. Thank you Final Attempt    There are no Patient Instructions on file for this visit.   Signed, Wilbert Shlomo, MD  06/27/2024 9:58 AM    [facility]

## 2024-06-27 NOTE — Addendum Note (Signed)
 Addended by: Cloys Vera on: 06/27/2024 10:33 AM   Modules accepted: Orders

## 2024-06-28 ENCOUNTER — Ambulatory Visit: Payer: Self-pay | Admitting: Cardiology

## 2024-06-28 ENCOUNTER — Encounter (HOSPITAL_COMMUNITY): Payer: Self-pay

## 2024-06-28 DIAGNOSIS — I351 Nonrheumatic aortic (valve) insufficiency: Secondary | ICD-10-CM

## 2024-06-28 LAB — BASIC METABOLIC PANEL WITH GFR
BUN/Creatinine Ratio: 10 (ref 9–23)
BUN: 7 mg/dL (ref 6–24)
CO2: 22 mmol/L (ref 20–29)
Calcium: 11.3 mg/dL — ABNORMAL HIGH (ref 8.7–10.2)
Chloride: 101 mmol/L (ref 96–106)
Creatinine, Ser: 0.73 mg/dL (ref 0.57–1.00)
Glucose: 80 mg/dL (ref 70–99)
Potassium: 4.7 mmol/L (ref 3.5–5.2)
Sodium: 140 mmol/L (ref 134–144)
eGFR: 104 mL/min/1.73 (ref >59)

## 2024-07-01 DIAGNOSIS — Z79899 Other long term (current) drug therapy: Secondary | ICD-10-CM | POA: Diagnosis not present

## 2024-07-01 DIAGNOSIS — E781 Pure hyperglyceridemia: Secondary | ICD-10-CM | POA: Diagnosis not present

## 2024-07-01 DIAGNOSIS — I10 Essential (primary) hypertension: Secondary | ICD-10-CM | POA: Diagnosis not present

## 2024-07-03 ENCOUNTER — Ambulatory Visit (HOSPITAL_COMMUNITY)

## 2024-07-03 ENCOUNTER — Encounter (HOSPITAL_COMMUNITY): Payer: Self-pay

## 2024-07-03 DIAGNOSIS — J438 Other emphysema: Secondary | ICD-10-CM | POA: Diagnosis not present

## 2024-07-03 DIAGNOSIS — R918 Other nonspecific abnormal finding of lung field: Secondary | ICD-10-CM | POA: Diagnosis not present

## 2024-07-16 DIAGNOSIS — Z419 Encounter for procedure for purposes other than remedying health state, unspecified: Secondary | ICD-10-CM | POA: Diagnosis not present

## 2024-07-22 ENCOUNTER — Other Ambulatory Visit (HOSPITAL_BASED_OUTPATIENT_CLINIC_OR_DEPARTMENT_OTHER)

## 2024-07-24 ENCOUNTER — Other Ambulatory Visit: Payer: Self-pay | Admitting: Gastroenterology

## 2024-07-26 DIAGNOSIS — L299 Pruritus, unspecified: Secondary | ICD-10-CM | POA: Diagnosis not present

## 2024-07-26 DIAGNOSIS — R12 Heartburn: Secondary | ICD-10-CM | POA: Diagnosis not present

## 2024-07-26 DIAGNOSIS — J439 Emphysema, unspecified: Secondary | ICD-10-CM | POA: Diagnosis not present

## 2024-07-26 DIAGNOSIS — I4891 Unspecified atrial fibrillation: Secondary | ICD-10-CM | POA: Diagnosis not present

## 2024-07-26 DIAGNOSIS — E28319 Asymptomatic premature menopause: Secondary | ICD-10-CM | POA: Diagnosis not present

## 2024-07-26 DIAGNOSIS — Z1389 Encounter for screening for other disorder: Secondary | ICD-10-CM | POA: Diagnosis not present

## 2024-07-26 DIAGNOSIS — I10 Essential (primary) hypertension: Secondary | ICD-10-CM | POA: Diagnosis not present

## 2024-07-26 DIAGNOSIS — Z1331 Encounter for screening for depression: Secondary | ICD-10-CM | POA: Diagnosis not present

## 2024-07-26 DIAGNOSIS — Z0000 Encounter for general adult medical examination without abnormal findings: Secondary | ICD-10-CM | POA: Diagnosis not present

## 2024-07-26 DIAGNOSIS — F319 Bipolar disorder, unspecified: Secondary | ICD-10-CM | POA: Diagnosis not present

## 2024-07-26 DIAGNOSIS — R61 Generalized hyperhidrosis: Secondary | ICD-10-CM | POA: Diagnosis not present

## 2024-07-26 DIAGNOSIS — R634 Abnormal weight loss: Secondary | ICD-10-CM | POA: Diagnosis not present

## 2024-07-26 DIAGNOSIS — Z23 Encounter for immunization: Secondary | ICD-10-CM | POA: Diagnosis not present

## 2024-07-26 DIAGNOSIS — I739 Peripheral vascular disease, unspecified: Secondary | ICD-10-CM | POA: Diagnosis not present

## 2024-07-31 DIAGNOSIS — F411 Generalized anxiety disorder: Secondary | ICD-10-CM | POA: Diagnosis not present

## 2024-07-31 DIAGNOSIS — F331 Major depressive disorder, recurrent, moderate: Secondary | ICD-10-CM | POA: Diagnosis not present

## 2024-08-06 ENCOUNTER — Ambulatory Visit (HOSPITAL_COMMUNITY)
Admission: RE | Admit: 2024-08-06 | Discharge: 2024-08-06 | Disposition: A | Discharge status: Home / Self Care | Source: Ambulatory Visit | Attending: Cardiology | Admitting: Cardiology

## 2024-08-06 DIAGNOSIS — I351 Nonrheumatic aortic (valve) insufficiency: Secondary | ICD-10-CM | POA: Insufficient documentation

## 2024-08-06 LAB — ECHOCARDIOGRAM COMPLETE
Area-P 1/2: 3.6 cm2
P 1/2 time: 461 ms
S' Lateral: 2.8 cm

## 2024-08-07 ENCOUNTER — Encounter: Payer: Self-pay | Admitting: Cardiology

## 2024-08-07 DIAGNOSIS — I351 Nonrheumatic aortic (valve) insufficiency: Secondary | ICD-10-CM | POA: Insufficient documentation

## 2024-08-09 ENCOUNTER — Telehealth: Payer: Self-pay | Admitting: Family Medicine

## 2024-08-09 ENCOUNTER — Ambulatory Visit: Payer: Self-pay | Admitting: Family Medicine

## 2024-08-09 NOTE — Telephone Encounter (Signed)
 PT NO SHOWED NEW PATIENT APPT 08/09/24 PT CAN NOT RESCHEDULE APPT

## 2024-08-16 DIAGNOSIS — Z419 Encounter for procedure for purposes other than remedying health state, unspecified: Secondary | ICD-10-CM | POA: Diagnosis not present

## 2024-08-23 ENCOUNTER — Ambulatory Visit: Admitting: Gastroenterology

## 2024-08-24 ENCOUNTER — Other Ambulatory Visit: Payer: Self-pay | Admitting: Cardiology

## 2024-08-24 DIAGNOSIS — I480 Paroxysmal atrial fibrillation: Secondary | ICD-10-CM

## 2024-08-25 ENCOUNTER — Other Ambulatory Visit: Payer: Self-pay | Admitting: Cardiology

## 2024-08-26 NOTE — Telephone Encounter (Signed)
 Prescription refill request for Xarelto  received.  Indication:afib Last office visit:7/25 Weight:65.4  kg Age:43 Scr:0.73  7/25 CrCl:101.53  ml/min  Prescription refilled

## 2024-09-05 ENCOUNTER — Ambulatory Visit: Payer: Self-pay | Admitting: Family Medicine

## 2024-09-15 DIAGNOSIS — Z419 Encounter for procedure for purposes other than remedying health state, unspecified: Secondary | ICD-10-CM | POA: Diagnosis not present

## 2024-09-24 ENCOUNTER — Ambulatory Visit: Admitting: Obstetrics and Gynecology

## 2024-09-24 DIAGNOSIS — M25562 Pain in left knee: Secondary | ICD-10-CM | POA: Diagnosis not present

## 2024-10-01 DIAGNOSIS — J4541 Moderate persistent asthma with (acute) exacerbation: Secondary | ICD-10-CM | POA: Diagnosis not present

## 2024-10-01 DIAGNOSIS — J0110 Acute frontal sinusitis, unspecified: Secondary | ICD-10-CM | POA: Diagnosis not present

## 2024-10-01 DIAGNOSIS — R232 Flushing: Secondary | ICD-10-CM | POA: Diagnosis not present

## 2024-10-07 ENCOUNTER — Encounter: Payer: Self-pay | Admitting: Radiology

## 2024-10-07 DIAGNOSIS — F411 Generalized anxiety disorder: Secondary | ICD-10-CM | POA: Diagnosis not present

## 2024-10-10 ENCOUNTER — Encounter (HOSPITAL_COMMUNITY): Payer: Self-pay | Admitting: *Deleted

## 2024-10-10 ENCOUNTER — Encounter (HOSPITAL_COMMUNITY): Payer: Self-pay

## 2024-10-10 ENCOUNTER — Other Ambulatory Visit: Payer: Self-pay

## 2024-10-10 ENCOUNTER — Emergency Department (HOSPITAL_COMMUNITY)

## 2024-10-10 ENCOUNTER — Emergency Department (HOSPITAL_COMMUNITY)
Admission: EM | Admit: 2024-10-10 | Discharge: 2024-10-10 | Disposition: A | Discharge status: Home / Self Care | Source: Home / Self Care | Attending: Emergency Medicine | Admitting: Emergency Medicine

## 2024-10-10 ENCOUNTER — Emergency Department (HOSPITAL_COMMUNITY)
Admission: EM | Admit: 2024-10-10 | Discharge: 2024-10-10 | Discharge status: Against Medical Advice | Attending: Emergency Medicine | Admitting: Emergency Medicine

## 2024-10-10 DIAGNOSIS — R1031 Right lower quadrant pain: Secondary | ICD-10-CM | POA: Diagnosis present

## 2024-10-10 DIAGNOSIS — Z5321 Procedure and treatment not carried out due to patient leaving prior to being seen by health care provider: Secondary | ICD-10-CM | POA: Insufficient documentation

## 2024-10-10 DIAGNOSIS — E876 Hypokalemia: Secondary | ICD-10-CM | POA: Insufficient documentation

## 2024-10-10 DIAGNOSIS — R197 Diarrhea, unspecified: Secondary | ICD-10-CM | POA: Insufficient documentation

## 2024-10-10 DIAGNOSIS — Z79899 Other long term (current) drug therapy: Secondary | ICD-10-CM | POA: Insufficient documentation

## 2024-10-10 DIAGNOSIS — K529 Noninfective gastroenteritis and colitis, unspecified: Secondary | ICD-10-CM | POA: Insufficient documentation

## 2024-10-10 DIAGNOSIS — R112 Nausea with vomiting, unspecified: Secondary | ICD-10-CM | POA: Diagnosis present

## 2024-10-10 DIAGNOSIS — Z743 Need for continuous supervision: Secondary | ICD-10-CM | POA: Diagnosis not present

## 2024-10-10 DIAGNOSIS — R110 Nausea: Secondary | ICD-10-CM | POA: Insufficient documentation

## 2024-10-10 DIAGNOSIS — I10 Essential (primary) hypertension: Secondary | ICD-10-CM | POA: Insufficient documentation

## 2024-10-10 DIAGNOSIS — R6889 Other general symptoms and signs: Secondary | ICD-10-CM | POA: Diagnosis not present

## 2024-10-10 DIAGNOSIS — D270 Benign neoplasm of right ovary: Secondary | ICD-10-CM | POA: Diagnosis not present

## 2024-10-10 DIAGNOSIS — R718 Other abnormality of red blood cells: Secondary | ICD-10-CM | POA: Insufficient documentation

## 2024-10-10 DIAGNOSIS — R1084 Generalized abdominal pain: Secondary | ICD-10-CM | POA: Diagnosis not present

## 2024-10-10 LAB — URINALYSIS, ROUTINE W REFLEX MICROSCOPIC
Bacteria, UA: NONE SEEN
Bilirubin Urine: NEGATIVE
Glucose, UA: NEGATIVE mg/dL
Hgb urine dipstick: NEGATIVE
Ketones, ur: 5 mg/dL — AB
Nitrite: NEGATIVE
Protein, ur: NEGATIVE mg/dL
Specific Gravity, Urine: 1.004 — ABNORMAL LOW (ref 1.005–1.030)
pH: 7 (ref 5.0–8.0)

## 2024-10-10 LAB — COMPREHENSIVE METABOLIC PANEL WITH GFR
ALT: 16 U/L (ref 0–44)
AST: 16 U/L (ref 15–41)
Albumin: 4.3 g/dL (ref 3.5–5.0)
Alkaline Phosphatase: 63 U/L (ref 38–126)
Anion gap: 14 (ref 5–15)
BUN: <5 mg/dL — ABNORMAL LOW (ref 6–20)
CO2: 21 mmol/L — ABNORMAL LOW (ref 22–32)
Calcium: 9.9 mg/dL (ref 8.9–10.3)
Chloride: 100 mmol/L (ref 98–111)
Creatinine, Ser: 0.78 mg/dL (ref 0.44–1.00)
GFR, Estimated: >60 mL/min (ref >60)
Glucose, Bld: 95 mg/dL (ref 70–99)
Potassium: 3.2 mmol/L — ABNORMAL LOW (ref 3.5–5.1)
Sodium: 135 mmol/L (ref 135–145)
Total Bilirubin: 1 mg/dL (ref 0.0–1.2)
Total Protein: 7.5 g/dL (ref 6.5–8.1)

## 2024-10-10 LAB — CBC
HCT: 44.6 % (ref 36.0–46.0)
Hemoglobin: 15.6 g/dL — ABNORMAL HIGH (ref 12.0–15.0)
MCH: 30.1 pg (ref 26.0–34.0)
MCHC: 35 g/dL (ref 30.0–36.0)
MCV: 85.9 fL (ref 80.0–100.0)
Platelets: 369 K/uL (ref 150–400)
RBC: 5.19 MIL/uL — ABNORMAL HIGH (ref 3.87–5.11)
RDW: 14.4 % (ref 11.5–15.5)
WBC: 12.6 K/uL — ABNORMAL HIGH (ref 4.0–10.5)
nRBC: 0 % (ref 0.0–0.2)

## 2024-10-10 LAB — LIPASE, BLOOD: Lipase: 25 U/L (ref 11–51)

## 2024-10-10 MED ORDER — MORPHINE SULFATE (PF) 4 MG/ML IV SOLN
4.0000 mg | Freq: Once | INTRAVENOUS | Status: AC
  Administered 2024-10-10: 4 mg via INTRAVENOUS
  Filled 2024-10-10: qty 1

## 2024-10-10 MED ORDER — SODIUM CHLORIDE 0.9 % IV BOLUS
1000.0000 mL | Freq: Once | INTRAVENOUS | Status: AC
  Administered 2024-10-10: 1000 mL via INTRAVENOUS

## 2024-10-10 MED ORDER — IOHEXOL 350 MG/ML SOLN
75.0000 mL | Freq: Once | INTRAVENOUS | Status: AC | PRN
  Administered 2024-10-10: 75 mL via INTRAVENOUS

## 2024-10-10 MED ORDER — ONDANSETRON HCL 4 MG/2ML IJ SOLN
4.0000 mg | Freq: Once | INTRAMUSCULAR | Status: AC
  Administered 2024-10-10: 4 mg via INTRAVENOUS
  Filled 2024-10-10: qty 2

## 2024-10-10 MED ORDER — PROMETHAZINE HCL 25 MG PO TABS: 25.0000 mg | ORAL_TABLET | Freq: Four times a day (QID) | ORAL | 0 refills | Status: DC | PRN

## 2024-10-10 MED ORDER — DICYCLOMINE HCL 20 MG PO TABS
20.0000 mg | ORAL_TABLET | Freq: Two times a day (BID) | ORAL | 0 refills | Status: AC
Start: 2024-10-10 — End: ?

## 2024-10-10 MED ORDER — DICYCLOMINE HCL 10 MG PO CAPS
10.0000 mg | ORAL_CAPSULE | Freq: Once | ORAL | Status: AC
  Administered 2024-10-10: 10 mg via ORAL
  Filled 2024-10-10: qty 1

## 2024-10-10 MED ORDER — HYDROCODONE-ACETAMINOPHEN 5-325 MG PO TABS: 1.0000 | ORAL_TABLET | Freq: Four times a day (QID) | ORAL | 0 refills | Status: DC | PRN

## 2024-10-10 MED ORDER — POTASSIUM CHLORIDE CRYS ER 20 MEQ PO TBCR
40.0000 meq | EXTENDED_RELEASE_TABLET | Freq: Once | ORAL | Status: AC
  Administered 2024-10-10: 40 meq via ORAL
  Filled 2024-10-10: qty 2

## 2024-10-10 NOTE — ED Triage Notes (Signed)
 Patient arrives via [facility] with RLQ abdominal cramping for about 3 days, diarrhea, nausea. She was seen by her PCP, given zofran  for symptoms. Appt with GI on the 12th. En route vital s160/100, hr 84, 95% RA, cbg 105

## 2024-10-10 NOTE — Discharge Instructions (Signed)
 You can use the bentyl twice daily for the abdominal pain, you can use the stronger nausea medication, phenergan  as needed for nausea up to every 6 hours.

## 2024-10-10 NOTE — ED Triage Notes (Signed)
 Pt endorses right lower abdominal cramping, diarrhea and nausea for about 3 days. Recent loss of appetite, weight loss over several months without trying. Just finished Cefdinir for resp infection.

## 2024-10-10 NOTE — ED Notes (Signed)
 Pt has decided to leave. She stated she was going to [facility]. She has already called them and app they dont have a wait. I recommended the pt to stay but she wanted to leave. Pts IV removed.

## 2024-10-10 NOTE — ED Provider Notes (Signed)
 [place] EMERGENCY DEPARTMENT AT [facility] Provider Note   CSN: [id number] Arrival date & time: 10/10/24  9080     Patient presents with: Abdominal Pain   [name] is a 43 y.o. female past medical history significant for paroxysmal A-fib, hypertension, bipolar disorder, nausea, vomiting, GERD, diarrhea who presents with concern for abdominal pain, nausea, lack of appetite, diarrhea since Tuesday.  She endorses unintentional weight loss of 30 pounds in 4 months.  She has a follow-up GI appointment next week and has seen her PCP.  Seen at [facility] due to worsening pain, had CT, lab work performed.    Abdominal Pain      Prior to Admission medications   Medication Sig Start Date End Date Taking? Authorizing Provider  albuterol  (PROVENTIL ) (2.5 MG/3ML) 0.083% nebulizer solution Take 3 mLs (2.5 mg total) by nebulization every 6 (six) hours as needed for wheezing or shortness of breath. 02/27/24   Enedelia Dorna HERO, FNP  albuterol  (VENTOLIN  HFA) 108 (90 Base) MCG/ACT inhaler Inhale 2 puffs into the lungs every 4 (four) hours as needed.    [provider]  BREZTRI AEROSPHERE 160-9-4.8 MCG/ACT AERO inhaler Inhale 2 puffs into the lungs 2 (two) times daily.    [provider]  cloNIDine (CATAPRES) 0.1 MG tablet Take 0.1 mg by mouth at bedtime. 04/11/23   [provider]  diltiazem  (CARDIZEM  CD) 180 MG 24 hr capsule Take 1 capsule (180 mg total) by mouth daily. 08/27/24   Shlomo Wilbert SAUNDERS, MD  famotidine  (PEPCID ) 20 MG tablet TAKE 1 TABLET BY MOUTH DAILY AT BEDTIME 07/25/24   Zehr, Jessica D, PA-C  gabapentin  (NEURONTIN ) 300 MG capsule Take 1 capsule (300 mg total) by mouth at bedtime. 11/30/23   Fredirick Glenys RAMAN, MD  ibuprofen  (ADVIL ) 800 MG tablet Take 1 tablet (800 mg total) by mouth every 8 (eight) hours as needed. 04/07/24   Silver Fell A, PA  ipratropium (ATROVENT ) 0.03 % nasal spray Place 2 sprays into both nostrils every 12 (twelve) hours.  02/26/24   Vivienne Delon HERO, PA-C  metoprolol  tartrate (LOPRESSOR ) 50 MG tablet Take 1 tablet (50 mg total) by mouth once for 1 dose. Take 90-120 minutes prior to scan. Hold for SBP less than 110. 06/27/24 06/27/24  Turner, Wilbert SAUNDERS, MD  montelukast  (SINGULAIR ) 10 MG tablet Take 10 mg by mouth daily. 06/27/22   [provider]  pantoprazole  (PROTONIX ) 40 MG tablet Take 1 tablet (40 mg total) by mouth daily. 07/18/22 06/27/24  Kaul, Aseem, MD  sertraline  (ZOLOFT ) 50 MG tablet Take 50 mg by mouth daily. 05/25/24   [provider]  traZODone  (DESYREL ) 100 MG tablet Take 100 mg by mouth at bedtime. 05/24/24   [provider]  XARELTO  20 MG TABS tablet TAKE 1 TABLET(20 MG) BY MOUTH DAILY WITH SUPPER 08/26/24   Shlomo Wilbert SAUNDERS, MD  zolpidem (AMBIEN) 10 MG tablet Take 10 mg by mouth at bedtime as needed. 05/15/23   [provider]    Allergies: Doxycycline , Flexeril  [cyclobenzaprine ], Lamotrigine, and Tramadol     Review of Systems  Gastrointestinal:  Positive for abdominal pain.  All other systems reviewed and are negative.   Updated Vital Signs BP (!) 159/113 (BP Location: Right Arm)   Pulse 90   Temp 98.3 F (36.8 C) (Oral)   Resp 16   LMP 02/26/2015   SpO2 100%   Physical Exam Vitals and nursing note reviewed.  Constitutional:      General: She is  not in acute distress.    Appearance: Normal appearance.  HENT:     Head: Normocephalic and atraumatic.  Eyes:     General:        Right eye: No discharge.        Left eye: No discharge.  Cardiovascular:     Rate and Rhythm: Normal rate and regular rhythm.     Heart sounds: No murmur heard.    No friction rub. No gallop.  Pulmonary:     Effort: Pulmonary effort is normal.     Breath sounds: Normal breath sounds.  Abdominal:     General: Bowel sounds are normal.     Palpations: Abdomen is soft.     Comments: Focal ttp in lower quadrants bilaterally, slightly worse on lower left. No rebound, rigidity,  guarding  Skin:    General: Skin is warm and dry.     Capillary Refill: Capillary refill takes less than 2 seconds.  Neurological:     Mental Status: She is alert and oriented to person, place, and time.  Psychiatric:        Mood and Affect: Mood normal.        Behavior: Behavior normal.     (all labs ordered are listed, but only abnormal results are displayed) Labs Reviewed - No data to display  EKG: None  Radiology: CT ABDOMEN PELVIS W CONTRAST Result Date: 10/10/2024 EXAM: CT ABDOMEN AND PELVIS WITH CONTRAST 10/10/2024 06:09:49 AM TECHNIQUE: CT of the abdomen and pelvis was performed with the administration of 75 mL of iohexol  (OMNIPAQUE ) 350 MG/ML injection. Multiplanar reformatted images are provided for review. Automated exposure control, iterative reconstruction, and/or weight-based adjustment of the mA/kV was utilized to reduce the radiation dose to as low as reasonably achievable. COMPARISON: CT abdomen and pelvis 04/07/2024. CLINICAL HISTORY: 43 year old female. RLQ abdominal pain. FINDINGS: LOWER CHEST: Visible lower chest is negative. LIVER: The liver is unremarkable. GALLBLADDER AND BILE DUCTS: Gallbladder is unremarkable. No biliary ductal dilatation. SPLEEN: Small splenules, normal variant. PANCREAS: No acute abnormality. ADRENAL GLANDS: No acute abnormality. KIDNEYS, URETERS AND BLADDER: No stones in the kidneys or ureters. No hydronephrosis. No perinephric or periureteral stranding. Diminutive bladder. GI AND BOWEL: Stomach decompressed. Most small bowel loops are decompressed, not containing fluid. Fluid throughout nondilated large bowel, with intermittent mild mucosal hyperenhancement throughout the colon. Appearance compatible with diarrhea, mild enterocolitis. Normal gas containing appendix (series 3, image 44). No dilated bowel. PERITONEUM AND RETROPERITONEUM: No ascites. No free air. VASCULATURE: Aorta is normal in caliber. Major arterial structures and the main portal  venous system appear patent and within normal limits. LYMPH NODES: No lymphadenopathy. REPRODUCTIVE ORGANS: Subtle macroscopic fat containing 1.9 cm right adnexal lesion is suspicious for small chronic ovarian dermoid (series 3, image 63 and coronal image 30). BONES AND SOFT TISSUES: Chronic benign sclerotic rim to bone lesion of the medial right iliac bone is unchanged compared to a 2023 CT abdomen and pelvis. No acute osseous abnormality. No focal soft tissue abnormality. No other acute or inflammatory process identified in the abdomen or pelvis. IMPRESSION: 1. Fluid throughout nondilated large bowel with intermittent mild mucosal hyperenhancement, compatible with diarrhea, mild enterocolitis. 2. No other acute or inflammatory process identified.  Normal appendix. 3. Evidence of a 1.9 cm chronic right ovarian dermoid. Recommend follow up with GYN. Electronically signed by: Helayne Hurst MD 10/10/2024 06:38 AM EST RP Workstation: [id number]     Procedures   Medications Ordered in the ED  sodium chloride  0.9 % bolus  1,000 mL (1,000 mLs Intravenous New Bag/Given 10/10/24 1010)  ondansetron  (ZOFRAN ) injection 4 mg (4 mg Intravenous Given 10/10/24 1012)  potassium chloride  SA (KLOR-CON  M) CR tablet 40 mEq (40 mEq Oral Given 10/10/24 1013)  morphine  (PF) 4 MG/ML injection 4 mg (4 mg Intravenous Given 10/10/24 1012)  dicyclomine (BENTYL) capsule 10 mg (10 mg Oral Given 10/10/24 1012)                                    Medical Decision Making Risk Prescription drug management.   This patient is a 43 y.o. female  who presents to the ED for concern of abdominal pain.   Differential diagnoses prior to evaluation: The emergent differential diagnosis includes, but is not limited to,  The causes of generalized abdominal pain include but are not limited to AAA, mesenteric ischemia, appendicitis, diverticulitis, DKA, gastritis, gastroenteritis, AMI, nephrolithiasis, pancreatitis, peritonitis, adrenal  insufficiency,lead poisoning, iron toxicity, intestinal ischemia, constipation, UTI,SBO/LBO, splenic rupture, biliary disease, IBD, IBS, PUD, or hepatitis . This is not an exhaustive differential.   Past Medical History / Co-morbidities / Social History: paroxysmal A-fib, hypertension, bipolar disorder, nausea, vomiting, GERD, diarrhea  Additional history: Chart reviewed. Pertinent results include: Reviewed lab work, imaging from previous emergency department visits  Physical Exam: Physical exam performed. The pertinent findings include: Focal ttp in lower quadrants bilaterally, slightly worse on lower left. No rebound, rigidity, guarding   Lab Tests/Imaging studies: I personally interpreted labs/imaging and the pertinent results include: Reviewed lab work that was performed at 5 AM just prior to patient eloping and coming to our emergency department, but blood cell count is mildly elevated 12.6, with hemoglobin of 15.6, suspect hemoconcentration secondary to dehydration.  CMP notable for hypokalemia potassium 3.2, likely secondary to persistent nausea, vomiting, diarrhea.  Lipase is unremarkable, UA with small ketones to suggest dehydration suspected..  CT abdomen pelvis with contrast shows fluid through large bowel, mild mucosal hyperenhancement compatible with diarrhea, mild enterocolitis.  No other acute or inflammatory process identified.  No evidence of appendicitis, diverticulitis.  I agree with the radiologist interpretation.  Medications: I ordered medication including potassium, Bentyl, morphine , Zofran , fluid bolus, on reassessment patient reports feeling improved, will plan to discharge with Phenergan , Bentyl.  I have reviewed the patients home medicines and have made adjustments as needed.   Disposition: After consideration of the diagnostic results and the patients response to treatment, I feel that patient symptoms consistent with gastroenteritis versus enterocolitis, will plan to  discharge with bentyl, phenergan .   emergency department workup does not suggest an emergent condition requiring admission or immediate intervention beyond what has been performed at this time. The plan is: as above. The patient is safe for discharge and has been instructed to return immediately for worsening symptoms, change in symptoms or any other concerns.   Final diagnoses:  Enterocolitis  Nausea and vomiting, unspecified vomiting type    ED Discharge Orders     None          Rosan Sherlean VEAR DEVONNA 10/10/24 1104    Suzette Pac, MD 10/11/24 [phone]

## 2024-10-10 NOTE — ED Triage Notes (Signed)
 C/o abdominal pain, nausea, has not eaten since Tuesday. Lost 30lbs in 4 months. Has been seen by her PCP for this as well as a GI appt next week. Left [place] this morning and was in the [facility] for hours - had CT w/wo contrast. Also had blood work drawn. Left before being seen. Patient is tearful in triage.

## 2024-10-10 NOTE — ED Provider Triage Note (Signed)
 Emergency Medicine Provider Triage Evaluation Note  [name] , a 43 y.o. female  was evaluated in triage.  Pt complains of patient complaining of 3 days of right lower quadrant abdominal pain.  She states that she began having symptoms including anorexia which is progressed into right lower quadrant abdominal pain.  She also endorses some unintentional weight loss over the past few months..  Review of Systems  Positive:  Negative:   Physical Exam  BP (!) 172/110 (BP Location: Right Arm)   Pulse 87   Temp 98.2 F (36.8 C)   Resp (!) 22   LMP 02/26/2015   SpO2 100%  Gen:   Awake, no distress   Resp:  Normal effort  MSK:   Moves extremities without difficulty  Other:    Medical Decision Making  Medically screening exam initiated at 5:33 AM.  Appropriate orders placed.  Raffaella O Schaller was informed that the remainder of the evaluation will be completed by another provider, this initial triage assessment does not replace that evaluation, and the importance of remaining in the ED until their evaluation is complete.     Logan Ubaldo [place], [place] 10/10/24 [phone]

## 2024-10-16 ENCOUNTER — Ambulatory Visit: Payer: Self-pay | Admitting: Gastroenterology

## 2024-10-16 ENCOUNTER — Other Ambulatory Visit: Payer: Self-pay | Admitting: Gastroenterology

## 2024-10-16 ENCOUNTER — Encounter: Payer: Self-pay | Admitting: Gastroenterology

## 2024-10-16 ENCOUNTER — Ambulatory Visit (INDEPENDENT_AMBULATORY_CARE_PROVIDER_SITE_OTHER): Admitting: Gastroenterology

## 2024-10-16 ENCOUNTER — Other Ambulatory Visit (INDEPENDENT_AMBULATORY_CARE_PROVIDER_SITE_OTHER)

## 2024-10-16 VITALS — BP 90/68 | HR 75 | Ht 60.0 in | Wt 136.4 lb

## 2024-10-16 DIAGNOSIS — R109 Unspecified abdominal pain: Secondary | ICD-10-CM | POA: Diagnosis not present

## 2024-10-16 DIAGNOSIS — R194 Change in bowel habit: Secondary | ICD-10-CM | POA: Diagnosis not present

## 2024-10-16 DIAGNOSIS — R630 Anorexia: Secondary | ICD-10-CM

## 2024-10-16 DIAGNOSIS — Z419 Encounter for procedure for purposes other than remedying health state, unspecified: Secondary | ICD-10-CM | POA: Diagnosis not present

## 2024-10-16 DIAGNOSIS — R110 Nausea: Secondary | ICD-10-CM

## 2024-10-16 LAB — COMPREHENSIVE METABOLIC PANEL WITH GFR
ALT: 12 U/L (ref 0–35)
AST: 12 U/L (ref 0–37)
Albumin: 4 g/dL (ref 3.5–5.2)
Alkaline Phosphatase: 53 U/L (ref 39–117)
BUN: 8 mg/dL (ref 6–23)
CO2: 30 meq/L (ref 19–32)
Calcium: 9.7 mg/dL (ref 8.4–10.5)
Chloride: 105 meq/L (ref 96–112)
Creatinine, Ser: 0.57 mg/dL (ref 0.40–1.20)
GFR: 110.56 mL/min (ref >60.00)
Glucose, Bld: 71 mg/dL (ref 70–99)
Potassium: 4.1 meq/L (ref 3.5–5.1)
Sodium: 139 meq/L (ref 135–145)
Total Bilirubin: 0.3 mg/dL (ref 0.2–1.2)
Total Protein: 6.4 g/dL (ref 6.0–8.3)

## 2024-10-16 LAB — CBC WITH DIFFERENTIAL/PLATELET
Basophils Absolute: 0.1 K/uL (ref 0.0–0.1)
Basophils Relative: 0.9 % (ref 0.0–3.0)
Eosinophils Absolute: 0.1 K/uL (ref 0.0–0.7)
Eosinophils Relative: 1.5 % (ref 0.0–5.0)
HCT: 38.9 % (ref 36.0–46.0)
Hemoglobin: 13.1 g/dL (ref 12.0–15.0)
Lymphocytes Relative: 25.2 % (ref 12.0–46.0)
Lymphs Abs: 1.7 K/uL (ref 0.7–4.0)
MCHC: 33.8 g/dL (ref 30.0–36.0)
MCV: 89.1 fl (ref 78.0–100.0)
Monocytes Absolute: 0.4 K/uL (ref 0.1–1.0)
Monocytes Relative: 6 % (ref 3.0–12.0)
Neutro Abs: 4.6 K/uL (ref 1.4–7.7)
Neutrophils Relative %: 66.4 % (ref 43.0–77.0)
Platelets: 237 K/uL (ref 150.0–400.0)
RBC: 4.36 Mil/uL (ref 3.87–5.11)
RDW: 14.7 % (ref 11.5–15.5)
WBC: 6.8 K/uL (ref 4.0–10.5)

## 2024-10-16 LAB — LIPASE: Lipase: 15 U/L (ref 11.0–59.0)

## 2024-10-16 MED ORDER — PROMETHAZINE HCL 25 MG PO TABS: 25.0000 mg | ORAL_TABLET | Freq: Every day | ORAL | 0 refills | Status: AC | PRN

## 2024-10-16 MED ORDER — HYOSCYAMINE SULFATE 0.125 MG SL SUBL: 0.1250 mg | SUBLINGUAL_TABLET | Freq: Three times a day (TID) | SUBLINGUAL | 1 refills | Status: DC | PRN

## 2024-10-16 NOTE — Progress Notes (Signed)
 Chief Complaint: nausea and unintentional weight loss  Primary GI Doctor: Dr. Charlanne  HPI:  Patient is a 43 year old female patient with past medical history of paroxysmal A-fib, hypertension, bipolar disorder, nausea, vomiting, GERD, diarrhea who was referred to me by Valentin Skates, DO on 07/29/24 for a evaluation of nausea and unintentional weight loss  .    Patient seen in [facility], ED on 10/10/2024 for abdominal pain, nausea, lack of appetite, and diarrhea. CT abdomen pelvis with contrast shows fluid through large bowel, mild mucosal hyperenhancement compatible with diarrhea, mild enterocolitis. No other acute or inflammatory process identified. No evidence of appendicitis, diverticulitis.  White blood count 12.6, hemoglobin 15.6, potassium 3.2, creatinine 0.78, BUN less than 5, lipase 25  Last seen in the GI office on 08/02/2023 by Harlene, PA for nausea, poor appetite and weight loss.  Patient was started on Pepcid  at bedtime in addition to pantoprazole  40 mg daily.  Patient also started on mirtazapine  15 mg at bedtime.  Plans to schedule gastric emptying study to rule out gastroparesis.  Interval History    Patient presents for evaluation of chronic diarrhea, nausea, poor appetite and weight loss.  Patient recently seen in the ED and told she had mild enterocolitis.  Patient was given dicyclomine with no improvement in abdominal pain or diarrhea.Patient can have 2-3 loose stools per day. No urgency. She has intermittent lower abdominal cramping. No blood in stool. She notes possible exposure while riding on bus. No travel. She notes loperamide OTC helps. Patient only able to tolerate clear liquid diet. Reports no appetite. She was prescribed Mirtazapine  last GI office appt and reports no improvement in appetite. She has lost 9lbs since Zarya Lasseigne. She has daily nausea with no vomiting. She is taking promethazine  25 mg po daily. She reports ondansetron  does not help.  Patient also has history of GERD  and taking Protonix  and Pepcid  po daily. No dysphagia.  She was recently was switched from sertraline  to lexapro as she felt  it Dara Beidleman be contributing to her weight loss.  Wt Readings from Last 3 Encounters:  10/16/24 136 lb 6.4 oz (61.9 kg)  06/27/24 144 lb 3.2 oz (65.4 kg)  04/07/24 145 lb (65.8 kg)    Past Medical History:  Diagnosis Date   Anxiety    Aortic insufficiency    Moderate by 2D echo 08/2024   Asthma    Back pain, chronic    Depression    hx of    Dysrhythmia 12/06/2011   hx of a-fib   Ectopic pregnancy    GERD (gastroesophageal reflux disease)    Headache    occasional    Hypertension    was told, but never on meds   Infection    UTI   Ovarian cyst    Urinary tract infection     Past Surgical History:  Procedure Laterality Date   CLEFT PALATE REPAIR     etopical surgery     LAPAROSCOPY FOR ECTOPIC PREGNANCY  2011   TYMPANOPLASTY Right 08/03/2015   Procedure: RIGHT MEDIAL TECHNIQUE TYMPANOPLASTY;  Surgeon: Marlyce Finer, MD;  Location: [facility];  Service: ENT;  Laterality: Right;   VENTRAL HERNIA REPAIR N/A 03/26/2015   Procedure: LAPAROSCOPIC REPAIR OF INCARCERATED SUPERUMBILICAL HERNIA X2 AND UMBILICAL HERNIA REPAIR WITH MESH;  Surgeon: Elspeth Schultze, MD;  Location: [facility];  Service: General;  Laterality: N/A;    Current Outpatient Medications  Medication Sig Dispense Refill   albuterol  (PROVENTIL ) (2.5 MG/3ML)  0.083% nebulizer solution Take 3 mLs (2.5 mg total) by nebulization every 6 (six) hours as needed for wheezing or shortness of breath. 75 mL 12   albuterol  (VENTOLIN  HFA) 108 (90 Base) MCG/ACT inhaler Inhale 2 puffs into the lungs every 4 (four) hours as needed.     BREZTRI AEROSPHERE 160-9-4.8 MCG/ACT AERO inhaler Inhale 2 puffs into the lungs 2 (two) times daily.     cloNIDine (CATAPRES) 0.1 MG tablet Take 0.1 mg by mouth at bedtime.     dicyclomine (BENTYL) 20 MG tablet Take 1 tablet (20 mg total) by mouth 2 (two) times daily. 20  tablet 0   diltiazem  (CARDIZEM  CD) 180 MG 24 hr capsule Take 1 capsule (180 mg total) by mouth daily. 90 capsule 2   escitalopram (LEXAPRO) 10 MG tablet Take 10 mg by mouth every morning.     famotidine  (PEPCID ) 20 MG tablet TAKE 1 TABLET BY MOUTH DAILY AT BEDTIME 30 tablet 5   gabapentin  (NEURONTIN ) 300 MG capsule Take 1 capsule (300 mg total) by mouth at bedtime. 90 capsule 3   HYDROcodone -acetaminophen  (NORCO/VICODIN) 5-325 MG tablet Take 1 tablet by mouth every 6 (six) hours as needed. 10 tablet 0   hyoscyamine (LEVSIN SL) 0.125 MG SL tablet Place 1 tablet (0.125 mg total) under the tongue every 8 (eight) hours as needed for cramping (nausea, diarrhea). 90 tablet 1   ipratropium (ATROVENT ) 0.03 % nasal spray Place 2 sprays into both nostrils every 12 (twelve) hours. 30 mL 0   montelukast  (SINGULAIR ) 10 MG tablet Take 10 mg by mouth daily.     pantoprazole  (PROTONIX ) 40 MG tablet Take 1 tablet (40 mg total) by mouth daily. 30 tablet 0   traZODone  (DESYREL ) 100 MG tablet Take 100 mg by mouth at bedtime.     XARELTO  20 MG TABS tablet TAKE 1 TABLET(20 MG) BY MOUTH DAILY WITH SUPPER 90 tablet 1   zolpidem (AMBIEN) 10 MG tablet Take 10 mg by mouth at bedtime as needed.     promethazine  (PHENERGAN ) 25 MG tablet Take 1 tablet (25 mg total) by mouth daily as needed for nausea or vomiting. 30 tablet 0   No current facility-administered medications for this visit.    Allergies as of 10/16/2024 - Review Complete 10/16/2024  Allergen Reaction Noted   Doxycycline  Diarrhea and Nausea And Vomiting 02/26/2024   Flexeril  [cyclobenzaprine ] Hives 02/02/2015   Lamotrigine Hives 07/17/2022   Tramadol  Hives and Diarrhea 08/20/2019    Family History  Problem Relation Age of Onset   Hypertension Mother    Hypertension Father    Cancer Sister    Heart disease Maternal Grandmother    Cancer Maternal Grandfather    Colon cancer Maternal Grandfather    Other Neg Hx    Hearing loss Neg Hx    Rectal cancer  Neg Hx    Stomach cancer Neg Hx    Esophageal cancer Neg Hx     Review of Systems:    Constitutional: No weight loss, fever, chills, weakness or fatigue HEENT: Eyes: No change in vision               Ears, Nose, Throat:  No change in hearing or congestion Skin: No rash or itching Cardiovascular: No chest pain, chest pressure or palpitations   Respiratory: No SOB or cough Gastrointestinal: See HPI and otherwise negative Genitourinary: No dysuria or change in urinary frequency Neurological: No headache, dizziness or syncope Musculoskeletal: No new muscle or joint pain Hematologic: No bleeding  or bruising Psychiatric: No history of depression or anxiety    Physical Exam:  Vital signs: BP 90/68   Pulse 75   Ht 5' (1.524 m)   Wt 136 lb 6.4 oz (61.9 kg)   LMP 02/26/2015   BMI 26.64 kg/m   Constitutional:   Pleasant  female appears to be in NAD, Well developed, Well nourished, alert and cooperative Throat: Oral cavity and pharynx without inflammation, swelling or lesion.  Respiratory: Respirations even and unlabored. Lungs clear to auscultation bilaterally.   No wheezes, crackles, or rhonchi.  Cardiovascular: Normal S1, S2. Regular rate and rhythm. No peripheral edema, cyanosis or pallor.  Gastrointestinal:  Soft, nondistended, nontender. No rebound or guarding. Normal bowel sounds. No appreciable masses or hepatomegaly. Rectal:  Not performed.  Msk:  Symmetrical without gross deformities. Without edema, no deformity or joint abnormality.  Neurologic:  Alert and  oriented x4;  grossly normal neurologically.  Skin:   Dry and intact without significant lesions or rashes.  RELEVANT LABS AND IMAGING: CBC    Latest Ref Rng & Units 10/10/2024    5:18 AM 04/07/2024   11:28 AM 07/18/2022    1:45 AM  CBC  WBC 4.0 - 10.5 K/uL 12.6  9.0  9.0   Hemoglobin 12.0 - 15.0 g/dL 84.3  85.8  85.7   Hematocrit 36.0 - 46.0 % 44.6  41.9  40.6   Platelets 150 - 400 K/uL 369  367  390      CMP      Latest Ref Rng & Units 10/10/2024    5:18 AM 06/27/2024   10:45 AM 04/07/2024   12:49 PM  CMP  Glucose 70 - 99 mg/dL 95  80  88   BUN 6 - 20 mg/dL 5  7  11    Creatinine 0.44 - 1.00 mg/dL 9.21  9.26  9.41   Sodium 135 - 145 mmol/L 135  140  141   Potassium 3.5 - 5.1 mmol/L 3.2  4.7  4.8   Chloride 98 - 111 mmol/L 100  101  105   CO2 22 - 32 mmol/L 21  22  26    Calcium 8.9 - 10.3 mg/dL 9.9  88.6  9.6   Total Protein 6.5 - 8.1 g/dL 7.5   6.8   Total Bilirubin 0.0 - 1.2 mg/dL 1.0   0.2   Alkaline Phos 38 - 126 U/L 63   70   AST 15 - 41 U/L 16   26   ALT 0 - 44 U/L 16   14      Lab Results  Component Value Date   TSH 2.665 07/17/2022   06/27/2024 echo- Left ventricular ejection fraction, by estimation, is 55 to 60%.   EGD in [place] [facility], 2024 with Dr. Charlanne showed irregular Z-line and gastritis   Colonoscopy [facility] 31, 2024 with Dr. Charlanne showed a 10 mm polyp and a 12 mm polyp that were both removed and minimal diverticulosis as well as internal hemorrhoids.   1. Surgical [P], small bowel - BENIGN SMALL BOWEL MUCOSA WITH NO SIGNIFICANT PATHOLOGIC CHANGES 2. Surgical [P], gastric body and antrum - GASTRIC ANTRAL AND OXYNTIC MUCOSA WITH NO SPECIFIC PATHOLOGIC CHANGES - NEGATIVE FOR H. PYLORI ON H&E STAIN - NEGATIVE FOR INTESTINAL METAPLASIA OR MALIGNANCY 3. Surgical [P], distal esophagus - BENIGN SQUAMOUS MUCOSA WITH MILD REFLUX CHANGES - NEGATIVE FOR INTESTINAL METAPLASIA, DYSPLASIA OR MALIGNANCY 4. Surgical [P], esophageal - SQUAMOUS MUCOSA WITH NO SPECIFIC PATHOLOGIC CHANGES - NEGATIVE FOR INCREASED  INTRAEPITHELIAL EOSINOPHILS - NEGATIVE FOR DYSPLASIA OR MALIGNANCY 5. Surgical [P], random colon - BENIGN COLONIC MUCOSA WITH NO SPECIFIC PATHOLOGIC CHANGES - NEGATIVE FOR INCREASED INTRAEPITHELIAL LYMPHOCYTES OR THICKENED SUBEPITHELIAL COLLAGEN TABLE - NEGATIVE FOR DYSPLASIA OR MALIGNANCY 6. Surgical [P], colon, descending, polyp (1) - TUBULOVILLOUS ADENOMA - NO HIGH-GRADE DYSPLASIA OR  MALIGNANCY 7. Surgical [P], colon, sigmoid, polyp (1) - TUBULAR ADENOMA. - NO HIGH GRADE DYSPLASIA OR MALIGNANCY.   Repeat colonoscopy recommended at 3-year interval.   08/25/2023 gastric emptying study IMPRESSION: No scintigraphic evidence of delayed gastric emptying  04/2024 CTAP IMPRESSION: No acute intra-abdominal or pelvic pathology.  10/2024 CTAP IMPRESSION: 1. Fluid throughout nondilated large bowel with intermittent mild mucosal hyperenhancement, compatible with diarrhea, mild enterocolitis. 2. No other acute or inflammatory process identified.  Normal appendix. 3. Evidence of a 1.9 cm chronic right ovarian dermoid. Recommend follow up with GYN.   Assessment: Encounter Diagnoses  Name Primary?   Abdominal cramping Yes   Altered bowel habits    Nausea without vomiting    Poor appetite     43 year old African-American female patient that presents with chronic issues that includes diarrhea, poor appetite, nausea, and weight loss.  EGD/colon completed in [place] 2024 for same symptoms.  Endoscopy showed gastritis with negative biopsy.  Colonoscopy showed 2 precancerous polyps with recall in 3 years.  Random biopsies negative.  September 2024 gastric emptying study negative.  Patient has had multiple CAT scans over the course of the last year.  CT scan in November showed mild enterocolitis.  Will go ahead and order GI profile with C. difficile to rule out enteric infection.  H pylori diathereix test. Will also recheck lab work as she had elevated WBC and low potassium level.  Patient can continue Imodium as needed.  Will switch patient from dicyclomine to Levsin.  Patient can continue antiemetics as needed.  She recently was switched from sertraline  to Lexapro she felt this Naydeen Speirs have been contributing to some of her symptoms.  At her last GI appointment we prescribed mirtazapine  15 mg at bedtime which she reports did not improve her appetite or symptoms.  Shenequa Howse consider low-dose tricyclic at  next appointment if no improvement.  Plan: - Recheck CBC, CMP, lipase -GI profile with cdiff  - Check h pylori diathereix  -continue pantoprazole  40 mg po daily -continue pepcid   -OTC imodium prn as needed -Will switch to hyoscyamine 0.125 mg prn -continue promethazine  25 mg po daily, refill  Thank you for the courtesy of this consult. Please call me with any questions or concerns.   Dailyn Reith, FNP-C [facility] 10/16/2024, 2:26 PM  Cc: Valentin Skates, DO

## 2024-10-16 NOTE — Patient Instructions (Addendum)
 GERD -continue pantoprazole  40 mg po daily -continue pepcid    Diarrhea -OTC imodium prn as needed -Will switch to hyoscyamine 0.125 mg prn abdominal pain -recommend low fodmap diet  Nausea -continue promethazine  25 mg po daily, refill  Your provider has requested that you go to the basement level for lab work before leaving today. Press B on the elevator. The lab is located at the first door on the left as you exit the elevator.  Your provider has ordered [employer] stool testing for you. You have received a kit from our office today containing all necessary supplies to complete this test. Please carefully read the stool collection instructions provided in the kit before opening the accompanying materials. In addition, be sure there is a label providing your full name and date of birth on the puritan opti-swab tube that is supplied in the kit (if you do not see a label with this information on your test tube, please make us  aware before test collection!). After completing the test, you should secure the purtian tube into the specimen biohazard bag. The [facility] E-Req sheet (including date and time of specimen collection) should be placed into the outside pocket of the specimen biohazard bag and returned to the [facility] lab (basement floor of [facility]) within 3 days of collection. Please make sure to give the specimen to a staff member at the lab. DO NOT leave the specimen on the counter.   If the specimen date and time (can be found in the upper right boxed portion of the sheet) are not filled out on the E-Req sheet, the test will NOT be performed.   _______________________________________________________  If your blood pressure at your visit was 140/90 or greater, please contact your primary care physician to follow up on this.  _______________________________________________________  If you are age 2 or older, your body mass index should be between  23-30. Your Body mass index is 26.64 kg/m. If this is out of the aforementioned range listed, please consider follow up with your Primary Care Provider.  If you are age 8 or younger, your body mass index should be between 19-25. Your Body mass index is 26.64 kg/m. If this is out of the aformentioned range listed, please consider follow up with your Primary Care Provider.   ________________________________________________________  The [facility] providers would like to encourage you to use MYCHART to communicate with providers for non-urgent requests or questions.  Due to long hold times on the telephone, sending your provider a message by [facility] may be a faster and more efficient way to get a response.  Please allow 48 business hours for a response.  Please remember that this is for non-urgent requests.  _______________________________________________________  [facility] is using a team-based approach to care.  Your team is made up of your doctor and two to three APPS. Our APPS (Nurse Practitioners and Physician Assistants) work with your physician to ensure care continuity for you. They are fully qualified to address your health concerns and develop a treatment plan. They communicate directly with your gastroenterologist to care for you. Seeing the Advanced Practice Practitioners on your physician's team can help you by facilitating care more promptly, often allowing for earlier appointments, access to diagnostic testing, procedures, and other specialty referrals.   Thank you for trusting me with your gastrointestinal care. Deanna May, FNP-C

## 2024-10-18 ENCOUNTER — Telehealth: Payer: Self-pay | Admitting: Gastroenterology

## 2024-10-18 NOTE — Telephone Encounter (Signed)
 Inbound call from patient states she is having an allergic reaction to a medication that she was recently prescribed. Patient states when she woke up this morning she had bumps all over her face.   Patient hasn't taken any medications.   Advised her to send picture in her MyChart.   Please advise.

## 2024-10-18 NOTE — Telephone Encounter (Signed)
 This is being addressed. See 11/12 result follow up message for details.

## 2024-10-21 ENCOUNTER — Emergency Department (HOSPITAL_COMMUNITY)

## 2024-10-21 ENCOUNTER — Encounter (HOSPITAL_COMMUNITY): Payer: Self-pay | Admitting: Radiology

## 2024-10-21 ENCOUNTER — Inpatient Hospital Stay (HOSPITAL_COMMUNITY)
Admission: EM | Admit: 2024-10-21 | Discharge: 2024-10-24 | DRG: 392 | Disposition: A | Discharge status: Home / Self Care | Attending: Internal Medicine | Admitting: Internal Medicine

## 2024-10-21 DIAGNOSIS — R1084 Generalized abdominal pain: Principal | ICD-10-CM

## 2024-10-21 DIAGNOSIS — I10 Essential (primary) hypertension: Secondary | ICD-10-CM | POA: Diagnosis present

## 2024-10-21 DIAGNOSIS — Z818 Family history of other mental and behavioral disorders: Secondary | ICD-10-CM

## 2024-10-21 DIAGNOSIS — K219 Gastro-esophageal reflux disease without esophagitis: Secondary | ICD-10-CM | POA: Diagnosis not present

## 2024-10-21 DIAGNOSIS — R1111 Vomiting without nausea: Secondary | ICD-10-CM | POA: Diagnosis not present

## 2024-10-21 DIAGNOSIS — R110 Nausea: Secondary | ICD-10-CM | POA: Diagnosis not present

## 2024-10-21 DIAGNOSIS — Z8249 Family history of ischemic heart disease and other diseases of the circulatory system: Secondary | ICD-10-CM | POA: Diagnosis not present

## 2024-10-21 DIAGNOSIS — I480 Paroxysmal atrial fibrillation: Secondary | ICD-10-CM | POA: Diagnosis not present

## 2024-10-21 DIAGNOSIS — Z87891 Personal history of nicotine dependence: Secondary | ICD-10-CM | POA: Diagnosis not present

## 2024-10-21 DIAGNOSIS — K529 Noninfective gastroenteritis and colitis, unspecified: Principal | ICD-10-CM | POA: Diagnosis present

## 2024-10-21 DIAGNOSIS — I351 Nonrheumatic aortic (valve) insufficiency: Secondary | ICD-10-CM | POA: Diagnosis present

## 2024-10-21 DIAGNOSIS — Z888 Allergy status to other drugs, medicaments and biological substances status: Secondary | ICD-10-CM

## 2024-10-21 DIAGNOSIS — N951 Menopausal and female climacteric states: Secondary | ICD-10-CM | POA: Diagnosis present

## 2024-10-21 DIAGNOSIS — F319 Bipolar disorder, unspecified: Secondary | ICD-10-CM | POA: Diagnosis not present

## 2024-10-21 DIAGNOSIS — R197 Diarrhea, unspecified: Secondary | ICD-10-CM | POA: Diagnosis not present

## 2024-10-21 DIAGNOSIS — D270 Benign neoplasm of right ovary: Secondary | ICD-10-CM | POA: Diagnosis present

## 2024-10-21 DIAGNOSIS — Z881 Allergy status to other antibiotic agents status: Secondary | ICD-10-CM | POA: Diagnosis not present

## 2024-10-21 DIAGNOSIS — Z87730 Personal history of (corrected) cleft lip and palate: Secondary | ICD-10-CM | POA: Diagnosis not present

## 2024-10-21 DIAGNOSIS — E872 Acidosis, unspecified: Secondary | ICD-10-CM | POA: Diagnosis present

## 2024-10-21 DIAGNOSIS — Z79899 Other long term (current) drug therapy: Secondary | ICD-10-CM

## 2024-10-21 DIAGNOSIS — D72829 Elevated white blood cell count, unspecified: Secondary | ICD-10-CM

## 2024-10-21 DIAGNOSIS — R112 Nausea with vomiting, unspecified: Secondary | ICD-10-CM

## 2024-10-21 DIAGNOSIS — R109 Unspecified abdominal pain: Secondary | ICD-10-CM | POA: Diagnosis not present

## 2024-10-21 DIAGNOSIS — Z7901 Long term (current) use of anticoagulants: Secondary | ICD-10-CM

## 2024-10-21 DIAGNOSIS — J45909 Unspecified asthma, uncomplicated: Secondary | ICD-10-CM | POA: Diagnosis not present

## 2024-10-21 DIAGNOSIS — Z885 Allergy status to narcotic agent status: Secondary | ICD-10-CM | POA: Diagnosis not present

## 2024-10-21 LAB — CBC
HCT: 44.5 % (ref 36.0–46.0)
Hemoglobin: 14.8 g/dL (ref 12.0–15.0)
MCH: 30.6 pg (ref 26.0–34.0)
MCHC: 33.3 g/dL (ref 30.0–36.0)
MCV: 92.1 fL (ref 80.0–100.0)
Platelets: 328 K/uL (ref 150–400)
RBC: 4.83 MIL/uL (ref 3.87–5.11)
RDW: 15.3 % (ref 11.5–15.5)
WBC: 21.1 K/uL — ABNORMAL HIGH (ref 4.0–10.5)
nRBC: 0 % (ref 0.0–0.2)

## 2024-10-21 LAB — URINALYSIS, ROUTINE W REFLEX MICROSCOPIC
Bilirubin Urine: NEGATIVE
Glucose, UA: NEGATIVE mg/dL
Hgb urine dipstick: NEGATIVE
Ketones, ur: NEGATIVE mg/dL
Leukocytes,Ua: NEGATIVE
Nitrite: NEGATIVE
Protein, ur: NEGATIVE mg/dL
Specific Gravity, Urine: 1.014 (ref 1.005–1.030)
pH: 7 (ref 5.0–8.0)

## 2024-10-21 LAB — I-STAT CG4 LACTIC ACID, ED
Lactic Acid, Venous: 2.5 mmol/L (ref 0.5–1.9)
Lactic Acid, Venous: 1.9 mmol/L (ref 0.5–1.9)

## 2024-10-21 LAB — COMPREHENSIVE METABOLIC PANEL WITH GFR
ALT: 38 U/L (ref 0–44)
AST: 33 U/L (ref 15–41)
Albumin: 4.4 g/dL (ref 3.5–5.0)
Alkaline Phosphatase: 95 U/L (ref 38–126)
Anion gap: 13 (ref 5–15)
BUN: <5 mg/dL — ABNORMAL LOW (ref 6–20)
CO2: 20 mmol/L — ABNORMAL LOW (ref 22–32)
Calcium: 9.9 mg/dL (ref 8.9–10.3)
Chloride: 107 mmol/L (ref 98–111)
Creatinine, Ser: 0.72 mg/dL (ref 0.44–1.00)
GFR, Estimated: >60 mL/min (ref >60)
Glucose, Bld: 193 mg/dL — ABNORMAL HIGH (ref 70–99)
Potassium: 4 mmol/L (ref 3.5–5.1)
Sodium: 140 mmol/L (ref 135–145)
Total Bilirubin: 0.2 mg/dL (ref 0.0–1.2)
Total Protein: 7.6 g/dL (ref 6.5–8.1)

## 2024-10-21 LAB — LIPASE, BLOOD: Lipase: 18 U/L (ref 11–51)

## 2024-10-21 MED ORDER — SODIUM CHLORIDE 0.9 % IV SOLN
12.5000 mg | Freq: Four times a day (QID) | INTRAVENOUS | Status: DC | PRN
  Administered 2024-10-21 – 2024-10-22 (×2): 12.5 mg via INTRAVENOUS
  Filled 2024-10-21 (×2): qty 12.5

## 2024-10-21 MED ORDER — SODIUM CHLORIDE 0.9% FLUSH
3.0000 mL | Freq: Two times a day (BID) | INTRAVENOUS | Status: DC
  Administered 2024-10-21 – 2024-10-24 (×6): 3 mL via INTRAVENOUS

## 2024-10-21 MED ORDER — ENSURE PLUS HIGH PROTEIN PO LIQD
237.0000 mL | Freq: Two times a day (BID) | ORAL | Status: DC
  Administered 2024-10-22: 237 mL via ORAL

## 2024-10-21 MED ORDER — ZOLPIDEM TARTRATE 5 MG PO TABS
5.0000 mg | ORAL_TABLET | Freq: Every day | ORAL | Status: DC
  Administered 2024-10-21 – 2024-10-23 (×3): 5 mg via ORAL
  Filled 2024-10-21 (×3): qty 1

## 2024-10-21 MED ORDER — PROCHLORPERAZINE EDISYLATE 10 MG/2ML IJ SOLN
10.0000 mg | Freq: Four times a day (QID) | INTRAMUSCULAR | Status: DC | PRN
Start: 2024-10-21 — End: 2024-10-24
  Administered 2024-10-23: 10 mg via INTRAVENOUS
  Filled 2024-10-21: qty 2

## 2024-10-21 MED ORDER — PANTOPRAZOLE SODIUM 40 MG PO TBEC
40.0000 mg | DELAYED_RELEASE_TABLET | Freq: Every day | ORAL | Status: DC
  Administered 2024-10-22 – 2024-10-24 (×3): 40 mg via ORAL
  Filled 2024-10-21 (×3): qty 1

## 2024-10-21 MED ORDER — ONDANSETRON HCL 4 MG/2ML IJ SOLN: 4.0000 mg | Freq: Once | INTRAMUSCULAR | Status: DC | PRN

## 2024-10-21 MED ORDER — LACTATED RINGERS IV BOLUS
1000.0000 mL | Freq: Once | INTRAVENOUS | Status: AC
  Administered 2024-10-21: 1000 mL via INTRAVENOUS

## 2024-10-21 MED ORDER — BUDESON-GLYCOPYRROL-FORMOTEROL 160-9-4.8 MCG/ACT IN AERO
2.0000 | INHALATION_SPRAY | Freq: Two times a day (BID) | RESPIRATORY_TRACT | Status: DC
  Administered 2024-10-21 – 2024-10-24 (×5): 2 via RESPIRATORY_TRACT
  Filled 2024-10-21: qty 5.9

## 2024-10-21 MED ORDER — RIVAROXABAN 10 MG PO TABS
20.0000 mg | ORAL_TABLET | Freq: Every day | ORAL | Status: DC
  Administered 2024-10-22 – 2024-10-24 (×3): 20 mg via ORAL
  Filled 2024-10-21 (×3): qty 2

## 2024-10-21 MED ORDER — SODIUM CHLORIDE 0.9 % IV SOLN
2.0000 g | INTRAVENOUS | Status: DC
  Administered 2024-10-22: 2 g via INTRAVENOUS
  Filled 2024-10-21: qty 20

## 2024-10-21 MED ORDER — SODIUM CHLORIDE (PF) 0.9 % IJ SOLN
INTRAMUSCULAR | Status: AC
  Filled 2024-10-21: qty 50

## 2024-10-21 MED ORDER — GABAPENTIN 100 MG PO CAPS
300.0000 mg | ORAL_CAPSULE | Freq: Every day | ORAL | Status: DC
  Administered 2024-10-21 – 2024-10-23 (×3): 300 mg via ORAL
  Filled 2024-10-21 (×3): qty 3

## 2024-10-21 MED ORDER — ACETAMINOPHEN 325 MG PO TABS
650.0000 mg | ORAL_TABLET | Freq: Four times a day (QID) | ORAL | Status: DC | PRN
  Administered 2024-10-21: 650 mg via ORAL
  Filled 2024-10-21: qty 2

## 2024-10-21 MED ORDER — ALBUTEROL SULFATE (2.5 MG/3ML) 0.083% IN NEBU: 2.5000 mg | INHALATION_SOLUTION | Freq: Four times a day (QID) | RESPIRATORY_TRACT | Status: DC | PRN

## 2024-10-21 MED ORDER — HYDROMORPHONE HCL 1 MG/ML IJ SOLN
1.0000 mg | Freq: Once | INTRAMUSCULAR | Status: AC
  Administered 2024-10-21: 1 mg via INTRAVENOUS
  Filled 2024-10-21: qty 1

## 2024-10-21 MED ORDER — SODIUM CHLORIDE 0.9 % IV SOLN
2.0000 g | Freq: Once | INTRAVENOUS | Status: AC
  Administered 2024-10-21: 2 g via INTRAVENOUS
  Filled 2024-10-21: qty 20

## 2024-10-21 MED ORDER — LACTATED RINGERS IV SOLN: INTRAVENOUS | Status: AC

## 2024-10-21 MED ORDER — METRONIDAZOLE 500 MG/100ML IV SOLN
500.0000 mg | Freq: Once | INTRAVENOUS | Status: AC
  Administered 2024-10-21: 500 mg via INTRAVENOUS
  Filled 2024-10-21: qty 100

## 2024-10-21 MED ORDER — DILTIAZEM HCL ER COATED BEADS 180 MG PO CP24
180.0000 mg | ORAL_CAPSULE | Freq: Every day | ORAL | Status: DC
  Administered 2024-10-22 – 2024-10-24 (×3): 180 mg via ORAL
  Filled 2024-10-21 (×3): qty 1

## 2024-10-21 MED ORDER — HYDROMORPHONE HCL 1 MG/ML IJ SOLN
0.5000 mg | INTRAMUSCULAR | Status: DC | PRN
  Administered 2024-10-21 – 2024-10-22 (×6): 0.5 mg via INTRAVENOUS
  Filled 2024-10-21 (×6): qty 0.5

## 2024-10-21 MED ORDER — TRAZODONE HCL 100 MG PO TABS
100.0000 mg | ORAL_TABLET | Freq: Every day | ORAL | Status: DC
  Administered 2024-10-21 – 2024-10-23 (×3): 100 mg via ORAL
  Filled 2024-10-21 (×3): qty 1

## 2024-10-21 MED ORDER — IOHEXOL 300 MG/ML  SOLN
100.0000 mL | Freq: Once | INTRAMUSCULAR | Status: AC | PRN
  Administered 2024-10-21: 100 mL via INTRAVENOUS

## 2024-10-21 MED ORDER — ACETAMINOPHEN 650 MG RE SUPP
650.0000 mg | Freq: Four times a day (QID) | RECTAL | Status: DC | PRN
Start: 2024-10-21 — End: 2024-10-24

## 2024-10-21 MED ORDER — FAMOTIDINE 20 MG PO TABS
20.0000 mg | ORAL_TABLET | Freq: Every day | ORAL | Status: DC
  Administered 2024-10-21 – 2024-10-23 (×3): 20 mg via ORAL
  Filled 2024-10-21 (×3): qty 1

## 2024-10-21 MED ORDER — METRONIDAZOLE 500 MG/100ML IV SOLN
500.0000 mg | Freq: Two times a day (BID) | INTRAVENOUS | Status: DC
  Administered 2024-10-21 – 2024-10-23 (×4): 500 mg via INTRAVENOUS
  Filled 2024-10-21 (×4): qty 100

## 2024-10-21 MED ORDER — FENTANYL CITRATE (PF) 50 MCG/ML IJ SOSY
50.0000 ug | PREFILLED_SYRINGE | Freq: Once | INTRAMUSCULAR | Status: AC
  Administered 2024-10-21: 50 ug via INTRAVENOUS
  Filled 2024-10-21: qty 1

## 2024-10-21 MED ORDER — ONDANSETRON HCL 4 MG/2ML IJ SOLN
4.0000 mg | Freq: Once | INTRAMUSCULAR | Status: AC
  Administered 2024-10-21: 4 mg via INTRAVENOUS
  Filled 2024-10-21: qty 2

## 2024-10-21 MED ORDER — MELATONIN 3 MG PO TABS: 3.0000 mg | ORAL_TABLET | Freq: Every evening | ORAL | Status: DC | PRN

## 2024-10-21 MED ORDER — ESCITALOPRAM OXALATE 20 MG PO TABS
10.0000 mg | ORAL_TABLET | Freq: Every morning | ORAL | Status: DC
  Administered 2024-10-22 – 2024-10-24 (×3): 10 mg via ORAL
  Filled 2024-10-21 (×3): qty 1

## 2024-10-21 NOTE — ED Notes (Signed)
 Patient states uses marijuana every other day.  Patient is ambulatory and sent to restroom for urine sample.

## 2024-10-21 NOTE — H&P (Addendum)
 History and Physical    Patient: [name] FMW:[id number] DOB: 1981-11-05 DOA: 10/21/2024 DOS: the patient was seen and examined on 10/21/2024 PCP: Valentin Skates, DO  Patient coming from: Home  Chief Complaint:  Chief Complaint  Patient presents with   Abdominal Pain   HPI: [name] is a 43 y.o. female with medical history significant for paroxysmal atrial fibrillation, hypertension, bipolar disorder, and GERD who presents today with cysts abdominal pain and vomiting.  The vomiting is new today.  The patient has been having diarrhea for about 3 weeks.  She was seen in the emergency department a week ago and diagnosed with colitis.  She had a follow-up with her GI doctor the following week.  In the ED and at the GI clinic appointment the patient was given medications just for symptomatic care.  She has not taken any antibiotics in the last month.  She denies fevers though she is going through menopause and sometimes has hot flashes.  She sometimes has chills.  While the diarrhea has been consistent the vomiting that she experienced today was new.  She has had some abdominal pain off and on but with this vomiting she decided to come in for evaluation.  She just feels like she is not getting better at all in the last couple weeks. In the emergency department the patient had a CT scan of her abdomen and pelvis.  It revealed colitis of the transverse and descending colon very similar to the findings from her ED visit couple weeks ago.  She did have an elevated white blood cell count and an elevated lactic acid so the ED our doctor has requested that we admit her overnight for IV antibiotics and pain control.   Review of Systems: As mentioned in the history of present illness. All other systems reviewed and are negative. Past Medical History:  Diagnosis Date   Anxiety    Aortic insufficiency    Moderate by 2D echo 08/2024   Asthma    Back pain, chronic    Depression    hx of     Dysrhythmia 12/06/2011   hx of a-fib   Ectopic pregnancy    GERD (gastroesophageal reflux disease)    Headache    occasional    Hypertension    was told, but never on meds   Infection    UTI   Ovarian cyst    Urinary tract infection    Past Surgical History:  Procedure Laterality Date   CLEFT PALATE REPAIR     etopical surgery     LAPAROSCOPY FOR ECTOPIC PREGNANCY  2011   TYMPANOPLASTY Right 08/03/2015   Procedure: RIGHT MEDIAL TECHNIQUE TYMPANOPLASTY;  Surgeon: Marlyce Finer, MD;  Location: [facility];  Service: ENT;  Laterality: Right;   VENTRAL HERNIA REPAIR N/A 03/26/2015   Procedure: LAPAROSCOPIC REPAIR OF INCARCERATED SUPERUMBILICAL HERNIA X2 AND UMBILICAL HERNIA REPAIR WITH MESH;  Surgeon: Elspeth Schultze, MD;  Location: [facility];  Service: General;  Laterality: N/A;   Social History:  reports that she has quit smoking. Her smoking use included cigarettes. She has a 3.8 pack-year smoking history. She has never used smokeless tobacco. She reports that she does not currently use alcohol after a past usage of about 1.0 standard drink of alcohol per week. She reports that she does not use drugs.  Allergies  Allergen Reactions   Doxycycline  Diarrhea and Nausea And Vomiting   Hydrocodone  Nausea Only   Hyoscyamine Hives   Cyclobenzaprine  Hives  Lamotrigine Hives   Nickel Hives and Rash   Tramadol  Hives and Diarrhea    Family History  Problem Relation Age of Onset   Hypertension Mother    Hypertension Father    Cancer Sister    Heart disease Maternal Grandmother    Cancer Maternal Grandfather    Colon cancer Maternal Grandfather    Other Neg Hx    Hearing loss Neg Hx    Rectal cancer Neg Hx    Stomach cancer Neg Hx    Esophageal cancer Neg Hx     Prior to Admission medications   Medication Sig Start Date End Date Taking? Authorizing Provider  albuterol  (PROVENTIL ) (2.5 MG/3ML) 0.083% nebulizer solution Take 3 mLs (2.5 mg total) by nebulization every 6  (six) hours as needed for wheezing or shortness of breath. 02/27/24  Yes Stanhope, Dorna HERO, FNP  albuterol  (VENTOLIN  HFA) 108 (90 Base) MCG/ACT inhaler Inhale 2 puffs into the lungs every 4 (four) hours as needed for wheezing or shortness of breath.   Yes [provider]  ARTIFICIAL TEARS PF 0.1-0.3 % SOLN Place 1 drop into both eyes 3 (three) times daily as needed (for dryness).   Yes [provider]  BREZTRI AEROSPHERE 160-9-4.8 MCG/ACT AERO inhaler Inhale 2 puffs into the lungs 2 (two) times daily.   Yes [provider]  cloNIDine (CATAPRES) 0.1 MG tablet Take 0.1-0.2 mg by mouth at bedtime. 04/11/23  Yes [provider]  dicyclomine (BENTYL) 20 MG tablet Take 1 tablet (20 mg total) by mouth 2 (two) times daily. 10/10/24  Yes Prosperi, Christian H, PA-C  diltiazem  (CARDIZEM  CD) 180 MG 24 hr capsule Take 1 capsule (180 mg total) by mouth daily. 08/27/24  Yes Turner, Wilbert SAUNDERS, MD  escitalopram (LEXAPRO) 10 MG tablet Take 10 mg by mouth every morning. 10/07/24  Yes [provider]  famotidine  (PEPCID ) 20 MG tablet TAKE 1 TABLET BY MOUTH DAILY AT BEDTIME 07/25/24  Yes Zehr, Jessica D, PA-C  gabapentin  (NEURONTIN ) 300 MG capsule Take 1 capsule (300 mg total) by mouth at bedtime. 11/30/23  Yes Fredirick Glenys RAMAN, MD  ipratropium (ATROVENT ) 0.03 % nasal spray Place 2 sprays into both nostrils every 12 (twelve) hours. Patient taking differently: Place 2 sprays into both nostrils 2 (two) times daily as needed for rhinitis. 02/26/24  Yes Vivienne Delon HERO, PA-C  montelukast  (SINGULAIR ) 10 MG tablet Take 10 mg by mouth at bedtime. 06/27/22  Yes [provider]  pantoprazole  (PROTONIX ) 40 MG tablet Take 1 tablet (40 mg total) by mouth daily. Patient taking differently: Take 40 mg by mouth daily before breakfast. 07/18/22 10/21/24 Yes Kaul, Aseem, MD  promethazine  (PHENERGAN ) 25 MG tablet Take 1 tablet (25 mg total) by mouth daily as needed for nausea or  vomiting. Patient taking differently: Take 25 mg by mouth every 6 (six) hours as needed for nausea or vomiting. 10/16/24  Yes May, Deanna J, NP  traZODone  (DESYREL ) 100 MG tablet Take 100 mg by mouth at bedtime. 05/24/24  Yes [provider]  triamcinolone  ointment (KENALOG) 0.1 % Apply 1 Application topically 2 (two) times daily as needed (for itching).   Yes [provider]  XARELTO  20 MG TABS tablet TAKE 1 TABLET(20 MG) BY MOUTH DAILY WITH SUPPER Patient taking differently: Take 20 mg by mouth daily with breakfast. 08/26/24  Yes Turner, Traci R, MD  zolpidem (AMBIEN) 10 MG tablet Take 10 mg by mouth at bedtime. 05/15/23  Yes [provider]  HYDROcodone -acetaminophen  (NORCO/VICODIN)  5-325 MG tablet Take 1 tablet by mouth every 6 (six) hours as needed. Patient not taking: Reported on 10/21/2024 10/10/24   Prosperi, Christian H, PA-C  hyoscyamine (LEVSIN SL) 0.125 MG SL tablet DISSOLVE 1 TABLET(0.125 MG) UNDER THE TONGUE EVERY 8 HOURS AS NEEDED FOR CRAMPING OR NAUSEA OR DIARRHEA Patient not taking: Reported on 10/21/2024 10/16/24   May, Deanna J, NP    Physical Exam: Vitals:   10/21/24 1400 10/21/24 1630 10/21/24 1645 10/21/24 1830  BP: (!) 161/103 99/66  (!) 120/32  Pulse: 95 93  77  Resp: (!) 24 (!) 28  (!) 26  Temp:   98.1 F (36.7 C)   TempSrc:   Oral   SpO2: 100% 99%  100%   Physical Exam:  General: No acute distress, well developed, well nourished HEENT: Normocephalic, atraumatic, PERRL Cardiovascular: Normal rate and rhythm. Distal pulses intact. Pulmonary: Normal pulmonary effort, normal breath sounds Gastrointestinal: Very distended abdomen, tender mostly in RLQ, hypoactive bowel sounds Musculoskeletal:Normal ROM, no lower ext edema Lymphadenopathy: No cervical LAD. Skin: Skin is warm and dry. Neuro: No focal deficits noted, AAOx3. PSYCH: Attentive and cooperative  Data Reviewed:  Results for orders placed or performed during the hospital  encounter of 10/21/24 (from the past 24 hours)  Lipase, blood     Status: None   Collection Time: 10/21/24 12:07 PM  Result Value Ref Range   Lipase 18 11 - 51 U/L  Comprehensive metabolic panel     Status: Abnormal   Collection Time: 10/21/24 12:07 PM  Result Value Ref Range   Sodium 140 135 - 145 mmol/L   Potassium 4.0 3.5 - 5.1 mmol/L   Chloride 107 98 - 111 mmol/L   CO2 20 (L) 22 - 32 mmol/L   Glucose, Bld 193 (H) 70 - 99 mg/dL   BUN <5 (L) 6 - 20 mg/dL   Creatinine, Ser 9.27 0.44 - 1.00 mg/dL   Calcium 9.9 8.9 - 89.6 mg/dL   Total Protein 7.6 6.5 - 8.1 g/dL   Albumin 4.4 3.5 - 5.0 g/dL   AST 33 15 - 41 U/L   ALT 38 0 - 44 U/L   Alkaline Phosphatase 95 38 - 126 U/L   Total Bilirubin 0.2 0.0 - 1.2 mg/dL   GFR, Estimated >39 >39 mL/min   Anion gap 13 5 - 15  CBC     Status: Abnormal   Collection Time: 10/21/24 12:07 PM  Result Value Ref Range   WBC 21.1 (H) 4.0 - 10.5 K/uL   RBC 4.83 3.87 - 5.11 MIL/uL   Hemoglobin 14.8 12.0 - 15.0 g/dL   HCT 55.4 63.9 - 53.9 %   MCV 92.1 80.0 - 100.0 fL   MCH 30.6 26.0 - 34.0 pg   MCHC 33.3 30.0 - 36.0 g/dL   RDW 84.6 88.4 - 84.4 %   Platelets 328 150 - 400 K/uL   nRBC 0.0 0.0 - 0.2 %  Urinalysis, Routine w reflex microscopic -Urine, Clean Catch     Status: Abnormal   Collection Time: 10/21/24  1:14 PM  Result Value Ref Range   Color, Urine YELLOW YELLOW   APPearance HAZY (A) CLEAR   Specific Gravity, Urine 1.014 1.005 - 1.030   pH 7.0 5.0 - 8.0   Glucose, UA NEGATIVE NEGATIVE mg/dL   Hgb urine dipstick NEGATIVE NEGATIVE   Bilirubin Urine NEGATIVE NEGATIVE   Ketones, ur NEGATIVE NEGATIVE mg/dL   Protein, ur NEGATIVE NEGATIVE mg/dL   Nitrite NEGATIVE NEGATIVE  Leukocytes,Ua NEGATIVE NEGATIVE  Culture, blood (Routine X 2) w Reflex to ID Panel     Status: None (Preliminary result)   Collection Time: 10/21/24  2:00 PM   Specimen: BLOOD RIGHT ARM  Result Value Ref Range   Specimen Description      BLOOD RIGHT ARM Performed at  [facility], [address] [place], [place] [address]    Special Requests      BOTTLES DRAWN AEROBIC AND ANAEROBIC Blood Culture results may not be optimal due to an inadequate volume of blood received in culture bottles Performed at [facility], [address] [place], [place] [address]    Culture PENDING    Report Status PENDING   I-Stat Lactic Acid     Status: Abnormal   Collection Time: 10/21/24  2:09 PM  Result Value Ref Range   Lactic Acid, Venous 2.5 (HH) 0.5 - 1.9 mmol/L   Comment NOTIFIED PHYSICIAN   I-Stat Lactic Acid     Status: None   Collection Time: 10/21/24  5:04 PM  Result Value Ref Range   Lactic Acid, Venous 1.9 0.5 - 1.9 mmol/L    CT of abdomen and pelvis IMPRESSION: 1. Mildly thickened, completely decompressed transverse, descending and sigmoid colon, which may represent sequelae associated with colitis. 2. Hepatic steatosis. 3. Stable right ovarian dermoid  Assessment and Plan: Colitis of the transverse and descending colon - Rocephin and Flagyl  have been started - Full liquid diet  Paroxysmal atrial fibrillation - Continue Xarelto  and Cardizem   Htn -there has been some trouble obtaining accurate blood pressures in the ED.  Her blood pressure has been superhigh and superlow which I do not think are accurate.   - Will make her clonidine as needed instead of scheduled.    Advance Care Planning:   Code Status: Full Code the patient names her mother is her surrogate decision maker and she wants to be full code.  Consults: None  Family Communication: None  Severity of Illness: The appropriate patient status for this patient is INPATIENT. Inpatient status is judged to be reasonable and necessary in order to provide the required intensity of service to ensure the patient's safety. The patient's presenting symptoms, physical exam findings, and initial radiographic and laboratory data in the context of their chronic comorbidities  is felt to place them at high risk for further clinical deterioration. Furthermore, it is not anticipated that the patient will be medically stable for discharge from the hospital within 2 midnights of admission.   * I certify that at the point of admission it is my clinical judgment that the patient will require inpatient hospital care spanning beyond 2 midnights from the point of admission due to high intensity of service, high risk for further deterioration and high frequency of surveillance required.*  Author: ARTHEA CHILD, MD 10/21/2024 6:54 PM  For on call review [email or web address].

## 2024-10-21 NOTE — ED Provider Notes (Addendum)
 [place] EMERGENCY DEPARTMENT AT [facility] Provider Note   CSN: [id number] Arrival date & time: 10/21/24  1142     Patient presents with: Abdominal Pain   [name] is a 43 y.o. female.   Patient with acute onset abdominal pain nausea vomiting diarrhea no blood in either 1.  Multiple episodes of each.  Patient had something similar a week ago.  According to notes patient was seen November 6.  And diagnosed with enteritis.  But will review the CAT scan.  Patient was referred to gastroenterology but has not seen them yet.  Vital signs here temp 98.2 pulse 92 respirations 20 blood pressure 145/92 oxygen  sats 99% on room air.  Past medical history sniffing for ectopic pregnancy ovarian cyst chronic back pain depression headache hypertension dysrhythmia anxiety gastroesophageal reflux disease asthma and aortic insufficiency.  Past surgical history significant for hernia repair in 2016 patient's current smoker.  0.3 packs/day.  Diagnosis from last visit was enterocolitis.       Prior to Admission medications   Medication Sig Start Date End Date Taking? Authorizing Provider  albuterol  (PROVENTIL ) (2.5 MG/3ML) 0.083% nebulizer solution Take 3 mLs (2.5 mg total) by nebulization every 6 (six) hours as needed for wheezing or shortness of breath. 02/27/24   Enedelia Dorna HERO, FNP  albuterol  (VENTOLIN  HFA) 108 (90 Base) MCG/ACT inhaler Inhale 2 puffs into the lungs every 4 (four) hours as needed.    [provider]  BREZTRI AEROSPHERE 160-9-4.8 MCG/ACT AERO inhaler Inhale 2 puffs into the lungs 2 (two) times daily.    [provider]  cloNIDine (CATAPRES) 0.1 MG tablet Take 0.1 mg by mouth at bedtime. 04/11/23   [provider]  dicyclomine (BENTYL) 20 MG tablet Take 1 tablet (20 mg total) by mouth 2 (two) times daily. 10/10/24   Prosperi, Christian H, PA-C  diltiazem  (CARDIZEM  CD) 180 MG 24 hr capsule Take 1 capsule (180 mg total) by mouth daily.  08/27/24   Shlomo Wilbert SAUNDERS, MD  escitalopram (LEXAPRO) 10 MG tablet Take 10 mg by mouth every morning. 10/07/24   [provider]  famotidine  (PEPCID ) 20 MG tablet TAKE 1 TABLET BY MOUTH DAILY AT BEDTIME 07/25/24   Zehr, Jessica D, PA-C  gabapentin  (NEURONTIN ) 300 MG capsule Take 1 capsule (300 mg total) by mouth at bedtime. 11/30/23   Fredirick Glenys RAMAN, MD  HYDROcodone -acetaminophen  (NORCO/VICODIN) 5-325 MG tablet Take 1 tablet by mouth every 6 (six) hours as needed. 10/10/24   Prosperi, Christian H, PA-C  hyoscyamine (LEVSIN SL) 0.125 MG SL tablet DISSOLVE 1 TABLET(0.125 MG) UNDER THE TONGUE EVERY 8 HOURS AS NEEDED FOR CRAMPING OR NAUSEA OR DIARRHEA 10/16/24   May, Deanna J, NP  ipratropium (ATROVENT ) 0.03 % nasal spray Place 2 sprays into both nostrils every 12 (twelve) hours. 02/26/24   Vivienne Delon HERO, PA-C  montelukast  (SINGULAIR ) 10 MG tablet Take 10 mg by mouth daily. 06/27/22   [provider]  pantoprazole  (PROTONIX ) 40 MG tablet Take 1 tablet (40 mg total) by mouth daily. 07/18/22 10/16/24  Kaul, Aseem, MD  promethazine  (PHENERGAN ) 25 MG tablet Take 1 tablet (25 mg total) by mouth daily as needed for nausea or vomiting. 10/16/24   May, Deanna J, NP  traZODone  (DESYREL ) 100 MG tablet Take 100 mg by mouth at bedtime. 05/24/24   [provider]  XARELTO  20 MG TABS tablet TAKE 1 TABLET(20 MG) BY MOUTH DAILY WITH SUPPER 08/26/24   Shlomo Wilbert SAUNDERS, MD  zolpidem (AMBIEN)  10 MG tablet Take 10 mg by mouth at bedtime as needed. 05/15/23   [provider]    Allergies: Doxycycline , Flexeril  [cyclobenzaprine ], Lamotrigine, and Tramadol     Review of Systems  Constitutional:  Negative for chills and fever.  HENT:  Negative for ear pain and sore throat.   Eyes:  Negative for pain and visual disturbance.  Respiratory:  Negative for cough and shortness of breath.   Cardiovascular:  Negative for chest pain and palpitations.  Gastrointestinal:  Positive for abdominal pain,  diarrhea, nausea and vomiting.  Genitourinary:  Negative for dysuria and hematuria.  Musculoskeletal:  Negative for arthralgias and back pain.  Skin:  Negative for color change and rash.  Neurological:  Negative for seizures and syncope.  All other systems reviewed and are negative.   Updated Vital Signs BP (!) 147/112 (BP Location: Right Arm)   Pulse 69   Temp 100.3 F (37.9 C) (Oral)   Resp (!) 23   LMP 02/26/2015   SpO2 99%   Physical Exam Vitals and nursing note reviewed.  Constitutional:      General: She is not in acute distress.    Appearance: Normal appearance. She is well-developed.  HENT:     Head: Normocephalic and atraumatic.  Eyes:     Conjunctiva/sclera: Conjunctivae normal.  Cardiovascular:     Rate and Rhythm: Normal rate and regular rhythm.     Heart sounds: No murmur heard. Pulmonary:     Effort: Pulmonary effort is normal. No respiratory distress.     Breath sounds: Normal breath sounds.  Abdominal:     General: There is distension.     Palpations: Abdomen is soft.     Tenderness: There is abdominal tenderness. There is no guarding.     Comments: Generalized tenderness throughout the abdomen no guarding.  Musculoskeletal:        General: No swelling.     Cervical back: Neck supple.  Skin:    General: Skin is warm and dry.     Capillary Refill: Capillary refill takes less than 2 seconds.  Neurological:     General: No focal deficit present.     Mental Status: She is alert and oriented to person, place, and time.  Psychiatric:        Mood and Affect: Mood normal.     (all labs ordered are listed, but only abnormal results are displayed) Labs Reviewed  COMPREHENSIVE METABOLIC PANEL WITH GFR - Abnormal; Notable for the following components:      Result Value   CO2 20 (*)    Glucose, Bld 193 (*)    BUN <5 (*)    All other components within normal limits  CBC - Abnormal; Notable for the following components:   WBC 21.1 (*)    All other  components within normal limits  URINALYSIS, ROUTINE W REFLEX MICROSCOPIC - Abnormal; Notable for the following components:   APPearance HAZY (*)    All other components within normal limits  I-STAT CG4 LACTIC ACID, ED - Abnormal; Notable for the following components:   Lactic Acid, Venous 2.5 (*)    All other components within normal limits  CULTURE, BLOOD (ROUTINE X 2)  CULTURE, BLOOD (ROUTINE X 2)  LIPASE, BLOOD    EKG: None  Radiology: No results found.   Procedures   Medications Ordered in the ED  promethazine  (PHENERGAN ) 12.5 mg in sodium chloride  0.9 % 50 mL IVPB (0 mg Intravenous Stopped 10/21/24 1406)  HYDROmorphone  (DILAUDID ) injection 1  mg (has no administration in time range)  lactated ringers  bolus 1,000 mL (1,000 mLs Intravenous New Bag/Given 10/21/24 1247)  ondansetron  (ZOFRAN ) injection 4 mg (4 mg Intravenous Given 10/21/24 1249)  fentaNYL  (SUBLIMAZE ) injection 50 mcg (50 mcg Intravenous Given 10/21/24 1247)                                    Medical Decision Making Risk Prescription drug management.   Urinalysis negative.  Lipase normal at 18.  Complete metabolic panel CO2 20 glucose 806 renal function normal LFTs normal.  CBC white count 21,000 hemoglobin 14.8 platelets 328.  Patient has CT scan pending.  CT scan from November 6 showed some mild enterocolitis.  And a right ovarian dermoid cyst.  Will see what scan shows today.  Given a liter of fluid.  Will give her some Zofran  and Phenergan  seems to be helping.  Will give hydromorphone  for pain.  Lactic acid 2.5.  Will continue fluid hydration.  CT scan still pending.  Final diagnoses:  Generalized abdominal pain  Nausea vomiting and diarrhea  Leukocytosis, unspecified type    ED Discharge Orders     None          Geraldene Hamilton, MD 10/21/24 1418    Geraldene Hamilton, MD 10/21/24 1421

## 2024-10-21 NOTE — ED Provider Triage Note (Signed)
 Emergency Medicine Provider Triage Evaluation Note  [name] , a 43 y.o. female  was evaluated in triage.  Pt complains of diffuse abdominal painfor over a week that worsened since then, moainly more today. Continued nausea nad vomiting with chills. Denies any black ro bloody emesis or stool. .  Review of Systems  Positive:  Negative:   Physical Exam  BP (!) 145/92 (BP Location: Left Arm)   Pulse 92   Temp 98.2 F (36.8 C) (Oral)   Resp 20   LMP 02/26/2015   SpO2 99%  Gen:   Awake, retching Resp:  Normal effort  MSK:   Moves extremities without difficulty  Other:    Medical Decision Making  Medically screening exam initiated at 12:18 PM.  Appropriate orders placed.  [name] was informed that the remainder of the evaluation will be completed by another provider, this initial triage assessment does not replace that evaluation, and the importance of remaining in the ED until their evaluation is complete.  Nursing getting IV established now. Labs and imaging ordered   [name], [place] 10/21/24 1227

## 2024-10-21 NOTE — ED Notes (Signed)
 Patient ambulates to restroom without difficulty.  Patient returned from CT and placed back on monitoring equipment.,

## 2024-10-21 NOTE — ED Notes (Signed)
 Unable to obtain second set of blood cultures, have requested  team assist.  First set was obtained from right arm.

## 2024-10-21 NOTE — ED Triage Notes (Addendum)
 Brought by [facility], from home, complaints of abdominal pain, all four quadrants, nausea, vomit and diarrhea, started this morning.  Recently diagnosed Enterocolitis last week.   Patient worse pain than normal, and having cold chills.    SBP 134Palpated 80bpm 18 RR 99% SPO2 RA 218 CBG non-diabetic.   Aox4.

## 2024-10-21 NOTE — ED Provider Notes (Signed)
 patient was initially seen by Dr. Zackowski.  Please see his note.  Patient noted to have significant leukocytosis.  Patient CT abdomen pelvis was pending at the time of shift change.  CT scan shows findings consistent with colitis.  With her persistent pain leukocytosis elevated lactic acid level will start on IV antibiotics and consult for admission.  Case discussed with Dr. Arthea Randol Simmonds, MD 10/21/24 [phone]

## 2024-10-22 ENCOUNTER — Other Ambulatory Visit: Payer: Self-pay

## 2024-10-22 DIAGNOSIS — K529 Noninfective gastroenteritis and colitis, unspecified: Secondary | ICD-10-CM | POA: Diagnosis not present

## 2024-10-22 LAB — BLOOD CULTURE ID PANEL (REFLEXED) - BCID2

## 2024-10-22 LAB — CBC
HCT: 35.6 % — ABNORMAL LOW (ref 36.0–46.0)
Hemoglobin: 11.8 g/dL — ABNORMAL LOW (ref 12.0–15.0)
MCH: 30.1 pg (ref 26.0–34.0)
MCHC: 33.1 g/dL (ref 30.0–36.0)
MCV: 90.8 fL (ref 80.0–100.0)
Platelets: 278 K/uL (ref 150–400)
RBC: 3.92 MIL/uL (ref 3.87–5.11)
RDW: 15.2 % (ref 11.5–15.5)
WBC: 8.4 K/uL (ref 4.0–10.5)
nRBC: 0 % (ref 0.0–0.2)

## 2024-10-22 LAB — COMPREHENSIVE METABOLIC PANEL WITH GFR
ALT: 27 U/L (ref 0–44)
AST: 20 U/L (ref 15–41)
Albumin: 3.7 g/dL (ref 3.5–5.0)
Alkaline Phosphatase: 71 U/L (ref 38–126)
Anion gap: 10 (ref 5–15)
BUN: <5 mg/dL — ABNORMAL LOW (ref 6–20)
CO2: 25 mmol/L (ref 22–32)
Calcium: 9.3 mg/dL (ref 8.9–10.3)
Chloride: 106 mmol/L (ref 98–111)
Creatinine, Ser: 0.5 mg/dL (ref 0.44–1.00)
GFR, Estimated: >60 mL/min (ref >60)
Glucose, Bld: 79 mg/dL (ref 70–99)
Potassium: 3.7 mmol/L (ref 3.5–5.1)
Sodium: 141 mmol/L (ref 135–145)
Total Bilirubin: 0.2 mg/dL (ref 0.0–1.2)
Total Protein: 6.3 g/dL — ABNORMAL LOW (ref 6.5–8.1)

## 2024-10-22 LAB — TSH: TSH: 1.8 u[IU]/mL (ref 0.350–4.500)

## 2024-10-22 NOTE — Progress Notes (Signed)
 PHARMACY - PHYSICIAN COMMUNICATION CRITICAL VALUE ALERT - BLOOD CULTURE IDENTIFICATION (BCID)  [name] is an 43 y.o. female who presented to [facility] on 10/21/2024 with a chief complaint of abdominal pain nausea and diarrhea.    Assessment:   11/17 BCx: 1/4 bottles GPC 11/18 BCID: Staphylococcus species   Name of physician (or Provider) Contacted: Dr Fairy  Current antibiotics: ceftriaxone, metronidazole   Changes to prescribed antibiotics recommended:  Suspect contaminant.  No changes to antibiotics.    Results for orders placed or performed during the hospital encounter of 10/21/24  Blood Culture ID Panel (Reflexed) (Collected: 10/21/2024  2:00 PM)  Result Value Ref Range   Enterococcus faecalis NOT DETECTED NOT DETECTED   Enterococcus Faecium NOT DETECTED NOT DETECTED   Listeria monocytogenes NOT DETECTED NOT DETECTED   Staphylococcus species DETECTED (A) NOT DETECTED   Staphylococcus aureus (BCID) NOT DETECTED NOT DETECTED   Staphylococcus epidermidis NOT DETECTED NOT DETECTED   Staphylococcus lugdunensis NOT DETECTED NOT DETECTED   Streptococcus species NOT DETECTED NOT DETECTED   Streptococcus agalactiae NOT DETECTED NOT DETECTED   Streptococcus pneumoniae NOT DETECTED NOT DETECTED   Streptococcus pyogenes NOT DETECTED NOT DETECTED   A.calcoaceticus-baumannii NOT DETECTED NOT DETECTED   Bacteroides fragilis NOT DETECTED NOT DETECTED   Enterobacterales NOT DETECTED NOT DETECTED   Enterobacter cloacae complex NOT DETECTED NOT DETECTED   Escherichia coli NOT DETECTED NOT DETECTED   Klebsiella aerogenes NOT DETECTED NOT DETECTED   Klebsiella oxytoca NOT DETECTED NOT DETECTED   Klebsiella pneumoniae NOT DETECTED NOT DETECTED   Proteus species NOT DETECTED NOT DETECTED   Salmonella species NOT DETECTED NOT DETECTED   Serratia marcescens NOT DETECTED NOT DETECTED   Haemophilus influenzae NOT DETECTED NOT DETECTED   Neisseria meningitidis NOT DETECTED NOT  DETECTED   Pseudomonas aeruginosa NOT DETECTED NOT DETECTED   Stenotrophomonas maltophilia NOT DETECTED NOT DETECTED   Candida albicans NOT DETECTED NOT DETECTED   Candida auris NOT DETECTED NOT DETECTED   Candida glabrata NOT DETECTED NOT DETECTED   Candida krusei NOT DETECTED NOT DETECTED   Candida parapsilosis NOT DETECTED NOT DETECTED   Candida tropicalis NOT DETECTED NOT DETECTED   Cryptococcus neoformans/gattii NOT DETECTED NOT DETECTED    [name] PharmD, BCPS [facility] main pharmacy [phone] 10/22/2024 11:00 AM

## 2024-10-22 NOTE — Progress Notes (Signed)
 PROGRESS NOTE    [name]  FMW:[id number] DOB: February 09, 1981 DOA: 10/21/2024 PCP: Valentin Skates, DO  43/F with history of paroxysmal A-fib, hypertension, bipolar disorder, GERD was seen in [name] ED 11/6 for abdominal pain nausea and diarrhea, CT showed concern for mild enterocolitis, treated with antibiotics briefly then.  Seen by [facility] as outpatient recently, given medications for symptomatic treatment developed intermittent abdominal pain, and nausea with diarrhea again, yesterday had an episode of vomiting subsequently presented to the ER.  In the ED, initial WBC count was 21K, subsequently normal today, CT noted mildly thickened transverse and sigmoid colon  Subjective: -No further diarrhea since admission, mild right-sided abdominal discomfort  Assessment and Plan:  Subacute abdominal pain - Recently treated for mild enterocolitis - Recurrence of abdominal pain nausea vomiting and diarrhea - CT with mild colitis of transverse and descending colon -EGD and colonoscopy in 5/24, 2 polyps removed, mild gastritis on endoscopy, gastric emptying study 9/24 was negative - Check GI pathogen panel - Empiric ceftriaxone and Flagyl  - Continue full liquids today, advance diet as tolerated  Paroxysmal A-fib Continue Xarelto  and Cardizem   Hypertension - Labile BP, clonidine changed to as needed   DVT prophylaxis: Xarelto  Code Status: Full code Family Communication: None present Disposition Plan: Home tomorrow if stable  Consultants:    Procedures:   Antimicrobials:    Objective: Vitals:   10/21/24 2125 10/22/24 0213 10/22/24 0655 10/22/24 0949  BP:  109/60 (!) 153/96 123/81  Pulse:  93 77 84  Resp:  18 18 16   Temp:  98.3 F (36.8 C) 98.3 F (36.8 C) 98 F (36.7 C)  TempSrc:  Oral Oral Oral  SpO2: 99% 99% 100% 100%  Weight:      Height:        Intake/Output Summary (Last 24 hours) at 10/22/2024 1008 Last data filed at 10/22/2024 0600 Gross per 24  hour  Intake 3423.67 ml  Output --  Net 3423.67 ml   Filed Weights   10/21/24 1951  Weight: 61.4 kg    Examination:  General exam: Appears calm and comfortable  Respiratory system: Clear to auscultation Cardiovascular system: S1 & S2 heard, RRR.  Abd: nondistended, soft and nontender.Normal bowel sounds heard. Central nervous system: Alert and oriented. No focal neurological deficits. Extremities: no edema Skin: No rashes Psychiatry:  Mood & affect appropriate.     Data Reviewed:   CBC: Recent Labs  Lab 10/16/24 1440 10/21/24 1207 10/22/24 0526  WBC 6.8 21.1* 8.4  NEUTROABS 4.6  --   --   HGB 13.1 14.8 11.8*  HCT 38.9 44.5 35.6*  MCV 89.1 92.1 90.8  PLT 237.0 328 278   Basic Metabolic Panel: Recent Labs  Lab 10/16/24 1440 10/21/24 1207 10/22/24 0526  NA 139 140 141  K 4.1 4.0 3.7  CL 105 107 106  CO2 30 20* 25  GLUCOSE 71 193* 79  BUN 8 <5* <5*  CREATININE 0.57 0.72 0.50  CALCIUM 9.7 9.9 9.3   GFR: Estimated Creatinine Clearance: 73.5 mL/min (by C-G formula based on SCr of 0.5 mg/dL). Liver Function Tests: Recent Labs  Lab 10/16/24 1440 10/21/24 1207 10/22/24 0526  AST 12 33 20  ALT 12 38 27  ALKPHOS 53 95 71  BILITOT 0.3 0.2 0.2  PROT 6.4 7.6 6.3*  ALBUMIN 4.0 4.4 3.7   Recent Labs  Lab 10/16/24 1440 10/21/24 1207  LIPASE 15.0 18   No results for input(s): AMMONIA in the last 168 hours. Coagulation  Profile: No results for input(s): INR, PROTIME in the last 168 hours. Cardiac Enzymes: No results for input(s): CKTOTAL, CKMB, CKMBINDEX, TROPONINI in the last 168 hours. BNP (last 3 results) No results for input(s): PROBNP in the last 8760 hours. HbA1C: No results for input(s): HGBA1C in the last 72 hours. CBG: No results for input(s): GLUCAP in the last 168 hours. Lipid Profile: No results for input(s): CHOL, HDL, LDLCALC, TRIG, CHOLHDL, LDLDIRECT in the last 72 hours. Thyroid  Function Tests: Recent  Labs    10/22/24 0526  TSH 1.800   Anemia Panel: No results for input(s): VITAMINB12, FOLATE, FERRITIN, TIBC, IRON, RETICCTPCT in the last 72 hours. Urine analysis:    Component Value Date/Time   COLORURINE YELLOW 10/21/2024 1314   APPEARANCEUR HAZY (A) 10/21/2024 1314   LABSPEC 1.014 10/21/2024 1314   PHURINE 7.0 10/21/2024 1314   GLUCOSEU NEGATIVE 10/21/2024 1314   HGBUR NEGATIVE 10/21/2024 1314   BILIRUBINUR NEGATIVE 10/21/2024 1314   KETONESUR NEGATIVE 10/21/2024 1314   PROTEINUR NEGATIVE 10/21/2024 1314   UROBILINOGEN 0.2 06/06/2015 1014   NITRITE NEGATIVE 10/21/2024 1314   LEUKOCYTESUR NEGATIVE 10/21/2024 1314   Sepsis Labs: @LABRCNTIP (procalcitonin:4,lacticidven:4)  ) Recent Results (from the past 240 hours)  Culture, blood (Routine X 2) w Reflex to ID Panel     Status: None (Preliminary result)   Collection Time: 10/21/24  2:00 PM   Specimen: BLOOD RIGHT ARM  Result Value Ref Range Status   Specimen Description   Final    BLOOD RIGHT ARM Performed at [facility], [address] [place], [place] [address]    Special Requests   Final    BOTTLES DRAWN AEROBIC AND ANAEROBIC Blood Culture results may not be optimal due to an inadequate volume of blood received in culture bottles Performed at [facility], [address], [place], [place] [address]    Culture  Setup Time   Final    GRAM POSITIVE COCCI IN CLUSTERS ANAEROBIC BOTTLE ONLY Organism ID to follow    Culture   Final    NO GROWTH < 24 HOURS Performed at [facility], [address], [place], [place] [address]    Report Status PENDING  Incomplete  Culture, blood (Routine X 2) w Reflex to ID Panel     Status: None (Preliminary result)   Collection Time: 10/21/24  9:16 PM   Specimen: BLOOD  Result Value Ref Range Status   Specimen Description   Final    BLOOD BLOOD LEFT HAND Performed at [facility], [address], [place], [place]  [address]    Special Requests   Final    BOTTLES DRAWN AEROBIC AND ANAEROBIC Blood Culture results may not be optimal due to an inadequate volume of blood received in culture bottles Performed at [facility], [address] [place], [place] [address]    Culture   Final    NO GROWTH < 12 HOURS Performed at [facility], [address], [place], [place] [address]    Report Status PENDING  Incomplete     Radiology Studies: CT ABDOMEN PELVIS W CONTRAST Result Date: 10/21/2024 CLINICAL DATA:  Abdominal pain with nausea, vomiting and diarrhea. EXAM: CT ABDOMEN AND PELVIS WITH CONTRAST TECHNIQUE: Multidetector CT imaging of the abdomen and pelvis was performed using the standard protocol following bolus administration of intravenous contrast. RADIATION DOSE REDUCTION: This exam was performed according to the departmental dose-optimization program which includes automated exposure control, adjustment  of the mA and/or kV according to patient size and/or use of iterative reconstruction technique. CONTRAST:  OMNIPAQUE  IOHEXOL  300 MG/ML  SOLN COMPARISON:  October 10, 2024 FINDINGS: Lower chest: No acute abnormality. Hepatobiliary: There is diffuse fatty infiltration of the liver parenchyma. No focal liver abnormality is seen. No gallstones, gallbladder wall thickening, or biliary dilatation. Pancreas: Unremarkable. No pancreatic ductal dilatation or surrounding inflammatory changes. Spleen: Normal in size without focal abnormality. Adrenals/Urinary Tract: Adrenal glands are unremarkable. Kidneys are normal, without renal calculi, focal lesion, or hydronephrosis. Bladder is unremarkable. Stomach/Bowel: Stomach is within normal limits. Appendix appears normal. No evidence of bowel dilatation. Mildly thickened, completely decompressed transverse, descending and sigmoid colon is seen. Vascular/Lymphatic: No significant vascular findings are present. No enlarged abdominal or pelvic lymph  nodes. Reproductive: A stable 1.6 cm right ovarian dermoid is noted. The uterus and left adnexa are unremarkable. Other: No abdominal wall hernia or abnormality. No abdominopelvic ascites. Musculoskeletal: A stable, chronic benign-appearing bone lesion with sclerotic rim is seen within the right iliac bone. No acute osseous abnormalities are identified IMPRESSION: 1. Mildly thickened, completely decompressed transverse, descending and sigmoid colon, which may represent sequelae associated with colitis. 2. Hepatic steatosis. 3. Stable right ovarian dermoid. Electronically Signed   By: Suzen Dials M.D.   On: 10/21/2024 15:52     Scheduled Meds:  budesonide-glycopyrrolate -formoterol   2 puff Inhalation BID   diltiazem   180 mg Oral Daily   escitalopram  10 mg Oral q morning   famotidine   20 mg Oral QHS   feeding supplement  237 mL Oral BID BM   gabapentin   300 mg Oral QHS   pantoprazole   40 mg Oral QAC breakfast   rivaroxaban   20 mg Oral Q breakfast   sodium chloride  flush  3 mL Intravenous Q12H   traZODone   100 mg Oral QHS   zolpidem  5 mg Oral QHS   Continuous Infusions:  cefTRIAXone (ROCEPHIN)  IV     lactated ringers  125 mL/hr at 10/21/24 1431   metronidazole  500 mg (10/22/24 0830)   promethazine  (PHENERGAN ) injection (IM or IVPB) Stopped (10/21/24 1406)     LOS: 1 day    Time spent:    Sigurd Pac, MD [facility]   10/22/2024, 10:08 AM

## 2024-10-22 NOTE — TOC Initial Note (Signed)
 Transition of Care [facility]) - Initial/Assessment Note    Patient Details  Name: [name] MRN: [id number] Date of Birth: 02-21-1981  [facility]) CM/SW Contact:    Alfonse JONELLE Rex, RN Phone Number: 10/22/2024, 11:27 AM  Clinical Narrative:   Patient admitted from home with c/o abdominal pain, resides in an apartment, has a PCP and insurance on file, has family contacts on file. INPT CM will follow for dc needs.                 Expected Discharge Plan: Home/Self Care Barriers to Discharge: Continued Medical Work up   Patient Goals and CMS Choice Patient states their goals for this hospitalization and ongoing recovery are:: return home          Expected Discharge Plan and Services       Living arrangements for the past 2 months: Apartment                                      Prior Living Arrangements/Services Living arrangements for the past 2 months: Apartment   Patient language and need for interpreter reviewed:: Yes        Need for Family Participation in Patient Care: Yes (Comment) Care giver support system in place?: Yes (comment)   Criminal Activity/Legal Involvement Pertinent to Current Situation/Hospitalization: No - Comment as needed  Activities of Daily Living   ADL Screening (condition at time of admission) Independently performs ADLs?: Yes (appropriate for developmental age) Is the patient deaf or have difficulty hearing?: No Does the patient have difficulty seeing, even when wearing glasses/contacts?: No Does the patient have difficulty concentrating, remembering, or making decisions?: No  Permission Sought/Granted                  Emotional Assessment       Orientation: : Oriented to Self, Oriented to Place, Oriented to  Time, Oriented to Situation Alcohol / Substance Use: Not Applicable Psych Involvement: No (comment)  Admission diagnosis:  Colitis [K52.9] Generalized abdominal pain [R10.84] Nausea vomiting  and diarrhea [R11.2, R19.7] Leukocytosis, unspecified type [D72.829] Patient Active Problem List   Diagnosis Date Noted   Colitis 10/21/2024   Aortic insufficiency    COPD (chronic obstructive pulmonary disease) (HCC) 01/01/2024   Sprain of unspecified ligament of right ankle, initial encounter 12/13/2023   Loss of appetite 08/03/2023   Diarrhea 05/09/2023   Gastroesophageal reflux disease 02/28/2023   Change in bowel habits 02/28/2023   Chronic anticoagulation 02/28/2023   A-fib (HCC) 07/18/2022   Nausea and vomiting 07/17/2022   Hyperglycemia 07/17/2022   Epigastric abdominal pain 07/17/2022   Hiatal hernia 07/17/2022   Closed fracture of right ankle 04/13/2018   Ileus (HCC) 03/28/2015   Incarcerated ventral hernia s/p lap repair w mesh 03/25/2014 03/26/2015   Umbilical hernia s/p lap repair w mesh 03/25/2014 03/26/2015   Bipolar disorder, unspecified (HCC) 09/01/2014   Back muscle spasm 09/01/2014   Smoking 09/01/2014   Elevated BP 09/01/2014   PAF (paroxysmal atrial fibrillation) (HCC) 01/13/2013   Essential hypertension 01/13/2013   PCP:  Valentin Skates, DO Pharmacy:   [facility] [phone] [place], [place] - [address] AT [facility] OF [address] [place] [address] Phone: [phone] Fax: [phone]     Social Drivers of Health (SDOH) Social History: SDOH Screenings   Food Insecurity: No  Food Insecurity (10/21/2024)  Recent Concern: Food Insecurity - Food Insecurity Present (08/05/2024)  Housing: Low Risk  (10/21/2024)  Recent Concern: Housing - High Risk (08/05/2024)  Transportation Needs: No Transportation Needs (10/21/2024)  Recent Concern: Transportation Needs - Unmet Transportation Needs (08/05/2024)  Utilities: Not At Risk (10/21/2024)  Depression (PHQ2-9): High Risk (08/23/2023)  Financial Resource Strain: Medium Risk (08/05/2024)  Physical Activity: Inactive (08/05/2024)  Social Connections: Socially Isolated  (08/05/2024)  Stress: Stress Concern Present (08/05/2024)  Tobacco Use: Medium Risk (10/16/2024)   SDOH Interventions:     Readmission Risk Interventions    10/22/2024   11:25 AM  Readmission Risk Prevention Plan  Transportation Screening Complete  PCP or Specialist Appt within 5-7 Days Complete  Home Care Screening Complete  Medication Review (RN CM) Complete

## 2024-10-23 ENCOUNTER — Encounter (HOSPITAL_COMMUNITY): Payer: Self-pay | Admitting: Internal Medicine

## 2024-10-23 DIAGNOSIS — K529 Noninfective gastroenteritis and colitis, unspecified: Secondary | ICD-10-CM | POA: Diagnosis not present

## 2024-10-23 LAB — GASTROINTESTINAL PANEL BY PCR, STOOL (REPLACES STOOL CULTURE)

## 2024-10-23 LAB — COMPREHENSIVE METABOLIC PANEL WITH GFR
ALT: 29 U/L (ref 0–44)
AST: 20 U/L (ref 15–41)
Albumin: 4.6 g/dL (ref 3.5–5.0)
Alkaline Phosphatase: 82 U/L (ref 38–126)
Anion gap: 13 (ref 5–15)
BUN: <5 mg/dL — ABNORMAL LOW (ref 6–20)
CO2: 27 mmol/L (ref 22–32)
Calcium: 10.1 mg/dL (ref 8.9–10.3)
Chloride: 103 mmol/L (ref 98–111)
Creatinine, Ser: 0.61 mg/dL (ref 0.44–1.00)
GFR, Estimated: >60 mL/min (ref >60)
Glucose, Bld: 73 mg/dL (ref 70–99)
Potassium: 3.4 mmol/L — ABNORMAL LOW (ref 3.5–5.1)
Sodium: 142 mmol/L (ref 135–145)
Total Bilirubin: 0.3 mg/dL (ref 0.0–1.2)
Total Protein: 7.8 g/dL (ref 6.5–8.1)

## 2024-10-23 LAB — CBC
HCT: 43.8 % (ref 36.0–46.0)
Hemoglobin: 14.3 g/dL (ref 12.0–15.0)
MCH: 29.6 pg (ref 26.0–34.0)
MCHC: 32.6 g/dL (ref 30.0–36.0)
MCV: 90.7 fL (ref 80.0–100.0)
Platelets: 314 K/uL (ref 150–400)
RBC: 4.83 MIL/uL (ref 3.87–5.11)
RDW: 15.1 % (ref 11.5–15.5)
WBC: 7.5 K/uL (ref 4.0–10.5)
nRBC: 0 % (ref 0.0–0.2)

## 2024-10-23 MED ORDER — HYDROMORPHONE HCL 1 MG/ML IJ SOLN
0.5000 mg | INTRAMUSCULAR | Status: DC | PRN
  Administered 2024-10-23 (×2): 0.5 mg via INTRAVENOUS
  Filled 2024-10-23 (×2): qty 0.5

## 2024-10-23 MED ORDER — OXYCODONE HCL 5 MG PO TABS
5.0000 mg | ORAL_TABLET | Freq: Four times a day (QID) | ORAL | Status: DC | PRN
Start: 2024-10-23 — End: 2024-10-24
  Administered 2024-10-23 – 2024-10-24 (×4): 5 mg via ORAL
  Filled 2024-10-23 (×4): qty 1

## 2024-10-23 MED ORDER — CEFUROXIME AXETIL 250 MG PO TABS
250.0000 mg | ORAL_TABLET | Freq: Two times a day (BID) | ORAL | Status: DC
  Administered 2024-10-23 – 2024-10-24 (×2): 250 mg via ORAL
  Filled 2024-10-23 (×2): qty 1

## 2024-10-23 MED ORDER — METRONIDAZOLE 500 MG PO TABS
250.0000 mg | ORAL_TABLET | Freq: Two times a day (BID) | ORAL | Status: DC
  Administered 2024-10-23 – 2024-10-24 (×2): 250 mg via ORAL
  Filled 2024-10-23 (×2): qty 1

## 2024-10-23 NOTE — Progress Notes (Addendum)
 PROGRESS NOTE    [name]  FMW:[id number] DOB: [birth year] DOA: 10/21/2024 PCP: Valentin Skates, DO  43/F with history of paroxysmal A-fib, hypertension, bipolar disorder, GERD was seen in [name] ED 11/6 for abdominal pain nausea and diarrhea, CT showed concern for mild enterocolitis, treated with antibiotics briefly then.  Seen by [name] as outpatient recently, given medications for symptomatic treatment developed intermittent abdominal pain, and nausea with diarrhea again, yesterday had an episode of vomiting subsequently presented to the ER.  In the ED, initial WBC count was 21K, subsequently normal today, CT noted mildly thickened transverse and sigmoid colon  Subjective: - Diarrhea has resolved, continues to have abdominal discomfort, overall improving  Assessment and Plan:  Subacute abdominal pain, mild colitis - Recently treated for mild enterocolitis - Recurrence of abdominal pain nausea vomiting and diarrhea - CT with mild colitis of transverse and descending colon -EGD and colonoscopy in 5/24, 2 polyps removed, mild gastritis on endoscopy, gastric emptying study 9/24 was negative - Diarrhea has resolved, GI pathogen panel pending - Change IV ceftriaxone and Flagyl  to oral antibiotics - Advance to regular diet today  Paroxysmal A-fib Continue Xarelto  and Cardizem   Hypertension - Labile BP, clonidine changed to as needed   DVT prophylaxis: Xarelto  Code Status: Full code Family Communication: None present Disposition Plan: Home tomorrow if stable  Consultants:    Procedures:   Antimicrobials:    Objective: Vitals:   10/22/24 1358 10/22/24 2004 10/22/24 2107 10/23/24 0552  BP: 125/73  138/86 (!) 138/93  Pulse: 78  67 75  Resp: 16  18 18   Temp: 98.9 F (37.2 C)  98.4 F (36.9 C) 98.1 F (36.7 C)  TempSrc: Oral  Oral Oral  SpO2: 100% 99% 95% 100%  Weight:      Height:        Intake/Output Summary (Last 24 hours) at 10/23/2024  0920 Last data filed at 10/23/2024 0600 Gross per 24 hour  Intake 2468.13 ml  Output --  Net 2468.13 ml   Filed Weights   10/21/24 1951  Weight: 61.4 kg    Examination:  General exam: Appears calm and comfortable, AO x 3, no distress Respiratory system: Clear to auscultation Cardiovascular system: S1 & S2 heard, RRR.  Abd: Soft, mild right-sided tenderness, nondistended, bowel sounds present Central nervous system: Alert and oriented. No focal neurological deficits. Extremities: no edema Skin: No rashes Psychiatry:  Mood & affect appropriate.     Data Reviewed:   CBC: Recent Labs  Lab 10/16/24 1440 10/21/24 1207 10/22/24 0526 10/23/24 0520  WBC 6.8 21.1* 8.4 7.5  NEUTROABS 4.6  --   --   --   HGB 13.1 14.8 11.8* 14.3  HCT 38.9 44.5 35.6* 43.8  MCV 89.1 92.1 90.8 90.7  PLT 237.0 328 278 314   Basic Metabolic Panel: Recent Labs  Lab 10/16/24 1440 10/21/24 1207 10/22/24 0526 10/23/24 0520  NA 139 140 141 142  K 4.1 4.0 3.7 3.4*  CL 105 107 106 103  CO2 30 20* 25 27  GLUCOSE 71 193* 79 73  BUN 8 <5* <5* <5*  CREATININE 0.57 0.72 0.50 0.61  CALCIUM 9.7 9.9 9.3 10.1   GFR: Estimated Creatinine Clearance: 73.5 mL/min (by C-G formula based on SCr of 0.61 mg/dL). Liver Function Tests: Recent Labs  Lab 10/16/24 1440 10/21/24 1207 10/22/24 0526 10/23/24 0520  AST 12 33 20 20  ALT 12 38 27 29  ALKPHOS 53 95 71 82  BILITOT 0.3 0.2  0.2 0.3  PROT 6.4 7.6 6.3* 7.8  ALBUMIN 4.0 4.4 3.7 4.6   Recent Labs  Lab 10/16/24 1440 10/21/24 1207  LIPASE 15.0 18   No results for input(s): AMMONIA in the last 168 hours. Coagulation Profile: No results for input(s): INR, PROTIME in the last 168 hours. Cardiac Enzymes: No results for input(s): CKTOTAL, CKMB, CKMBINDEX, TROPONINI in the last 168 hours. BNP (last 3 results) No results for input(s): PROBNP in the last 8760 hours. HbA1C: No results for input(s): HGBA1C in the last 72  hours. CBG: No results for input(s): GLUCAP in the last 168 hours. Lipid Profile: No results for input(s): CHOL, HDL, LDLCALC, TRIG, CHOLHDL, LDLDIRECT in the last 72 hours. Thyroid  Function Tests: Recent Labs    10/22/24 0526  TSH 1.800   Anemia Panel: No results for input(s): VITAMINB12, FOLATE, FERRITIN, TIBC, IRON, RETICCTPCT in the last 72 hours. Urine analysis:    Component Value Date/Time   COLORURINE YELLOW 10/21/2024 1314   APPEARANCEUR HAZY (A) 10/21/2024 1314   LABSPEC 1.014 10/21/2024 1314   PHURINE 7.0 10/21/2024 1314   GLUCOSEU NEGATIVE 10/21/2024 1314   HGBUR NEGATIVE 10/21/2024 1314   BILIRUBINUR NEGATIVE 10/21/2024 1314   KETONESUR NEGATIVE 10/21/2024 1314   PROTEINUR NEGATIVE 10/21/2024 1314   UROBILINOGEN 0.2 06/06/2015 1014   NITRITE NEGATIVE 10/21/2024 1314   LEUKOCYTESUR NEGATIVE 10/21/2024 1314   Sepsis Labs: @LABRCNTIP (procalcitonin:4,lacticidven:4)  ) Recent Results (from the past 240 hours)  Culture, blood (Routine X 2) w Reflex to ID Panel     Status: None (Preliminary result)   Collection Time: 10/21/24  2:00 PM   Specimen: BLOOD RIGHT ARM  Result Value Ref Range Status   Specimen Description   Final    BLOOD RIGHT ARM Performed at [facility], [address], [place], [place] [address]    Special Requests   Final    BOTTLES DRAWN AEROBIC AND ANAEROBIC Blood Culture results may not be optimal due to an inadequate volume of blood received in culture bottles Performed at [facility], [address], [place], [place] [address]    Culture  Setup Time   Final    GRAM POSITIVE COCCI IN CLUSTERS IN BOTH AEROBIC AND ANAEROBIC BOTTLES CRITICAL RESULT CALLED TO, READ BACK BY AND VERIFIED WITH: PHARMD N.GLOGOVAC AT 1020 ON 10/22/2024 BY T.SAAD. Performed at [facility], [address], [place], [place] [address]    Culture GRAM POSITIVE COCCI  Final   Report Status PENDING  Incomplete   Blood Culture ID Panel (Reflexed)     Status: Abnormal   Collection Time: 10/21/24  2:00 PM  Result Value Ref Range Status   Enterococcus faecalis NOT DETECTED NOT DETECTED Final   Enterococcus Faecium NOT DETECTED NOT DETECTED Final   Listeria monocytogenes NOT DETECTED NOT DETECTED Final   Staphylococcus species DETECTED (A) NOT DETECTED Final    Comment: CRITICAL RESULT CALLED TO, READ BACK BY AND VERIFIED WITH: PHARMD N.GLOGOVAC AT 1020 ON 10/22/2024 BY T.SAAD.    Staphylococcus aureus (BCID) NOT DETECTED NOT DETECTED Final   Staphylococcus epidermidis NOT DETECTED NOT DETECTED Final   Staphylococcus lugdunensis NOT DETECTED NOT DETECTED Final   Streptococcus species NOT DETECTED NOT DETECTED Final   Streptococcus agalactiae NOT DETECTED NOT DETECTED Final   Streptococcus pneumoniae NOT DETECTED NOT DETECTED Final   Streptococcus pyogenes NOT DETECTED NOT DETECTED Final   A.calcoaceticus-baumannii NOT DETECTED NOT DETECTED Final   Bacteroides fragilis NOT DETECTED NOT DETECTED Final  Enterobacterales NOT DETECTED NOT DETECTED Final   Enterobacter cloacae complex NOT DETECTED NOT DETECTED Final   Escherichia coli NOT DETECTED NOT DETECTED Final   Klebsiella aerogenes NOT DETECTED NOT DETECTED Final   Klebsiella oxytoca NOT DETECTED NOT DETECTED Final   Klebsiella pneumoniae NOT DETECTED NOT DETECTED Final   Proteus species NOT DETECTED NOT DETECTED Final   Salmonella species NOT DETECTED NOT DETECTED Final   Serratia marcescens NOT DETECTED NOT DETECTED Final   Haemophilus influenzae NOT DETECTED NOT DETECTED Final   Neisseria meningitidis NOT DETECTED NOT DETECTED Final   Pseudomonas aeruginosa NOT DETECTED NOT DETECTED Final   Stenotrophomonas maltophilia NOT DETECTED NOT DETECTED Final   Candida albicans NOT DETECTED NOT DETECTED Final   Candida auris NOT DETECTED NOT DETECTED Final   Candida glabrata NOT DETECTED NOT DETECTED Final   Candida krusei NOT DETECTED NOT  DETECTED Final   Candida parapsilosis NOT DETECTED NOT DETECTED Final   Candida tropicalis NOT DETECTED NOT DETECTED Final   Cryptococcus neoformans/gattii NOT DETECTED NOT DETECTED Final    Comment: Performed at [facility], [address], [place], [place] [address]  Culture, blood (Routine X 2) w Reflex to ID Panel     Status: None (Preliminary result)   Collection Time: 10/21/24  9:16 PM   Specimen: BLOOD  Result Value Ref Range Status   Specimen Description   Final    BLOOD BLOOD LEFT HAND Performed at [facility], [address] [place], [place] [address]    Special Requests   Final    BOTTLES DRAWN AEROBIC AND ANAEROBIC Blood Culture results may not be optimal due to an inadequate volume of blood received in culture bottles Performed at [facility], [address] [place], [place] [address]    Culture   Final    NO GROWTH 1 DAY Performed at [facility], [address] [place], [place] [address]    Report Status PENDING  Incomplete  Gastrointestinal Panel by PCR , Stool     Status: None   Collection Time: 10/22/24 10:45 AM   Specimen: Stool  Result Value Ref Range Status   Campylobacter species NOT DETECTED NOT DETECTED Final   Plesimonas shigelloides NOT DETECTED NOT DETECTED Final   Salmonella species NOT DETECTED NOT DETECTED Final   Yersinia enterocolitica NOT DETECTED NOT DETECTED Final   Vibrio species NOT DETECTED NOT DETECTED Final   Vibrio cholerae NOT DETECTED NOT DETECTED Final   Enteroaggregative E coli (EAEC) NOT DETECTED NOT DETECTED Final   Enteropathogenic E coli (EPEC) NOT DETECTED NOT DETECTED Final   Enterotoxigenic E coli (ETEC) NOT DETECTED NOT DETECTED Final   Shiga like toxin producing E coli (STEC) NOT DETECTED NOT DETECTED Final   Shigella/Enteroinvasive E coli (EIEC) NOT DETECTED NOT DETECTED Final   Cryptosporidium NOT DETECTED NOT DETECTED Final   Cyclospora cayetanensis NOT DETECTED NOT  DETECTED Final   Entamoeba histolytica NOT DETECTED NOT DETECTED Final   Giardia lamblia NOT DETECTED NOT DETECTED Final   Adenovirus F40/41 NOT DETECTED NOT DETECTED Final   Astrovirus NOT DETECTED NOT DETECTED Final   Norovirus GI/GII NOT DETECTED NOT DETECTED Final   Rotavirus A NOT DETECTED NOT DETECTED Final   Sapovirus (I, II, IV, and V) NOT DETECTED NOT DETECTED Final    Comment: Performed at [facility], [address] [place], [place] [address]     Radiology Studies: CT ABDOMEN PELVIS W CONTRAST Result Date: 10/21/2024 CLINICAL DATA:  Abdominal pain with nausea, vomiting and diarrhea. EXAM: CT ABDOMEN AND PELVIS WITH CONTRAST TECHNIQUE: Multidetector CT imaging of the abdomen and pelvis was performed using the standard protocol following bolus administration of intravenous contrast. RADIATION DOSE REDUCTION: This exam was performed according to the departmental dose-optimization program which includes automated exposure control, adjustment of the mA and/or kV according to patient size and/or use of iterative reconstruction technique. CONTRAST:  OMNIPAQUE  IOHEXOL  300 MG/ML  SOLN COMPARISON:  October 10, 2024 FINDINGS: Lower chest: No acute abnormality. Hepatobiliary: There is diffuse fatty infiltration of the liver parenchyma. No focal liver abnormality is seen. No gallstones, gallbladder wall thickening, or biliary dilatation. Pancreas: Unremarkable. No pancreatic ductal dilatation or surrounding inflammatory changes. Spleen: Normal in size without focal abnormality. Adrenals/Urinary Tract: Adrenal glands are unremarkable. Kidneys are normal, without renal calculi, focal lesion, or hydronephrosis. Bladder is unremarkable. Stomach/Bowel: Stomach is within normal limits. Appendix appears normal. No evidence of bowel dilatation. Mildly thickened, completely decompressed transverse, descending and sigmoid colon is seen. Vascular/Lymphatic: No significant vascular findings are  present. No enlarged abdominal or pelvic lymph nodes. Reproductive: A stable 1.6 cm right ovarian dermoid is noted. The uterus and left adnexa are unremarkable. Other: No abdominal wall hernia or abnormality. No abdominopelvic ascites. Musculoskeletal: A stable, chronic benign-appearing bone lesion with sclerotic rim is seen within the right iliac bone. No acute osseous abnormalities are identified IMPRESSION: 1. Mildly thickened, completely decompressed transverse, descending and sigmoid colon, which may represent sequelae associated with colitis. 2. Hepatic steatosis. 3. Stable right ovarian dermoid. Electronically Signed   By: Suzen Dials M.D.   On: 10/21/2024 15:52     Scheduled Meds:  budesonide -glycopyrrolate -formoterol   2 puff Inhalation BID   diltiazem   180 mg Oral Daily   escitalopram   10 mg Oral q morning   famotidine   20 mg Oral QHS   gabapentin   300 mg Oral QHS   pantoprazole   40 mg Oral QAC breakfast   rivaroxaban   20 mg Oral Q breakfast   sodium chloride  flush  3 mL Intravenous Q12H   traZODone   100 mg Oral QHS   zolpidem   5 mg Oral QHS   Continuous Infusions:  cefTRIAXone  (ROCEPHIN )  IV 2 g (10/22/24 1715)   metronidazole  500 mg (10/23/24 0854)   promethazine  (PHENERGAN ) injection (IM or IVPB) 12.5 mg (10/22/24 1150)     LOS: 2 days    Time spent:    Sigurd Pac, MD [facility]   10/23/2024, 9:20 AM

## 2024-10-23 NOTE — Plan of Care (Signed)

## 2024-10-24 ENCOUNTER — Other Ambulatory Visit: Payer: Self-pay

## 2024-10-24 ENCOUNTER — Ambulatory Visit: Admitting: Obstetrics and Gynecology

## 2024-10-24 ENCOUNTER — Encounter: Payer: Self-pay | Admitting: Obstetrics and Gynecology

## 2024-10-24 VITALS — BP 137/93 | HR 91 | Wt 128.3 lb

## 2024-10-24 DIAGNOSIS — N958 Other specified menopausal and perimenopausal disorders: Secondary | ICD-10-CM

## 2024-10-24 DIAGNOSIS — N951 Menopausal and female climacteric states: Secondary | ICD-10-CM

## 2024-10-24 DIAGNOSIS — E2839 Other primary ovarian failure: Secondary | ICD-10-CM

## 2024-10-24 DIAGNOSIS — K529 Noninfective gastroenteritis and colitis, unspecified: Secondary | ICD-10-CM | POA: Diagnosis not present

## 2024-10-24 LAB — MISC LABCORP TEST (SEND OUT): Labcorp test code: 83935

## 2024-10-24 LAB — CULTURE, BLOOD (ROUTINE X 2)

## 2024-10-24 MED ORDER — ESTRADIOL 0.01 % VA CREA: TOPICAL_CREAM | VAGINAL | 12 refills | Status: AC

## 2024-10-24 MED ORDER — METRONIDAZOLE 250 MG PO TABS: 250.0000 mg | ORAL_TABLET | Freq: Two times a day (BID) | ORAL | 0 refills | Status: AC

## 2024-10-24 MED ORDER — CEFUROXIME AXETIL 250 MG PO TABS: 250.0000 mg | ORAL_TABLET | Freq: Two times a day (BID) | ORAL | 0 refills | Status: AC

## 2024-10-24 NOTE — Progress Notes (Signed)
 GYNECOLOGY VISIT  Patient name: [name] MRN [id number]  Date of birth: 1981/04/13 Chief Complaint:   No chief complaint on file.  History:  [name] not helpful with the gabapentin . Given prescription   Not a current smoker, no HTN and no diagnosis of diabetes. 12 years since LMP. Typically well controlled blood pressure and HR with the cardizem . Reports was on hormone therapy for a while when initially diagnosed - was on both estrogen and progesterone, this was prior to being on cardizem .   Discount card with [employer] - was told had to have commercial insurance. [employer] helped when taking the 7 day sample.   Started on lexapro  in the last week. Had been on sertraline  and had significant weight changes. Still having hot flash, all day, worse at night.   Starting nursing school at [employer]  Has vaginal dryness as well.   Previously prescribed 300mg  gabapentin  for hot flashes. Feels the host flashes have gotten worse. Had it under control and then it all stopped working suddently.    The following portions of the patient's history were reviewed and updated as appropriate: allergies, current medications, past family history, past medical history, past social history, past surgical history and problem list.   Health Maintenance:   Last pap     Component Value Date/Time   DIAGPAP  08/23/2023 1140    - Negative for intraepithelial lesion or malignancy (NILM)   HPVHIGH Negative 08/23/2023 1140   ADEQPAP  08/23/2023 1140    Satisfactory for evaluation; transformation zone component PRESENT.    Health Maintenance  Topic Date Due   COVID-19 Vaccine (1) Never done   Hepatitis C Screening  Never done   DTaP/Tdap/Td vaccine (1 - Tdap) Never done   Pneumococcal Vaccine (1 of 2 - PCV) Never done   Hepatitis B Vaccine (1 of 3 - 19+ 3-dose series) Never done   HPV Vaccine (1 - 3-dose SCDM series) Never done   Breast Cancer Screening  Never done   Flu Shot  07/05/2024    Colon Cancer Screening  05/04/2026   Pap with HPV screening  08/22/2028   HIV Screening  Completed   Meningitis B Vaccine  Aged Out      Review of Systems:  Pertinent items are noted in HPI. Comprehensive review of systems was otherwise negative.   Objective:  Physical Exam LMP 02/26/2015    Physical Exam   Labs and Imaging CT ABDOMEN PELVIS W CONTRAST Result Date: 10/21/2024 CLINICAL DATA:  Abdominal pain with nausea, vomiting and diarrhea. EXAM: CT ABDOMEN AND PELVIS WITH CONTRAST TECHNIQUE: Multidetector CT imaging of the abdomen and pelvis was performed using the standard protocol following bolus administration of intravenous contrast. RADIATION DOSE REDUCTION: This exam was performed according to the departmental dose-optimization program which includes automated exposure control, adjustment of the mA and/or kV according to patient size and/or use of iterative reconstruction technique. CONTRAST:  OMNIPAQUE  IOHEXOL  300 MG/ML  SOLN COMPARISON:  October 10, 2024 FINDINGS: Lower chest: No acute abnormality. Hepatobiliary: There is diffuse fatty infiltration of the liver parenchyma. No focal liver abnormality is seen. No gallstones, gallbladder wall thickening, or biliary dilatation. Pancreas: Unremarkable. No pancreatic ductal dilatation or surrounding inflammatory changes. Spleen: Normal in size without focal abnormality. Adrenals/Urinary Tract: Adrenal glands are unremarkable. Kidneys are normal, without renal calculi, focal lesion, or hydronephrosis. Bladder is unremarkable. Stomach/Bowel: Stomach is within normal limits. Appendix appears normal. No evidence of bowel dilatation. Mildly thickened, completely decompressed transverse,  descending and sigmoid colon is seen. Vascular/Lymphatic: No significant vascular findings are present. No enlarged abdominal or pelvic lymph nodes. Reproductive: A stable 1.6 cm right ovarian dermoid is noted. The uterus and left adnexa are unremarkable.  Other: No abdominal wall hernia or abnormality. No abdominopelvic ascites. Musculoskeletal: A stable, chronic benign-appearing bone lesion with sclerotic rim is seen within the right iliac bone. No acute osseous abnormalities are identified IMPRESSION: 1. Mildly thickened, completely decompressed transverse, descending and sigmoid colon, which may represent sequelae associated with colitis. 2. Hepatic steatosis. 3. Stable right ovarian dermoid. Electronically Signed   By: Suzen Dials M.D.   On: 10/21/2024 15:52   CT ABDOMEN PELVIS W CONTRAST Result Date: 10/10/2024 EXAM: CT ABDOMEN AND PELVIS WITH CONTRAST 10/10/2024 06:09:49 AM TECHNIQUE: CT of the abdomen and pelvis was performed with the administration of 75 mL of iohexol  (OMNIPAQUE ) 350 MG/ML injection. Multiplanar reformatted images are provided for review. Automated exposure control, iterative reconstruction, and/or weight-based adjustment of the mA/kV was utilized to reduce the radiation dose to as low as reasonably achievable. COMPARISON: CT abdomen and pelvis 04/07/2024. CLINICAL HISTORY: 43 year old female. RLQ abdominal pain. FINDINGS: LOWER CHEST: Visible lower chest is negative. LIVER: The liver is unremarkable. GALLBLADDER AND BILE DUCTS: Gallbladder is unremarkable. No biliary ductal dilatation. SPLEEN: Small splenules, normal variant. PANCREAS: No acute abnormality. ADRENAL GLANDS: No acute abnormality. KIDNEYS, URETERS AND BLADDER: No stones in the kidneys or ureters. No hydronephrosis. No perinephric or periureteral stranding. Diminutive bladder. GI AND BOWEL: Stomach decompressed. Most small bowel loops are decompressed, not containing fluid. Fluid throughout nondilated large bowel, with intermittent mild mucosal hyperenhancement throughout the colon. Appearance compatible with diarrhea, mild enterocolitis. Normal gas containing appendix (series 3, image 44). No dilated bowel. PERITONEUM AND RETROPERITONEUM: No ascites. No free air.  VASCULATURE: Aorta is normal in caliber. Major arterial structures and the main portal venous system appear patent and within normal limits. LYMPH NODES: No lymphadenopathy. REPRODUCTIVE ORGANS: Subtle macroscopic fat containing 1.9 cm right adnexal lesion is suspicious for small chronic ovarian dermoid (series 3, image 63 and coronal image 30). BONES AND SOFT TISSUES: Chronic benign sclerotic rim to bone lesion of the medial right iliac bone is unchanged compared to a 2023 CT abdomen and pelvis. No acute osseous abnormality. No focal soft tissue abnormality. No other acute or inflammatory process identified in the abdomen or pelvis. IMPRESSION: 1. Fluid throughout nondilated large bowel with intermittent mild mucosal hyperenhancement, compatible with diarrhea, mild enterocolitis. 2. No other acute or inflammatory process identified.  Normal appendix. 3. Evidence of a 1.9 cm chronic right ovarian dermoid. Recommend follow up with GYN. Electronically signed by: Helayne Hurst MD 10/10/2024 06:38 AM EST RP Workstation: [id number]       Assessment & Plan:   1. Vasomotor symptoms due to menopause (Primary) 2. Premature ovarian failure 3. Genitourinary syndrome of menopause Labs today for workup to rule out other causes given worsening of symptoms. Normal TSH recently. Start vaginal estrogen for GSM symptoms, reviewed may take several weeks to see full effect. Will reach out to cardiologist regarding CVD risk in light of PAF; based on parameters of ASCVD calculator, low risk and can consider hormone therapy but unclear how much her afib affects her risk with use of hormone therapy, otherwise would not be unreasonable to start hormone therapy. Noted that it would be combination estrogen and progesterone.  - FSH - Hemoglobin A1c    Carter Quarry, MD [facility], [facility]

## 2024-10-24 NOTE — Discharge Summary (Signed)
 Physician Discharge Summary  [name] FMW:[id number] DOB: May 06, 1981 DOA: 10/21/2024  PCP: Valentin Skates, DO  Admit date: 10/21/2024 Discharge date: 10/24/2024  Time spent: 45 minutes  Recommendations for Outpatient Follow-up:  [facility] in 1 month   Discharge Diagnoses:  Principal Problem:   Colitis Active Problems:   PAF (paroxysmal atrial fibrillation) (HCC)   Essential hypertension   Chronic anticoagulation   Aortic insufficiency   Discharge Condition: Improved  Diet recommendation: Sodium, heart healthy  Filed Weights   10/21/24 1951  Weight: 61.4 kg    History of present illness:  43/F with history of paroxysmal A-fib, hypertension, bipolar disorder, GERD was seen in [name] ED 11/6 for abdominal pain nausea and diarrhea, CT showed concern for mild enterocolitis, treated with antibiotics briefly then.  Seen by [name] as outpatient recently, given medications for symptomatic treatment developed intermittent abdominal pain, and nausea with diarrhea again, yesterday had an episode of vomiting subsequently presented to the ER.  In the ED, initial WBC count was 21K, subsequently normal today, CT noted mildly thickened transverse and sigmoid colon    Hospital Course:   Subacute abdominal pain, mild colitis - Recently treated for mild enterocolitis - Recurrence of abdominal pain nausea vomiting and diarrhea - CT with mild colitis of transverse and descending colon -EGD and colonoscopy in 5/24, 2 polyps removed, mild gastritis on endoscopy, gastric emptying study 9/24 was negative - Diarrhea has resolved, GI pathogen panel negative - Changed IV ceftriaxone and Flagyl  to oral antibiotics to complete 7-day course - Tolerating regular diet now, advised modification of her diet, increasing fiber, cutting out processed food etc.   Paroxysmal A-fib Continue Xarelto  and Cardizem    Hypertension - Labile BP, clonidine discontinued  Discharge  Exam: Vitals:   10/24/24 0624 10/24/24 0757  BP: 122/82   Pulse: 83   Resp: 18   Temp: 98.1 F (36.7 C)   SpO2: 99% 99%   Gen: Awake, Alert, Oriented X 3,  HEENT: no JVD Lungs: Good air movement bilaterally, CTAB CVS: S1S2/RRR Abd: soft, Non tender, non distended, BS present Extremities: No edema Skin: no new rashes on exposed skin   Discharge Instructions   Discharge Instructions     Diet - low sodium heart healthy   Complete by: As directed    Increase activity slowly   Complete by: As directed       Allergies as of 10/24/2024       Reactions   Doxycycline  Diarrhea, Nausea And Vomiting   Hydrocodone  Nausea Only   Hyoscyamine Hives   Cyclobenzaprine  Hives   Lamotrigine Hives   Nickel Hives, Rash   Tramadol  Hives, Diarrhea        Medication List     STOP taking these medications    cloNIDine 0.1 MG tablet Commonly known as: CATAPRES   famotidine  20 MG tablet Commonly known as: PEPCID    HYDROcodone -acetaminophen  5-325 MG tablet Commonly known as: NORCO/VICODIN   hyoscyamine 0.125 MG SL tablet Commonly known as: LEVSIN SL       TAKE these medications    albuterol  108 (90 Base) MCG/ACT inhaler Commonly known as: VENTOLIN  HFA Inhale 2 puffs into the lungs every 4 (four) hours as needed for wheezing or shortness of breath.   albuterol  (2.5 MG/3ML) 0.083% nebulizer solution Commonly known as: PROVENTIL  Take 3 mLs (2.5 mg total) by nebulization every 6 (six) hours as needed for wheezing or shortness of breath.   Artificial Tears PF 0.1-0.3 % Soln Generic drug: Dextran  70-Hypromellose (PF) Place 1 drop into both eyes 3 (three) times daily as needed (for dryness).   Breztri  Aerosphere 160-9-4.8 MCG/ACT Aero inhaler Generic drug: budesonide -glycopyrrolate -formoterol  Inhale 2 puffs into the lungs 2 (two) times daily.   cefUROXime  250 MG tablet Commonly known as: CEFTIN  Take 1 tablet (250 mg total) by mouth 2 (two) times daily with a meal for 3  days.   dicyclomine  20 MG tablet Commonly known as: BENTYL  Take 1 tablet (20 mg total) by mouth 2 (two) times daily.   diltiazem  180 MG 24 hr capsule Commonly known as: CARDIZEM  CD Take 1 capsule (180 mg total) by mouth daily.   escitalopram  10 MG tablet Commonly known as: LEXAPRO  Take 10 mg by mouth every morning.   gabapentin  300 MG capsule Commonly known as: Neurontin  Take 1 capsule (300 mg total) by mouth at bedtime.   ipratropium 0.03 % nasal spray Commonly known as: ATROVENT  Place 2 sprays into both nostrils every 12 (twelve) hours. What changed:  when to take this reasons to take this   metroNIDAZOLE  250 MG tablet Commonly known as: FLAGYL  Take 1 tablet (250 mg total) by mouth every 12 (twelve) hours for 3 days.   montelukast  10 MG tablet Commonly known as: SINGULAIR  Take 10 mg by mouth at bedtime.   pantoprazole  40 MG tablet Commonly known as: PROTONIX  Take 1 tablet (40 mg total) by mouth daily. What changed: when to take this   promethazine  25 MG tablet Commonly known as: PHENERGAN  Take 1 tablet (25 mg total) by mouth daily as needed for nausea or vomiting. What changed: when to take this   traZODone  100 MG tablet Commonly known as: DESYREL  Take 100 mg by mouth at bedtime.   triamcinolone  ointment 0.1 % Commonly known as: KENALOG Apply 1 Application topically 2 (two) times daily as needed (for itching).   Xarelto  20 MG Tabs tablet Generic drug: rivaroxaban  TAKE 1 TABLET(20 MG) BY MOUTH DAILY WITH SUPPER What changed: See the new instructions.   zolpidem  10 MG tablet Commonly known as: AMBIEN  Take 10 mg by mouth at bedtime.       Allergies  Allergen Reactions   Doxycycline  Diarrhea and Nausea And Vomiting   Hydrocodone  Nausea Only   Hyoscyamine  Hives   Cyclobenzaprine  Hives   Lamotrigine Hives   Nickel Hives and Rash   Tramadol  Hives and Diarrhea      The results of significant diagnostics from this hospitalization (including  imaging, microbiology, ancillary and laboratory) are listed below for reference.    Significant Diagnostic Studies: CT ABDOMEN PELVIS W CONTRAST Result Date: 10/21/2024 CLINICAL DATA:  Abdominal pain with nausea, vomiting and diarrhea. EXAM: CT ABDOMEN AND PELVIS WITH CONTRAST TECHNIQUE: Multidetector CT imaging of the abdomen and pelvis was performed using the standard protocol following bolus administration of intravenous contrast. RADIATION DOSE REDUCTION: This exam was performed according to the departmental dose-optimization program which includes automated exposure control, adjustment of the mA and/or kV according to patient size and/or use of iterative reconstruction technique. CONTRAST:  OMNIPAQUE  IOHEXOL  300 MG/ML  SOLN COMPARISON:  October 10, 2024 FINDINGS: Lower chest: No acute abnormality. Hepatobiliary: There is diffuse fatty infiltration of the liver parenchyma. No focal liver abnormality is seen. No gallstones, gallbladder wall thickening, or biliary dilatation. Pancreas: Unremarkable. No pancreatic ductal dilatation or surrounding inflammatory changes. Spleen: Normal in size without focal abnormality. Adrenals/Urinary Tract: Adrenal glands are unremarkable. Kidneys are normal, without renal calculi, focal lesion, or hydronephrosis. Bladder is unremarkable. Stomach/Bowel: Stomach is within  normal limits. Appendix appears normal. No evidence of bowel dilatation. Mildly thickened, completely decompressed transverse, descending and sigmoid colon is seen. Vascular/Lymphatic: No significant vascular findings are present. No enlarged abdominal or pelvic lymph nodes. Reproductive: A stable 1.6 cm right ovarian dermoid is noted. The uterus and left adnexa are unremarkable. Other: No abdominal wall hernia or abnormality. No abdominopelvic ascites. Musculoskeletal: A stable, chronic benign-appearing bone lesion with sclerotic rim is seen within the right iliac bone. No acute osseous abnormalities  are identified IMPRESSION: 1. Mildly thickened, completely decompressed transverse, descending and sigmoid colon, which may represent sequelae associated with colitis. 2. Hepatic steatosis. 3. Stable right ovarian dermoid. Electronically Signed   By: Suzen Dials M.D.   On: 10/21/2024 15:52   CT ABDOMEN PELVIS W CONTRAST Result Date: 10/10/2024 EXAM: CT ABDOMEN AND PELVIS WITH CONTRAST 10/10/2024 06:09:49 AM TECHNIQUE: CT of the abdomen and pelvis was performed with the administration of 75 mL of iohexol  (OMNIPAQUE ) 350 MG/ML injection. Multiplanar reformatted images are provided for review. Automated exposure control, iterative reconstruction, and/or weight-based adjustment of the mA/kV was utilized to reduce the radiation dose to as low as reasonably achievable. COMPARISON: CT abdomen and pelvis 04/07/2024. CLINICAL HISTORY: 43 year old female. RLQ abdominal pain. FINDINGS: LOWER CHEST: Visible lower chest is negative. LIVER: The liver is unremarkable. GALLBLADDER AND BILE DUCTS: Gallbladder is unremarkable. No biliary ductal dilatation. SPLEEN: Small splenules, normal variant. PANCREAS: No acute abnormality. ADRENAL GLANDS: No acute abnormality. KIDNEYS, URETERS AND BLADDER: No stones in the kidneys or ureters. No hydronephrosis. No perinephric or periureteral stranding. Diminutive bladder. GI AND BOWEL: Stomach decompressed. Most small bowel loops are decompressed, not containing fluid. Fluid throughout nondilated large bowel, with intermittent mild mucosal hyperenhancement throughout the colon. Appearance compatible with diarrhea, mild enterocolitis. Normal gas containing appendix (series 3, image 44). No dilated bowel. PERITONEUM AND RETROPERITONEUM: No ascites. No free air. VASCULATURE: Aorta is normal in caliber. Major arterial structures and the main portal venous system appear patent and within normal limits. LYMPH NODES: No lymphadenopathy. REPRODUCTIVE ORGANS: Subtle macroscopic fat containing  1.9 cm right adnexal lesion is suspicious for small chronic ovarian dermoid (series 3, image 63 and coronal image 30). BONES AND SOFT TISSUES: Chronic benign sclerotic rim to bone lesion of the medial right iliac bone is unchanged compared to a 2023 CT abdomen and pelvis. No acute osseous abnormality. No focal soft tissue abnormality. No other acute or inflammatory process identified in the abdomen or pelvis. IMPRESSION: 1. Fluid throughout nondilated large bowel with intermittent mild mucosal hyperenhancement, compatible with diarrhea, mild enterocolitis. 2. No other acute or inflammatory process identified.  Normal appendix. 3. Evidence of a 1.9 cm chronic right ovarian dermoid. Recommend follow up with GYN. Electronically signed by: Helayne Hurst MD 10/10/2024 06:38 AM EST RP Workstation: [id number]    Microbiology: Recent Results (from the past 240 hours)  Culture, blood (Routine X 2) w Reflex to ID Panel     Status: Abnormal   Collection Time: 10/21/24  2:00 PM   Specimen: BLOOD RIGHT ARM  Result Value Ref Range Status   Specimen Description   Final    BLOOD RIGHT ARM Performed at [facility], [address] [place], [place] [address]    Special Requests   Final    BOTTLES DRAWN AEROBIC AND ANAEROBIC Blood Culture results may not be optimal due to an inadequate volume of blood received in culture bottles Performed at [facility], [address], [place], [place] [address]  Culture  Setup Time   Final    GRAM POSITIVE COCCI IN CLUSTERS IN BOTH AEROBIC AND ANAEROBIC BOTTLES CRITICAL RESULT CALLED TO, READ BACK BY AND VERIFIED WITH: PHARMD N.GLOGOVAC AT 1020 ON 10/22/2024 BY T.SAAD.    Culture (A)  Final    STAPHYLOCOCCUS HOMINIS THE SIGNIFICANCE OF ISOLATING THIS ORGANISM FROM A SINGLE SET OF BLOOD CULTURES WHEN MULTIPLE SETS ARE DRAWN IS UNCERTAIN. PLEASE NOTIFY THE MICROBIOLOGY DEPARTMENT WITHIN ONE WEEK IF SPECIATION AND SENSITIVITIES ARE REQUIRED. Performed  at [facility], [address], [place], [place] [address]    Report Status 10/24/2024 FINAL  Final  Blood Culture ID Panel (Reflexed)     Status: Abnormal   Collection Time: 10/21/24  2:00 PM  Result Value Ref Range Status   Enterococcus faecalis NOT DETECTED NOT DETECTED Final   Enterococcus Faecium NOT DETECTED NOT DETECTED Final   Listeria monocytogenes NOT DETECTED NOT DETECTED Final   Staphylococcus species DETECTED (A) NOT DETECTED Final    Comment: CRITICAL RESULT CALLED TO, READ BACK BY AND VERIFIED WITH: PHARMD N.GLOGOVAC AT 1020 ON 10/22/2024 BY T.SAAD.    Staphylococcus aureus (BCID) NOT DETECTED NOT DETECTED Final   Staphylococcus epidermidis NOT DETECTED NOT DETECTED Final   Staphylococcus lugdunensis NOT DETECTED NOT DETECTED Final   Streptococcus species NOT DETECTED NOT DETECTED Final   Streptococcus agalactiae NOT DETECTED NOT DETECTED Final   Streptococcus pneumoniae NOT DETECTED NOT DETECTED Final   Streptococcus pyogenes NOT DETECTED NOT DETECTED Final   A.calcoaceticus-baumannii NOT DETECTED NOT DETECTED Final   Bacteroides fragilis NOT DETECTED NOT DETECTED Final   Enterobacterales NOT DETECTED NOT DETECTED Final   Enterobacter cloacae complex NOT DETECTED NOT DETECTED Final   Escherichia coli NOT DETECTED NOT DETECTED Final   Klebsiella aerogenes NOT DETECTED NOT DETECTED Final   Klebsiella oxytoca NOT DETECTED NOT DETECTED Final   Klebsiella pneumoniae NOT DETECTED NOT DETECTED Final   Proteus species NOT DETECTED NOT DETECTED Final   Salmonella species NOT DETECTED NOT DETECTED Final   Serratia marcescens NOT DETECTED NOT DETECTED Final   Haemophilus influenzae NOT DETECTED NOT DETECTED Final   Neisseria meningitidis NOT DETECTED NOT DETECTED Final   Pseudomonas aeruginosa NOT DETECTED NOT DETECTED Final   Stenotrophomonas maltophilia NOT DETECTED NOT DETECTED Final   Candida albicans NOT DETECTED NOT DETECTED Final   Candida auris NOT DETECTED NOT  DETECTED Final   Candida glabrata NOT DETECTED NOT DETECTED Final   Candida krusei NOT DETECTED NOT DETECTED Final   Candida parapsilosis NOT DETECTED NOT DETECTED Final   Candida tropicalis NOT DETECTED NOT DETECTED Final   Cryptococcus neoformans/gattii NOT DETECTED NOT DETECTED Final    Comment: Performed at [facility], [address], [place], [place] [address]  Culture, blood (Routine X 2) w Reflex to ID Panel     Status: None (Preliminary result)   Collection Time: 10/21/24  9:16 PM   Specimen: BLOOD  Result Value Ref Range Status   Specimen Description   Final    BLOOD BLOOD LEFT HAND Performed at [facility], [address] [place], [place] [address]    Special Requests   Final    BOTTLES DRAWN AEROBIC AND ANAEROBIC Blood Culture results may not be optimal due to an inadequate volume of blood received in culture bottles Performed at [facility], [address] [place], [place] [address]    Culture   Final    NO GROWTH 2 DAYS Performed at [facility], [address], [place], [place] [address]    Report Status PENDING  Incomplete  Gastrointestinal Panel by PCR , Stool     Status: None   Collection Time: 10/22/24 10:45 AM   Specimen: Stool  Result Value Ref Range Status   Campylobacter species NOT DETECTED NOT DETECTED Final   Plesimonas shigelloides NOT DETECTED NOT DETECTED Final   Salmonella species NOT DETECTED NOT DETECTED Final   Yersinia enterocolitica NOT DETECTED NOT DETECTED Final   Vibrio species NOT DETECTED NOT DETECTED Final   Vibrio cholerae NOT DETECTED NOT DETECTED Final   Enteroaggregative E coli (EAEC) NOT DETECTED NOT DETECTED Final   Enteropathogenic E coli (EPEC) NOT DETECTED NOT DETECTED Final   Enterotoxigenic E coli (ETEC) NOT DETECTED NOT DETECTED Final   Shiga like toxin producing E coli (STEC) NOT DETECTED NOT DETECTED Final   Shigella/Enteroinvasive E coli (EIEC) NOT DETECTED NOT DETECTED  Final   Cryptosporidium NOT DETECTED NOT DETECTED Final   Cyclospora cayetanensis NOT DETECTED NOT DETECTED Final   Entamoeba histolytica NOT DETECTED NOT DETECTED Final   Giardia lamblia NOT DETECTED NOT DETECTED Final   Adenovirus F40/41 NOT DETECTED NOT DETECTED Final   Astrovirus NOT DETECTED NOT DETECTED Final   Norovirus GI/GII NOT DETECTED NOT DETECTED Final   Rotavirus A NOT DETECTED NOT DETECTED Final   Sapovirus (I, II, IV, and V) NOT DETECTED NOT DETECTED Final    Comment: Performed at [facility], [address] [place], [place] [address]     Labs: Basic Metabolic Panel: Recent Labs  Lab 10/21/24 1207 10/22/24 0526 10/23/24 0520  NA 140 141 142  K 4.0 3.7 3.4*  CL 107 106 103  CO2 20* 25 27  GLUCOSE 193* 79 73  BUN <5* <5* <5*  CREATININE 0.72 0.50 0.61  CALCIUM 9.9 9.3 10.1   Liver Function Tests: Recent Labs  Lab 10/21/24 1207 10/22/24 0526 10/23/24 0520  AST 33 20 20  ALT 38 27 29  ALKPHOS 95 71 82  BILITOT 0.2 0.2 0.3  PROT 7.6 6.3* 7.8  ALBUMIN 4.4 3.7 4.6   Recent Labs  Lab 10/21/24 1207  LIPASE 18   No results for input(s): AMMONIA in the last 168 hours. CBC: Recent Labs  Lab 10/21/24 1207 10/22/24 0526 10/23/24 0520  WBC 21.1* 8.4 7.5  HGB 14.8 11.8* 14.3  HCT 44.5 35.6* 43.8  MCV 92.1 90.8 90.7  PLT 328 278 314   Cardiac Enzymes: No results for input(s): CKTOTAL, CKMB, CKMBINDEX, TROPONINI in the last 168 hours. BNP: BNP (last 3 results) No results for input(s): BNP in the last 8760 hours.  ProBNP (last 3 results) No results for input(s): PROBNP in the last 8760 hours.  CBG: No results for input(s): GLUCAP in the last 168 hours.     Signed:  Sigurd Pac MD.  [facility] 10/24/2024, 9:28 AM

## 2024-10-24 NOTE — Telephone Encounter (Signed)
 Patient requesting f/u call to discuss symptoms she is having. Please advise.

## 2024-10-24 NOTE — Telephone Encounter (Signed)
 Patient was discharged from [facility] earlier today, after a 2 day admission for colitis. She was prescribed a 3 day course of ceftin & flagyl , and was advised to take 500 mg of tylenol  PRN. Patient is complaining of abdominal pain 8/10. Nausea relieved with phenergan . Still having loose stools. She was able to tolerate gingerale & saltine crackers earlier. States tylenol  is not helping & she needs further medication. She was previously taking morphine  & dilaudid  in the hospital for relief. She is scheduled for a follow up with [facility] on 11/07/24. Advised her I'd reach out to provider & will be back in touch. Discussed ED precautions in the meantime.

## 2024-10-25 ENCOUNTER — Ambulatory Visit: Payer: Self-pay | Admitting: Obstetrics and Gynecology

## 2024-10-25 LAB — HEMOGLOBIN A1C
Est. average glucose Bld gHb Est-mCnc: 120 mg/dL
Hgb A1c MFr Bld: 5.8 % — ABNORMAL HIGH (ref 4.8–5.6)

## 2024-10-25 LAB — FOLLICLE STIMULATING HORMONE: FSH: 91.8 m[IU]/mL

## 2024-10-28 LAB — CULTURE, BLOOD (ROUTINE X 2): Culture: NO GROWTH

## 2024-10-28 NOTE — Telephone Encounter (Signed)
 DOD Her colitis appears to have resolved.  Tolerating diet well I do not think she needs any narcotics If the pain significantly gets worse, then she needs to be reevaluated in ED. We certainly do not want to mask her symptoms RG

## 2024-10-29 ENCOUNTER — Other Ambulatory Visit: Payer: Self-pay

## 2024-10-29 DIAGNOSIS — I2510 Atherosclerotic heart disease of native coronary artery without angina pectoris: Secondary | ICD-10-CM

## 2024-10-29 DIAGNOSIS — R072 Precordial pain: Secondary | ICD-10-CM

## 2024-10-29 NOTE — Telephone Encounter (Signed)
 Left message for patient to call back

## 2024-11-07 ENCOUNTER — Ambulatory Visit: Admitting: Gastroenterology

## 2024-11-08 ENCOUNTER — Other Ambulatory Visit

## 2024-11-08 NOTE — Telephone Encounter (Signed)
 Left message for patient to call back. Patient was also scheduled for hospital f/u yesterday, and no showed.

## 2024-11-11 DIAGNOSIS — F331 Major depressive disorder, recurrent, moderate: Secondary | ICD-10-CM | POA: Diagnosis not present

## 2024-11-11 DIAGNOSIS — F411 Generalized anxiety disorder: Secondary | ICD-10-CM | POA: Diagnosis not present

## 2024-11-14 NOTE — Telephone Encounter (Signed)
 Spoke with patient & she says symptoms have resolved and she is doing much better. She would like to reschedule her hospital f/u with [facility]. OV is now 12/23 at 3:20 pm.

## 2024-11-26 ENCOUNTER — Ambulatory Visit: Admitting: Gastroenterology

## 2024-12-18 ENCOUNTER — Other Ambulatory Visit: Payer: Self-pay | Admitting: Lactation Services

## 2024-12-18 DIAGNOSIS — N951 Menopausal and female climacteric states: Secondary | ICD-10-CM

## 2025-01-01 ENCOUNTER — Ambulatory Visit: Payer: Self-pay

## 2025-01-08 ENCOUNTER — Ambulatory Visit

## 2025-02-06 ENCOUNTER — Other Ambulatory Visit (HOSPITAL_BASED_OUTPATIENT_CLINIC_OR_DEPARTMENT_OTHER)
